# Patient Record
Sex: Female | Born: 1940 | Race: White | Hispanic: No | Marital: Married | Smoking: Never smoker
Health system: Southern US, Community
[De-identification: ages and names within clinical notes are randomized; demographics above are authoritative.]

## PROBLEM LIST (undated history)

## (undated) DIAGNOSIS — B029 Zoster without complications: Secondary | ICD-10-CM

## (undated) DIAGNOSIS — R112 Nausea with vomiting, unspecified: Secondary | ICD-10-CM

## (undated) DIAGNOSIS — R200 Anesthesia of skin: Secondary | ICD-10-CM

## (undated) DIAGNOSIS — Z96659 Presence of unspecified artificial knee joint: Secondary | ICD-10-CM

## (undated) DIAGNOSIS — R894 Abnormal immunological findings in specimens from other organs, systems and tissues: Secondary | ICD-10-CM

## (undated) DIAGNOSIS — G8929 Other chronic pain: Secondary | ICD-10-CM

## (undated) DIAGNOSIS — Z8619 Personal history of other infectious and parasitic diseases: Secondary | ICD-10-CM

## (undated) DIAGNOSIS — R32 Unspecified urinary incontinence: Secondary | ICD-10-CM

## (undated) DIAGNOSIS — M2170 Unequal limb length (acquired), unspecified site: Secondary | ICD-10-CM

## (undated) DIAGNOSIS — O021 Missed abortion: Secondary | ICD-10-CM

## (undated) DIAGNOSIS — Z5189 Encounter for other specified aftercare: Secondary | ICD-10-CM

## (undated) DIAGNOSIS — E785 Hyperlipidemia, unspecified: Secondary | ICD-10-CM

## (undated) DIAGNOSIS — R5382 Chronic fatigue, unspecified: Secondary | ICD-10-CM

## (undated) DIAGNOSIS — N8110 Cystocele, unspecified: Secondary | ICD-10-CM

## (undated) DIAGNOSIS — H9319 Tinnitus, unspecified ear: Secondary | ICD-10-CM

## (undated) DIAGNOSIS — I10 Essential (primary) hypertension: Secondary | ICD-10-CM

## (undated) DIAGNOSIS — H269 Unspecified cataract: Secondary | ICD-10-CM

## (undated) DIAGNOSIS — R2689 Other abnormalities of gait and mobility: Secondary | ICD-10-CM

## (undated) DIAGNOSIS — G709 Myoneural disorder, unspecified: Secondary | ICD-10-CM

## (undated) DIAGNOSIS — IMO0002 Reserved for concepts with insufficient information to code with codable children: Secondary | ICD-10-CM

## (undated) DIAGNOSIS — M65332 Trigger finger, left middle finger: Secondary | ICD-10-CM

## (undated) DIAGNOSIS — T7840XA Allergy, unspecified, initial encounter: Secondary | ICD-10-CM

## (undated) DIAGNOSIS — Z780 Asymptomatic menopausal state: Secondary | ICD-10-CM

## (undated) DIAGNOSIS — J40 Bronchitis, not specified as acute or chronic: Secondary | ICD-10-CM

## (undated) DIAGNOSIS — J329 Chronic sinusitis, unspecified: Secondary | ICD-10-CM

## (undated) DIAGNOSIS — M2142 Flat foot [pes planus] (acquired), left foot: Secondary | ICD-10-CM

## (undated) DIAGNOSIS — L500 Allergic urticaria: Secondary | ICD-10-CM

## (undated) DIAGNOSIS — M7742 Metatarsalgia, left foot: Secondary | ICD-10-CM

## (undated) DIAGNOSIS — M1990 Unspecified osteoarthritis, unspecified site: Secondary | ICD-10-CM

## (undated) DIAGNOSIS — M109 Gout, unspecified: Secondary | ICD-10-CM

## (undated) DIAGNOSIS — D649 Anemia, unspecified: Secondary | ICD-10-CM

## (undated) DIAGNOSIS — H3530 Unspecified macular degeneration: Secondary | ICD-10-CM

## (undated) DIAGNOSIS — G9332 Myalgic encephalomyelitis/chronic fatigue syndrome: Secondary | ICD-10-CM

## (undated) DIAGNOSIS — S60222A Contusion of left hand, initial encounter: Secondary | ICD-10-CM

## (undated) DIAGNOSIS — Z9889 Other specified postprocedural states: Secondary | ICD-10-CM

## (undated) DIAGNOSIS — T84038A Mechanical loosening of other internal prosthetic joint, initial encounter: Secondary | ICD-10-CM

## (undated) DIAGNOSIS — J189 Pneumonia, unspecified organism: Secondary | ICD-10-CM

## (undated) DIAGNOSIS — J302 Other seasonal allergic rhinitis: Secondary | ICD-10-CM

## (undated) DIAGNOSIS — M2020 Hallux rigidus, unspecified foot: Secondary | ICD-10-CM

## (undated) DIAGNOSIS — S92309A Fracture of unspecified metatarsal bone(s), unspecified foot, initial encounter for closed fracture: Secondary | ICD-10-CM

## (undated) DIAGNOSIS — M8580 Other specified disorders of bone density and structure, unspecified site: Secondary | ICD-10-CM

## (undated) DIAGNOSIS — E039 Hypothyroidism, unspecified: Secondary | ICD-10-CM

## (undated) HISTORY — PX: BACK SURGERY: SHX140

## (undated) HISTORY — PX: TUBAL LIGATION: SHX77

## (undated) HISTORY — PX: TOTAL KNEE ARTHROPLASTY: SHX125

## (undated) HISTORY — PX: BUNIONECTOMY: SHX129

## (undated) HISTORY — PX: OTHER SURGICAL HISTORY: SHX169

## (undated) HISTORY — DX: Encounter for other specified aftercare: Z51.89

## (undated) HISTORY — PX: APPENDECTOMY: SHX54

## (undated) HISTORY — PX: CARPAL TUNNEL RELEASE: SHX101

## (undated) HISTORY — DX: Gout, unspecified: M10.9

## (undated) HISTORY — PX: JOINT REPLACEMENT: SHX530

## (undated) HISTORY — PX: DILATION AND CURETTAGE OF UTERUS: SHX78

## (undated) HISTORY — PX: BREAST SURGERY: SHX581

## (undated) HISTORY — DX: Unspecified macular degeneration: H35.30

## (undated) HISTORY — DX: Hyperlipidemia, unspecified: E78.5

## (undated) HISTORY — DX: Unspecified cataract: H26.9

## (undated) HISTORY — DX: Allergy, unspecified, initial encounter: T78.40XA

## (undated) HISTORY — DX: Other specified disorders of bone density and structure, unspecified site: M85.80

---

## 1996-12-06 HISTORY — PX: BREAST EXCISIONAL BIOPSY: SUR124

## 1998-10-09 ENCOUNTER — Ambulatory Visit (HOSPITAL_COMMUNITY): Admission: RE | Admit: 1998-10-09 | Discharge: 1998-10-09 | Payer: Self-pay

## 1998-10-09 ENCOUNTER — Encounter: Payer: Self-pay | Admitting: Family Medicine

## 1999-05-07 HISTORY — PX: LUMBAR LAMINECTOMY: SHX95

## 1999-05-25 ENCOUNTER — Inpatient Hospital Stay (HOSPITAL_COMMUNITY): Admission: RE | Admit: 1999-05-25 | Discharge: 1999-05-31 | Payer: Self-pay | Admitting: Neurosurgery

## 1999-06-08 ENCOUNTER — Emergency Department (HOSPITAL_COMMUNITY): Admission: EM | Admit: 1999-06-08 | Discharge: 1999-06-08 | Payer: Self-pay | Admitting: Emergency Medicine

## 1999-06-29 ENCOUNTER — Inpatient Hospital Stay (HOSPITAL_COMMUNITY): Admission: AD | Admit: 1999-06-29 | Discharge: 1999-07-03 | Payer: Self-pay | Admitting: Neurosurgery

## 1999-10-20 ENCOUNTER — Other Ambulatory Visit: Admission: RE | Admit: 1999-10-20 | Discharge: 1999-10-20 | Payer: Self-pay | Admitting: Obstetrics and Gynecology

## 1999-11-13 ENCOUNTER — Ambulatory Visit (HOSPITAL_COMMUNITY): Admission: RE | Admit: 1999-11-13 | Discharge: 1999-11-13 | Payer: Self-pay | Admitting: Obstetrics and Gynecology

## 1999-11-13 ENCOUNTER — Encounter: Payer: Self-pay | Admitting: Obstetrics and Gynecology

## 2000-02-03 ENCOUNTER — Encounter (INDEPENDENT_AMBULATORY_CARE_PROVIDER_SITE_OTHER): Payer: Self-pay

## 2000-02-03 ENCOUNTER — Other Ambulatory Visit: Admission: RE | Admit: 2000-02-03 | Discharge: 2000-02-03 | Payer: Self-pay | Admitting: Obstetrics and Gynecology

## 2000-03-15 ENCOUNTER — Encounter: Admission: RE | Admit: 2000-03-15 | Discharge: 2000-03-15 | Payer: Self-pay | Admitting: Neurosurgery

## 2000-03-30 ENCOUNTER — Inpatient Hospital Stay (HOSPITAL_COMMUNITY): Admission: RE | Admit: 2000-03-30 | Discharge: 2000-04-05 | Payer: Self-pay | Admitting: Neurosurgery

## 2000-03-30 ENCOUNTER — Encounter (INDEPENDENT_AMBULATORY_CARE_PROVIDER_SITE_OTHER): Payer: Self-pay | Admitting: *Deleted

## 2000-04-20 ENCOUNTER — Encounter: Admission: RE | Admit: 2000-04-20 | Discharge: 2000-04-20 | Payer: Self-pay | Admitting: Neurosurgery

## 2000-06-10 ENCOUNTER — Encounter: Admission: RE | Admit: 2000-06-10 | Discharge: 2000-06-10 | Payer: Self-pay | Admitting: Neurosurgery

## 2000-08-09 ENCOUNTER — Encounter: Admission: RE | Admit: 2000-08-09 | Discharge: 2000-08-09 | Payer: Self-pay | Admitting: Neurosurgery

## 2000-09-08 ENCOUNTER — Encounter: Admission: RE | Admit: 2000-09-08 | Discharge: 2000-09-08 | Payer: Self-pay | Admitting: Obstetrics and Gynecology

## 2000-09-08 ENCOUNTER — Encounter: Payer: Self-pay | Admitting: Obstetrics and Gynecology

## 2000-09-13 ENCOUNTER — Encounter: Admission: RE | Admit: 2000-09-13 | Discharge: 2000-09-13 | Payer: Self-pay | Admitting: Neurosurgery

## 2000-10-10 ENCOUNTER — Other Ambulatory Visit: Admission: RE | Admit: 2000-10-10 | Discharge: 2000-10-10 | Payer: Self-pay | Admitting: Obstetrics and Gynecology

## 2000-11-01 ENCOUNTER — Encounter: Admission: RE | Admit: 2000-11-01 | Discharge: 2000-11-01 | Payer: Self-pay | Admitting: Neurosurgery

## 2000-11-14 ENCOUNTER — Encounter: Payer: Self-pay | Admitting: Obstetrics and Gynecology

## 2000-11-14 ENCOUNTER — Ambulatory Visit (HOSPITAL_COMMUNITY): Admission: RE | Admit: 2000-11-14 | Discharge: 2000-11-14 | Payer: Self-pay | Admitting: Obstetrics and Gynecology

## 2000-12-09 ENCOUNTER — Encounter: Payer: Self-pay | Admitting: Neurosurgery

## 2000-12-09 ENCOUNTER — Ambulatory Visit (HOSPITAL_COMMUNITY): Admission: RE | Admit: 2000-12-09 | Discharge: 2000-12-09 | Payer: Self-pay | Admitting: Neurosurgery

## 2001-04-14 ENCOUNTER — Encounter: Admission: RE | Admit: 2001-04-14 | Discharge: 2001-04-14 | Payer: Self-pay | Admitting: Neurosurgery

## 2001-06-23 ENCOUNTER — Encounter: Admission: RE | Admit: 2001-06-23 | Discharge: 2001-08-05 | Payer: Self-pay | Admitting: Anesthesiology

## 2001-07-18 ENCOUNTER — Encounter: Admission: RE | Admit: 2001-07-18 | Discharge: 2001-07-18 | Payer: Self-pay | Admitting: Neurosurgery

## 2001-08-04 ENCOUNTER — Ambulatory Visit (HOSPITAL_COMMUNITY): Admission: RE | Admit: 2001-08-04 | Discharge: 2001-08-04 | Payer: Self-pay | Admitting: Neurosurgery

## 2001-11-10 ENCOUNTER — Other Ambulatory Visit: Admission: RE | Admit: 2001-11-10 | Discharge: 2001-11-10 | Payer: Self-pay | Admitting: Obstetrics and Gynecology

## 2002-01-04 ENCOUNTER — Ambulatory Visit (HOSPITAL_COMMUNITY): Admission: RE | Admit: 2002-01-04 | Discharge: 2002-01-04 | Payer: Self-pay | Admitting: Neurosurgery

## 2002-01-16 ENCOUNTER — Encounter: Payer: Self-pay | Admitting: Obstetrics and Gynecology

## 2002-01-16 ENCOUNTER — Ambulatory Visit (HOSPITAL_COMMUNITY): Admission: RE | Admit: 2002-01-16 | Discharge: 2002-01-16 | Payer: Self-pay | Admitting: Obstetrics and Gynecology

## 2002-02-19 ENCOUNTER — Ambulatory Visit (HOSPITAL_COMMUNITY): Admission: RE | Admit: 2002-02-19 | Discharge: 2002-02-19 | Payer: Self-pay | Admitting: Neurosurgery

## 2002-11-19 ENCOUNTER — Other Ambulatory Visit: Admission: RE | Admit: 2002-11-19 | Discharge: 2002-11-19 | Payer: Self-pay | Admitting: Obstetrics and Gynecology

## 2002-12-11 ENCOUNTER — Encounter: Admission: RE | Admit: 2002-12-11 | Discharge: 2002-12-11 | Payer: Self-pay | Admitting: Obstetrics and Gynecology

## 2002-12-11 ENCOUNTER — Encounter: Payer: Self-pay | Admitting: Obstetrics and Gynecology

## 2003-01-17 ENCOUNTER — Ambulatory Visit (HOSPITAL_COMMUNITY): Admission: RE | Admit: 2003-01-17 | Discharge: 2003-01-17 | Payer: Self-pay | Admitting: Obstetrics and Gynecology

## 2003-01-17 ENCOUNTER — Encounter: Payer: Self-pay | Admitting: Obstetrics and Gynecology

## 2004-05-12 ENCOUNTER — Ambulatory Visit (HOSPITAL_COMMUNITY): Admission: RE | Admit: 2004-05-12 | Discharge: 2004-05-12 | Payer: Self-pay | Admitting: Anesthesiology

## 2004-08-03 ENCOUNTER — Other Ambulatory Visit: Admission: RE | Admit: 2004-08-03 | Discharge: 2004-08-03 | Payer: Self-pay | Admitting: Obstetrics and Gynecology

## 2004-08-20 ENCOUNTER — Ambulatory Visit (HOSPITAL_COMMUNITY): Admission: RE | Admit: 2004-08-20 | Discharge: 2004-08-20 | Payer: Self-pay | Admitting: Obstetrics and Gynecology

## 2004-11-24 ENCOUNTER — Observation Stay (HOSPITAL_COMMUNITY): Admission: EM | Admit: 2004-11-24 | Discharge: 2004-11-25 | Payer: Self-pay | Admitting: *Deleted

## 2004-11-25 ENCOUNTER — Encounter (INDEPENDENT_AMBULATORY_CARE_PROVIDER_SITE_OTHER): Payer: Self-pay | Admitting: *Deleted

## 2005-02-15 ENCOUNTER — Ambulatory Visit (HOSPITAL_COMMUNITY): Admission: RE | Admit: 2005-02-15 | Discharge: 2005-02-15 | Payer: Self-pay | Admitting: *Deleted

## 2005-02-15 ENCOUNTER — Encounter (INDEPENDENT_AMBULATORY_CARE_PROVIDER_SITE_OTHER): Payer: Self-pay | Admitting: *Deleted

## 2005-05-24 ENCOUNTER — Ambulatory Visit (HOSPITAL_COMMUNITY): Admission: RE | Admit: 2005-05-24 | Discharge: 2005-05-24 | Payer: Self-pay | Admitting: *Deleted

## 2005-11-03 ENCOUNTER — Inpatient Hospital Stay (HOSPITAL_COMMUNITY): Admission: RE | Admit: 2005-11-03 | Discharge: 2005-11-04 | Payer: Self-pay | Admitting: Orthopedic Surgery

## 2005-11-18 ENCOUNTER — Other Ambulatory Visit: Admission: RE | Admit: 2005-11-18 | Discharge: 2005-11-18 | Payer: Self-pay | Admitting: Obstetrics and Gynecology

## 2005-11-18 ENCOUNTER — Ambulatory Visit (HOSPITAL_COMMUNITY): Admission: RE | Admit: 2005-11-18 | Discharge: 2005-11-18 | Payer: Self-pay | Admitting: Obstetrics and Gynecology

## 2005-12-06 HISTORY — PX: KNEE ARTHROSCOPY: SUR90

## 2006-09-20 ENCOUNTER — Encounter: Admission: RE | Admit: 2006-09-20 | Discharge: 2006-09-20 | Payer: Self-pay | Admitting: Endocrinology

## 2006-12-01 ENCOUNTER — Ambulatory Visit (HOSPITAL_COMMUNITY): Admission: RE | Admit: 2006-12-01 | Discharge: 2006-12-01 | Payer: Self-pay | Admitting: Obstetrics & Gynecology

## 2006-12-06 HISTORY — PX: THUMB FUSION: SUR636

## 2006-12-30 ENCOUNTER — Other Ambulatory Visit: Admission: RE | Admit: 2006-12-30 | Discharge: 2006-12-30 | Payer: Self-pay | Admitting: Obstetrics & Gynecology

## 2007-02-12 ENCOUNTER — Emergency Department (HOSPITAL_COMMUNITY): Admission: EM | Admit: 2007-02-12 | Discharge: 2007-02-12 | Payer: Self-pay | Admitting: Emergency Medicine

## 2007-12-06 ENCOUNTER — Ambulatory Visit (HOSPITAL_COMMUNITY): Admission: RE | Admit: 2007-12-06 | Discharge: 2007-12-06 | Payer: Self-pay | Admitting: Obstetrics & Gynecology

## 2008-02-09 ENCOUNTER — Other Ambulatory Visit: Admission: RE | Admit: 2008-02-09 | Discharge: 2008-02-09 | Payer: Self-pay | Admitting: Obstetrics & Gynecology

## 2008-07-24 ENCOUNTER — Encounter (INDEPENDENT_AMBULATORY_CARE_PROVIDER_SITE_OTHER): Payer: Self-pay | Admitting: Neurosurgery

## 2008-07-24 ENCOUNTER — Inpatient Hospital Stay (HOSPITAL_COMMUNITY): Admission: AD | Admit: 2008-07-24 | Discharge: 2008-07-25 | Payer: Self-pay | Admitting: Neurosurgery

## 2008-10-11 ENCOUNTER — Encounter: Admission: RE | Admit: 2008-10-11 | Discharge: 2008-10-11 | Payer: Self-pay | Admitting: Endocrinology

## 2008-12-06 DIAGNOSIS — IMO0001 Reserved for inherently not codable concepts without codable children: Secondary | ICD-10-CM

## 2008-12-06 HISTORY — DX: Reserved for inherently not codable concepts without codable children: IMO0001

## 2009-02-14 ENCOUNTER — Other Ambulatory Visit: Admission: RE | Admit: 2009-02-14 | Discharge: 2009-02-14 | Payer: Self-pay | Admitting: Obstetrics & Gynecology

## 2009-07-24 ENCOUNTER — Inpatient Hospital Stay (HOSPITAL_COMMUNITY): Admission: RE | Admit: 2009-07-24 | Discharge: 2009-07-29 | Payer: Self-pay | Admitting: Neurosurgery

## 2010-03-10 ENCOUNTER — Encounter: Admission: RE | Admit: 2010-03-10 | Discharge: 2010-03-10 | Payer: Self-pay | Admitting: Obstetrics & Gynecology

## 2010-09-10 ENCOUNTER — Encounter: Admission: RE | Admit: 2010-09-10 | Discharge: 2010-09-10 | Payer: Self-pay | Admitting: Obstetrics & Gynecology

## 2010-11-17 ENCOUNTER — Ambulatory Visit (HOSPITAL_COMMUNITY)
Admission: RE | Admit: 2010-11-17 | Discharge: 2010-11-17 | Payer: Self-pay | Source: Home / Self Care | Attending: Neurosurgery | Admitting: Neurosurgery

## 2010-12-06 HISTORY — PX: OTHER SURGICAL HISTORY: SHX169

## 2010-12-27 ENCOUNTER — Encounter: Payer: Self-pay | Admitting: Endocrinology

## 2011-02-04 HISTORY — PX: METATARSAL OSTEOTOMY: SHX1641

## 2011-02-26 ENCOUNTER — Encounter (HOSPITAL_BASED_OUTPATIENT_CLINIC_OR_DEPARTMENT_OTHER)
Admission: RE | Admit: 2011-02-26 | Discharge: 2011-02-26 | Disposition: A | Discharge status: Home / Self Care | Payer: Medicare Other | Source: Ambulatory Visit | Attending: Orthopaedic Surgery | Admitting: Orthopaedic Surgery

## 2011-02-26 LAB — BASIC METABOLIC PANEL
BUN: 17 mg/dL (ref 6–23)
CO2: 31 mEq/L (ref 19–32)
Chloride: 103 mEq/L (ref 96–112)
GFR calc non Af Amer: >60 mL/min (ref >60)
Glucose, Bld: 120 mg/dL — ABNORMAL HIGH (ref 70–99)
Potassium: 3.5 mEq/L (ref 3.5–5.1)
Sodium: 140 mEq/L (ref 135–145)

## 2011-03-02 ENCOUNTER — Ambulatory Visit (HOSPITAL_BASED_OUTPATIENT_CLINIC_OR_DEPARTMENT_OTHER)
Admission: RE | Admit: 2011-03-02 | Discharge: 2011-03-02 | Disposition: A | Discharge status: Home / Self Care | Payer: Medicare Other | Source: Ambulatory Visit | Attending: Orthopaedic Surgery | Admitting: Orthopaedic Surgery

## 2011-03-02 DIAGNOSIS — Z01818 Encounter for other preprocedural examination: Secondary | ICD-10-CM | POA: Insufficient documentation

## 2011-03-02 DIAGNOSIS — M2010 Hallux valgus (acquired), unspecified foot: Secondary | ICD-10-CM | POA: Insufficient documentation

## 2011-03-07 HISTORY — PX: HAMMER TOE SURGERY: SHX385

## 2011-03-13 LAB — URINE CULTURE: Culture: NO GROWTH

## 2011-03-13 LAB — URINALYSIS, ROUTINE W REFLEX MICROSCOPIC
Bilirubin Urine: NEGATIVE
Hgb urine dipstick: NEGATIVE
Nitrite: NEGATIVE
Specific Gravity, Urine: 1.005 (ref 1.005–1.030)
Urobilinogen, UA: 0.2 mg/dL (ref 0.0–1.0)
pH: 7.5 (ref 5.0–8.0)

## 2011-03-13 LAB — CBC
HCT: 27.5 % — ABNORMAL LOW (ref 36.0–46.0)
HCT: 30.7 % — ABNORMAL LOW (ref 36.0–46.0)
HCT: 37.5 % (ref 36.0–46.0)
Hemoglobin: 12.9 g/dL (ref 12.0–15.0)
Hemoglobin: 9 g/dL — ABNORMAL LOW (ref 12.0–15.0)
Hemoglobin: 9.7 g/dL — ABNORMAL LOW (ref 12.0–15.0)
MCHC: 35.2 g/dL (ref 30.0–36.0)
MCHC: 35.2 g/dL (ref 30.0–36.0)
MCV: 95.3 fL (ref 78.0–100.0)
MCV: 95.9 fL (ref 78.0–100.0)
MCV: 96 fL (ref 78.0–100.0)
Platelets: 169 10*3/uL (ref 150–400)
RBC: 2.66 MIL/uL — ABNORMAL LOW (ref 3.87–5.11)
RDW: 12.3 % (ref 11.5–15.5)
RDW: 12.7 % (ref 11.5–15.5)
WBC: 5.8 10*3/uL (ref 4.0–10.5)

## 2011-03-13 LAB — COMPREHENSIVE METABOLIC PANEL
Alkaline Phosphatase: 64 U/L (ref 39–117)
BUN: 18 mg/dL (ref 6–23)
Chloride: 105 mEq/L (ref 96–112)
Creatinine, Ser: 0.81 mg/dL (ref 0.4–1.2)
Glucose, Bld: 93 mg/dL (ref 70–99)
Potassium: 4.2 mEq/L (ref 3.5–5.1)
Total Bilirubin: 0.7 mg/dL (ref 0.3–1.2)
Total Protein: 6.6 g/dL (ref 6.0–8.3)

## 2011-03-13 LAB — DIFFERENTIAL
Basophils Absolute: 0 10*3/uL (ref 0.0–0.1)
Eosinophils Absolute: 0.2 10*3/uL (ref 0.0–0.7)
Eosinophils Relative: 3 % (ref 0–5)
Lymphocytes Relative: 14 % (ref 12–46)
Lymphs Abs: 1.4 10*3/uL (ref 0.7–4.0)
Monocytes Absolute: 0.6 10*3/uL (ref 0.1–1.0)
Monocytes Absolute: 0.6 10*3/uL (ref 0.1–1.0)
Monocytes Relative: 11 % (ref 3–12)
Neutro Abs: 3.3 10*3/uL (ref 1.7–7.7)
Neutrophils Relative %: 57 % (ref 43–77)

## 2011-03-13 LAB — BASIC METABOLIC PANEL
CO2: 29 mEq/L (ref 19–32)
Calcium: 8.5 mg/dL (ref 8.4–10.5)
Creatinine, Ser: 0.75 mg/dL (ref 0.4–1.2)
Glucose, Bld: 145 mg/dL — ABNORMAL HIGH (ref 70–99)

## 2011-03-13 LAB — TYPE AND SCREEN
ABO/RH(D): O POS
Antibody Screen: NEGATIVE

## 2011-03-13 NOTE — Op Note (Signed)
NAME:  [name] [name], [name] [name]                    ACCOUNT NO.:  [id number]  MEDICAL RECORD NO.:  [id number]          PATIENT TYPE:  AMB  LOCATION:  SDS                          FACILITY:  [facility]  PHYSICIAN:  Lubertha Basque. Mikayla Chiusano, M.D.DATE OF BIRTH:  1941-05-29  DATE OF PROCEDURE:  03/02/2011 DATE OF DISCHARGE:  11/17/2010                              OPERATIVE REPORT   PREOPERATIVE DIAGNOSIS:  Left foot, severe hallux valgus.  POSTOPERATIVE DIAGNOSIS:  Left foot, severe hallux valgus.  PROCEDURES: 1. Left foot proximal metatarsal osteotomy. 2. Left foot hallux valgus correction.  ANESTHESIA:  General and block.  ATTENDING SURGEON:  Lubertha Basque. Jerl Santos, MD  ASSISTANT:  Lindwood Qua, PA   INDICATIONS FOR PROCEDURE:  The patient is a 70 year old woman with a long history of left foot deformity.  She has failed various shoes and pads and has some severe hallux valgus which she would like corrected. Informed operative consent was obtained for hallux valgus correction with a proximal metatarsal osteotomy.  Discussion about possible complications including reaction to anesthesia, infection, and failure of fixation was gone through with the patient.  SUMMARY, FINDINGS, AND PROCEDURE:  Under general anesthesia and a block through a dorsomedial incision, a severe hallux valgus deformity was addressed with a proximal metatarsal osteotomy stabilized with a DePuy hand innovations plate.  We then performed the distal correction with a bunionectomy and capsular repair and also release the tight adductor and capsule through a first interspace incision.  I used fluoroscopy throughout the case to make appropriate intraoperative decisions and read all these views myself.  Lindwood Qua assisted throughout and was invaluable to the completion of the case in that he helped position and retract and maintain reduction of osteotomy while I placed hardware and performed the procedure.  DESCRIPTION OF  PROCEDURE:  The patient was taken to the operating suite where general anesthetic was applied without difficulty.  She was also given a block in the preanesthesia area.  She was positioned supine and prepped and draped in normal sterile fashion.  After administration of IV Kefzol, the left leg was elevated, exsanguinated, and tourniquet inflated about the calf.  A dorsomedial incision was made along the first metatarsal.  A medial incision was made in the periosteum along most of the shaft of the metatarsal with a Y-shaped incision with a flap based distally at the MTP joint.  The metatarsal was exposed at the base.  Fluoroscopy was used to confirm the joint of the base of the first metatarsal, then I made a pie-shaped osteotomy removing a lateral wedge of bone and correcting the deformity of the first metatarsal.  We elected to stabilize this with the hand innovations, small fragment set plate.  She seemed to have excellent bone quality.  We then performed the bunionectomy and I also performed a release in the first interspace releasing the tight adductor and capsule on that aspect of the joint. We then repaired the capsule laterally correcting much deformity.  The wound was irrigated and the tourniquet was deflated.  Skin became pink and warm immediately.  We then repaired the rest of the  capsule and periosteum with Vicryl and skin with nylon in vertical mattress fashion. Adaptic was applied to the various wounds followed by dry gauze and loose Ace wrap.  Estimated blood loss and fluids obtained from anesthesia records as can accurate tourniquet time.  DISPOSITION:  The patient was extubated in the operating room and taken to recovery room in stable addition.  She is to go home same-day and follow up in the office closely.  I will contact her by phone tonight.     Lubertha Basque Jerl Santos, M.D.     PGD/MEDQ  D:  03/02/2011  T:  03/02/2011  Job:  [id number]  Electronically Signed by Marcene Corning M.D. on 03/13/2011 01:10:35 PM

## 2011-03-23 ENCOUNTER — Other Ambulatory Visit: Payer: Self-pay | Admitting: Obstetrics & Gynecology

## 2011-03-23 DIAGNOSIS — Z1231 Encounter for screening mammogram for malignant neoplasm of breast: Secondary | ICD-10-CM

## 2011-03-27 ENCOUNTER — Other Ambulatory Visit: Payer: Self-pay | Admitting: Gastroenterology

## 2011-04-01 ENCOUNTER — Ambulatory Visit (HOSPITAL_BASED_OUTPATIENT_CLINIC_OR_DEPARTMENT_OTHER)
Admission: RE | Admit: 2011-04-01 | Discharge: 2011-04-01 | Disposition: A | Discharge status: Home / Self Care | Payer: Medicare Other | Source: Ambulatory Visit | Attending: Orthopaedic Surgery | Admitting: Orthopaedic Surgery

## 2011-04-01 DIAGNOSIS — K219 Gastro-esophageal reflux disease without esophagitis: Secondary | ICD-10-CM | POA: Insufficient documentation

## 2011-04-01 DIAGNOSIS — I10 Essential (primary) hypertension: Secondary | ICD-10-CM | POA: Insufficient documentation

## 2011-04-01 DIAGNOSIS — E669 Obesity, unspecified: Secondary | ICD-10-CM | POA: Insufficient documentation

## 2011-04-01 DIAGNOSIS — Z01812 Encounter for preprocedural laboratory examination: Secondary | ICD-10-CM | POA: Insufficient documentation

## 2011-04-01 DIAGNOSIS — M2040 Other hammer toe(s) (acquired), unspecified foot: Secondary | ICD-10-CM | POA: Insufficient documentation

## 2011-04-01 DIAGNOSIS — G8929 Other chronic pain: Secondary | ICD-10-CM | POA: Insufficient documentation

## 2011-04-01 DIAGNOSIS — E039 Hypothyroidism, unspecified: Secondary | ICD-10-CM | POA: Insufficient documentation

## 2011-04-01 LAB — POCT I-STAT, CHEM 8
BUN: 17 mg/dL (ref 6–23)
Calcium, Ion: 1.03 mmol/L — ABNORMAL LOW (ref 1.12–1.32)
Chloride: 104 mEq/L (ref 96–112)
Creatinine, Ser: 0.8 mg/dL (ref 0.4–1.2)
Glucose, Bld: 105 mg/dL — ABNORMAL HIGH (ref 70–99)
Potassium: 3.8 mEq/L (ref 3.5–5.1)

## 2011-04-05 ENCOUNTER — Ambulatory Visit
Admission: RE | Admit: 2011-04-05 | Discharge: 2011-04-05 | Disposition: A | Discharge status: Home / Self Care | Payer: Medicare Other | Source: Ambulatory Visit | Attending: Obstetrics & Gynecology | Admitting: Obstetrics & Gynecology

## 2011-04-05 DIAGNOSIS — Z1231 Encounter for screening mammogram for malignant neoplasm of breast: Secondary | ICD-10-CM

## 2011-04-06 NOTE — Op Note (Signed)
NAME:  [name] [name], [name] [name]                    ACCOUNT NO.:  [id number]  MEDICAL RECORD NO.:  [id number]          PATIENT TYPE:  AMB  LOCATION:  SDS                          FACILITY:  [facility]  PHYSICIAN:  Lubertha Basque. Olean Sangster, M.D.DATE OF BIRTH:  11-02-1941  DATE OF PROCEDURE:  04/01/2011 DATE OF DISCHARGE:  11/17/2010                              OPERATIVE REPORT   PREOPERATIVE DIAGNOSIS:  Left second and third hammertoes.  POSTOPERATIVE DIAGNOSIS:  Left second and third hammertoes.  PROCEDURE:  Left second and third hammertoe corrections.  ANESTHESIA:  General and block.  ATTENDING SURGEON:  Lubertha Basque. Tracyann Duffell, MD   INDICATIONS FOR PROCEDURE:  The patient is a 70 year old woman with a long history of left foot difficulty.  She is about a month out from a severe hallux valgus correction done with a proximal osteotomy.  She persists with trouble related to a rigid second and flexible third hammertoe.  This continues to bother her and she is offered hammertoe correction.  Informed operative consent was obtained after discussion of possible complications including reaction to anesthesia and infection.  SUMMARY OF FINDINGS AND PROCEDURE:  Under general anesthesia and a popliteal block, a set of left foot procedures were performed.  We performed hammertoe corrections at II and III performing PIP resection arthroplasties.  I stabilized with longitudinal K-wires.  I used fluoroscopy throughout the case to make appropriate intraoperative visions and read all these views myself.  DESCRIPTION OF PROCEDURE:  The patient was taken to the operating suite where general anesthetic was applied without difficulty.  She was also given a block in the preanesthesia area.  She was positioned supine and prepped and draped in normal sterile fashion.  After administration of IV Kefzol, the left leg was elevated, exsanguinated, and tourniquet placed about the calf.  A longitudinal incision was made on the  dorsal aspect of the PIP joint of the second toe with dissection down to the extensor tendon.  This was split longitudinally to expose the distal portion of the proximal phalanx.  Used a saw to remove the condyles roughly flat.  I then placed a K-wire from proximal to distal of the tip of the toe and then backed retrograde through the intramedullary canal of the proximal phalanx.  I then repeated this procedure on the third toe in an identical fashion again using a 0.045 K-wire.  Fluoroscopy was used to confirm adequate placement of the hardware.  The pins were cut outside the tip of the toe.  Both toes were irrigated followed by reapproximation of the extensor mechanism side-to-side with Vicryl and skin closure with nylon.  The tourniquet was deflated and a small amount of bleeding was easily controlled with some pressure.  We attempted to put some pin caps in place and I applied a digital block with about 8 mL of Marcaine at both toes total.  We then placed a sterile dressing with a loose Ace wrap.  Estimated blood loss and intraoperative fluids can be obtained from anesthesia records as can accurate tourniquet time.  DISPOSITION:  The patient was extubated in the operating room and taken  to recovery room in stable addition.  She was to go home the same day and follow up in the office in less than a week.  I will contact her by phone tonight.     Lubertha Basque Jerl Santos, M.D.     PGD/MEDQ  D:  04/01/2011  T:  04/02/2011  Job:  [id number]  Electronically Signed by Marcene Corning M.D. on 04/06/2011 02:12:02 PM

## 2011-04-20 NOTE — Op Note (Signed)
NAME:  [name] [name], [name] [name]                    ACCOUNT NO.:  [id number]   MEDICAL RECORD NO.:  [id number]          PATIENT TYPE:  INP   LOCATION:  3022                         FACILITY:  [facility]   PHYSICIAN:  Cristi Loron, M.D.DATE OF BIRTH:  08-Mar-1941   DATE OF PROCEDURE:  07/24/2009  DATE OF DISCHARGE:                               OPERATIVE REPORT   BRIEF HISTORY:  The patient is a 70 year old white female who I  performed a L4-5 vertebral fusion many years ago.  The patient had a  chronic lumbago and has been managed medically.  More recently, the  patient has been involved in a work-related injury and suffered a lumbar  radiculopathy with stenosis and synovial cyst at L5-S1.  I performed a  left L5-S1 laminotomy and foraminotomy to remove that synovial cyst a  year ago.  The patient had some improvement in her symptoms, but had  some persistent back and leg pain.  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  I have discussed the various treatment options  with the patient including surgery.  She has weighed the risks,  benefits, and alternatives of surgery and would like to proceed with her  exploration of a lumbar fusion as well as a L5-S1 decompression,  instrumentation, fusion, and possible extension of fusion of the L4-5.   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   PROCEDURES:  Exploration of lumbar fusion; bilateral L5 laminotomies,  foraminotomies to decompress the bilateral L5-S1 nerve roots  (required  to do this was in addition to the work required to do the posterior  lumbar interbody fusion because of patient's severe facet arthropathy  and foraminal stenosis); L5-S1 posterior lumbar interbody fusion with  local morselized autograft bone and Actifuse bone graft extender;  insertion of L5-S1 interbody prosthesis ([employer] PEEK interbody  prosthesis); L4-S1 bilateral pedicle screws instrumentation; L4-5 and L5-  S1 posterolateral arthrodesis with local morselized autograft bone of  Vitoss and Actifuse bone graft extender, and bone morphogenic protein.   SURGEON:  Cristi Loron, MD   ASSISTANT:  Payton Doughty, MD   ANESTHESIA:  General endotracheal.   ESTIMATED BLOOD LOSS:  500 mL.   SPECIMENS:  None.   DRAINS.:  None.   COMPLICATIONS:  None.   DESCRIPTION OF PROCEDURE:  The patient was brought to the operating room  by the anesthesia team.  General endotracheal anesthesia was induced.  The patient was turned to the prone position on the Wilson frame and  lumbosacral region was then prepared with Betadine solution.  Sterile  drapes were applied.  I then injected the area to be incised with  Marcaine with epinephrine solution.  A scalpel to make a  linear midline  incision through the patient's previous surgical scar.  I used  electrocautery to perform a bilateral subperiosteal dissection exposing  the spinous process and lamina of L3 down the upper sacrum.  We then  obtained intraoperative radiograph and then inserted the Versa-Trac  retractor for exposure.   Because of the patient's severe lateral recess stenosis and recurrent  synovial cyst, a wide lateral decompression was required at L5-S1, which  was in addition to what required to do the interbody fusion.  We began  decompression by performing a right L5 laminotomy.  We widened the  laminotomy with Kerrison punch, removed the L5-S1 ligament flavum and  then performed wide foraminotomies about the right  L5-S1 nerve roots.  We did encounter a bit of a synovial cyst on this side as well.  We then  repeated this procedure in an analogous fashion on the left, but on that  side, she had a previous hemilaminectomy and quite a bit of epidural  scar tissue.  We carefully dissected through the epidural scar tissue,  identified the thecal sac, and the L5-S1 nerve roots on the left and  performed foraminotomies about them.  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   We now turned our attention to posterior lumbar interbody fusion.  Because of the conjoined L5-S1 nerve root on the left, we could not put  a prosthesis on the left side.  We incised the right L5-S1  intervertebral disk with #15 blade scalpel and performed a partial  intervertebral disketomy with the pituitary forceps and curettes.  We  then prepared the vertebral endplates for posterior lumbar interbody  fusion by clearing soft tissue with the curettes.  We used trial spacers  and determined to use the 12 x 26 mm PEEK interbody prosthesis.  We  prefilled this prosthesis with a combination of local morselized  autograft bone as well as Actifuse bone graft extender.  We inserted the  prosthesis into the L5-S1 interspace, of course after retracting the  neural structures out of harm's way.  We got a good snug fit of  prosthesis at interspace.  We also filled the remainder of disk spacing  with local morselized autograft bone and Actifuse bone graft extender  completing the posterior lumbar interbody fusion.   We now turned our attention to the exploration of the L4-5 fusion.  There was no obvious motion at L4-5, however, we could not be sure that  it was fused, so we decided to include it in the posterior lateral  fusion.  The remainder of the facets in L4-5 as above, I could not  determine any obvious motion, but I thought it is best to fuse it  posterolaterally as  well.   We now turned our attention to instrumentation.  Under fluoroscopic  guidance, we cannulated the bilateral L4, 5, and S1 pedicles with the  bone probe.  We tapped the pedicles with 6.5-mm tap.  We then removed  the tap and then probed inside the tapped pedicles to rule out cortical  breeches.  We then inserted a combination of 6.5 x 7.5 x 45 and 50-mm  pedicle screws bilaterally at L4, 5, and S1.  We got good bony purchase  at each level.  We then connected unilateral pedicle screws with a  lordotic rod.  We secured the rod in place with caps.  We then placed  the cross connector between the rods  and tightened it.  This completed  the instrumentation.   We now turned our attention to the posterolateral arthrodesis.  We used  high-speed drill to decorticate the remainder of the L5-S1 facets, the  L4 transverse processes, and then laid a bone morphogenic protein-soaked  collagen sponges over these decorticated posterolateral structures and  then laid local autograft bone as well as Actifuse and Vitoss over these  decorticated posterolateral structures.  This completed posterolateral  arthrodesis.   We then obtained hemostasis using bipolar cautery.  We inspected the  thecal sac and bilateral L5-S1 nerve roots that was noted to be well  decompressed.  We then removed the retractors and then reapproximated  the patient's thoracolumbar fascia with interrupted #1 Vicryl suture,  subcutaneous tissue with interrupted 2-0 Vicryl suture, and the skin  with Steri-Strips and Benzoin.  The wound was then coated with  bacitracin ointment and sterile dressings  applied.  The drapes were removed and the patient was subsequently  turned to the supine position where she was extubated by the anesthesia  team and transported to the post anesthesia care unit in stable  condition.  All sponge, instrument, and needle counts were correct at  the end of this case.      Cristi Loron, M.D.   Electronically Signed     JDJ/MEDQ  D:  07/24/2009  T:  07/25/2009  Job:  [id number]

## 2011-04-20 NOTE — Discharge Summary (Signed)
NAME:  [name] [name], [name] [name]                    ACCOUNT NO.:  [id number]   MEDICAL RECORD NO.:  [id number]          PATIENT TYPE:  INP   LOCATION:  3022                         FACILITY:  [facility]   PHYSICIAN:  Cristi Loron, M.D.DATE OF BIRTH:  16-Dec-1940   DATE OF ADMISSION:  07/24/2009  DATE OF DISCHARGE:  07/29/2009                               DISCHARGE SUMMARY   BRIEF HISTORY:  The patient is a 70 year old white female who is status  post a previous L4-5 metal cage fusion many years ago.  She has had some  persistent back pain, more recently she has increasing leg pain, was  found to have L5-S1 synovial cyst, facet arthropathy, disk herniation  etc., she decided to proceed with surgery after weighing the risks,  benefits, and alternatives of the surgery.  For further details of this  admission please refer to typed history and physical.   HOSPITAL COURSE:  Admitted the patient to [facility] on July 24, 2009.  On day of admission, I performed an exploration of L4-5  fusion with and L4-S1 instrumentation, decompression and fusion, surgery  went well (for full details of this operation, please refer to typed  operative note).   POSTOPERATIVE COURSE:  The patient's postoperative course was only  remarkable for some fevers.  We worked up with a chest x-ray,  urinalysis, etc. which all turned out okay.  We followed the patient's  white count which remained normal.  By postop day #5, i.e. July 29, 2009, the patient was afebrile for greater than 24 hours, she was  stable.  She was eating well, ambulating well and requesting discharge  to home.  Her wound was healing well.  She was therefore discharged home  on July 30, 2009.   DISCHARGE INSTRUCTIONS:  The patient was instructed to follow up with me  in 4 weeks.  She was given written discharge instructions.  She was  instructed to keep an eye on her daily medication, take her temperature  and call if she is greater than 101.   She is also noted to have a  hemoglobin down to 9.0 (acute blood loss anemia).  We instructed the  patient to take a multivitamin with iron.  All of the patient's  questions were answered.   DISCHARGE PRESCRIPTIONS:  Percocet 10/325, #100 one p.o. q.4 h. p.r.n.  pain; Valium 5 mg, #50 one p.o. q.6 h. p.r.n. muscle spasms.   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   PROCEDURE PERFORMED:  An exploration of L4-5 fusion; L5-S1 posterior  lumbar interbody fusion with local morselized autograft bone; insertion  of L5-S1 interbody prosthesis, interbody capsular and PEEK interbody  prosthesis); L4-S1 posterior segmental instrumentation with legacy  titanium pedicle screws and rods; L4-5 and L5-S1 posterolateral  arthrodesis with local morselized autograft bone, Vitoss and Actifuse  bone graft extenders as well as bone morphogenic protein.     Cristi Loron, M.D.  Electronically Signed    JDJ/MEDQ  D:  07/29/2009  T:  07/29/2009  Job:  [id number]

## 2011-04-20 NOTE — Op Note (Signed)
NAME:  [name], [name]                    ACCOUNT NO.:  [id number]   MEDICAL RECORD NO.:  [id number]          PATIENT TYPE:  INP   LOCATION:  3537                         FACILITY:  [facility]   PHYSICIAN:  Cristi Loron, M.D.DATE OF BIRTH:  01-19-1941   DATE OF PROCEDURE:  DATE OF DISCHARGE:                               OPERATIVE REPORT   BRIEF HISTORY:  The patient is a 70 year old white female who I  performed a L4-L5 fusion many years ago.  I have not seen her in quite  some time.  She had a work-related injury and suffered a back and left  leg pain.  She was worked up with lumbar MRI, which demonstrated the  patient had left L5-S1 synovial cyst with compression of left L5 and S1  nerve roots.  I discussed the various treatment option with the patient  and her husband.  She weighed the risks, benefits, and alternatives of  the surgery and decided to proceed with a left L5 hemilaminectomy to  remove the synovial cyst.   PREOPERATIVE DIAGNOSES:  Left L5-S1 facet arthropathy, slight  spondylosis; synovial cyst, spinal stenosis, lumbar  radiculopathy/myelopathy, and lumbago.   POSTOPERATIVE DIAGNOSIS:  Left L5-S1 facet arthropathy, slight  spondylosis; synovial cyst, spinal stenosis, lumbar  radiculopathy/myelopathy, and lumbago.   PROCEDURE:  Left L5 hemilaminectomy to remove synovial cyst and  decompress the left L5 and S1 nerve roots using microdissection.   SURGEON:  Cristi Loron, MD   ASSISTANT:  Coletta Memos, MD   ANESTHESIA:  General endotracheal.   ESTIMATED BLOOD LOSS:  50 mL.   SPECIMENS:  Cyst.   DRAINS:  None.   COMPLICATIONS:  None.   DESCRIPTION OF PROCEDURE:  The patient was brought to the operating room  by Anesthesia Team.  General endotracheal anesthesia was induced.  The  patient was turned to the prone position on Wilson frame.  A lumbosacral  region was then prepared with Betadine scrub and Betadine solution.  Sterile drapes were applied.  I then  injected the area to be incised  with Marcaine with epinephrine solution.  We used a scalpel to make a  linear midline incision over the L5-S1 interspace.  I used  electrocautery to perform a left-sided subperiosteal dissection exposing  left spinous process and lamina of L5, and at the sacrum.  We obtained  an intraoperative radiograph to confirm our location and then we brought  the operative microscope into the field under electromagnification and  illumination, we completed the microdissection/decompression.  I used a  high-speed drill to perform the left L5 laminotomy.  I completed the  left L5 hemilaminectomy using the Kerrison punch and removed the  ligamentum flavum in L5-S1.  We expected some epidural scar tissue just  cephalad to the L5 laminectomy site.  We removed this with Kerrison  punch carefully dissecting it from the underlying dura.  We encountered  a typical-appearing synovial cyst emanating from the left L5-S1 facet.  We removed in multiple fragments using Kerrison punch.  After using  microdissection to free it up from the underlying dura  and L5-S1 nerve  roots, the psoas cyst was compressing both the left L5 and S1 nerve  roots.  We performed foraminotomies about this both nerve roots and  removed all the synovial cyst.  We then palpated along the exit route of  the left L5 and S1 nerve roots and noted well decompressed.  We obtained  hemostasis using bipolar electrocautery, irrigated the wound with  bacitracin solution, removed the retractor, and then reapproximated the  patient's thoracolumbar fascia with interrupted #1 Vicryl suture,  subcutaneous tissue with interrupted 2-0 Vicryl suture, and the skin  with Steri-Strips and Benzoin.  The wound was then coated with  bacitracin ointment.  Sterile dressing was applied.  The drapes were  removed.  The patient was subsequently returned to supine position where  she was extubated by the Anesthesia Team and transported  to the Post  Anesthesia Care Unit in stable condition.  All sponge, instrument, and  needle counts were correct at the end of this case.      Cristi Loron, M.D.  Electronically Signed     JDJ/MEDQ  D:  07/24/2008  T:  07/25/2008  Job:  [id number]

## 2011-04-23 NOTE — Op Note (Signed)
NAME:  [name] [name], [name] [name]                    ACCOUNT NO.:  [id number]   MEDICAL RECORD NO.:  [id number]          PATIENT TYPE:  OIB   LOCATION:  1519                         FACILITY:  [facility]   PHYSICIAN:  Marlowe Kays, M.D.  DATE OF BIRTH:  November 10, 1941   DATE OF PROCEDURE:  11/02/2005  DATE OF DISCHARGE:                                 OPERATIVE REPORT   PREOPERATIVE DIAGNOSES:  1.  Torn medial and lateral menisci bilateral.  2.  osteoarthritis, knees, bilateral.   POSTOPERATIVE DIAGNOSES:  1.  Torn medial and lateral menisci bilateral.  2.  osteoarthritis, knees, bilateral.   OPERATION:  1.  Left knee arthroscopy with partial medial and lateral meniscectomies,      shaving medial femoral condyle and shaving of patella.  2.  Right knee arthroscopy with partial medial and lateral meniscectomies      shaving lateral femoral condyle and patella.   SURGEON:  Marlowe Kays, M.D.   ASSISTANT:  Nurse.   ANESTHESIA:  General.   INDICATIONS FOR PROCEDURE:  Bilateral knee pain and effusion.  X-rays  demonstrated degenerative changes.  MRIs of her niece demonstrate the above-  mentioned findings with both menisci being torn bilaterally and  osteoarthritis, tricompartmental bilaterally.   PROCEDURE:  Prophylactic antibiotics.  She has hardware in her lumbar spine.  Bilateral pneumatic tourniquets.  Both legs were Esmarch out nonsterilely.  A thigh stabilizer was applied and then both legs were prepped with DuraPrep  from stabilizer to ankle with the DuraPrep and draped in sterile fields.  Superior and medially saline bilaterally, first in the left knee.  I  visualized the medial compartment of the knee joint through an anterolateral  approach.  She had a little disruption of the anterior third pole of medial  meniscus and some grade 2/4 chondromalacia in the medial/femoral condyle,  both which I shaved down.  Posteriorly she had extensive tearing of the  medial meniscus throughout its  posterior and intercondylar extent.  I  resected this back to stable rim with baskets.  With the remaining meniscus,  I then shaved down the 3.55 shaver taking down the medial gutter and  suprapatellar area.  She had some patellar which I shaved down partially  through this portal.  I then reversed portals and looked laterally.  She had  extensive tearing throughout the internal lateral meniscus which I pictured  fragment of the lateral meniscus was in the intercondylar area and this I  resected.  The lateral meniscus was shaved down until smooth.  She did have  some wear of the lateral tibial plateau which did not require shaving.  I  shaved the remainder of the patella through this approach.  The knee joint  was then irrigated until clear and all fluid possible removed.  The two  anterior portals were closed with 4-0 nylon.  I injected the inflow  apparatus with 20 cc 0.5% Marcaine with Adrenalin and 4 mg of morphine,  removed it and then closed this portal with 4-0 nylon as well.  I then went  ahead and performed an identical  procedure in the right knee where the  difference is being that in the medial joint there is no wear of the medial  femoral condyle which did not require shaving.  The medial meniscus tear was  mainly in the posterior curve and the intercondylar area, and I resected  this back with baskets and shaved it down until smooth with a 3.5 shaver.  On reversing portals, she had more extensive tear in the lateral meniscus in  this knee and also had wear of the lateral femoral condyle, which I shaved  down until smooth. The lateral meniscus, I managed by a combination of  basket resection and shaving down till smooth with a 3.5 shaver.  The  patella wear was almost identical to the left knee.  It was managed using  both portals to shave it down.  When the arthroscopic portion completed,  postoperative treatment will be the same.  The tourniquet on the left leg I  released  after I had completed that portion of the procedure and the right  after we completed it.  Betadine, Adaptic and dry sterile dressing were  applied to both legs followed by Ace wraps.  She tolerated the procedure  well and was taken to the recovery room in satisfactory condition with no  known complications.           ______________________________  Marlowe Kays, M.D.     JA/MEDQ  D:  11/02/2005  T:  11/03/2005  Job:  [id number]

## 2011-04-23 NOTE — Op Note (Signed)
NAME:  [name] [name], [name] [name]                    ACCOUNT NO.:  [id number]   MEDICAL RECORD NO.:  [id number]          PATIENT TYPE:  AMB   LOCATION:  ENDO                         FACILITY:  [facility]   PHYSICIAN:  Georgiana Spinner, M.D.    DATE OF BIRTH:  12-09-1940   DATE OF PROCEDURE:  DATE OF DISCHARGE:                                 OPERATIVE REPORT   PROCEDURE:  Upper endoscopy.   INDICATIONS:  GI bleeding.   ANESTHESIA:  Demerol 70 mg, Versed 8 mg.   DESCRIPTION OF PROCEDURE:  With the patient mildly sedated in the left  lateral decubitus position, the Olympus videoscopic endoscope was inserted  in the mouth and passed under direct vision through the esophagus, which  appeared normal.  There was no evidence of Barrett's.  We entered  into the  stomach.  The  fundus, body, and antrum, were visualized and in the antrum  was some erosion and redness which were photographed and subsequently  biopsied.  The duodenal bulb and second portion of the duodenum were.  There  was no evidence of previously noted ulcer.  From this point, the endoscope  was slowly withdrawn taking circumferential views of the duodenal mucosa  until the endoscope had been  pulled back into the stomach, placed in  retroflexion for view of the stomach from below.  The endoscope was then  straightened, withdrawn, taking circumferential views of the remaining  gastric and esophageal mucosa.  The patient's vital signs and pulse oximetry  remained stable.  The patient tolerated the procedure well without apparent  complication.   FINDINGS:  Erythema and erosions of antrum; otherwise unremarkable  endoscopic examination.  Await biopsy report.  The patient will call me for  results and follow up with me as an outpatient, but I suspect the patient is  taking nonsteroidal anti-inflammatory drugs without concomitant use of  proton pump inhibitors and will discuss further with her as an outpatient.      GMO/MEDQ  D:  02/15/2005  T:   02/15/2005  Job:  [id number]

## 2011-04-23 NOTE — Op Note (Signed)
NAME:  [name] [name], [name] [name]                    ACCOUNT NO.:  [id number]   MEDICAL RECORD NO.:  [id number]          PATIENT TYPE:  AMB   LOCATION:  ENDO                         FACILITY:  [facility]   PHYSICIAN:  Georgiana Spinner, M.D.    DATE OF BIRTH:  02/21/1941   DATE OF PROCEDURE:  02/15/2005  DATE OF DISCHARGE:                                 OPERATIVE REPORT   PROCEDURE:  Colonoscopy.   INDICATIONS:  Hemoccult positivity.   ANESTHESIA:  Demerol 30 mg, Versed 3 mg IV.   DESCRIPTION OF PROCEDURE:  With the patient mildly sedated and in the left  lateral decubitus position, the Olympus videoscopic colonoscope was inserted  in the rectum and passed under direct vision to the cecum, identified by  ileocecal valve and appendiceal orifice, both of which were photographed.  From this point,  the colonoscope was slowly withdrawn, taking  circumferential views of the colonic mucosa, stopping only in the rectum  which appeared normal on direct and showed a small hemorrhoid on retroflexed  view.  The endoscope was straightened and withdrawn.  The patient's vital  signs and pulse oximetry remained stable.  The patient tolerated the  procedure well without apparent complications.   FINDINGS:  Unremarkable exam.   PLAN:  See endoscopy note for further details.      GMO/MEDQ  D:  02/15/2005  T:  02/15/2005  Job:  [id number]

## 2011-04-23 NOTE — Discharge Summary (Signed)
[place]. [facility]: [name] [name]  Patient:    [name] [name], [name] [name]                           MRN: [id number] Adm. Date:  [id number] Disc. 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  HISTORY OF PRESENT ILLNESS: The patient was admitted because of back and leg pain.  The patient had surgery in the past.  X-rays showed degenerative disk disease and spondylosis with recurrent disk.  Surgery was advised by Dr. Lovell Sheehan.  LABORATORY DATA: Laboratories were normal.  HOSPITAL COURSE: The patient was taken to surgery and bilateral L4-5 diskectomy followed by fusion was done.  The patient has been slow to ambulate but now is doing much better and is up walking, and so far the pain is less. She is being discharged today to be followed by Dr. Lovell Sheehan.  DISCHARGE CONDITION: Improving.  DISCHARGE MEDICATIONS:  1. Percocet.  2. Diazepam.  DISCHARGE DIET: Regular.  DISCHARGE ACTIVITY: She is not to drive, no heavy lifting.  FOLLOW-UP: She is to be seen by Dr. Lovell Sheehan in two weeks. DD:  04/05/00 TD:  04/06/00 Job: [id number]

## 2011-04-23 NOTE — Op Note (Signed)
[place]. [facility]  Patient:    [name] [name], [name] [name] Visit Number: [id number] MRN: [id number]          Service Type: DSU Location: [facility] 28[address] 11 Attending Physician:  Cristi Loron Dictated by:   Cristi Loron, M.D. Proc. 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  PROCEDURE:  Right carpal tunnel release (right median neurolysis).  SURGEON:  Cristi Loron, M.D.  ASSISTANT:  None.  ANESTHESIA:  Local.  ESTIMATED BLOOD LOSS:  Minimal.  SPECIMENS:  None.  DRAINS:  None.  COMPLICATIONS:  None.  BRIEF HISTORY:  The patient is a 70 year old white female who suffered from bilateral hand numbness consistent with a median neuropathy.  She was worked up with NCV and EMGs which were consistent with carpal tunnel syndrome at the wrists.  She had previously undergone a left carpal tunnel release and had done well and elected to proceed with a right carpal tunnel release after weighing the risks, benefits, and alternative surgery.  DESCRIPTION OF PROCEDURE:  The patient was brought to the operating room by the nursing team.  Her right hand and upper extremity were prepared and scrubbed with Betadine solution.  Sterile drapes were applied.  I then injected the area to be incised with Marcaine solution and used the 15 blade scalpel to make an incision in the patients right palmar crease.  I used electrocautery to dissect down through the adipose tissue to the superficial fascia.  I divided it with the 15 blade scalpel and then encountered the transverse carpal ligament.  I divided it carefully with the 15 blade scalpel, staying on the ulnar aspect of the median nerve.  I identified the median nerve and  then continued to ligate the transverse carpal ligament both proximally and distally, getting complete decompression of the median nerve. I then probed proximally and distally.  I then achieved strenuous hemostasis using bipolar electrocautery and reapproximated the patients skin and subcutaneous tissue with a running 2-0 nylon suture.  The wound was then cleansed with Bacitracin solution and then coated with Bacitracin ointment.  A sterile dressing was applied.  The drapes were removed and the patient was subsequently discharged.  All sponge, instrument, and needle counts were correct at the end of the case. Dictated by:   Cristi Loron, M.D. Attending Physician:  Tressie Stalker D DD:  02/19/02 TD:  02/21/02 Job: [id number]

## 2011-04-23 NOTE — Op Note (Signed)
NAME:  [name], [name] [name]                    ACCOUNT NO.:  [id number]   MEDICAL RECORD NO.:  [id number]          PATIENT TYPE:  OBV   LOCATION:  0342                         FACILITY:  [facility]   PHYSICIAN:  Georgiana Spinner, M.D.    DATE OF BIRTH:  [birth year]   DATE OF PROCEDURE:  11/25/2004  DATE OF DISCHARGE:                                 OPERATIVE REPORT   PROCEDURE:  Upper endoscopy with biopsy.   INDICATIONS FOR PROCEDURE:  GI bleed.   ANESTHESIA:  Demerol 60, Versed 6 mg.   DESCRIPTION OF PROCEDURE:  With the patient mildly sedated in the left  lateral decubitus position, the Olympus videoscopic endoscope was inserted  in the mouth and passed under direct vision through the esophagus which  appeared normal. There was no evidence of Barrett's esophagus. We entered  into the stomach.  The fundus, body, antrum, duodenal bulb and second  portion of the duodenum were visualized.  From this point, the endoscope was  slowly withdrawn taking circumferential views of the duodenal mucosa until  the endoscope had been pulled back into the stomach, placed in retroflexion  to view the stomach from below. The endoscope was then straightened and  withdrawn after biopsying the antral ulcer that was seen and photographed.  The patient's vital signs and pulse oximeter remained stable.  The patient  tolerated the procedure well without apparent complications.   FINDINGS:  Ulcer of the antrum biopsied.  Await biopsy report. The patient  will call me for results and followup with me as an outpatient. Continue PPI  therapy. Avoidance of aspirin or NSAID's and will begin iron therapy to  reveal her H&H.      GMO/MEDQ  D:  11/25/2004  T:  11/25/2004  Job:  [id number]

## 2011-04-23 NOTE — Op Note (Signed)
NAME:  [name] [name], [name] [name]                    ACCOUNT NO.:  [id number]   MEDICAL RECORD NO.:  [id number]          PATIENT TYPE:  AMB   LOCATION:  ENDO                         FACILITY:  [facility]   PHYSICIAN:  Georgiana Spinner, M.D.    DATE OF BIRTH:  1941/08/17   DATE OF PROCEDURE:  DATE OF DISCHARGE:                                 OPERATIVE REPORT   PROCEDURE:  Upper endoscopy.   INDICATION:  Ulcer with bleeding.   ANESTHESIA:  Demerol 60, Versed 10 mg.   ENDOSCOPIST:  Georgiana Spinner, M.D.   DESCRIPTION OF PROCEDURE:  With the patient mildly sedated in the left  lateral decubitus position, the Olympus videoscopic endoscope was inserted  in the mouth, passed under direct vision through the esophagus which  appeared normal, into the stomach, fundus, body, antrum appeared normal, as  did the duodenal bulb and the second portion of the duodenum.  From this  point, the endoscope was slowly withdrawn taking circumferential views of  the duodenal mucosa until the endoscope had been pulled back into the  stomach, placed in retroflexion to view the stomach from below.  The  endoscope was straightened and withdrawn taking circumferential views of the  remaining gastric and esophageal mucosa.  The patient's vital signs and  pulse oximetry remained stable.  The patient tolerated the procedure well  with no apparent complications.   FINDINGS:  Unremarkable examination.   PLAN:  Have the patient follow up with me as needed.       GMO/MEDQ  D:  05/24/2005  T:  05/24/2005  Job:  [id number]

## 2011-04-23 NOTE — Op Note (Signed)
[place]. [facility]  Patient:    [name] [name], [name] [name]                           MRN: [id number] Proc. Date: 03/30/00 Adm. Date:  [id number] Attending:  Tressie Stalker D                           Operative Report  BRIEF HISTORY:  The patient is a 70 year old white female who I performed a prior left L4 laminotomy for treatment of a stable cyst.  She initially did well and hen had recurrent pain.  I ended up re-exploring the wound to explore the options of the possibility of infection.  It turned out that she did not have an infection. This was all done in June or July, 2000.  She made a good recovery and was doing well until approximately March, 2001, when she began having recurrent pain.  I repeated her MRI scan.  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  I discussed the various treatment options with her.  She weighed the risks, benefits and alternatives to surgery and decided to proceed with a lumbar decompression, stabilization and fusion.  PREOPERATIVE DIAGNOSES: 1. 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 2. Degenerative disk disease. 3. Right synovial cyst. 4. Spondylosis and spinal stenosis.  POSTOPERATIVE DIAGNOSES: 1. 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 2. Degenerative disk disease. 3. Right synovial cyst. 4. Spondylosis and spinal stenosis.  PROCEDURE:  Bilateral L4 laminotomy and foraminotomy with removal of recurrent intraspinal synovial cyst, posterior lumbar interbody fusion with insertion of bilateral titanium interbody cages (Ray 16 x 26 mm) and fusion with morcellized  cancellous allograft bone.  SURGEON:  Cristi Loron, M.D.  ASSISTANT:  Mena Goes. Franky Macho, M.D.  ANESTHESIA:  General endotracheal.  ESTIMATED BLOOD LOSS:  400 cc.  SPECIMENS:  I took cultures and biopsies of epispinal tissue as well as of the ew synovial cyst (primary diagnosis  of the epispinal tissue was myxoid tissue).  DESCRIPTION OF PROCEDURE:  The patient was brought to the operating room by the  anesthesia team.  General endotracheal anesthesia was induced.  She was then turned to the prone position on the Haslet frame.  The lumbosacral region was then prepared with Betadine scrub and Betadine solution and sterile drapes were applied. I then injected ______ with Marcaine with epinephrine solution and used the scalpel to ellipse out the patients prior incision and extend it in both a cephalad and caudal direction.  I used electrocautery to dissect down to the thoracolumbar fascia and I divided the fascia in the midline and performed a bilateral subperiosteal dissection, stripping the paraspinous musculature from he spinous processes and lamina of L4 and L5.  I inserted the McCullough retractor for exposure.  Of note, there was some abnormal appearing, very soft, gelatinous material above the L3-4 ligamentum flavum on the right.  I obtained an intraoperative culture f this and sent it for Gram stain, anaerobic, aerobic and fungal cultures.  I also sent it for pathology.  The preliminary diagnosis of this was myxoid tissue.  I  removed all of this tumor using suction and pituitary forceps and then used the  Midas Rex high speed drill to perform a right L4 laminotomy and foraminotomy.  drilled the caudal edge of the right L4 lamina until I encountered the insertion of  the ligamentum flavum.  I then used the Kerrison punch to widen the laminotomy o remove the right L4-5 facet joint and remove the ligamentum flavum from L4-5. his decompressed the thecal sac and the right L5 and L4 nerve roots.  I then carefully freed up the nerve root and the thecal sac from the epidural tissue and then performed an aggressive diskectomy at L4-5 on the right using the #15 blade scalpel, pituitary, Epstein and Scoville curets.  I then turned my attention to the  left L4-5 interspace.  Of note, this side was  quite scarred down from prior surgery.  I drilled with the Midas Rex drill both  cephalad an caudally until I exposed the left L5 lamina as well as the remainder of the left L4 lamina.  I drilled in a cephalad direction and drilled out the caudal edge of the remaining L4 lamina until I encountered relatively non scarred down  area.  I then used the Kerrison punch to widen this laminotomy and remove the left L4-5 facet joint.  I then used a Penfield 4 and a nerve hook, etc, to dissect the scar tissue from the dura.  I identified the left L4 and L5 nerve root.  There as quite a bit of scarring I identified the prior dural sutures from the prior operation.  I then freed the thecal sac and the L5 nerve root from the underlying disk space.  I then incised into the left L4-5 intervertebral disk with the #15  blade scalpel and performed an aggressive diskectomy using the pituitary forceps, Scoville and Epstein curets.  I vigorously scraped the vertebral at L4-5.  I performed foraminotomies about the bilateral L4 and L5 nerve roots.  At this point, I had good decompression of the thecal sac and the bilateral L4 nd L5 nerve roots.  I now turned my attention to the posterior lumbar interbody fusion.  I inserted the vertebral body spreader to the right L4-5 intervertebral disk space and distracted L4 and L5.  I then carefully retracted the right L4 nerve root in a cephalad direction and the L5 nerve root medially and inserted a 16 mm tang retractor under fluoroscopic guidance.  I then drilled out the interspace,  tapped the interspace and inserted a 16 x 26 mm interbody cage on the right at L4-5.  I removed the tang retractor and inspected the right L4 and L5 nerve root for any damage, and there was none.  I then repeated this procedure on the left  side, i.e., I remove the vertebral body spreader from the left L4-5 interspace. I then  carefully retracted the nerve roots and inserted a 16 mm tang retractor.  drilled out the interspace, tapped it and inserted a 16 x 26 mm interbody cage under fluoroscopic guidance into the L4-5 intervertebral disk space.  I removed the  retractor and inspected the nerve roots for any damage and there was none.  I then copiously irrigated the wound with Bacitracin solution and removed the solution.  I inspected the thecal sac for any holes.  There were none.  I then filled the interbody cages with cancellous morcellized autograft bone and then placed caps  over both of the cages.  I did not mention that the patient had a large synovial cyst at L4-5 on the right. I removed this by freeing up from the underlying thecal sac with a Penfield #4 nd then undercutting it with the Kerrison punches.  I sent this off as a separate  specimen to the pathologist for permanent section.  I then copiously irrigated the wound with Bacitracin solution.  I removed the solution and then removed the McCullough retractor and reapproximated the patients thoracolumbar fascia with interrupted #1 Vicryl, the subcutaneous tissue with interrupted 2-0 Vicryl and the skin with Steri-Strips and benzoin.  The wound was then coated with Bacitracin ointment and a sterile dressing applied.  The drapes were removed.  The patient was returned to the supine position, where she was extubated by the anesthesia team and transported to the post anesthesia care unit in stable condition.  All sponge, needle and instrument counts were correct at he end of this case. DD:  03/30/00 TD:  03/30/00 Job: [id number]

## 2011-04-23 NOTE — H&P (Signed)
. [name] [name]  Patient:    [name], [name]                           MRN: [id number] Adm. Date:  [id number] Attending:  Tressie Stalker D Dictator:   [name] [name], M.D.                         History and Physical  CHIEF COMPLAINT:  Bilateral leg pain.  HISTORY OF PRESENT ILLNESS:  The patient is a 70 year old white female who I performed a left L4 laminotomy foraminotomy for a synovial cyst on 05/25/99. The patient initially did well, but began having some recurrent pain.  I got an MRI scan a few weeks later that demonstrated a question of an infection, so I re-explored her back and did not find any infection.  The patient did well recently until approximately March 2001, when she began having recurrent leg pain.  It was mainly on her left initially, but now it is getting more painful in the right leg.  She failed medical management.  I therefore worked her up with another lumbar MRI which demonstrated a recurrent synovial cyst, but this time it was on the right side (before it had been on the left).  I discussed the various treatment options with her, including doing nothing, a redo laminotomy foraminotomy for removal of the cyst, versus a bilateral laminotomy foraminotomy fasciectomy and a posterior lumbar interbody fusion.  The patient weighed the risks, benefits, and alternatives to surgery and decided to proceed with a decompression and fusion.  PAST MEDICAL HISTORY: 1. Hypertension. 2. Hypercholesterolemia. 3. Bilateral carpal tunnel syndrome. 4. Rosacea. 5. Remote history of appendicitis. 6. Benign breast lump.  PAST SURGICAL HISTORY: 1. Appendectomy in 1948. 2. Dilatation and curettage in 1968 and 1975. 3. Benign breast lumpectomy in September 1998.  MEDICATIONS PRIOR TO ADMISSION: 1. Baycol 0.3 mg p.o. q.d. 2. Zestoretic 10/12.5 p.o. q.d. 3. Prempro 2.5 mg p.o. q.d. 4. Claritin 10 mg p.o. q.d. p.r.n. 5. Flonase 15 mcg  p.r.n. 6. Des-Owen 0.05% lotion p.r.n.  ALLERGIES:  No known drug allergies.  SOCIAL HISTORY:  The patient is married.  She has two children.  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  She lives in [place].  She denies tobacco and drug use.  She drinks alcohol socially.  REVIEW OF SYSTEMS:  Negative except as above.  PHYSICAL EXAMINATION:  GENERAL:  A pleasant moderately obese 70 year old white female who complains of bilateral leg pain.  HEENT:  Normocephalic, atraumatic.  Pupils are equal, round and reactive to light.  Extraocular movements intact.  Sclera white.  Conjunctivae pink. Oropharynx benign.  Uvula is midline.  She is wearing corrective lenses.  NECK:  Supple, no masses, deformities, tracheal deviation, jugular venous distension, carotid bruits.  She has normal cervical range of motion. Spurlings test is negative.  Lhermittes sign was not present.  CHEST:  Thorax was symmetric.  LUNGS:  Clear to auscultation.  HEART:  Regular rate and rhythm.  ABDOMEN:  Soft, nontender, obese.  EXTREMITIES:  No obvious deformities.  BACK:  The patient has a well healed midline lumbar incision without signs of infection.  Straight leg raise testing is positive on the left, negative on the right, faberes testing is equivocal on the right and negative on the left.  NEUROLOGIC:  The patient is alert and oriented x 3.  Cranial nerves II-XII grossly intact bilaterally.  Vision and hearing are grossly normal bilaterally.  Motor strength is 5/5 in biceps, triceps, wrist extensor, and deltoid, psoas, quadriceps, gastrocnemius, extensor hallucis longus.  Deep tendon reflexes are normal and symmetric.  Cerebellar examination is intact to rapid alternating movements of the upper extremities bilaterally.  Sensory examination is grossly normal to light touch, and toes are downgoing bilaterally.  IMAGING STUDIES:  The patient had a lumbar MRI performed with and  without contrast at [name] which demonstrates a mild L4-5 spondylolisthesis with a recurrent right synovial cyst herniation.  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  I discussed the situation with the patient and her husband and reviewed the MRI scan with them and pointed out the abnormalities.  I have discussed the various treatment options, including doing nothing, continue with medical management, and surgery.  I described the different surgical options, i.e. a simple laminotomy foraminotomy for removal of this new synovial cyst versus bilateral laminotomy foraminotomy, fascitectomy, and posterior lumbar interbody fusion with insertion of titanium interbody cages and interbody bone graft.  After showing her surgical models, discussed the risks, benefits, and alternatives to both surgeries, the patient decided to proceed with a lumbar decompression stabilization and fusion on 03/30/00. DD:  03/30/00 TD:  03/30/00 Job: [id number]

## 2011-04-23 NOTE — H&P (Signed)
[facility]  Patient:    [name], [name] Visit Number: [id number] MRN: [id number]          Service Type: PMG Location: [facility] Attending Physician:  Thyra Breed Adm. Date:  06/23/2001   CC:         [name] [name], M.D.  [name] [name], M.D.   History and Physical  FOLLOW-UP EVALUATION  [name] [name] comes in for follow-up evaluation of her chronic low back pain syndrome. Since her last evaluation she has noted that her pain is under better control with three tablets a day of the OxyContin and Neurontin.  She feels very encouraged.  She does have a lot of stress at work and does note that the OxyContin does not quite hold her as well in the morning as in the evening where she takes 20 mg.  She has been continuing to do a lot of swimming, weights, and household work.  She notes that rest, ice, and meds help. Standing or sitting too long increase her discomfort.  PHYSICAL EXAMINATION:  VITAL SIGNS:  Blood pressure 143/67, heart rate is 82, respiratory rate 16, O2 saturations 99%.  Pain level is 4/10.  NEUROLOGIC:  Her neuro exam is unchanged from her last visit.  Gait is unchanged.  CURRENT MEDICATIONS: 1. Lescol 80 mg one in the evening. 2. Zestril 10 mg one p.o. q.d. 3. Hydrochlorothiazide 25 mg one q.d. 4. Ambien 10 mg at night. 5. Synthroid 0.88 mg in the morning. 6. Flonase. 7. OxyContin 10 mg one in the morning, two in the evening. 8. Neurontin 300 mg three times a day. 9. Lamisil.  IMPRESSION: 1. 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 2. Other medical problems per primary care physician, including neck pain, for    which she is getting an MRI in the near future.  DISPOSITION: 1. Increase OxyContin to 20 mg one p.o. b.i.d., #60, with no refill. 2. Continue on Neurontin. 3. Amitriptyline 10 mg one p.o. q.p.m.  She was advised of the side effects    of this.  She  was given #30 with three refills. 4. Follow up with me in four weeks. 5. Continue with increased level of activity. Attending Physician:  Thyra Breed DD:  07/26/01 TD:  07/26/01 Job: [id number]

## 2011-04-23 NOTE — Op Note (Signed)
[place]  Patient:    [name] [name], [name] [name] Visit Number: [id number] MRN: [id number]          Service Type: DSU Location: [facility] 3172 [id number] Attending Physician:  Cristi Loron Dictated by:   Cristi Loron, M.D. Proc. Date: 01/04/02 Admit Date:  01/04/2002 Discharge Date: 01/04/2002                             Operative Report  HISTORY OF PRESENT ILLNESS:  The patient is a 70 year old white female with bilateral hand numbness.  She was worked up with ______ which demonstrated a mild to severe carpal tunnel syndrome.  She therefore weighed the risks, benefits, and alternatives to surgery, and decided to proceed with a left carpal tunnel release.  PREOPERATIVE DIAGNOSIS:  Bilateral carpal tunnel syndrome.  POSTOPERATIVE DIAGNOSIS:  Bilateral carpal tunnel syndrome.  PROCEDURE:  Left carpal tunnel release (median nerve, but neurolysis after the wrist).  SURGEON:  Cristi Loron, M.D.  ASSISTANT:  None.  ANESTHESIA:  Local.  ESTIMATED BLOOD LOSS:  Minimal.  SPECIMENS:  None.  DRAINS:  None.  COMPLICATIONS:  None.  DESCRIPTION OF PROCEDURE:  The patient was brought to the operating room by the nursing team.  Her left hand and arm were prepared with Betadine, scrubbed with Betadine solution.  Sterile drapes were applied.  I then injected the area to be incised with Marcaine solution, and then used the scalpel to make an incision along the patients palmar crease.  I used the ______ retractor for exposure, and then used the 15 blade scalpel to ligate the patients superficial ______, and then ligated the transverse carpal ligament.  I identified the median nerve and completed the ligation of the transverse carpal ligament along the ulnar aspect of the median nerve.  I used the Sims retractor and Metzenbaum scissors to ligate the transverse carpal ligament completely proximally then distally, completing the decompression of the median  nerve.  I then achieved stringent hemostasis using bipolar electrocautery, and then reapproximated the patients skin using a 3-0 nylon suture.  The wound was then coated with bacitracin ointment.  A sterile dressing was applied.  The drapes were removed, and the patient was subsequently discharged in stable condition.  All sponge, needle, and instrument counts were correct at the end of this case. Dictated by:   Cristi Loron, M.D. Attending Physician:  Tressie Stalker D DD:  01/04/02 TD:  01/05/02 Job: [id number]

## 2011-06-26 ENCOUNTER — Other Ambulatory Visit: Payer: Self-pay | Admitting: Gastroenterology

## 2011-07-14 ENCOUNTER — Other Ambulatory Visit: Payer: Self-pay | Admitting: Dermatology

## 2011-10-22 ENCOUNTER — Ambulatory Visit
Admission: RE | Admit: 2011-10-22 | Discharge: 2011-10-22 | Disposition: A | Discharge status: Home / Self Care | Payer: Medicare Other | Source: Ambulatory Visit | Attending: Anesthesiology | Admitting: Anesthesiology

## 2011-10-22 ENCOUNTER — Other Ambulatory Visit: Payer: Self-pay | Admitting: Anesthesiology

## 2011-10-22 DIAGNOSIS — T1490XA Injury, unspecified, initial encounter: Secondary | ICD-10-CM

## 2011-10-26 ENCOUNTER — Encounter (HOSPITAL_BASED_OUTPATIENT_CLINIC_OR_DEPARTMENT_OTHER): Payer: Self-pay | Admitting: *Deleted

## 2011-10-27 ENCOUNTER — Encounter (HOSPITAL_BASED_OUTPATIENT_CLINIC_OR_DEPARTMENT_OTHER): Payer: Self-pay | Admitting: *Deleted

## 2011-10-27 NOTE — Progress Notes (Signed)
To bring all meds Overnight bag Ins cards To come in prior to [facility] for bmet

## 2011-11-03 ENCOUNTER — Encounter (HOSPITAL_BASED_OUTPATIENT_CLINIC_OR_DEPARTMENT_OTHER)
Admission: RE | Admit: 2011-11-03 | Discharge: 2011-11-03 | Disposition: A | Discharge status: Home / Self Care | Payer: Medicare Other | Source: Ambulatory Visit | Attending: Orthopedic Surgery | Admitting: Orthopedic Surgery

## 2011-11-03 ENCOUNTER — Encounter (HOSPITAL_BASED_OUTPATIENT_CLINIC_OR_DEPARTMENT_OTHER): Payer: Self-pay | Admitting: *Deleted

## 2011-11-03 LAB — BASIC METABOLIC PANEL
CO2: 25 mEq/L (ref 19–32)
Chloride: 101 mEq/L (ref 96–112)
Glucose, Bld: 92 mg/dL (ref 70–99)
Potassium: 4 mEq/L (ref 3.5–5.1)
Sodium: 138 mEq/L (ref 135–145)

## 2011-11-04 ENCOUNTER — Encounter (HOSPITAL_BASED_OUTPATIENT_CLINIC_OR_DEPARTMENT_OTHER): Payer: Self-pay | Admitting: Anesthesiology

## 2011-11-04 ENCOUNTER — Encounter (HOSPITAL_BASED_OUTPATIENT_CLINIC_OR_DEPARTMENT_OTHER): Payer: Self-pay

## 2011-11-04 ENCOUNTER — Ambulatory Visit (HOSPITAL_BASED_OUTPATIENT_CLINIC_OR_DEPARTMENT_OTHER): Payer: Medicare Other | Admitting: Anesthesiology

## 2011-11-04 ENCOUNTER — Encounter (HOSPITAL_BASED_OUTPATIENT_CLINIC_OR_DEPARTMENT_OTHER)
Admission: RE | Disposition: A | Discharge status: Home / Self Care | Payer: Self-pay | Source: Ambulatory Visit | Attending: Orthopedic Surgery

## 2011-11-04 ENCOUNTER — Ambulatory Visit (HOSPITAL_BASED_OUTPATIENT_CLINIC_OR_DEPARTMENT_OTHER)
Admission: RE | Admit: 2011-11-04 | Discharge: 2011-11-04 | Disposition: A | Discharge status: Home / Self Care | Payer: Medicare Other | Source: Ambulatory Visit | Attending: Orthopedic Surgery | Admitting: Orthopedic Surgery

## 2011-11-04 DIAGNOSIS — K219 Gastro-esophageal reflux disease without esophagitis: Secondary | ICD-10-CM | POA: Insufficient documentation

## 2011-11-04 DIAGNOSIS — Y834 Other reconstructive surgery as the cause of abnormal reaction of the patient, or of later complication, without mention of misadventure at the time of the procedure: Secondary | ICD-10-CM | POA: Insufficient documentation

## 2011-11-04 DIAGNOSIS — E039 Hypothyroidism, unspecified: Secondary | ICD-10-CM | POA: Insufficient documentation

## 2011-11-04 DIAGNOSIS — K449 Diaphragmatic hernia without obstruction or gangrene: Secondary | ICD-10-CM | POA: Insufficient documentation

## 2011-11-04 DIAGNOSIS — M7750 Other enthesopathy of unspecified foot: Secondary | ICD-10-CM | POA: Insufficient documentation

## 2011-11-04 DIAGNOSIS — I10 Essential (primary) hypertension: Secondary | ICD-10-CM | POA: Insufficient documentation

## 2011-11-04 DIAGNOSIS — Z01812 Encounter for preprocedural laboratory examination: Secondary | ICD-10-CM | POA: Insufficient documentation

## 2011-11-04 DIAGNOSIS — IMO0002 Reserved for concepts with insufficient information to code with codable children: Secondary | ICD-10-CM | POA: Insufficient documentation

## 2011-11-04 DIAGNOSIS — M7742 Metatarsalgia, left foot: Secondary | ICD-10-CM

## 2011-11-04 HISTORY — DX: Hypothyroidism, unspecified: E03.9

## 2011-11-04 HISTORY — DX: Other specified postprocedural states: Z98.890

## 2011-11-04 HISTORY — DX: Other chronic pain: G89.29

## 2011-11-04 HISTORY — PX: ORIF TOE FRACTURE: SHX5032

## 2011-11-04 HISTORY — DX: Essential (primary) hypertension: I10

## 2011-11-04 HISTORY — DX: Nausea with vomiting, unspecified: R11.2

## 2011-11-04 HISTORY — DX: Unspecified osteoarthritis, unspecified site: M19.90

## 2011-11-04 SURGERY — OPEN REDUCTION INTERNAL FIXATION (ORIF) METATARSAL (TOE) FRACTURE
Anesthesia: General | Site: Foot | Laterality: Left | Wound class: Clean

## 2011-11-04 MED ORDER — ACETAMINOPHEN 10 MG/ML IV SOLN
INTRAVENOUS | Status: DC | PRN
  Administered 2011-11-04: 1000 mg via INTRAVENOUS

## 2011-11-04 MED ORDER — PROPOFOL 10 MG/ML IV EMUL
INTRAVENOUS | Status: DC | PRN
  Administered 2011-11-04: 200 mg via INTRAVENOUS

## 2011-11-04 MED ORDER — FENTANYL CITRATE 0.05 MG/ML IJ SOLN
50.0000 ug | INTRAMUSCULAR | Status: DC | PRN
  Administered 2011-11-04: 100 ug via INTRAVENOUS

## 2011-11-04 MED ORDER — LACTATED RINGERS IV SOLN
INTRAVENOUS | Status: DC | PRN
  Administered 2011-11-04 (×2): via INTRAVENOUS

## 2011-11-04 MED ORDER — LABETALOL HCL 5 MG/ML IV SOLN
INTRAVENOUS | Status: DC | PRN
  Administered 2011-11-04: 5 mg via INTRAVENOUS

## 2011-11-04 MED ORDER — BUPIVACAINE-EPINEPHRINE PF 0.5-1:200000 % IJ SOLN
INTRAMUSCULAR | Status: DC | PRN
  Administered 2011-11-04: 50 mL

## 2011-11-04 MED ORDER — SCOPOLAMINE 1 MG/3DAYS TD PT72
1.0000 | MEDICATED_PATCH | Freq: Once | TRANSDERMAL | Status: AC
  Administered 2011-11-04 (×2): 1.5 mg via TRANSDERMAL

## 2011-11-04 MED ORDER — MIDAZOLAM HCL 2 MG/2ML IJ SOLN
1.0000 mg | INTRAMUSCULAR | Status: DC | PRN
  Administered 2011-11-04: 2 mg via INTRAVENOUS

## 2011-11-04 MED ORDER — OXYCODONE HCL 5 MG PO TABS: 5.0000 mg | ORAL_TABLET | ORAL | Status: AC | PRN

## 2011-11-04 MED ORDER — LIDOCAINE HCL (CARDIAC) 20 MG/ML IV SOLN
INTRAVENOUS | Status: DC | PRN
  Administered 2011-11-04: 80 mg via INTRAVENOUS

## 2011-11-04 MED ORDER — SODIUM CHLORIDE 0.9 % IV SOLN: INTRAVENOUS | Status: DC

## 2011-11-04 MED ORDER — PROMETHAZINE HCL 25 MG/ML IJ SOLN: 6.2500 mg | INTRAMUSCULAR | Status: DC | PRN

## 2011-11-04 MED ORDER — FENTANYL CITRATE 0.05 MG/ML IJ SOLN
INTRAMUSCULAR | Status: DC | PRN
  Administered 2011-11-04: 25 ug via INTRAVENOUS
  Administered 2011-11-04: 50 ug via INTRAVENOUS

## 2011-11-04 MED ORDER — EPHEDRINE SULFATE 50 MG/ML IJ SOLN
INTRAMUSCULAR | Status: DC | PRN
  Administered 2011-11-04 (×2): 10 mg via INTRAVENOUS

## 2011-11-04 MED ORDER — ONDANSETRON HCL 4 MG/2ML IJ SOLN
INTRAMUSCULAR | Status: DC | PRN
  Administered 2011-11-04: 4 mg via INTRAVENOUS

## 2011-11-04 MED ORDER — CHLORHEXIDINE GLUCONATE 4 % EX LIQD: 60.0000 mL | Freq: Once | CUTANEOUS | Status: DC

## 2011-11-04 MED ORDER — CEFAZOLIN SODIUM-DEXTROSE 2-3 GM-% IV SOLR: 2.0000 g | INTRAVENOUS | Status: DC

## 2011-11-04 MED ORDER — DEXAMETHASONE SODIUM PHOSPHATE 4 MG/ML IJ SOLN
INTRAMUSCULAR | Status: DC | PRN
  Administered 2011-11-04: 10 mg via INTRAVENOUS

## 2011-11-04 MED ORDER — CEFAZOLIN SODIUM 1-5 GM-% IV SOLN
INTRAVENOUS | Status: DC | PRN
  Administered 2011-11-04: 2 g via INTRAVENOUS

## 2011-11-04 MED ORDER — LACTATED RINGERS IV SOLN
INTRAVENOUS | Status: DC
  Administered 2011-11-04: 08:00:00 via INTRAVENOUS

## 2011-11-04 SURGICAL SUPPLY — 85 items
BAG DECANTER FOR FLEXI CONT (MISCELLANEOUS) ×1 IMPLANT
BANDAGE COBAN STERILE 4 (GAUZE/BANDAGES/DRESSINGS) ×2 IMPLANT
BANDAGE COBAN STERILE 6 (GAUZE/BANDAGES/DRESSINGS) ×1 IMPLANT
BANDAGE GAUZE 4  KLING STR (GAUZE/BANDAGES/DRESSINGS) IMPLANT
BB-TAK ×1 IMPLANT
BLADE SURG 15 STRL LF DISP TIS (BLADE) ×2 IMPLANT
BLADE SURG 15 STRL SS (BLADE) ×4
BNDG CMPR 9X4 STRL LF SNTH (GAUZE/BANDAGES/DRESSINGS) ×1
BNDG COHESIVE 4X5 TAN STRL (GAUZE/BANDAGES/DRESSINGS) ×2 IMPLANT
BNDG COHESIVE 6X5 TAN STRL LF (GAUZE/BANDAGES/DRESSINGS) IMPLANT
BNDG ESMARK 4X9 LF (GAUZE/BANDAGES/DRESSINGS) ×2 IMPLANT
CAP PIN ORTHO PINK (CAP) IMPLANT
CHLORAPREP W/TINT 26ML (MISCELLANEOUS) ×2 IMPLANT
CLOTH BEACON ORANGE TIMEOUT ST (SAFETY) ×2 IMPLANT
COVER TABLE BACK 60X90 (DRAPES) ×2 IMPLANT
CUFF TOURNIQUET SINGLE 18IN (TOURNIQUET CUFF) IMPLANT
CUFF TOURNIQUET SINGLE 34IN LL (TOURNIQUET CUFF) ×1 IMPLANT
DECANTER SPIKE VIAL GLASS SM (MISCELLANEOUS) IMPLANT
DRAPE EXTREMITY T 121X128X90 (DRAPE) ×2 IMPLANT
DRAPE OEC MINIVIEW 54X84 (DRAPES) ×2 IMPLANT
DRAPE SURG 17X23 STRL (DRAPES) IMPLANT
DRAPE U-SHAPE 47X51 STRL (DRAPES) ×2 IMPLANT
DRAPE U-SHAPE 76X120 STRL (DRAPES) IMPLANT
DRILL BIT 2.2MM ×2 IMPLANT
DRILL BIT CANNULATED  AO, 3 MM ×2 IMPLANT
DRSG EMULSION OIL 3X3 NADH (GAUZE/BANDAGES/DRESSINGS) ×2 IMPLANT
DRSG PAD ABDOMINAL 8X10 ST (GAUZE/BANDAGES/DRESSINGS) ×2 IMPLANT
ELECT REM PT RETURN 9FT ADLT (ELECTROSURGICAL) ×2
ELECTRODE REM PT RTRN 9FT ADLT (ELECTROSURGICAL) ×1 IMPLANT
GAUZE SPONGE 4X4 12PLY STRL LF (GAUZE/BANDAGES/DRESSINGS) ×1 IMPLANT
GLOVE BIO SURGEON STRL SZ8 (GLOVE) ×1 IMPLANT
GLOVE BIOGEL PI IND STRL 8 (GLOVE) ×1 IMPLANT
GLOVE BIOGEL PI INDICATOR 8 (GLOVE) ×1
GLOVE INDICATOR 6.5 STRL GRN (GLOVE) ×1 IMPLANT
GLOVE SKINSENSE NS SZ8.0 LF (GLOVE) ×1
GLOVE SKINSENSE STRL SZ6.0 (GLOVE) ×1 IMPLANT
GLOVE SKINSENSE STRL SZ8.0 LF (GLOVE) IMPLANT
GOWN PREVENTION PLUS XLARGE (GOWN DISPOSABLE) ×2 IMPLANT
GOWN PREVENTION PLUS XXLARGE (GOWN DISPOSABLE) ×2 IMPLANT
GUIDE WIRE .045 ×1 IMPLANT
GUIDE WIRE .062 ×1 IMPLANT
K-WIRE 102X1.4 (WIRE) IMPLANT
NDL SAFETY ECLIPSE 18X1.5 (NEEDLE) IMPLANT
NEEDLE HYPO 18GX1.5 SHARP (NEEDLE)
NEEDLE HYPO 22GX1.5 SAFETY (NEEDLE) IMPLANT
PACK BASIN DAY SURGERY FS (CUSTOM PROCEDURE TRAY) ×2 IMPLANT
PAD CAST 4YDX4 CTTN HI CHSV (CAST SUPPLIES) ×2 IMPLANT
PADDING CAST ABS 4INX4YD NS (CAST SUPPLIES)
PADDING CAST ABS COTTON 4X4 ST (CAST SUPPLIES) IMPLANT
PADDING CAST COTTON 4X4 STRL (CAST SUPPLIES) ×2
PADDING CAST COTTON 6X4 STRL (CAST SUPPLIES) IMPLANT
PADDING WEBRIL 4 STERILE (GAUZE/BANDAGES/DRESSINGS) ×2 IMPLANT
PADDING WEBRIL 6 STERILE (GAUZE/BANDAGES/DRESSINGS) ×1 IMPLANT
PENCIL BUTTON HOLSTER BLD 10FT (ELECTRODE) ×2 IMPLANT
PLATE LP MTP CONT SHORT (Plate) ×2 IMPLANT
SCREW CORTEX ST 2.0X12 (Screw) ×1 IMPLANT
SCREW CORTEX ST 2.0X14 (Screw) ×1 IMPLANT
SCREW CORTICAL 3MMX18MM (Screw) ×2 IMPLANT
SCREW LOCKING 3MMX16MM (Screw) ×1 IMPLANT
SCREW LP CORT 3.0X20 (Screw) ×4 IMPLANT
SCREW LP LOCK 3.2X22 (Screw) ×2 IMPLANT
SCREW LP LOCK 3.2X24 (Screw) ×1 IMPLANT
SHEET MEDIUM DRAPE 40X70 STRL (DRAPES) ×2 IMPLANT
SPLINT FAST PLASTER 5X30 (CAST SUPPLIES)
SPLINT PLASTER CAST FAST 5X30 (CAST SUPPLIES) IMPLANT
SPLINT PLASTER CAST XFAST 5X30 (CAST SUPPLIES) IMPLANT
SPLINT PLASTER XFAST SET 5X30 (CAST SUPPLIES) ×15
SPONGE GAUZE 4X4 12PLY (GAUZE/BANDAGES/DRESSINGS) ×1 IMPLANT
SPONGE LAP 18X18 X RAY DECT (DISPOSABLE) ×2 IMPLANT
SPONGE SURGIFOAM ABS GEL 12-7 (HEMOSTASIS) ×1 IMPLANT
STAPLER VISISTAT 35W (STAPLE) IMPLANT
STOCKINETTE 6  STRL (DRAPES) ×1
STOCKINETTE 6 STRL (DRAPES) ×1 IMPLANT
STRIP CLOSURE SKIN 1/2X4 (GAUZE/BANDAGES/DRESSINGS) ×1 IMPLANT
SUCTION FRAZIER TIP 10 FR DISP (SUCTIONS) ×1 IMPLANT
SUT MNCRL AB 4-0 PS2 18 (SUTURE) IMPLANT
SUT PROLENE 3 0 PS 2 (SUTURE) IMPLANT
SUT VIC AB 2-0 SH 18 (SUTURE) IMPLANT
SUT VIC AB 2-0 SH 27 (SUTURE) ×2
SUT VIC AB 2-0 SH 27XBRD (SUTURE) IMPLANT
SUT VICRYL 4-0 PS2 18IN ABS (SUTURE) IMPLANT
SYR BULB 3OZ (MISCELLANEOUS) ×2 IMPLANT
TUBE CONNECTING 20X1/4 (TUBING) ×1 IMPLANT
UNDERPAD 30X30 INCONTINENT (UNDERPADS AND DIAPERS) ×2 IMPLANT
WATER STERILE IRR 1000ML POUR (IV SOLUTION) ×1 IMPLANT

## 2011-11-04 NOTE — Transfer of Care (Signed)
Immediate Anesthesia Transfer of Care Note  Patient: [name] [name]  Procedure(s) Performed:  OPEN REDUCTION INTERNAL FIXATION (ORIF) METATARSAL (TOE) FRACTURE - left revision orif 1st metatarsal with autograft from left calcaneous and left 2nd-3rd metatarsal weil osteotomy  Patient Location: PACU  Anesthesia Type: General  Level of Consciousness: awake and oriented  Airway & Oxygen Therapy: Patient Spontanous Breathing and Patient connected to face mask oxygen  Post-op Assessment: Report given to PACU RN and Post -op Vital signs reviewed and stable  Post vital signs: Reviewed and stable  Complications: No apparent anesthesia complications

## 2011-11-04 NOTE — Anesthesia Preprocedure Evaluation (Signed)
Anesthesia Evaluation    History of Anesthesia Complications (+) PONV  Airway Mallampati: II      Dental   Pulmonary    Pulmonary exam normal       Cardiovascular hypertension,     Neuro/Psych  Neuromuscular disease    GI/Hepatic hiatal hernia, GERD-  ,  Endo/Other  Hypothyroidism   Renal/GU      Musculoskeletal   Abdominal   Peds  Hematology   Anesthesia Other Findings   Reproductive/Obstetrics                           Anesthesia Physical Anesthesia Plan  ASA: II  Anesthesia Plan:    Post-op Pain Management:    Induction: Intravenous  Airway Management Planned: LMA  Additional Equipment:   Intra-op Plan:   Post-operative Plan: Extubation in OR  Informed Consent: I have reviewed the patients History and Physical, chart, labs and discussed the procedure including the risks, benefits and alternatives for the proposed anesthesia with the patient or authorized representative who has indicated his/her understanding and acceptance.     Plan Discussed with:   Anesthesia Plan Comments:         Anesthesia Quick Evaluation

## 2011-11-04 NOTE — Anesthesia Postprocedure Evaluation (Signed)
  Anesthesia Post-op Note  Patient: [name] [name]  Procedure(s) Performed:  OPEN REDUCTION INTERNAL FIXATION (ORIF) METATARSAL (TOE) FRACTURE - left revision orif 1st metatarsal with autograft from left calcaneous and left 2nd-3rd metatarsal weil osteotomy  Patient Location: PACU  Anesthesia Type: GA combined with regional for post-op pain  Level of Consciousness: awake, alert  and oriented  Airway and Oxygen Therapy: Patient Spontanous Breathing  Post-op Pain: none  Post-op Assessment: Post-op Vital signs reviewed, Patient's Cardiovascular Status Stable, Respiratory Function Stable, Patent Airway, No signs of Nausea or vomiting and Pain level controlled  Post-op Vital Signs: stable  Complications: No apparent anesthesia complications

## 2011-11-04 NOTE — Op Note (Signed)
NAME:  [name] [name], [name] [name]                         ACCOUNT NO.:  MEDICAL RECORD NO.:  [id number]  LOCATION:                                 FACILITY:  PHYSICIAN:  Toni Arthurs, MD             DATE OF BIRTH:  DATE OF PROCEDURE:  11/04/2011 DATE OF DISCHARGE:                              OPERATIVE REPORT   PREOPERATIVE DIAGNOSES: 1. 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 2. Left second and third metatarsal head overload (metatarsalgia).  POSTOPERATIVE DIAGNOSES: 1. 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 2. Left second and third metatarsal head overload (metatarsalgia).  PROCEDURE: 1. Removal of deep implants, left foot. 2. Revision of left foot bunion correction (closing wedge osteotomy     nonunion). 3. Autograft from left calcaneus. 4. Left second metatarsal Weil osteotomy. 5. Left third metatarsal Weil osteotomy. 6. Left second MTP joint dorsal capsulotomy. 7. Left third MTP joint dorsal capsulotomy. 8. Left second EDB to EDL transfer. 9. Left third EDB to EDL transfer. 10.Intraoperative interpretation of fluoroscopic imaging.  SURGEON:  Toni Arthurs, MD  ANESTHESIA:  General, regional.  IV FLUIDS:  See Anesthesia record.  ESTIMATED BLOOD LOSS:  Minimal.  TOURNIQUET TIME:  117 minutes at 250 mmHg.  COMPLICATIONS:  None apparent.  DISPOSITION:  Extubated, awake, and stable to recovery.  INDICATIONS FOR PROCEDURE:  The patient is a 70 year old female who underwent attempted bunion correction on the left foot approximately 9 months ago.  She went on to a nonunion of the proximal metatarsal osteotomy.  She also had elevation of the first metatarsal head causing overload and metatarsalgia at the second and third metatarsal heads. She presents now for operative treatment of these conditions having failed conservative care for the nonunion.  She understands the risks and benefits of the  procedure, as well as the alternative treatment options.  She specifically understands the risks of bleeding, infection, nerve damage, blood clots, need for additional surgery, failure to heal, amputation and death.  She would like to proceed.  PROCEDURE IN DETAIL:  After preoperative consent was obtained and the correct operative site was identified, the patient was brought to the operating room and placed supine on the operating table.  General anesthesia was induced.  Preoperative antibiotics were administered. Surgical time-out was taken.  The left upper extremity was prepped and draped in standard sterile fashion with tourniquet around the thigh. The extremity was exsanguinated and tourniquet was inflated to 250 mmHg. A dorsal incision was made over the second web space.  Blunt dissection was carried down to the second MTP joint.  The extensor digitorum longus tendon was lengthened in Z fashion.  The extensor digitorum brevis tendon was released from its insertion.  The dorsal joint capsule was opened medially and laterally allowing appropriate plantar flexion at the MTP joint.  A Weil osteotomy was then performed allowing the metatarsal head to translate several millimeters proximally.  This was fixed with a 2-mm unicortical screw.  The extensor digitorum longus tendon was then repaired and the  extensor digitorum brevis tendon was transferred to the extensor digitorum longus.  This repair was made with 2-0 Vicryl.  Attention was then directed to the third metatarsal phalangeal joint where the same procedures were carried out.  The wounds were irrigated copiously.  The subcutaneous tissue was approximated with inverted simple sutures of 4-0 Monocryl.  The skin was closed with a running 3-0 Prolene.  Attention was then turned to the previous dorsal incision.  This was made and sharp dissection was carried down through the skin and subcutaneous tissue to the level of the plate.  The  plate was cleared of all soft tissue.  All of the screws were removed as well as the plate.  The nonunion site was identified.  All of the fibrous tissue was cleared out with a curette and rongeur.  The osteotomy site was mobilized.  It was irrigated copiously.  A 2.7 mm drill bit was then used to perforate both sides of the osteotomy site, leaving the autograft bone in that location.  The 2 small wedges of bone from the Weil osteotomies were then inserted into the osteotomy site.  A lateral incision was made at the lateral aspect of the heel just above the glabrous border.  Sharp dissection was carried down through the skin and subcutaneous tissue to the periosteum.  A 6-mm trephine was used to harvest 3 cores of autologous bone graft.  These were morselized and packed into the osteotomy site at the first metatarsal.  The osteotomy was reduced taking care to translate the distal fragment proximally as much as was feasible given the nonunion site.  A 0.0625 K-wire was then inserted longitudinally across the osteotomy site, provisionally holding it in place.  A 3-mm fully-threaded cortical screw was inserted perpendicular to the osteotomy site in lag fashion.  This compressed the osteotomy site appropriately and was noted to have good purchase.  The remaining bone graft was packed around the osteotomy site.  The MTP joint arthrodesis plate was then selected from the Arthrex set.  This was positioned on the dorsal medial aspect of the first metatarsal and pinned into place.  AP and lateral views under fluoroscopy showed appropriate position of the plate.  Proximally, the plate was fixed to the base of the first metatarsal with 2 bicortical locking screws. Distally, the compression hole was drilled and a compression screw was inserted in bicortical fashion.  This was noted to compress the osteotomy site slightly.  The remaining 2 locking holes were drilled and filled with bicortical locking  screws.  Final AP, lateral, and oblique views showed appropriate position and length of all hardware and appropriate position of both of the Weil osteotomies and a revision of the first metatarsal closing wedge osteotomy.  The periosteum and subcutaneous tissue were then approximated over the plate with inverted simple sutures of 2-0 Vicryl.  The skin was closed with a running 3-0 Prolene suture.  The lateral heel incision was closed with inverted simple sutures of 3-0 Monocryl and horizontal mattress 3-0 Prolene suture.  Prior to closure, a piece of Gelfoam was packed into the hole in the calcaneus.  Sterile dressings were applied followed by a well- padded short-leg splint.  The tourniquet was released at 1 hour and 57 minutes.  The patient was awakened from anesthesia and transported to the recovery room in stable condition.  FOLLOWUP PLAN:  The patient will be nonweightbearing on her left lower extremity.  She will follow up with me in 2 weeks for suture  removal and conversion to a cast.     Toni Arthurs, MD     JH/MEDQ  D:  11/04/2011  T:  11/04/2011  Job:  [id number]  cc:   Lubertha Basque. Jerl Santos, M.D.

## 2011-11-04 NOTE — Brief Op Note (Signed)
11/04/2011  10:28 AM  PATIENT:  [name] [name]  70 y.o. 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  Removal of deep implants left foot 2.  Revision of left foot bunion correction (closing wedge osteotomy nonunion) 3.  Autograft from left calcaneus 4.  Left 2nd metatarsal osteotomy (weil) 5.  Left 3rd metatarsal osteotomy (weil) 6.  Left 2nd MTPJ dorsal capsulotomy 7.  Left 3rd MTPJ dorsal capsulotomy 8.  Left 2nd EDB to EDL transfer 9.  Left 3rd EDB to EDL transfer 10.  Fluoro  SURGEON:  Toni Arthurs, MD  ASSISTANT: n/a  ANESTHESIA:   General, regional  EBL: minimal   TOURNIQUET:   Total Tourniquet Time Documented: Thigh (Left) - 117 minutes  COMPLICATIONS:  None apparent  DISPOSITION:  Extubated, awake and stable to recovery.  DICTATION ID:  [id number]

## 2011-11-04 NOTE — Anesthesia Procedure Notes (Addendum)
Anesthesia Regional Block:  Popliteal block  Pre-Anesthetic Checklist: ,, timeout performed, Correct Patient, Correct Site, Correct Laterality, Correct Procedure, Correct Position, site marked, Risks and benefits discussed,  Surgical consent,  Pre-op evaluation,  At surgeon's request and post-op pain management  Laterality: Left  Prep: chloraprep       Needles:  Injection technique: Single-shot  Needle Type: Stimulator Needle - 80     Needle Length: 8cm  Needle Gauge: 22 and 22 G    Additional Needles:  Procedures: nerve stimulator Popliteal block  Nerve Stimulator or Paresthesia:  Response: 0.5 mA,   Additional Responses:   Narrative:  Start time: 11/04/2011 7:18 AM End time: 11/04/2011 7:29 AM  Performed by: Personally   Additional Notes: Chg prep, sterile tech, 22 stim needle stim down to .5ma Neg asp No compl Dr Gypsy Balsam   Procedure Name: LMA Insertion Date/Time: 11/04/2011 7:46 AM Performed by: Zenia Resides D Pre-anesthesia Checklist: Patient identified, Emergency Drugs available, Suction available and Patient being monitored Patient Re-evaluated:Patient Re-evaluated prior to inductionOxygen Delivery Method: Circle System Utilized Preoxygenation: Pre-oxygenation with 100% oxygen Intubation Type: IV induction Ventilation: Mask ventilation without difficulty LMA: LMA with gastric port inserted LMA Size: 4.0 Number of attempts: 1 Placement Confirmation: positive ETCO2 and breath sounds checked- equal and bilateral Tube secured with: Tape Dental Injury: Teeth and Oropharynx as per pre-operative assessment     Anesthesia Regional Block:   Narrative:

## 2011-11-04 NOTE — H&P (Signed)
[name] [name] is an 70 y.o. female.   Chief Complaint: L foot pain HPI: 70 y/o female with L foot pain since 1st MT osteotomy 8 mos ago.  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  She also has pain at the 2nd and 3rd MT heads.  Past Medical History  Diagnosis Date  . Complication of anesthesia     slow to wake up-sats drop  . Hypertension   . Hypothyroidism   . Arthritis   . GERD (gastroesophageal reflux disease)   . Blood transfusion   . Hiatal hernia   . Chronic pain   . PONV (postoperative nausea and vomiting)     states she had scop patch in past    Past Surgical History  Procedure Date  . Hammer toe surgery 4/12    lt foot-2-3  . Appendectomy   . Metatarsal osteotomy 3/12    lt  . Thumb fusion 2008    rt  . Carpal tunnel release     both  . Knee arthroscopy 2007    both  . Back surgery 09,00,10,99    4 back surg    History reviewed. No pertinent family history. Social History: No tobacco.  Social etoh.  Lives with her husband.  Allergies:  Allergies  Allergen Reactions  . Ciprocin-Fluocin-Procin (Fluocinolone Acetonide)   . Eggs Or Egg-Derived Products Nausea And Vomiting  . Latex   . Lisinopril (Fd&C Blue #2 [place]-Lisinopril)   . Morphine And Related   . Sulfa Antibiotics     Medications Prior to Admission  Medication Dose Route Frequency Provider Last Rate Last Dose  . fentaNYL (SUBLIMAZE) injection 50-100 mcg  50-100 mcg Intravenous PRN Bedelia Person, MD      . lactated ringers infusion   Intravenous Continuous Constance Goltz, MD      . midazolam (VERSED) injection 1-2 mg  1-2 mg Intravenous PRN Bedelia Person, MD      . scopolamine (TRANSDERM-SCOP) 1.5 MG 1.5 mg  1 patch Transdermal Once Bedelia Person, MD       Medications Prior to Admission  Medication Sig Dispense Refill  . Ascorbic Acid (VITAMIN C WITH ROSE HIPS) 1000 MG tablet Take 1,000 mg by mouth daily.        Marland Kitchen aspirin 81 MG chewable tablet Chew 81 mg by mouth daily.        Marland Kitchen  atorvastatin (LIPITOR) 10 MG tablet Take 10 mg by mouth every evening.        . calcium carbonate (OS-CAL - DOSED IN MG OF ELEMENTAL CALCIUM) 1250 MG tablet Take 1 tablet by mouth daily.        . fexofenadine (ALLEGRA) 180 MG tablet Take 180 mg by mouth daily.        . furosemide (LASIX) 40 MG tablet Take 40 mg by mouth daily.        Marland Kitchen gabapentin (NEURONTIN) 600 MG tablet Take 600 mg by mouth 3 (three) times daily.        Marland Kitchen levothyroxine (SYNTHROID, LEVOTHROID) 75 MCG tablet Take 75 mcg by mouth daily.        . meloxicam (MOBIC) 15 MG tablet Take 15 mg by mouth daily.        . methadone (DOLOPHINE) 5 MG tablet Take 5 mg by mouth QID.        Marland Kitchen metoprolol succinate (TOPROL-XL) 25 MG 24 hr tablet Take 25 mg by mouth daily.        Marland Kitchen omeprazole (  PRILOSEC) 20 MG capsule Take 20 mg by mouth daily.        Marland Kitchen oxyCODONE (OXY IR/ROXICODONE) 5 MG immediate release tablet Take 5 mg by mouth every 4 (four) hours as needed.        . zolpidem (AMBIEN) 10 MG tablet Take 10 mg by mouth at bedtime as needed.        Marland Kitchen EPINEPHrine (EPIPEN JR) 0.15 MG/0.3ML injection Inject 0.15 mg into the muscle as needed.          Results for orders placed during the hospital encounter of 11/04/11 (from the past 48 hour(s))  BASIC METABOLIC PANEL     Status: Abnormal   Collection Time   11/03/11  1:00 PM      Component Value Range Comment   Sodium 138  135 - 145 (mEq/L)    Potassium 4.0  3.5 - 5.1 (mEq/L)    Chloride 101  96 - 112 (mEq/L)    CO2 25  19 - 32 (mEq/L)    Glucose, Bld 92  70 - 99 (mg/dL)    BUN 17  6 - 23 (mg/dL)    Creatinine, Ser 1.61  0.50 - 1.10 (mg/dL)    Calcium 9.0  8.4 - 10.5 (mg/dL)    GFR calc non Af Amer 84 (*) >90 (mL/min)    GFR calc Af Amer >90  >90 (mL/min)      ROS  No recent f/c/n/v/wt loss.  Blood pressure 141/67, pulse 69, temperature 98.9 F (37.2 C), temperature source Oral, resp. rate 20, height 5\' 5"  (1.651 m), weight 83.462 kg (184 lb), SpO2 98.00%. Physical Exam  WN WD woman in  nad.  A and O x 4 mood and affect normal.  Respirations unlabored.  L foot with well healed surgical scars.  No signs of infection.  2nd and 3rd MT heads ttp.  Increased callous.  Feels LT throughout foot. 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  To OR for revision of the nonunion with calacaneal autograft.  Weil osteotomies of the 2nd and 3rd MTs to decrease overload.  Toni Arthurs 11/04/2011, 7:21 AM

## 2011-11-11 NOTE — Addendum Note (Signed)
Addendum  created 11/11/11 0808 by Trego Brittan Butterbaugh, CRNA   Modules edited:Anesthesia Responsible Staff    

## 2011-11-11 NOTE — Addendum Note (Signed)
Addendum  created 11/11/11 1914 by Lance Coon, CRNA   Modules edited:Anesthesia Responsible Staff

## 2011-11-12 ENCOUNTER — Encounter (HOSPITAL_BASED_OUTPATIENT_CLINIC_OR_DEPARTMENT_OTHER): Payer: Self-pay | Admitting: Orthopedic Surgery

## 2011-12-08 DIAGNOSIS — M1710 Unilateral primary osteoarthritis, unspecified knee: Secondary | ICD-10-CM | POA: Diagnosis not present

## 2011-12-13 DIAGNOSIS — M1710 Unilateral primary osteoarthritis, unspecified knee: Secondary | ICD-10-CM | POA: Diagnosis not present

## 2011-12-13 DIAGNOSIS — M25539 Pain in unspecified wrist: Secondary | ICD-10-CM | POA: Diagnosis not present

## 2011-12-17 DIAGNOSIS — M25569 Pain in unspecified knee: Secondary | ICD-10-CM | POA: Diagnosis not present

## 2011-12-17 DIAGNOSIS — M5137 Other intervertebral disc degeneration, lumbosacral region: Secondary | ICD-10-CM | POA: Diagnosis not present

## 2011-12-17 DIAGNOSIS — G8918 Other acute postprocedural pain: Secondary | ICD-10-CM | POA: Diagnosis not present

## 2011-12-17 DIAGNOSIS — M25539 Pain in unspecified wrist: Secondary | ICD-10-CM | POA: Diagnosis not present

## 2011-12-29 DIAGNOSIS — M7750 Other enthesopathy of unspecified foot: Secondary | ICD-10-CM | POA: Diagnosis not present

## 2012-01-10 DIAGNOSIS — M1710 Unilateral primary osteoarthritis, unspecified knee: Secondary | ICD-10-CM | POA: Diagnosis not present

## 2012-01-10 DIAGNOSIS — M25569 Pain in unspecified knee: Secondary | ICD-10-CM | POA: Diagnosis not present

## 2012-01-27 DIAGNOSIS — M79609 Pain in unspecified limb: Secondary | ICD-10-CM | POA: Diagnosis not present

## 2012-01-27 DIAGNOSIS — M25569 Pain in unspecified knee: Secondary | ICD-10-CM | POA: Diagnosis not present

## 2012-01-27 DIAGNOSIS — M961 Postlaminectomy syndrome, not elsewhere classified: Secondary | ICD-10-CM | POA: Diagnosis not present

## 2012-01-27 DIAGNOSIS — G8928 Other chronic postprocedural pain: Secondary | ICD-10-CM | POA: Diagnosis not present

## 2012-01-28 DIAGNOSIS — H3530 Unspecified macular degeneration: Secondary | ICD-10-CM | POA: Diagnosis not present

## 2012-01-31 DIAGNOSIS — IMO0002 Reserved for concepts with insufficient information to code with codable children: Secondary | ICD-10-CM | POA: Diagnosis not present

## 2012-02-02 DIAGNOSIS — H353290 Exudative age-related macular degeneration, unspecified eye, stage unspecified: Secondary | ICD-10-CM | POA: Diagnosis not present

## 2012-02-02 DIAGNOSIS — H43819 Vitreous degeneration, unspecified eye: Secondary | ICD-10-CM | POA: Diagnosis not present

## 2012-02-02 DIAGNOSIS — H353190 Nonexudative age-related macular degeneration, unspecified eye, stage unspecified: Secondary | ICD-10-CM | POA: Diagnosis not present

## 2012-02-02 DIAGNOSIS — H35059 Retinal neovascularization, unspecified, unspecified eye: Secondary | ICD-10-CM | POA: Diagnosis not present

## 2012-02-07 DIAGNOSIS — M7750 Other enthesopathy of unspecified foot: Secondary | ICD-10-CM | POA: Diagnosis not present

## 2012-02-09 DIAGNOSIS — M7750 Other enthesopathy of unspecified foot: Secondary | ICD-10-CM | POA: Diagnosis not present

## 2012-02-11 DIAGNOSIS — M7750 Other enthesopathy of unspecified foot: Secondary | ICD-10-CM | POA: Diagnosis not present

## 2012-02-14 DIAGNOSIS — IMO0002 Reserved for concepts with insufficient information to code with codable children: Secondary | ICD-10-CM | POA: Diagnosis not present

## 2012-02-18 DIAGNOSIS — H353290 Exudative age-related macular degeneration, unspecified eye, stage unspecified: Secondary | ICD-10-CM | POA: Diagnosis not present

## 2012-02-18 DIAGNOSIS — M7750 Other enthesopathy of unspecified foot: Secondary | ICD-10-CM | POA: Diagnosis not present

## 2012-02-21 DIAGNOSIS — IMO0002 Reserved for concepts with insufficient information to code with codable children: Secondary | ICD-10-CM | POA: Diagnosis not present

## 2012-02-23 DIAGNOSIS — IMO0002 Reserved for concepts with insufficient information to code with codable children: Secondary | ICD-10-CM | POA: Diagnosis not present

## 2012-02-25 DIAGNOSIS — IMO0002 Reserved for concepts with insufficient information to code with codable children: Secondary | ICD-10-CM | POA: Diagnosis not present

## 2012-02-28 DIAGNOSIS — IMO0002 Reserved for concepts with insufficient information to code with codable children: Secondary | ICD-10-CM | POA: Diagnosis not present

## 2012-03-01 DIAGNOSIS — M7750 Other enthesopathy of unspecified foot: Secondary | ICD-10-CM | POA: Diagnosis not present

## 2012-03-07 DIAGNOSIS — M7750 Other enthesopathy of unspecified foot: Secondary | ICD-10-CM | POA: Diagnosis not present

## 2012-03-09 DIAGNOSIS — M961 Postlaminectomy syndrome, not elsewhere classified: Secondary | ICD-10-CM | POA: Diagnosis not present

## 2012-03-09 DIAGNOSIS — M25569 Pain in unspecified knee: Secondary | ICD-10-CM | POA: Diagnosis not present

## 2012-03-09 DIAGNOSIS — M79609 Pain in unspecified limb: Secondary | ICD-10-CM | POA: Diagnosis not present

## 2012-03-09 DIAGNOSIS — IMO0002 Reserved for concepts with insufficient information to code with codable children: Secondary | ICD-10-CM | POA: Diagnosis not present

## 2012-03-10 DIAGNOSIS — M7750 Other enthesopathy of unspecified foot: Secondary | ICD-10-CM | POA: Diagnosis not present

## 2012-03-16 DIAGNOSIS — M7750 Other enthesopathy of unspecified foot: Secondary | ICD-10-CM | POA: Diagnosis not present

## 2012-03-17 DIAGNOSIS — M7750 Other enthesopathy of unspecified foot: Secondary | ICD-10-CM | POA: Diagnosis not present

## 2012-03-17 DIAGNOSIS — H353190 Nonexudative age-related macular degeneration, unspecified eye, stage unspecified: Secondary | ICD-10-CM | POA: Diagnosis not present

## 2012-03-17 DIAGNOSIS — H35059 Retinal neovascularization, unspecified, unspecified eye: Secondary | ICD-10-CM | POA: Diagnosis not present

## 2012-03-17 DIAGNOSIS — H353290 Exudative age-related macular degeneration, unspecified eye, stage unspecified: Secondary | ICD-10-CM | POA: Diagnosis not present

## 2012-03-17 DIAGNOSIS — H43819 Vitreous degeneration, unspecified eye: Secondary | ICD-10-CM | POA: Diagnosis not present

## 2012-03-22 DIAGNOSIS — M7750 Other enthesopathy of unspecified foot: Secondary | ICD-10-CM | POA: Diagnosis not present

## 2012-04-24 DIAGNOSIS — M7750 Other enthesopathy of unspecified foot: Secondary | ICD-10-CM | POA: Diagnosis not present

## 2012-04-24 DIAGNOSIS — M961 Postlaminectomy syndrome, not elsewhere classified: Secondary | ICD-10-CM | POA: Diagnosis not present

## 2012-04-28 ENCOUNTER — Other Ambulatory Visit: Payer: Self-pay | Admitting: Obstetrics & Gynecology

## 2012-04-28 DIAGNOSIS — H35059 Retinal neovascularization, unspecified, unspecified eye: Secondary | ICD-10-CM | POA: Diagnosis not present

## 2012-04-28 DIAGNOSIS — Z1231 Encounter for screening mammogram for malignant neoplasm of breast: Secondary | ICD-10-CM

## 2012-04-28 DIAGNOSIS — H353290 Exudative age-related macular degeneration, unspecified eye, stage unspecified: Secondary | ICD-10-CM | POA: Diagnosis not present

## 2012-05-04 DIAGNOSIS — M961 Postlaminectomy syndrome, not elsewhere classified: Secondary | ICD-10-CM | POA: Diagnosis not present

## 2012-05-04 DIAGNOSIS — M79609 Pain in unspecified limb: Secondary | ICD-10-CM | POA: Diagnosis not present

## 2012-05-04 DIAGNOSIS — IMO0002 Reserved for concepts with insufficient information to code with codable children: Secondary | ICD-10-CM | POA: Diagnosis not present

## 2012-05-18 ENCOUNTER — Other Ambulatory Visit: Payer: Self-pay | Admitting: Obstetrics & Gynecology

## 2012-05-18 DIAGNOSIS — Z0000 Encounter for general adult medical examination without abnormal findings: Secondary | ICD-10-CM | POA: Diagnosis not present

## 2012-05-18 DIAGNOSIS — N63 Unspecified lump in unspecified breast: Secondary | ICD-10-CM

## 2012-05-18 DIAGNOSIS — Z124 Encounter for screening for malignant neoplasm of cervix: Secondary | ICD-10-CM | POA: Diagnosis not present

## 2012-05-18 DIAGNOSIS — Z01419 Encounter for gynecological examination (general) (routine) without abnormal findings: Secondary | ICD-10-CM | POA: Diagnosis not present

## 2012-05-25 ENCOUNTER — Ambulatory Visit
Admission: RE | Admit: 2012-05-25 | Discharge: 2012-05-25 | Disposition: A | Discharge status: Home / Self Care | Payer: Medicare Other | Source: Ambulatory Visit | Attending: Obstetrics & Gynecology | Admitting: Obstetrics & Gynecology

## 2012-05-25 DIAGNOSIS — N63 Unspecified lump in unspecified breast: Secondary | ICD-10-CM

## 2012-05-29 ENCOUNTER — Ambulatory Visit (HOSPITAL_COMMUNITY): Payer: Medicare Other

## 2012-06-05 DIAGNOSIS — R142 Eructation: Secondary | ICD-10-CM | POA: Diagnosis not present

## 2012-06-05 DIAGNOSIS — D259 Leiomyoma of uterus, unspecified: Secondary | ICD-10-CM | POA: Diagnosis not present

## 2012-06-05 DIAGNOSIS — N8111 Cystocele, midline: Secondary | ICD-10-CM | POA: Diagnosis not present

## 2012-06-05 DIAGNOSIS — R141 Gas pain: Secondary | ICD-10-CM | POA: Diagnosis not present

## 2012-06-05 DIAGNOSIS — N83339 Acquired atrophy of ovary and fallopian tube, unspecified side: Secondary | ICD-10-CM | POA: Diagnosis not present

## 2012-06-05 DIAGNOSIS — D252 Subserosal leiomyoma of uterus: Secondary | ICD-10-CM | POA: Diagnosis not present

## 2012-06-05 DIAGNOSIS — R143 Flatulence: Secondary | ICD-10-CM | POA: Diagnosis not present

## 2012-06-30 DIAGNOSIS — M961 Postlaminectomy syndrome, not elsewhere classified: Secondary | ICD-10-CM | POA: Diagnosis not present

## 2012-06-30 DIAGNOSIS — M25569 Pain in unspecified knee: Secondary | ICD-10-CM | POA: Diagnosis not present

## 2012-06-30 DIAGNOSIS — M19049 Primary osteoarthritis, unspecified hand: Secondary | ICD-10-CM | POA: Diagnosis not present

## 2012-07-26 DIAGNOSIS — M1710 Unilateral primary osteoarthritis, unspecified knee: Secondary | ICD-10-CM | POA: Diagnosis not present

## 2012-07-26 DIAGNOSIS — M25569 Pain in unspecified knee: Secondary | ICD-10-CM | POA: Diagnosis not present

## 2012-07-28 DIAGNOSIS — M25559 Pain in unspecified hip: Secondary | ICD-10-CM | POA: Diagnosis not present

## 2012-07-28 DIAGNOSIS — M79609 Pain in unspecified limb: Secondary | ICD-10-CM | POA: Diagnosis not present

## 2012-07-28 DIAGNOSIS — M25569 Pain in unspecified knee: Secondary | ICD-10-CM | POA: Diagnosis not present

## 2012-07-28 DIAGNOSIS — M961 Postlaminectomy syndrome, not elsewhere classified: Secondary | ICD-10-CM | POA: Diagnosis not present

## 2012-08-02 DIAGNOSIS — M1710 Unilateral primary osteoarthritis, unspecified knee: Secondary | ICD-10-CM | POA: Diagnosis not present

## 2012-08-02 DIAGNOSIS — M25579 Pain in unspecified ankle and joints of unspecified foot: Secondary | ICD-10-CM | POA: Diagnosis not present

## 2012-08-11 ENCOUNTER — Other Ambulatory Visit: Payer: Self-pay | Admitting: Orthopaedic Surgery

## 2012-08-11 DIAGNOSIS — M1710 Unilateral primary osteoarthritis, unspecified knee: Secondary | ICD-10-CM | POA: Diagnosis not present

## 2012-08-15 ENCOUNTER — Encounter (HOSPITAL_COMMUNITY): Payer: Self-pay | Admitting: Pharmacy Technician

## 2012-08-16 DIAGNOSIS — M1710 Unilateral primary osteoarthritis, unspecified knee: Secondary | ICD-10-CM | POA: Diagnosis not present

## 2012-08-18 ENCOUNTER — Encounter (HOSPITAL_COMMUNITY)
Admission: RE | Admit: 2012-08-18 | Discharge: 2012-08-18 | Disposition: A | Discharge status: Home / Self Care | Payer: Medicare Other | Source: Ambulatory Visit | Attending: Orthopaedic Surgery | Admitting: Orthopaedic Surgery

## 2012-08-18 ENCOUNTER — Ambulatory Visit (HOSPITAL_COMMUNITY)
Admission: RE | Admit: 2012-08-18 | Discharge: 2012-08-18 | Disposition: A | Discharge status: Home / Self Care | Payer: Medicare Other | Source: Ambulatory Visit | Attending: Orthopaedic Surgery | Admitting: Orthopaedic Surgery

## 2012-08-18 ENCOUNTER — Encounter (HOSPITAL_COMMUNITY): Payer: Self-pay

## 2012-08-18 DIAGNOSIS — Z01818 Encounter for other preprocedural examination: Secondary | ICD-10-CM | POA: Diagnosis not present

## 2012-08-18 DIAGNOSIS — Z01810 Encounter for preprocedural cardiovascular examination: Secondary | ICD-10-CM | POA: Insufficient documentation

## 2012-08-18 DIAGNOSIS — J40 Bronchitis, not specified as acute or chronic: Secondary | ICD-10-CM | POA: Diagnosis not present

## 2012-08-18 DIAGNOSIS — Z01812 Encounter for preprocedural laboratory examination: Secondary | ICD-10-CM | POA: Diagnosis not present

## 2012-08-18 DIAGNOSIS — J984 Other disorders of lung: Secondary | ICD-10-CM | POA: Diagnosis not present

## 2012-08-18 DIAGNOSIS — Z01811 Encounter for preprocedural respiratory examination: Secondary | ICD-10-CM | POA: Diagnosis not present

## 2012-08-18 HISTORY — DX: Bronchitis, not specified as acute or chronic: J40

## 2012-08-18 HISTORY — DX: Reserved for concepts with insufficient information to code with codable children: IMO0002

## 2012-08-18 LAB — BASIC METABOLIC PANEL
BUN: 20 mg/dL (ref 6–23)
CO2: 29 mEq/L (ref 19–32)
Calcium: 10 mg/dL (ref 8.4–10.5)
Chloride: 99 mEq/L (ref 96–112)
Creatinine, Ser: 0.77 mg/dL (ref 0.50–1.10)

## 2012-08-18 LAB — CBC WITH DIFFERENTIAL/PLATELET
Basophils Absolute: 0.1 10*3/uL (ref 0.0–0.1)
Basophils Relative: 1 % (ref 0–1)
Eosinophils Absolute: 0.2 10*3/uL (ref 0.0–0.7)
Eosinophils Relative: 3 % (ref 0–5)
HCT: 39.4 % (ref 36.0–46.0)
MCH: 31.9 pg (ref 26.0–34.0)
MCHC: 34.8 g/dL (ref 30.0–36.0)
MCV: 91.8 fL (ref 78.0–100.0)
Monocytes Absolute: 0.3 10*3/uL (ref 0.1–1.0)
RDW: 12.8 % (ref 11.5–15.5)

## 2012-08-18 LAB — URINALYSIS, ROUTINE W REFLEX MICROSCOPIC
Glucose, UA: NEGATIVE mg/dL
Hgb urine dipstick: NEGATIVE
Specific Gravity, Urine: 1.013 (ref 1.005–1.030)
Urobilinogen, UA: 0.2 mg/dL (ref 0.0–1.0)
pH: 5.5 (ref 5.0–8.0)

## 2012-08-18 LAB — URINE MICROSCOPIC-ADD ON

## 2012-08-18 LAB — TYPE AND SCREEN: Antibody Screen: NEGATIVE

## 2012-08-18 MED ORDER — CHLORHEXIDINE GLUCONATE 4 % EX LIQD: 60.0000 mL | Freq: Once | CUTANEOUS | Status: DC

## 2012-08-18 NOTE — Pre-Procedure Instructions (Addendum)
20 [name] [name]  08/18/2012   Your procedure is scheduled on:  08/29/12  Report to [id number] AM.  Call this number if you have problems the morning of surgery: [phone]   Remember:   Do not eat food or drink:After Midnight.    Take these medicines the morning of surgery with A SIP OF WATER: gabapentin,synthroid, robaxin, metoprolol,pain med,omeprazole , methadone STOP  Aspirin, celebrex, flaxseed, meloxicam, multi vit on 08/22/12   Do not wear jewelry, make-up or nail polish.  Do not wear lotions, powders, or perfumes. You may wear deodorant.  Do not shave 48 hours prior to surgery. Men may shave face and neck.  Do not bring valuables to the hospital.  Contacts, dentures or bridgework may not be worn into surgery.  Leave suitcase in the car. After surgery it may be brought to your room.  For patients admitted to the hospital, checkout time is 11:00 AM the day of discharge.   Patients discharged the day of surgery will not be allowed to drive home.  Name and phone number of your driver: jim spouse [phone]  Special Instructions: Incentive Spirometry - Practice and bring it with you on the day of surgery. and CHG Shower Use Special Wash: 1/2 bottle night before surgery and 1/2 bottle morning of surgery.   Please read over the following fact sheets that you were given: Pain Booklet, Coughing and Deep Breathing, Blood Transfusion Information, MRSA Information and Surgical Site Infection Prevention

## 2012-08-19 DIAGNOSIS — Z23 Encounter for immunization: Secondary | ICD-10-CM | POA: Diagnosis not present

## 2012-08-22 DIAGNOSIS — M25569 Pain in unspecified knee: Secondary | ICD-10-CM | POA: Diagnosis not present

## 2012-08-22 DIAGNOSIS — M961 Postlaminectomy syndrome, not elsewhere classified: Secondary | ICD-10-CM | POA: Diagnosis not present

## 2012-08-22 DIAGNOSIS — M79609 Pain in unspecified limb: Secondary | ICD-10-CM | POA: Diagnosis not present

## 2012-08-22 DIAGNOSIS — IMO0002 Reserved for concepts with insufficient information to code with codable children: Secondary | ICD-10-CM | POA: Diagnosis not present

## 2012-08-23 DIAGNOSIS — M1710 Unilateral primary osteoarthritis, unspecified knee: Secondary | ICD-10-CM | POA: Diagnosis not present

## 2012-08-25 NOTE — H&P (Signed)
[name] [name] is an 71 y.o. female.   Chief Complaint: right knee pain HPI: [name] [name] is a patient well known to our practice who has had multiple months of increasing right knee pain with activity and now with rest.  She has undergone 2 series of viscose supplementation injections which only afforded her minimal temporary relief.  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  Due to the fact she is having limited ability to rest comfortably and walk comfortably we have discussed proceeding with a right total knee replacement to help alleviate the symptoms and restore function.  Past Medical History  Diagnosis Date  . Complication of anesthesia     slow to wake up-sats drop  . Hypertension   . Hypothyroidism   . Arthritis   . GERD (gastroesophageal reflux disease)   . Blood transfusion   . Chronic pain   . PONV (postoperative nausea and vomiting)     states she had scop patch in past  . Bronchitis     finishing rx  now  . Ulcer     hx  . H/O hiatal hernia     Past Surgical History  Procedure Date  . Hammer toe surgery 4/12    lt foot-2-3  . Appendectomy   . Metatarsal osteotomy 3/12    lt  . Thumb fusion 2008    rt  . Carpal tunnel release     both  . Knee arthroscopy 2007    both  . Back surgery 09,00,10,99    4 back surg  . Orif toe fracture 11/04/2011    Procedure: OPEN REDUCTION INTERNAL FIXATION (ORIF) METATARSAL (TOE) FRACTURE;  Surgeon: Toni Arthurs, MD;  Location: [facility];  Service: Orthopedics;  Laterality: Left;  left revision orif 1st metatarsal with autograft from left calcaneous and left 2nd-3rd metatarsal weil osteotomy  . 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 Dilation and curettage of uterus   . Breast surgery     cyst    No family history on file. Social History:  reports that she has never smoked. She does not have any smokeless tobacco history on file. She reports that she does not use illicit drugs. Her alcohol history not on  file.  Allergies:  Allergies  Allergen Reactions  . Fish Allergy Swelling    [place]   (fake crab) Face swelled  . Flexeril (Cyclobenzaprine) Hives  . Ciprocin-Fluocin-Procin (Fluocinolone Acetonide) Hives  . Eggs Or Egg-Derived Products Nausea And Vomiting  . Latex Other (See Comments)    Blisters.  . Lisinopril Hives  . Morphine And Related Nausea Only    Does not work.   . Sulfa Antibiotics Hives    Medications: Celebrex, Lipitor, Toprol, Synthroid, Lasix, Neurontin, Ambien, ASA 81 mg, methadone and oxycodone  No results found for this or any previous visit (from the past 48 hour(s)). No results found.  Review of Systems  Constitutional: Negative.   HENT: Negative.   Eyes: Negative.   Respiratory: Negative.   Cardiovascular: Negative.   Gastrointestinal: Negative.   Genitourinary: Negative.   Musculoskeletal: Negative.   Skin: Negative.   Neurological: Negative.   Endo/Heme/Allergies: Negative.   Psychiatric/Behavioral: Negative.     There were no vitals taken for this visit. Physical Exam  Constitutional: She is oriented to person, place, and time. She appears well-nourished.  HENT:  Head: Atraumatic.  Eyes: EOM are normal.  Neck: Neck supple.  Cardiovascular: Regular rhythm.   Respiratory: Breath  sounds normal.  GI: Bowel sounds are normal.  Musculoskeletal:       Right knee exam: Moderate valgus deformity.  Crepitation and range of motion from 0-1 10 there is no significant effusion.  Pain along the medial joint line and patellofemoral area to palpation.  Hip motion full and pain-free.  Neurological: She is oriented to person, place, and time.  Skin: Skin is dry.  Psychiatric: She has a normal mood and affect.     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  Have  also discussed with her the risk of anesthesia, infection and DVT associated with knee replacement surgery.  We have stressed the importance of postoperative physical rehabilitation as well.  [name] [name] 08/25/2012, 11:57 AM

## 2012-08-28 DIAGNOSIS — M1710 Unilateral primary osteoarthritis, unspecified knee: Secondary | ICD-10-CM | POA: Diagnosis not present

## 2012-08-28 MED ORDER — CEFAZOLIN SODIUM-DEXTROSE 2-3 GM-% IV SOLR
2.0000 g | INTRAVENOUS | Status: AC
  Administered 2012-08-29: 2 g via INTRAVENOUS
  Filled 2012-08-28: qty 50

## 2012-08-28 MED ORDER — LACTATED RINGERS IV SOLN
INTRAVENOUS | Status: DC
  Administered 2012-08-29: 10:00:00 via INTRAVENOUS

## 2012-08-28 NOTE — Progress Notes (Signed)
Patient called to arrive at 845. Message left

## 2012-08-29 ENCOUNTER — Inpatient Hospital Stay (HOSPITAL_COMMUNITY)
Admission: RE | Admit: 2012-08-29 | Discharge: 2012-09-01 | DRG: 470 | Disposition: A | Discharge status: Home Health | Payer: Medicare Other | Source: Ambulatory Visit | Attending: Orthopaedic Surgery | Admitting: Orthopaedic Surgery

## 2012-08-29 ENCOUNTER — Encounter (HOSPITAL_COMMUNITY)
Admission: RE | Disposition: A | Discharge status: Home Health | Payer: Self-pay | Source: Ambulatory Visit | Attending: Orthopaedic Surgery

## 2012-08-29 ENCOUNTER — Encounter (HOSPITAL_COMMUNITY): Payer: Self-pay | Admitting: Anesthesiology

## 2012-08-29 ENCOUNTER — Inpatient Hospital Stay (HOSPITAL_COMMUNITY): Payer: Medicare Other | Admitting: Anesthesiology

## 2012-08-29 ENCOUNTER — Encounter (HOSPITAL_COMMUNITY): Payer: Self-pay | Admitting: *Deleted

## 2012-08-29 DIAGNOSIS — I10 Essential (primary) hypertension: Secondary | ICD-10-CM | POA: Diagnosis not present

## 2012-08-29 DIAGNOSIS — G8918 Other acute postprocedural pain: Secondary | ICD-10-CM | POA: Diagnosis not present

## 2012-08-29 DIAGNOSIS — Z7982 Long term (current) use of aspirin: Secondary | ICD-10-CM | POA: Diagnosis not present

## 2012-08-29 DIAGNOSIS — K219 Gastro-esophageal reflux disease without esophagitis: Secondary | ICD-10-CM | POA: Diagnosis present

## 2012-08-29 DIAGNOSIS — Z79899 Other long term (current) drug therapy: Secondary | ICD-10-CM

## 2012-08-29 DIAGNOSIS — M25569 Pain in unspecified knee: Secondary | ICD-10-CM | POA: Diagnosis not present

## 2012-08-29 DIAGNOSIS — M1711 Unilateral primary osteoarthritis, right knee: Secondary | ICD-10-CM

## 2012-08-29 DIAGNOSIS — M1710 Unilateral primary osteoarthritis, unspecified knee: Principal | ICD-10-CM | POA: Diagnosis present

## 2012-08-29 DIAGNOSIS — E039 Hypothyroidism, unspecified: Secondary | ICD-10-CM | POA: Diagnosis present

## 2012-08-29 HISTORY — PX: TOTAL KNEE ARTHROPLASTY: SHX125

## 2012-08-29 SURGERY — ARTHROPLASTY, KNEE, TOTAL
Anesthesia: General | Site: Knee | Laterality: Right | Wound class: Clean

## 2012-08-29 MED ORDER — METOCLOPRAMIDE HCL 10 MG PO TABS: 5.0000 mg | ORAL_TABLET | Freq: Three times a day (TID) | ORAL | Status: DC | PRN

## 2012-08-29 MED ORDER — ACETAMINOPHEN 10 MG/ML IV SOLN
INTRAVENOUS | Status: AC
  Filled 2012-08-29: qty 100

## 2012-08-29 MED ORDER — PHENOL 1.4 % MT LIQD
1.0000 | OROMUCOSAL | Status: DC | PRN
  Filled 2012-08-29: qty 177

## 2012-08-29 MED ORDER — DOCUSATE SODIUM 100 MG PO CAPS
100.0000 mg | ORAL_CAPSULE | Freq: Two times a day (BID) | ORAL | Status: DC
  Administered 2012-08-29 – 2012-09-01 (×6): 100 mg via ORAL
  Filled 2012-08-29 (×7): qty 1

## 2012-08-29 MED ORDER — METHOCARBAMOL 500 MG PO TABS
500.0000 mg | ORAL_TABLET | Freq: Four times a day (QID) | ORAL | Status: DC | PRN
  Administered 2012-08-29 – 2012-08-31 (×4): 500 mg via ORAL
  Filled 2012-08-29 (×5): qty 1

## 2012-08-29 MED ORDER — LACTATED RINGERS IV SOLN
INTRAVENOUS | Status: DC
  Administered 2012-08-29: 17:00:00 via INTRAVENOUS

## 2012-08-29 MED ORDER — DIPHENHYDRAMINE HCL 12.5 MG/5ML PO ELIX: 12.5000 mg | ORAL_SOLUTION | ORAL | Status: DC | PRN

## 2012-08-29 MED ORDER — ACETAMINOPHEN 10 MG/ML IV SOLN: 1000.0000 mg | Freq: Once | INTRAVENOUS | Status: DC | PRN

## 2012-08-29 MED ORDER — FENTANYL CITRATE 0.05 MG/ML IJ SOLN
INTRAMUSCULAR | Status: DC | PRN
  Administered 2012-08-29: 50 ug via INTRAVENOUS
  Administered 2012-08-29: 100 ug via INTRAVENOUS

## 2012-08-29 MED ORDER — ONDANSETRON HCL 4 MG/2ML IJ SOLN: 4.0000 mg | Freq: Once | INTRAMUSCULAR | Status: DC | PRN

## 2012-08-29 MED ORDER — CEFAZOLIN SODIUM-DEXTROSE 2-3 GM-% IV SOLR
2.0000 g | Freq: Four times a day (QID) | INTRAVENOUS | Status: AC
  Administered 2012-08-29 – 2012-08-30 (×2): 2 g via INTRAVENOUS
  Filled 2012-08-29 (×2): qty 50

## 2012-08-29 MED ORDER — ONDANSETRON HCL 4 MG/2ML IJ SOLN: 4.0000 mg | Freq: Four times a day (QID) | INTRAMUSCULAR | Status: DC | PRN

## 2012-08-29 MED ORDER — PANTOPRAZOLE SODIUM 40 MG PO TBEC
40.0000 mg | DELAYED_RELEASE_TABLET | Freq: Every day | ORAL | Status: DC
  Administered 2012-08-30 – 2012-09-01 (×3): 40 mg via ORAL
  Filled 2012-08-29 (×2): qty 1

## 2012-08-29 MED ORDER — LEVOTHYROXINE SODIUM 75 MCG PO TABS
75.0000 ug | ORAL_TABLET | Freq: Every day | ORAL | Status: DC
  Administered 2012-08-30 – 2012-09-01 (×3): 75 ug via ORAL
  Filled 2012-08-29 (×4): qty 1

## 2012-08-29 MED ORDER — OXYCODONE-ACETAMINOPHEN 5-325 MG PO TABS
1.0000 | ORAL_TABLET | ORAL | Status: DC | PRN
  Filled 2012-08-29: qty 2

## 2012-08-29 MED ORDER — LIDOCAINE HCL (CARDIAC) 20 MG/ML IV SOLN
INTRAVENOUS | Status: DC | PRN
  Administered 2012-08-29: 40 mg via INTRAVENOUS

## 2012-08-29 MED ORDER — GABAPENTIN 600 MG PO TABS
600.0000 mg | ORAL_TABLET | Freq: Three times a day (TID) | ORAL | Status: DC
  Administered 2012-08-29 – 2012-09-01 (×9): 600 mg via ORAL
  Filled 2012-08-29 (×11): qty 1

## 2012-08-29 MED ORDER — CEFUROXIME SODIUM 1.5 G IJ SOLR
INTRAMUSCULAR | Status: AC
  Filled 2012-08-29: qty 1.5

## 2012-08-29 MED ORDER — FUROSEMIDE 40 MG PO TABS
40.0000 mg | ORAL_TABLET | Freq: Every day | ORAL | Status: DC
  Administered 2012-08-29 – 2012-09-01 (×4): 40 mg via ORAL
  Filled 2012-08-29 (×4): qty 1

## 2012-08-29 MED ORDER — METHADONE HCL 5 MG PO TABS
5.0000 mg | ORAL_TABLET | Freq: Four times a day (QID) | ORAL | Status: DC
  Administered 2012-08-30 – 2012-09-01 (×11): 5 mg via ORAL
  Filled 2012-08-29 (×3): qty 1
  Filled 2012-08-29: qty 2
  Filled 2012-08-29 (×4): qty 1

## 2012-08-29 MED ORDER — ACETAMINOPHEN 10 MG/ML IV SOLN
INTRAVENOUS | Status: DC | PRN
  Administered 2012-08-29: 1000 mg via INTRAVENOUS

## 2012-08-29 MED ORDER — FLEET ENEMA 7-19 GM/118ML RE ENEM: 1.0000 | ENEMA | Freq: Once | RECTAL | Status: AC | PRN

## 2012-08-29 MED ORDER — HYDROMORPHONE HCL PF 1 MG/ML IJ SOLN
0.5000 mg | INTRAMUSCULAR | Status: DC | PRN
  Administered 2012-08-29 – 2012-08-30 (×4): 1 mg via INTRAVENOUS
  Filled 2012-08-29 (×4): qty 1

## 2012-08-29 MED ORDER — ONDANSETRON HCL 4 MG PO TABS: 4.0000 mg | ORAL_TABLET | Freq: Four times a day (QID) | ORAL | Status: DC | PRN

## 2012-08-29 MED ORDER — METOCLOPRAMIDE HCL 5 MG/ML IJ SOLN: 5.0000 mg | Freq: Three times a day (TID) | INTRAMUSCULAR | Status: DC | PRN

## 2012-08-29 MED ORDER — HYDROMORPHONE HCL PF 1 MG/ML IJ SOLN
INTRAMUSCULAR | Status: AC
  Administered 2012-08-29: 1 mg via INTRAVENOUS
  Filled 2012-08-29: qty 1

## 2012-08-29 MED ORDER — OXYCODONE HCL 5 MG PO TABS
5.0000 mg | ORAL_TABLET | ORAL | Status: DC | PRN
  Administered 2012-08-29 – 2012-09-01 (×5): 10 mg via ORAL
  Filled 2012-08-29 (×5): qty 2

## 2012-08-29 MED ORDER — LORATADINE 10 MG PO TABS
10.0000 mg | ORAL_TABLET | Freq: Every day | ORAL | Status: DC
  Administered 2012-08-29 – 2012-09-01 (×4): 10 mg via ORAL
  Filled 2012-08-29 (×4): qty 1

## 2012-08-29 MED ORDER — LACTATED RINGERS IV SOLN
INTRAVENOUS | Status: DC | PRN
  Administered 2012-08-29 (×2): via INTRAVENOUS

## 2012-08-29 MED ORDER — ZOLPIDEM TARTRATE 5 MG PO TABS
5.0000 mg | ORAL_TABLET | Freq: Every evening | ORAL | Status: DC | PRN
  Filled 2012-08-29 (×2): qty 1

## 2012-08-29 MED ORDER — ACETAMINOPHEN 650 MG RE SUPP: 650.0000 mg | Freq: Four times a day (QID) | RECTAL | Status: DC | PRN

## 2012-08-29 MED ORDER — MIDAZOLAM HCL 5 MG/5ML IJ SOLN
INTRAMUSCULAR | Status: DC | PRN
  Administered 2012-08-29: 1 mg via INTRAVENOUS

## 2012-08-29 MED ORDER — METOPROLOL SUCCINATE ER 25 MG PO TB24
25.0000 mg | ORAL_TABLET | Freq: Every day | ORAL | Status: DC
  Administered 2012-08-30 – 2012-09-01 (×3): 25 mg via ORAL
  Filled 2012-08-29 (×4): qty 1

## 2012-08-29 MED ORDER — CALCIUM CARBONATE 1250 (500 CA) MG PO TABS
1.0000 | ORAL_TABLET | Freq: Every day | ORAL | Status: DC
  Administered 2012-08-29 – 2012-09-01 (×4): 500 mg via ORAL
  Filled 2012-08-29 (×4): qty 1

## 2012-08-29 MED ORDER — SODIUM CHLORIDE 0.9 % IV SOLN
200.0000 ug | INTRAVENOUS | Status: DC | PRN
  Administered 2012-08-29: 8 ug via INTRAVENOUS

## 2012-08-29 MED ORDER — ASPIRIN EC 325 MG PO TBEC
325.0000 mg | DELAYED_RELEASE_TABLET | Freq: Two times a day (BID) | ORAL | Status: DC
  Administered 2012-08-30 – 2012-09-01 (×5): 325 mg via ORAL
  Filled 2012-08-29 (×7): qty 1

## 2012-08-29 MED ORDER — MENTHOL 3 MG MT LOZG: 1.0000 | LOZENGE | OROMUCOSAL | Status: DC | PRN

## 2012-08-29 MED ORDER — SODIUM CHLORIDE 0.9 % IR SOLN
Status: DC | PRN
  Administered 2012-08-29: 3000 mL

## 2012-08-29 MED ORDER — METHOCARBAMOL 100 MG/ML IJ SOLN
500.0000 mg | Freq: Four times a day (QID) | INTRAVENOUS | Status: DC | PRN
  Administered 2012-08-29: 500 mg via INTRAVENOUS
  Filled 2012-08-29: qty 5

## 2012-08-29 MED ORDER — SENNOSIDES 8.6 MG PO TABS
1.0000 | ORAL_TABLET | Freq: Two times a day (BID) | ORAL | Status: DC
  Administered 2012-08-30 – 2012-09-01 (×5): 8.6 mg via ORAL
  Filled 2012-08-29 (×6): qty 1

## 2012-08-29 MED ORDER — ONDANSETRON HCL 4 MG/2ML IJ SOLN
INTRAMUSCULAR | Status: DC | PRN
  Administered 2012-08-29 (×2): 4 mg via INTRAVENOUS

## 2012-08-29 MED ORDER — ACETAMINOPHEN 325 MG PO TABS
650.0000 mg | ORAL_TABLET | Freq: Four times a day (QID) | ORAL | Status: DC | PRN
  Administered 2012-08-29 – 2012-08-30 (×2): 650 mg via ORAL
  Filled 2012-08-29 (×3): qty 2

## 2012-08-29 MED ORDER — EPHEDRINE SULFATE 50 MG/ML IJ SOLN
INTRAMUSCULAR | Status: DC | PRN
  Administered 2012-08-29: 5 mg via INTRAVENOUS
  Administered 2012-08-29: 10 mg via INTRAVENOUS
  Administered 2012-08-29: 5 mg via INTRAVENOUS
  Administered 2012-08-29: 10 mg via INTRAVENOUS

## 2012-08-29 MED ORDER — CEFUROXIME SODIUM 1.5 G IJ SOLR
INTRAMUSCULAR | Status: DC | PRN
  Administered 2012-08-29: 1.5 g

## 2012-08-29 MED ORDER — ADULT MULTIVITAMIN W/MINERALS CH
1.0000 | ORAL_TABLET | Freq: Every day | ORAL | Status: DC
  Administered 2012-08-29 – 2012-09-01 (×4): 1 via ORAL
  Filled 2012-08-29 (×4): qty 1

## 2012-08-29 MED ORDER — HYDROMORPHONE HCL PF 1 MG/ML IJ SOLN
0.2500 mg | INTRAMUSCULAR | Status: DC | PRN
  Administered 2012-08-29 (×2): 0.5 mg via INTRAVENOUS

## 2012-08-29 MED ORDER — BISACODYL 5 MG PO TBEC: 5.0000 mg | DELAYED_RELEASE_TABLET | Freq: Every day | ORAL | Status: DC | PRN

## 2012-08-29 MED ORDER — PROPOFOL 10 MG/ML IV BOLUS
INTRAVENOUS | Status: DC | PRN
Start: 2012-08-29 — End: 2012-08-29
  Administered 2012-08-29: 160 mg via INTRAVENOUS

## 2012-08-29 SURGICAL SUPPLY — 62 items
BANDAGE ELASTIC 4 VELCRO ST LF (GAUZE/BANDAGES/DRESSINGS) ×2 IMPLANT
BANDAGE ELASTIC 6 VELCRO ST LF (GAUZE/BANDAGES/DRESSINGS) ×1 IMPLANT
BANDAGE ESMARK 6X9 LF (GAUZE/BANDAGES/DRESSINGS) ×1 IMPLANT
BANDAGE GAUZE ELAST BULKY 4 IN (GAUZE/BANDAGES/DRESSINGS) ×3 IMPLANT
BLADE SAGITTAL 25.0X1.19X90 (BLADE) ×2 IMPLANT
BLADE SURG ROTATE 9660 (MISCELLANEOUS) IMPLANT
BNDG CMPR 9X6 STRL LF SNTH (GAUZE/BANDAGES/DRESSINGS) ×1
BNDG CMPR MED 10X6 ELC LF (GAUZE/BANDAGES/DRESSINGS) ×1
BNDG ELASTIC 6X10 VLCR STRL LF (GAUZE/BANDAGES/DRESSINGS) ×2 IMPLANT
BNDG ESMARK 6X9 LF (GAUZE/BANDAGES/DRESSINGS) ×2
BOWL SMART MIX CTS (DISPOSABLE) ×2 IMPLANT
CEMENT HV SMART SET (Cement) ×4 IMPLANT
CLOTH BEACON ORANGE TIMEOUT ST (SAFETY) ×2 IMPLANT
COVER SURGICAL LIGHT HANDLE (MISCELLANEOUS) ×2 IMPLANT
CUFF TOURNIQUET SINGLE 34IN LL (TOURNIQUET CUFF) ×2 IMPLANT
CUFF TOURNIQUET SINGLE 44IN (TOURNIQUET CUFF) IMPLANT
DRAPE EXTREMITY T 121X128X90 (DRAPE) ×2 IMPLANT
DRAPE PROXIMA HALF (DRAPES) ×2 IMPLANT
DRAPE U-SHAPE 47X51 STRL (DRAPES) ×2 IMPLANT
DRSG ADAPTIC 3X8 NADH LF (GAUZE/BANDAGES/DRESSINGS) ×2 IMPLANT
DRSG PAD ABDOMINAL 8X10 ST (GAUZE/BANDAGES/DRESSINGS) ×2 IMPLANT
DURAPREP 26ML APPLICATOR (WOUND CARE) ×2 IMPLANT
ELECT REM PT RETURN 9FT ADLT (ELECTROSURGICAL) ×2
ELECTRODE REM PT RTRN 9FT ADLT (ELECTROSURGICAL) ×1 IMPLANT
EVACUATOR 1/8 PVC DRAIN (DRAIN) IMPLANT
FACESHIELD LNG OPTICON STERILE (SAFETY) ×3 IMPLANT
GLOVE BIO SURGEON STRL SZ8.5 (GLOVE) ×2 IMPLANT
GLOVE BIOGEL PI IND STRL 8 (GLOVE) ×1 IMPLANT
GLOVE BIOGEL PI IND STRL 8.5 (GLOVE) ×1 IMPLANT
GLOVE BIOGEL PI INDICATOR 8 (GLOVE) ×1
GLOVE BIOGEL PI INDICATOR 8.5 (GLOVE) ×1
GLOVE SS BIOGEL STRL SZ 8 (GLOVE) ×1 IMPLANT
GLOVE SUPERSENSE BIOGEL SZ 8 (GLOVE) ×1
GOWN PREVENTION PLUS XLARGE (GOWN DISPOSABLE) ×2 IMPLANT
GOWN PREVENTION PLUS XXLARGE (GOWN DISPOSABLE) ×2 IMPLANT
GOWN STRL NON-REIN LRG LVL3 (GOWN DISPOSABLE) ×2 IMPLANT
HANDPIECE INTERPULSE COAX TIP (DISPOSABLE) ×2
HOOD PEEL AWAY FACE SHEILD DIS (HOOD) ×2 IMPLANT
IMMOBILIZER KNEE 20 (SOFTGOODS)
IMMOBILIZER KNEE 20 THIGH 36 (SOFTGOODS) IMPLANT
IMMOBILIZER KNEE 22 UNIV (SOFTGOODS) ×2 IMPLANT
IMMOBILIZER KNEE 24 THIGH 36 (MISCELLANEOUS) IMPLANT
IMMOBILIZER KNEE 24 UNIV (MISCELLANEOUS)
KIT BASIN OR (CUSTOM PROCEDURE TRAY) ×2 IMPLANT
KIT ROOM TURNOVER OR (KITS) ×2 IMPLANT
MANIFOLD NEPTUNE II (INSTRUMENTS) ×2 IMPLANT
NS IRRIG 1000ML POUR BTL (IV SOLUTION) ×2 IMPLANT
PACK TOTAL JOINT (CUSTOM PROCEDURE TRAY) ×2 IMPLANT
PAD ARMBOARD 7.5X6 YLW CONV (MISCELLANEOUS) ×4 IMPLANT
SET HNDPC FAN SPRY TIP SCT (DISPOSABLE) ×1 IMPLANT
SPONGE GAUZE 4X4 12PLY (GAUZE/BANDAGES/DRESSINGS) ×2 IMPLANT
STAPLER VISISTAT 35W (STAPLE) ×2 IMPLANT
SUCTION FRAZIER TIP 10 FR DISP (SUCTIONS) ×1 IMPLANT
SUT VIC AB 0 CT1 27 (SUTURE) ×4
SUT VIC AB 0 CT1 27XBRD ANBCTR (SUTURE) ×2 IMPLANT
SUT VIC AB 2-0 CT1 27 (SUTURE) ×4
SUT VIC AB 2-0 CT1 TAPERPNT 27 (SUTURE) ×2 IMPLANT
SUT VLOC 180 0 24IN GS25 (SUTURE) ×2 IMPLANT
TOWEL OR 17X24 6PK STRL BLUE (TOWEL DISPOSABLE) ×2 IMPLANT
TOWEL OR 17X26 10 PK STRL BLUE (TOWEL DISPOSABLE) ×2 IMPLANT
TRAY FOLEY CATH 14FR (SET/KITS/TRAYS/PACK) ×2 IMPLANT
WATER STERILE IRR 1000ML POUR (IV SOLUTION) ×2 IMPLANT

## 2012-08-29 NOTE — Addendum Note (Signed)
Addendum  created 08/29/12 1547 by Kipp Brood, MD   Modules edited:Anesthesia Blocks and Procedures, Anesthesia Events, Inpatient Notes

## 2012-08-29 NOTE — Interval H&P Note (Signed)
History and Physical Interval Note:  08/29/2012 9:40 AM  [name] [name]  has presented today for surgery, with the diagnosis of DEGENERATIVE JOINT DISEASE RIGHT KNEE  The various methods of treatment have been discussed with the patient and family. After consideration of risks, benefits and other options for treatment, the patient has consented to  Procedure(s) (LRB) with comments: TOTAL KNEE ARTHROPLASTY (Right) as a surgical intervention .  The patient's history has been reviewed, patient examined, no change in status, stable for surgery.  I have reviewed the patient's chart and labs.  Questions were answered to the patient's satisfaction.     Puneet Masoner G

## 2012-08-29 NOTE — Anesthesia Postprocedure Evaluation (Signed)
  Anesthesia Post-op Note  Patient: [name] [name]  Procedure(s) Performed: Procedure(s) (LRB) with comments: TOTAL KNEE ARTHROPLASTY (Right)  Patient Location: PACU  Anesthesia Type: General and GA combined with regional for post-op pain  Level of Consciousness: awake, alert  and oriented  Airway and Oxygen Therapy: Patient Spontanous Breathing and Patient connected to nasal cannula oxygen  Post-op Pain: none  Post-op Assessment: Post-op Vital signs reviewed and Patient's Cardiovascular Status Stable  Post-op Vital Signs: stable  Complications: No apparent anesthesia complications

## 2012-08-29 NOTE — Addendum Note (Signed)
Addendum  created 08/29/12 1547 by Lurene Robley, MD   Modules edited:Anesthesia Blocks and Procedures, Anesthesia Events, Inpatient Notes    

## 2012-08-29 NOTE — Preoperative (Signed)
Beta Blockers   Reason not to administer Beta Blockers:Not Applicable 

## 2012-08-29 NOTE — Transfer of Care (Signed)
Immediate Anesthesia Transfer of Care Note  Patient: [name] [name]  Procedure(s) Performed: Procedure(s) (LRB) with comments: TOTAL KNEE ARTHROPLASTY (Right)  Patient Location: PACU  Anesthesia Type: General  Level of Consciousness: sedated  Airway & Oxygen Therapy: Patient Spontanous Breathing and Patient connected to face mask oxygen  Post-op Assessment: Report given to PACU RN, Post -op Vital signs reviewed and stable and Patient moving all extremities X 4  Post vital signs: Reviewed and stable  Complications: No apparent anesthesia complications

## 2012-08-29 NOTE — Progress Notes (Signed)
Report given to maria rn as caregiver 

## 2012-08-29 NOTE — Op Note (Signed)
PREOP DIAGNOSIS: DJD RIGHT KNEE POSTOP DIAGNOSIS: DJD RIGHT KNEE PROCEDURE: RIGHT TKR ANESTHESIA: General and block ATTENDING SURGEON: Carrine Kroboth G ASSISTANT: Lindwood Qua PA  INDICATIONS FOR PROCEDURE: [name] [name] is a 71 y.o. female who has struggled for a long time with pain due to degenerative arthritis of the right knee.  The patient has failed many conservative non-operative measures and at this point has pain which limits the ability to sleep and walk.  The patient is offered total knee replacement.  Informed operative consent was obtained after discussion of possible risks of anesthesia, infection, neurovascular injury, DVT, and death.  The importance of the post-operative rehabilitation protocol to optimize result was stressed extensively with the patient.  SUMMARY OF FINDINGS AND PROCEDURE:  [name] [name] was taken to the operative suite where under the above anesthesia a right knee replacement was performed.  There were advanced degenerative changes and the bone quality was good.  We used the DePuy system and placed size standard plus femur, 4 tibia, 38 mm all polyethylene patella, and a size 12.5 spacer.  I did include Zinacef antibiotic in the cement.  The patient was admitted for appropriate post-op care to include perioperative antibiotics and mechanical and pharmacologic measures for DVT prophylaxis.  DESCRIPTION OF PROCEDURE:  [name] [name] was taken to the operative suite where the above anesthesia was applied.  The patient was positioned supine and prepped and draped in normal sterile fashion.  An appropriate time out was performed.  After the administration of Kefzol pre-op antibiotic a standard longitudinal incision was made on the anterior knee.  Dissection was carried down to the extensor mechanism.  All appropriate anti-infective measures were used including the pre-operative antibiotic, betadine impregnated drape, and closed hooded exhaust systems for each member of the surgical  team.  A medial parapatellar incision was made in the extensor mechanism and the knee cap flipped and the knee flexed.  Some residual meniscal tissues were removed along with any remaining ACL/PCL tissue.  A guide was placed on the tibia and a flat cut was made on it's superior surface.  An intramedullary guide was placed in the femur and was utilized to make anterior and posterior cuts creating an appropriate flexion gap.  A second intramedullary guide was placed in the femur to make a distal cut properly balancing the knee with an extension gap equal to the flexion gap.  The three bones sized to the above mentioned sizes and the appropriate guides were placed and utilized.  A trial reduction was done and the knee easily came to full extension and the patella tracked well on flexion.  The trial components were removed and all bones were cleaned with pulsatile lavage and then dried thoroughly.  Cement was mixed including antibiotic and was pressurized onto the bones followed by placement of the aforementioned components.  Excess cement was trimmed and pressure was held on the components until the cement had hardened.  The tourniquet was deflated and moderate amount of bleeding was controlled with cautery and pressure. I did place a hemovac drain exiting superolaterally.  The knee was irrigated thoroughly.  The extensor mechanism was re-approximated with V-loc suture in running fashion.  The knee was flexed and the repair was solid.  The subcutaneous tissues were re-approximated with #0 and #2-0 vicryl and the skin closed with a running subcuticular stitch.  A sterile dressing was applied.  Intraoperative fluids, EBL, and tourniquet time can be obtained from anesthesia records.  DISPOSITION:  The patient  was taken to recovery room in stable condition and admitted for appropriate post-op care to include peri-operative antibiotic and DVT prophylaxis with mechanical and pharmacologic measures.  [name] [name]  G 08/29/2012, 12:41 PM

## 2012-08-29 NOTE — Anesthesia Procedure Notes (Addendum)
Procedure Name: LMA Insertion Date/Time: 08/29/2012 10:48 AM Performed by: Sharlene Dory E Pre-anesthesia Checklist: Patient identified, Emergency Drugs available, Suction available, Patient being monitored and Timeout performed Patient Re-evaluated:Patient Re-evaluated prior to inductionOxygen Delivery Method: Circle system utilized Preoxygenation: Pre-oxygenation with 100% oxygen Intubation Type: IV induction Ventilation: Mask ventilation without difficulty LMA: LMA inserted LMA Size: 4.0 Number of attempts: 1 Tube secured with: Tape Dental Injury: Teeth and Oropharynx as per pre-operative assessment     Anesthesia Regional Block:  Femoral nerve block  Pre-Anesthetic Checklist: ,, timeout performed, Correct Patient, Correct Site, Correct Laterality, Correct Procedure, Correct Position, site marked, Risks and benefits discussed,  Surgical consent,  Pre-op evaluation,  At surgeon's request and post-op pain management  Laterality: Left  Prep: chloraprep       Needles:  Injection technique: Single-shot  Needle Type: Echogenic Stimulator Needle     Needle Length:cm 9 cm Needle Gauge: 22 and 22 G    Additional Needles:  Procedures: Doppler guided, ultrasound guided and nerve stimulator Femoral nerve block Narrative:  Start time: 08/29/2012 10:30 AM End time: 08/29/2012 10:40 AM  Performed by: Personally   Additional Notes: 25 cc 0.5% Marcaine with 1:200 Epi.  Kipp Brood, MD  Femoral nerve block

## 2012-08-29 NOTE — Progress Notes (Signed)
Utilization review completed.  

## 2012-08-29 NOTE — Anesthesia Preprocedure Evaluation (Addendum)
Anesthesia Evaluation  Patient identified by MRN, date of birth, ID band Patient awake    Reviewed: Allergy & Precautions, H&P , NPO status , Patient's Chart, lab work & pertinent test results  Airway Mallampati: II TM Distance: >3 FB     Dental  (+) Dental Advisory Given and Teeth Intact   Pulmonary  breath sounds clear to auscultation        Cardiovascular Rhythm:Regular Rate:Normal     Neuro/Psych    GI/Hepatic   Endo/Other    Renal/GU      Musculoskeletal   Abdominal   Peds  Hematology   Anesthesia Other Findings   Reproductive/Obstetrics                          Anesthesia Physical Anesthesia Plan  ASA: III  Anesthesia Plan: General   Post-op Pain Management:    Induction: Intravenous  Airway Management Planned: LMA  Additional Equipment:   Intra-op Plan:   Post-operative Plan: Extubation in OR  Informed Consent: I have reviewed the patients History and Physical, chart, labs and discussed the procedure including the risks, benefits and alternatives for the proposed anesthesia with the patient or authorized representative who has indicated his/her understanding and acceptance.   Dental advisory given  Plan Discussed with: CRNA and Surgeon  Anesthesia Plan Comments: (Plan Ga with FNB  Kipp Brood, MD)        Anesthesia Quick Evaluation

## 2012-08-30 ENCOUNTER — Encounter (HOSPITAL_COMMUNITY): Payer: Self-pay | Admitting: *Deleted

## 2012-08-30 LAB — BASIC METABOLIC PANEL
BUN: 11 mg/dL (ref 6–23)
Creatinine, Ser: 0.73 mg/dL (ref 0.50–1.10)
GFR calc non Af Amer: 85 mL/min — ABNORMAL LOW (ref >90)
Glucose, Bld: 140 mg/dL — ABNORMAL HIGH (ref 70–99)
Potassium: 4.3 mEq/L (ref 3.5–5.1)

## 2012-08-30 LAB — CBC
HCT: 27.1 % — ABNORMAL LOW (ref 36.0–46.0)
Hemoglobin: 9.1 g/dL — ABNORMAL LOW (ref 12.0–15.0)
MCHC: 33.6 g/dL (ref 30.0–36.0)
MCV: 94.4 fL (ref 78.0–100.0)

## 2012-08-30 NOTE — Progress Notes (Signed)
Subjective: 1 Day Post-Op Procedure(s) (LRB): TOTAL KNEE ARTHROPLASTY (Right)  Activity level:  oob today wbat Diet tolerance:  Eating  Voiding:   Foley to come out today Patient reports pain as 2 on 0-10 scale.    Objective: Vital signs in last 24 hours: Temp:  [97 F (36.1 C)-98.5 F (36.9 C)] 98.5 F (36.9 C) (09/25 0612) Pulse Rate:  [66-94] 78  (09/25 0612) Resp:  [8-18] 16  (09/25 0612) BP: (92-146)/(43-78) 104/47 mmHg (09/25 0612) SpO2:  [94 %-99 %] 99 % (09/25 0612)  Labs:  Basename 08/30/12 0458  HGB 9.1*    Basename 08/30/12 0458  WBC 8.9  RBC 2.87*  HCT 27.1*  PLT 176    Basename 08/30/12 0458  NA 138  K 4.3  CL 100  CO2 29  BUN 11  CREATININE 0.73  GLUCOSE 140*  CALCIUM 8.4   No results found for this basename: LABPT:2,INR:2 in the last 72 hours  Physical Exam:  Neurologically intact ABD soft Neurovascular intact Sensation intact distally Intact pulses distally Dorsiflexion/Plantar flexion intact Incision: dressing C/D/I No cellulitis present Compartment soft  Assessment/Plan:  1 Day Post-Op Procedure(s) (LRB): TOTAL KNEE ARTHROPLASTY (Right) Advance diet Up with therapy D/C IV fluids Plan for discharge tomorrow    Lashawnda Hancox R 08/30/2012, 8:02 AM

## 2012-08-30 NOTE — Evaluation (Signed)
Physical Therapy Evaluation Patient Details Name: [name] [name] MRN: [id number] DOB: 08-27-1941 Today's Date: 08/30/2012 Time: 4098-1191 PT Time Calculation (min): 28 min  PT Assessment / Plan / Recommendation Clinical Impression  Pt is a pleasent and cooperative 71 y.o. female s/p right TKA.  Patient presents with deficits in functional mobility secondary to pain, limited ROM, weakness, and decreased activity tolerance.  Pt will benefit from continued skilled PT to address deficits and maximize independence for discharge. Rec Home PT.    PT Assessment  Patient needs continued PT services    Follow Up Recommendations  Home health PT    Barriers to Discharge        Equipment Recommendations  Rolling walker with 5" wheels;3 in 1 bedside comode    Recommendations for Other Services     Frequency 7X/week    Precautions / Restrictions Precautions Precautions: Knee Restrictions Weight Bearing Restrictions: Yes RLE Weight Bearing: Weight bearing as tolerated   Pertinent Vitals/Pain 6 to 7/10      Mobility  Bed Mobility Bed Mobility: Supine to Sit;Sitting - Scoot to Edge of Bed Supine to Sit: 5: Supervision Sitting - Scoot to [place] of Bed: 4: Min assist Details for Bed Mobility Assistance: Min assist to lower RLE but Patient able to assist herself using hands to help slide RLE to EOB Transfers Transfers: Sit to Stand;Stand to Sit Sit to Stand: 4: Min guard;From bed Stand to Sit: 4: Min guard;To chair/3-in-1;With armrests Details for Transfer Assistance: VC's for hand placement Ambulation/Gait Ambulation/Gait Assistance: 4: Min guard Ambulation Distance (Feet): 100 Feet Assistive device: Rolling walker Ambulation/Gait Assistance Details: VCs for sequencing and upright posture initially Gait Pattern: Step-to pattern;Decreased stride length;Decreased stance time - right;Antalgic;Narrow base of support Gait velocity: decreased    Exercises Total Joint Exercises Ankle  Circles/Pumps: AROM;10 reps;Supine;Both Quad Sets: Strengthening;Right;5 reps;Supine Heel Slides: AROM;Right;5 reps;Supine   PT Diagnosis: Difficulty walking;Abnormality of gait;Generalized weakness;Acute pain  PT Problem List: Decreased strength;Decreased range of motion;Decreased activity tolerance;Decreased balance;Decreased mobility;Pain PT Treatment Interventions: DME instruction;Gait training;Stair training;Functional mobility training;Therapeutic activities;Therapeutic exercise;Patient/family education   PT Goals Acute Rehab PT Goals PT Goal Formulation: With patient Time For Goal Achievement: 09/04/12 Potential to Achieve Goals: Good Pt will Stand: with modified independence PT Goal: Stand - Progress: Goal set today Pt will Ambulate: >150 feet;with modified independence;with rolling walker PT Goal: Ambulate - Progress: Goal set today Pt will Go Up / Down Stairs: 3-5 stairs;with min assist;with rolling walker PT Goal: Up/Down Stairs - Progress: Goal set today Pt will Perform Home Exercise Program: Independently PT Goal: Perform Home Exercise Program - Progress: Goal set today  Visit Information  Last PT Received On: 08/30/12 Assistance Needed: +1    Subjective Data  Subjective: I'm ready to get off my tailbone Patient Stated Goal: to go home   Prior Functioning  Home Living Lives With: Spouse Available Help at Discharge: Family Type of Home: House Home Access: Stairs to enter Secretary/administrator of Steps: 2 Entrance Stairs-Rails: Right Home Layout: One level Bathroom Shower/Tub: Forensic scientist: Standard Home Adaptive Equipment: None Prior Function Level of Independence: Independent Able to Take Stairs?: Yes Driving: Yes Vocation: On disability Communication Communication: No difficulties Dominant Hand: Right    Cognition  Overall Cognitive Status: Appears within functional limits for tasks assessed/performed Arousal/Alertness:  Awake/alert Orientation Level: Appears intact for tasks assessed;Oriented X4 / Intact Behavior During Session: [facility] for tasks performed    Extremity/Trunk Assessment Right Upper Extremity Assessment RUE ROM/Strength/Tone: [facility] for tasks  assessed Left Upper Extremity Assessment LUE ROM/Strength/Tone: [facility] for tasks assessed Right Lower Extremity Assessment RLE ROM/Strength/Tone: Deficits;Unable to fully assess;Due to precautions;Due to pain Left Lower Extremity Assessment LLE ROM/Strength/Tone: [facility] for tasks assessed LLE Sensation: Deficits;History of peripheral neuropathy LLE Sensation Deficits: previous ankle sx Trunk Assessment Trunk Assessment: Normal   Balance    End of Session PT - End of Session Equipment Utilized During Treatment: Gait belt;Right knee immobilizer Activity Tolerance: Patient tolerated treatment well;Patient limited by fatigue;Patient limited by pain Patient left: in chair;with call bell/phone within reach;with family/visitor present Nurse Communication: Mobility status  GP     Fabio Asa 08/30/2012, 11:26 AM Charlotte Crumb, PT DPT  [phone]

## 2012-08-30 NOTE — Progress Notes (Signed)
Physical Therapy Treatment Patient Details Name: [name] [name] MRN: [id number] DOB: 06/07/1941 Today's Date: 08/30/2012 Time: 7829-5621 PT Time Calculation (min): 25 min  PT Assessment / Plan / Recommendation Comments on Treatment Session  Pt was able to perform ther-ex with VC's for technique.  Patient had some difficulty with ambulation secondary to increased pain compared to prior session; but was able to tolerate consistant distance.  Will perform stair negotiation training next treatment session in preparation for discharge.    Follow Up Recommendations  Home health PT    Barriers to Discharge        Equipment Recommendations  Rolling walker with 5" wheels;3 in 1 bedside comode    Recommendations for Other Services    Frequency 7X/week   Plan Discharge plan remains appropriate    Precautions / Restrictions Precautions Precautions: Knee Required Braces or Orthoses: Knee Immobilizer - Right Restrictions Weight Bearing Restrictions: Yes RLE Weight Bearing: Weight bearing as tolerated   Pertinent Vitals/Pain 7/10    Mobility  Bed Mobility Bed Mobility: Sit to Supine;Scooting to HOB Sit to Supine: 4: Min assist Scooting to [facility]: 5: Supervision Details for Bed Mobility Assistance: Min assist for RLE Transfers Transfers: Sit to Stand;Stand to Sit Sit to Stand: 4: Min guard;With armrests;From chair/3-in-1 Stand to Sit: 4: Min guard;To bed Ambulation/Gait Ambulation/Gait Assistance: 4: Min guard Ambulation Distance (Feet): 100 Feet Assistive device: Rolling walker Ambulation/Gait Assistance Details: VC's for setting RLE Gait Pattern: Step-to pattern;Decreased stride length;Decreased stance time - right;Antalgic;Narrow base of support Gait velocity: decreased General Gait Details: Patient overall steady with occassional weakness/buckling reported, advised patient to set quad prior to loading weight. Stairs: No    Exercises Total Joint Exercises Ankle Circles/Pumps: AROM;10  reps;Both Quad Sets: Strengthening;Right;10 reps Long Arc Quad: Strengthening;10 reps;Right;Seated Knee Flexion: AAROM;10 reps;Right;Seated (5 sec holds) Goniometric ROM: 7-68     PT Goals Acute Rehab PT Goals Pt will Stand: with modified independence PT Goal: Stand - Progress: Progressing toward goal Pt will Ambulate: >150 feet;with modified independence;with rolling walker PT Goal: Ambulate - Progress: Progressing toward goal Pt will Perform Home Exercise Program: Independently PT Goal: Perform Home Exercise Program - Progress: Progressing toward goal  Visit Information  Last PT Received On: 08/30/12 Assistance Needed: +1    Subjective Data  Subjective: After we are done I would like to get in bed Patient Stated Goal: to go home   Cognition  Overall Cognitive Status: Appears within functional limits for tasks assessed/performed Arousal/Alertness: Awake/alert Orientation Level: Appears intact for tasks assessed;Oriented X4 / Intact Behavior During Session: [facility] for tasks performed    Balance     End of Session PT - End of Session Equipment Utilized During Treatment: Gait belt;Right knee immobilizer Activity Tolerance: Patient limited by fatigue;Patient limited by pain Patient left: in bed;with call bell/phone within reach Nurse Communication: Mobility status   GP     Fabio Asa 08/30/2012, 3:41 PM Charlotte Crumb, PT DPT  [phone]

## 2012-08-31 LAB — CBC
Hemoglobin: 8.9 g/dL — ABNORMAL LOW (ref 12.0–15.0)
MCH: 31.7 pg (ref 26.0–34.0)
MCV: 94.7 fL (ref 78.0–100.0)
RBC: 2.81 MIL/uL — ABNORMAL LOW (ref 3.87–5.11)

## 2012-08-31 LAB — BASIC METABOLIC PANEL
CO2: 30 mEq/L (ref 19–32)
Calcium: 8.6 mg/dL (ref 8.4–10.5)
Creatinine, Ser: 0.85 mg/dL (ref 0.50–1.10)
Glucose, Bld: 148 mg/dL — ABNORMAL HIGH (ref 70–99)

## 2012-08-31 MED ORDER — METHOCARBAMOL 500 MG PO TABS: 500.0000 mg | ORAL_TABLET | Freq: Four times a day (QID) | ORAL | Status: DC | PRN

## 2012-08-31 MED ORDER — OXYCODONE HCL 5 MG PO TABS: 5.0000 mg | ORAL_TABLET | ORAL | Status: DC | PRN

## 2012-08-31 MED ORDER — ASPIRIN 325 MG PO TBEC: 325.0000 mg | DELAYED_RELEASE_TABLET | Freq: Two times a day (BID) | ORAL | Status: DC

## 2012-08-31 NOTE — Evaluation (Signed)
Occupational Therapy Evaluation Patient Details Name: [name] [name] MRN: [id number] DOB: 05-Jun-1941 Today's Date: 08/31/2012 Time: 4098-1191 OT Time Calculation (min): 18 min  OT Assessment / Plan / Recommendation Clinical Impression  70 yo female admitted for Rt TKA that does not require skilled OT acutely. OT  to sign off    OT Assessment  Patient does not need any further OT services    Follow Up Recommendations  No OT follow up    Barriers to Discharge      Equipment Recommendations  Rolling walker with 5" wheels;3 in 1 bedside comode    Recommendations for Other Services    Frequency       Precautions / Restrictions Precautions Precautions: Knee Precaution Comments: hemovac Restrictions Weight Bearing Restrictions: Yes RLE Weight Bearing: Weight bearing as tolerated   Pertinent Vitals/Pain None reported Ice applied to knee    ADL  Eating/Feeding: Performed;Independent Where Assessed - Eating/Feeding: Chair (lunch arrived at end of session) Toilet Transfer: Simulated;Modified independent Toilet Transfer Method: Sit to Barista: Raised toilet seat with arms (or 3-in-1 over toilet) Tub/Shower Transfer: Performed;Supervision/safety Tub/Shower Transfer Method: Science writer: Other (comment) (TUB transfer with 3n1) Equipment Used: Rolling walker Transfers/Ambulation Related to ADLs: Pt ambulated Supervision level with RW ADL Comments: Pt and daughter report concerns with tub transfer. Pt and daughter practiced tub transfer and demonstrates MOD I together. Pt is adequate level for d/c home. All education completed. No further skilled OT needs. Pt able to complete LB dressing and daughter will (A) at initial d/c home.    OT Diagnosis:    OT Problem List:   OT Treatment Interventions:     OT Goals    Visit Information  Last OT Received On: 08/31/12 Assistance Needed: +1    Subjective Data  Subjective: "i am a visual  learner" pt and daughter appreciate visual and practicing all tasks to prepare for d/c Patient Stated Goal: to go home today   Prior Functioning     Home Living Lives With: Spouse Available Help at Discharge: Family Type of Home: House Home Access: Stairs to enter Secretary/administrator of Steps: 2 Entrance Stairs-Rails: Right Home Layout: One level Bathroom Shower/Tub: Forensic scientist: Standard Home Adaptive Equipment: None Prior Function Level of Independence: Independent Able to Take Stairs?: Yes Driving: Yes Vocation: On disability Communication Communication: No difficulties Dominant Hand: Right         Vision/Perception     Cognition  Overall Cognitive Status: Appears within functional limits for tasks assessed/performed Arousal/Alertness: Awake/alert Orientation Level: Appears intact for tasks assessed;Oriented X4 / Intact Behavior During Session: [facility] for tasks performed    Extremity/Trunk Assessment Right Upper Extremity Assessment RUE ROM/Strength/Tone: [facility] for tasks assessed Left Upper Extremity Assessment LUE ROM/Strength/Tone: [facility] for tasks assessed     Mobility Bed Mobility Bed Mobility: Not assessed Details for Bed Mobility Assistance: discussed with pt and daughter. pt reports not difficulty and no questions. Educated on safe transfer. Transfers Sit to Stand: 5: Supervision Stand to Sit: 5: Supervision Details for Transfer Assistance: transfer performed from multiple surfaces x 4 including toiler without difficulty     Shoulder Instructions     Exercise     Balance     End of Session OT - End of Session Activity Tolerance: Patient tolerated treatment well Patient left: in chair;with call bell/phone within reach;with family/visitor present Nurse Communication: Mobility status  GO     Lucile Shutters 08/31/2012, 1:31 PM Pager: [phone]

## 2012-08-31 NOTE — Discharge Summary (Signed)
Patient ID: [name] [name] MRN: [id number] DOB/AGE: Jul 24, 1941 71 y.o.  Admit date: 08/29/2012 Discharge date: 09/01/2012  Admission Diagnoses:  Active Problems:  Right knee DJD   Discharge Diagnoses:  Same  Past Medical History  Diagnosis Date  . Complication of anesthesia     slow to wake up-sats drop  . Hypertension   . Hypothyroidism   . Arthritis   . GERD (gastroesophageal reflux disease)   . Blood transfusion   . Chronic pain   . PONV (postoperative nausea and vomiting)     states she had scop patch in past  . Bronchitis     finishing rx  now  . Ulcer     hx  . H/O hiatal hernia     Surgeries: Procedure(s): TOTAL KNEE ARTHROPLASTY on 08/29/2012   Consultants:    Discharged Condition: Improved  Hospital Course: [name] [name] is an 71 y.o. female who was admitted 08/29/2012 for operative treatment of<principal problem not specified>. Patient has severe unremitting pain that affects sleep, daily activities, and work/hobbies. After pre-op clearance the patient was taken to the operating room on 08/29/2012 and underwent  Procedure(s): TOTAL KNEE ARTHROPLASTY.    Patient was given perioperative antibiotics: Anti-infectives     Start     Dose/Rate Route Frequency Ordered Stop   08/29/12 1915   ceFAZolin (ANCEF) IVPB 2 g/50 mL premix        2 g 100 mL/hr over 30 Minutes Intravenous Every 6 hours 08/29/12 1604 09/11/12 0146   08/29/12 1153   cefUROXime (ZINACEF) injection  Status:  Discontinued          As needed 08/29/12 1153 08/29/12 1311   08/28/12 1504   ceFAZolin (ANCEF) IVPB 2 g/50 mL premix        2 g 100 mL/hr over 30 Minutes Intravenous 60 min pre-op 08/28/12 1504 08/29/12 1055           Patient was given sequential compression devices, early ambulation, and chemoprophylaxis to prevent DVT.  Patient benefited maximally from hospital stay and there were no complications.    Recent vital signs: Patient Vitals for the past 24 hrs:  BP Temp Pulse Resp SpO2    08/31/12 0600 119/56 mmHg 99.4 F (37.4 C) 89  18  90 %  08/31/12 0400 - - - 16  95 %  08/31/12 0000 - - - 16  95 %  11-Sep-2012 2138 109/40 mmHg 100 F (37.8 C) 85  18  95 %  2012-09-11 2000 - - - 18  95 %  09/11/12 1900 - 100.2 F (37.9 C) - - -  2012/09/11 1700 - 102 F (38.9 C) - - -  Sep 11, 2012 1600 130/52 mmHg - 90  18  98 %     Recent laboratory studies:  Basename 08/31/12 0445 Sep 11, 2012 0458  WBC 8.4 8.9  HGB 8.9* 9.1*  HCT 26.6* 27.1*  PLT 172 176  NA 137 138  K 3.9 4.3  CL 96 100  CO2 30 29  BUN 11 11  CREATININE 0.85 0.73  GLUCOSE 148* 140*  INR -- --  CALCIUM 8.6 --     Discharge Medications:     Medication List     As of 08/31/2012  2:53 PM    STOP taking these medications         aspirin 81 MG chewable tablet      celecoxib 200 MG capsule   Commonly known as: CELEBREX      meloxicam 15  MG tablet   Commonly known as: MOBIC      TAKE these medications         aspirin 325 MG EC tablet   Take 1 tablet (325 mg total) by mouth 2 (two) times daily.      atorvastatin 10 MG tablet   Commonly known as: LIPITOR   Take 10 mg by mouth every evening. Patient on hold for 1 month due to muscle cramps and spasms.      calcium carbonate 1250 MG tablet   Commonly known as: OS-CAL - dosed in mg of elemental calcium   Take 1 tablet by mouth daily.      EPINEPHrine 0.3 mg/0.3 mL Devi   Commonly known as: EPI-PEN   Inject 0.3 mg into the muscle once. For various allergic reactions.      FLAX SEED OIL PO   Take 1 capsule by mouth 2 (two) times daily.      furosemide 40 MG tablet   Commonly known as: LASIX   Take 40 mg by mouth daily.      gabapentin 600 MG tablet   Commonly known as: NEURONTIN   Take 600 mg by mouth 3 (three) times daily.      levothyroxine 75 MCG tablet   Commonly known as: SYNTHROID, LEVOTHROID   Take 75 mcg by mouth daily.      loratadine 10 MG tablet   Commonly known as: CLARITIN   Take 10 mg by mouth daily.      methadone 5 MG  tablet   Commonly known as: DOLOPHINE   Take 5 mg by mouth QID.      methocarbamol 500 MG tablet   Commonly known as: ROBAXIN   Take 1 tablet (500 mg total) by mouth every 6 (six) hours as needed. Muscle spasm      metoprolol succinate 25 MG 24 hr tablet   Commonly known as: TOPROL-XL   Take 25 mg by mouth daily.      multivitamin with minerals Tabs   Take 1 tablet by mouth daily.      omeprazole 20 MG capsule   Commonly known as: PRILOSEC   Take 20 mg by mouth daily.      oxyCODONE 5 MG immediate release tablet   Commonly known as: Oxy IR/ROXICODONE   Take 1-2 tablets (5-10 mg total) by mouth every 3 (three) hours as needed.      REFRESH DRY EYE THERAPY OP   Place 1 drop into both eyes every 6 (six) hours as needed. Dry eyes .      senna 8.6 MG tablet   Commonly known as: SENOKOT   Take 1 tablet by mouth 2 (two) times daily.      vitamin C with rose hips 1000 MG tablet   Take 1,000 mg by mouth daily.      zolpidem 12.5 MG CR tablet   Commonly known as: AMBIEN CR   Take 12.5 mg by mouth at bedtime.        Diagnostic Studies: Dg Chest 2 View  08/18/2012  *RADIOLOGY REPORT*  Clinical Data: Preoperative evaluation for total right knee arthroplasty.  History of recent bronchitis  CHEST - 2 VIEW  Comparison: 07/27/2009  Findings: Low lung volumes are present and taking this into consideration heart size is within normal limits.  Mild ectasia of the thoracic aorta is seen and is stable in comparison with the prior exam.  There is linear scarring identified in the lingula which is  unchanged in comparison with prior exam.  The lung fields demonstrate some prominence of the interstitial markings which are stable.  No new focal infiltrates or signs of congestive failure seen.  Stable mild central peribronchial cuffing is identified.  Bony structures demonstrate degenerative osteophytosis of the mid and lower thoracic spine and are otherwise intact.  IMPRESSION: Low lung volumes with  stable scarring in the lingula and underlying chronic bronchitic change.  No worrisome focal or acute abnormality suggested   Original Report Authenticated By: Bertha Stakes, M.D.     Disposition: 01-Home or Self Care      Discharge Orders    Future Orders Please Complete By Expires   Diet - low sodium heart healthy      Call MD / Call 911      Comments:   If you experience chest pain or shortness of breath, CALL 911 and be transported to the hospital emergency room.  If you develope a fever above 101 F, pus (white drainage) or increased drainage or redness at the wound, or calf pain, call your surgeon's office.   Constipation Prevention      Comments:   Drink plenty of fluids.  Prune juice may be helpful.  You may use a stool softener, such as Colace (over the counter) 100 mg twice a day.  Use MiraLax (over the counter) for constipation as needed.   Increase activity slowly as tolerated         Follow-up Information    Follow up with Velna Ochs, MD. Call in 12 days.   Contact information:   [address] [place] [address] [phone]           Signed: Prince Rome 08/31/2012, 2:53 PM

## 2012-08-31 NOTE — Progress Notes (Signed)
CARE MANAGEMENT NOTE 08/31/2012  Patient:  [name] [name], [name] [name]   Account Number:  [id number]  Date Initiated:  08/31/2012  Documentation initiated by:  Vance Peper  Subjective/Objective Assessment:   71 yr old female s/p right total knee arthroplasty     Action/Plan:   Cm spoke with patient regarding home health needs and DME. Patient preoperatively setup with [facility], no changes.DME to be delivered by [employer].   Anticipated DC Date:  08/31/2012   Anticipated DC Plan:  HOME W HOME HEALTH SERVICES      DC Planning Services  CM consult      PAC Choice  DURABLE MEDICAL EQUIPMENT  HOME HEALTH   Choice offered to / List presented to:  C-1 Patient        HH arranged  HH-2 PT      HH agency  [facility]   Status of service:  Completed, signed off Medicare Important Message given?   (If response is "NO", the following Medicare IM given date fields will be blank) Date Medicare IM given:   Date Additional Medicare IM given:    Discharge Disposition:  HOME W HOME HEALTH SERVICES  Per UR Regulation:    If discussed at Long Length of Stay Meetings, dates discussed:    Comments:

## 2012-08-31 NOTE — Progress Notes (Signed)
Subjective: 2 Days Post-Op Procedure(s) (LRB): TOTAL KNEE ARTHROPLASTY (Right)  Activity level:  oob with pt Diet tolerance:  Regular but nauseated Voiding:  Well in BR Patient reports pain as moderate.    Objective: Vital signs in last 24 hours: Temp:  [99.4 F (37.4 C)-102 F (38.9 C)] 99.4 F (37.4 C) (09/26 0600) Pulse Rate:  [85-90] 89  (09/26 0600) Resp:  [16-18] 18  (09/26 0600) BP: (109-130)/(40-56) 119/56 mmHg (09/26 0600) SpO2:  [90 %-98 %] 90 % (09/26 0600)  Labs:  Basename 08/31/12 0445 08/30/12 0458  HGB 8.9* 9.1*    Basename 08/31/12 0445 08/30/12 0458  WBC 8.4 8.9  RBC 2.81* 2.87*  HCT 26.6* 27.1*  PLT 172 176    Basename 08/31/12 0445 08/30/12 0458  NA 137 138  K 3.9 4.3  CL 96 100  CO2 30 29  BUN 11 11  CREATININE 0.85 0.73  GLUCOSE 148* 140*  CALCIUM 8.6 8.4   No results found for this basename: LABPT:2,INR:2 in the last 72 hours  Physical Exam:  Neurologically intact ABD soft Sensation intact distally Intact pulses distally Dorsiflexion/Plantar flexion intact Incision: no drainage Compartment soft  Assessment/Plan:  2 Days Post-Op Procedure(s) (LRB): TOTAL KNEE ARTHROPLASTY (Right) Advance diet Up with therapy D/C IV fluids Plan for discharge tomorrow    Tajah Noguchi G 08/31/2012, 2:25 PM

## 2012-08-31 NOTE — Progress Notes (Signed)
Physical Therapy Treatment Patient Details Name: [name] [name] MRN: [id number] DOB: July 05, 1941 Today's Date: 08/31/2012 Time: 8119-1478 PT Time Calculation (min): 28 min  PT Assessment / Plan / Recommendation Comments on Treatment Session  Patient educated on HEP. with family present.  Patient able to perform all ther-ex with proper technique.  Patient continues to demonstrate steady progress towards PT goals. Feel patient will be safe for dc home with home PT and family.    Follow Up Recommendations  Home health PT    Barriers to Discharge        Equipment Recommendations  Rolling walker with 5" wheels;3 in 1 bedside comode    Recommendations for Other Services    Frequency 7X/week   Plan Discharge plan remains appropriate    Precautions / Restrictions Precautions Precautions: Knee Precaution Comments: hemovac Restrictions Weight Bearing Restrictions: Yes RLE Weight Bearing: Weight bearing as tolerated   Pertinent Vitals/Pain 5/10    Mobility  Bed Mobility Bed Mobility: Not assessed Details for Bed Mobility Assistance: discussed with pt and daughter. pt reports not difficulty and no questions. Educated on safe transfer. Transfers Transfers: Sit to Stand;Stand to Sit Sit to Stand: 5: Supervision Stand to Sit: 5: Supervision Details for Transfer Assistance: performed x2 without need for VCs Ambulation/Gait Ambulation/Gait Assistance: 5: Supervision Ambulation Distance (Feet): 200 Feet Assistive device: Rolling walker Ambulation/Gait Assistance Details: VC's to look up, emerging into strp thru gait without difficulty Gait Pattern: Decreased stride length;Decreased stance time - right;Antalgic;Narrow base of support;Step-through pattern Gait velocity: decreased General Gait Details: Steady with ambulation Stairs: Yes Stairs Assistance: 4: Min assist Stairs Assistance Details (indicate cue type and reason): Min Assist to stabilize RW Stair Management Technique:  Backwards;With walker Number of Stairs: 3  (performed x2 successfully; also performed fwd curb negotatio)    Exercises Total Joint Exercises Ankle Circles/Pumps: AROM;10 reps;Both Quad Sets: Strengthening;Right;10 reps Towel Squeeze: Strengthening;Right;10 reps Short Arc Quad: Strengthening;Right;10 reps Heel Slides: AROM;Right;5 reps;Supine Hip ABduction/ADduction: Strengthening;Right;10 reps Straight Leg Raises: Strengthening;Right;10 reps Long Arc Quad: Strengthening;Right;10 reps Knee Flexion: AAROM;10 reps;Right;Seated Marching in Standing: Strengthening;Right;10 reps General Exercises - Lower Extremity Mini-Sqauts: Strengthening;Right;10 reps     PT Goals Acute Rehab PT Goals Pt will Stand: with modified independence PT Goal: Stand - Progress: Progressing toward goal Pt will Ambulate: >150 feet;with modified independence;with rolling walker PT Goal: Ambulate - Progress: Progressing toward goal Pt will Go Up / Down Stairs: 3-5 stairs;with min assist;with rolling walker PT Goal: Up/Down Stairs - Progress: Met Pt will Perform Home Exercise Program: Independently PT Goal: Perform Home Exercise Program - Progress: Progressing toward goal  Visit Information  Last PT Received On: 08/31/12 Assistance Needed: +1    Subjective Data  Subjective: my tail bone is a little sore f Patient Stated Goal: to go home   Cognition  Overall Cognitive Status: Appears within functional limits for tasks assessed/performed Arousal/Alertness: Awake/alert Orientation Level: Appears intact for tasks assessed;Oriented X4 / Intact Behavior During Session: [facility] for tasks performed    Balance     End of Session PT - End of Session Equipment Utilized During Treatment: Gait belt Activity Tolerance: Patient tolerated treatment well Patient left: in chair;with call bell/phone within reach;with family/visitor present Nurse Communication: Mobility status   GP     Fabio Asa 08/31/2012, 3:22  PM Charlotte Crumb, PT DPT  [phone]

## 2012-08-31 NOTE — Progress Notes (Signed)
Physical Therapy Treatment Patient Details Name: [name] [name] MRN: [id number] DOB: 1941/05/03 Today's Date: 08/31/2012 Time: 4098-1191 PT Time Calculation (min): 32 min  PT Assessment / Plan / Recommendation Comments on Treatment Session  Patient tolerated treatment well and was able to successfully perform stair and curb negotiation without difficulty.  patient was also able to demonstrate safe ambulation over increased distance as well as perform transfers from chair and standard height toilet.  Patient and family were educated on proper technique for car transfer.  Feel patient will be safe for discharge home with family and home PT to follow.    Follow Up Recommendations  Home health PT    Barriers to Discharge        Equipment Recommendations  Rolling walker with 5" wheels;3 in 1 bedside comode    Recommendations for Other Services    Frequency 7X/week   Plan Discharge plan remains appropriate    Precautions / Restrictions Precautions Precautions: Knee Restrictions Weight Bearing Restrictions: Yes RLE Weight Bearing: Weight bearing as tolerated   Pertinent Vitals/Pain 6/10    Mobility  Bed Mobility Bed Mobility: Not assessed Transfers Transfers: Sit to Stand;Stand to Sit Sit to Stand: 5: Supervision Stand to Sit: 5: Supervision Details for Transfer Assistance: transfer performed from multiple surfaces x 4 including toiler without difficulty Ambulation/Gait Ambulation/Gait Assistance: 5: Supervision Ambulation Distance (Feet): 200 Feet Assistive device: Rolling walker Ambulation/Gait Assistance Details: VC's to look up, emerging into strp thru gait without difficulty Gait Pattern: Decreased stride length;Decreased stance time - right;Antalgic;Narrow base of support;Step-through pattern Gait velocity: decreased General Gait Details: Steady with ambulation Stairs: Yes Stairs Assistance: 4: Min assist Stairs Assistance Details (indicate cue type and reason): Min Assist  to stabilize RW Stair Management Technique: Backwards;With walker Number of Stairs: 3  (performed x2 successfully; also performed fwd curb negotatio)      PT Goals Acute Rehab PT Goals Pt will Stand: with modified independence PT Goal: Stand - Progress: Progressing toward goal Pt will Ambulate: >150 feet;with modified independence;with rolling walker PT Goal: Ambulate - Progress: Progressing toward goal Pt will Go Up / Down Stairs: 3-5 stairs;with min assist;with rolling walker PT Goal: Up/Down Stairs - Progress: Met Pt will Perform Home Exercise Program: Independently PT Goal: Perform Home Exercise Program - Progress: Progressing toward goal  Visit Information  Last PT Received On: 08/31/12 Assistance Needed: +1    Subjective Data  Subjective: I've been up to go to the bathroom Patient Stated Goal: to go home   Cognition  Overall Cognitive Status: Appears within functional limits for tasks assessed/performed Arousal/Alertness: Awake/alert Orientation Level: Appears intact for tasks assessed;Oriented X4 / Intact Behavior During Session: [facility] for tasks performed    Balance     End of Session PT - End of Session Equipment Utilized During Treatment: Gait belt Activity Tolerance: Patient tolerated treatment well Patient left: in chair;with call bell/phone within reach;with family/visitor present Nurse Communication: Mobility status   GP     Fabio Asa 08/31/2012, 1:21 PM Charlotte Crumb, PT DPT  [phone]

## 2012-09-01 LAB — CBC
MCH: 31.6 pg (ref 26.0–34.0)
MCV: 93.9 fL (ref 78.0–100.0)
Platelets: 158 10*3/uL (ref 150–400)
RDW: 13.1 % (ref 11.5–15.5)

## 2012-09-01 NOTE — Progress Notes (Signed)
Physical Therapy Treatment Patient Details Name: [name] [name] MRN: [id number] DOB: 09/16/1941 Today's Date: 09/01/2012 Time: 4098-1191 PT Time Calculation (min): 24 min  PT Assessment / Plan / Recommendation Comments on Treatment Session  Patient able to perform abulation outside of unit without difficulty.  Patient steady with ambulation and transfers and reports minimal pain this am.  Patient is safe for discharge.    Follow Up Recommendations  Home health PT    Barriers to Discharge        Equipment Recommendations  Rolling walker with 5" wheels;3 in 1 bedside comode    Recommendations for Other Services    Frequency 7X/week   Plan Discharge plan remains appropriate    Precautions / Restrictions Precautions Precautions: Knee Required Braces or Orthoses: Knee Immobilizer - Right Restrictions RLE Weight Bearing: Weight bearing as tolerated   Pertinent Vitals/Pain 2/10    Mobility  Bed Mobility Bed Mobility: Not assessed Transfers Transfers: Sit to Stand;Stand to Sit Sit to Stand: 6: Modified independent (Device/Increase time) Stand to Sit: 6: Modified independent (Device/Increase time) Ambulation/Gait Ambulation/Gait Assistance: 5: Supervision;6: Modified independent (Device/Increase time) Ambulation Distance (Feet): 500 Feet Assistive device: Rolling walker Ambulation/Gait Assistance Details: VC's for increased velocity and step thru gait (Patient progresson to Mod Independence upon return to unit. ) Gait Pattern: Step-through pattern Gait velocity: decreased initially but progressed to normal velocity General Gait Details: steady with ambulation Stairs: No    Exercises Total Joint Exercises Ankle Circles/Pumps: AROM;10 reps;Both Quad Sets: Strengthening;Right;10 reps General Exercises - Lower Extremity Mini-Sqauts: Strengthening;Right;10 reps     PT Goals Acute Rehab PT Goals PT Goal Formulation: With patient Time For Goal Achievement: 09/04/12 Potential to  Achieve Goals: Good Pt will Stand: with modified independence PT Goal: Stand - Progress: Met Pt will Ambulate: >150 feet;with modified independence;with rolling walker PT Goal: Ambulate - Progress: Met Pt will Perform Home Exercise Program: Independently PT Goal: Perform Home Exercise Program - Progress: Progressing toward goal  Visit Information  Last PT Received On: 09/01/12 Assistance Needed: +1    Subjective Data  Subjective: I am feeling so much better today Patient Stated Goal: to go home   Cognition  Overall Cognitive Status: Appears within functional limits for tasks assessed/performed Arousal/Alertness: Awake/alert Orientation Level: Appears intact for tasks assessed;Oriented X4 / Intact Behavior During Session: [facility] for tasks performed    Balance     End of Session PT - End of Session Equipment Utilized During Treatment: Gait belt Activity Tolerance: Patient tolerated treatment well Patient left: in chair;with call bell/phone within reach;with family/visitor present Nurse Communication: Mobility status   GP     Fabio Asa 09/01/2012, 11:28 AM Charlotte Crumb, PT DPT  [phone]

## 2012-09-01 NOTE — Progress Notes (Signed)
Subjective: 3 Days Post-Op Procedure(s) (LRB): TOTAL KNEE ARTHROPLASTY (Right)  Activity level:  oob with pt Diet tolerance:  eating Voiding:  ok Patient reports pain as 1 on 0-10 scale.    Objective: Vital signs in last 24 hours: Temp:  [97.8 F (36.6 C)-100.8 F (38.2 C)] 97.8 F (36.6 C) (09/27 0536) Pulse Rate:  [69-88] 69  (09/27 0536) Resp:  [15-18] 18  (09/27 0536) BP: (117-124)/(39-54) 117/51 mmHg (09/27 0536) SpO2:  [90 %-98 %] 90 % (09/27 0536)  Labs:  Basename 08/31/12 0445 08/30/12 0458  HGB 8.9* 9.1*    Basename 08/31/12 0445 08/30/12 0458  WBC 8.4 8.9  RBC 2.81* 2.87*  HCT 26.6* 27.1*  PLT 172 176    Basename 08/31/12 0445 08/30/12 0458  NA 137 138  K 3.9 4.3  CL 96 100  CO2 30 29  BUN 11 11  CREATININE 0.85 0.73  GLUCOSE 148* 140*  CALCIUM 8.6 8.4   No results found for this basename: LABPT:2,INR:2 in the last 72 hours  Physical Exam:  Neurologically intact ABD soft Neurovascular intact Sensation intact distally Intact pulses distally Dorsiflexion/Plantar flexion intact Incision: dressing C/D/I and no drainage No cellulitis present Compartment soft  Assessment/Plan:  3 Days Post-Op Procedure(s) (LRB): TOTAL KNEE ARTHROPLASTY (Right) Up with therapy Discharge home with home health ASA 325 BID x 14 days Home health therapy    Silverio Hagan R 09/01/2012, 8:02 AM

## 2012-09-01 NOTE — Progress Notes (Signed)
Discharge instructions given to patient and family. Discharged home with family.

## 2012-09-02 DIAGNOSIS — Z471 Aftercare following joint replacement surgery: Secondary | ICD-10-CM | POA: Diagnosis not present

## 2012-09-02 DIAGNOSIS — I10 Essential (primary) hypertension: Secondary | ICD-10-CM | POA: Diagnosis not present

## 2012-09-02 DIAGNOSIS — Z96659 Presence of unspecified artificial knee joint: Secondary | ICD-10-CM | POA: Diagnosis not present

## 2012-09-02 DIAGNOSIS — R269 Unspecified abnormalities of gait and mobility: Secondary | ICD-10-CM | POA: Diagnosis not present

## 2012-09-03 DIAGNOSIS — Z471 Aftercare following joint replacement surgery: Secondary | ICD-10-CM | POA: Diagnosis not present

## 2012-09-03 DIAGNOSIS — I10 Essential (primary) hypertension: Secondary | ICD-10-CM | POA: Diagnosis not present

## 2012-09-03 DIAGNOSIS — Z96659 Presence of unspecified artificial knee joint: Secondary | ICD-10-CM | POA: Diagnosis not present

## 2012-09-03 DIAGNOSIS — R269 Unspecified abnormalities of gait and mobility: Secondary | ICD-10-CM | POA: Diagnosis not present

## 2012-09-04 DIAGNOSIS — R269 Unspecified abnormalities of gait and mobility: Secondary | ICD-10-CM | POA: Diagnosis not present

## 2012-09-04 DIAGNOSIS — I10 Essential (primary) hypertension: Secondary | ICD-10-CM | POA: Diagnosis not present

## 2012-09-04 DIAGNOSIS — Z471 Aftercare following joint replacement surgery: Secondary | ICD-10-CM | POA: Diagnosis not present

## 2012-09-04 DIAGNOSIS — Z96659 Presence of unspecified artificial knee joint: Secondary | ICD-10-CM | POA: Diagnosis not present

## 2012-09-06 DIAGNOSIS — I10 Essential (primary) hypertension: Secondary | ICD-10-CM | POA: Diagnosis not present

## 2012-09-06 DIAGNOSIS — R269 Unspecified abnormalities of gait and mobility: Secondary | ICD-10-CM | POA: Diagnosis not present

## 2012-09-06 DIAGNOSIS — Z96659 Presence of unspecified artificial knee joint: Secondary | ICD-10-CM | POA: Diagnosis not present

## 2012-09-06 DIAGNOSIS — Z471 Aftercare following joint replacement surgery: Secondary | ICD-10-CM | POA: Diagnosis not present

## 2012-09-07 DIAGNOSIS — L821 Other seborrheic keratosis: Secondary | ICD-10-CM | POA: Diagnosis not present

## 2012-09-08 DIAGNOSIS — Z96659 Presence of unspecified artificial knee joint: Secondary | ICD-10-CM | POA: Diagnosis not present

## 2012-09-08 DIAGNOSIS — I10 Essential (primary) hypertension: Secondary | ICD-10-CM | POA: Diagnosis not present

## 2012-09-08 DIAGNOSIS — Z471 Aftercare following joint replacement surgery: Secondary | ICD-10-CM | POA: Diagnosis not present

## 2012-09-08 DIAGNOSIS — R269 Unspecified abnormalities of gait and mobility: Secondary | ICD-10-CM | POA: Diagnosis not present

## 2012-09-11 DIAGNOSIS — Z96659 Presence of unspecified artificial knee joint: Secondary | ICD-10-CM | POA: Diagnosis not present

## 2012-09-11 DIAGNOSIS — I10 Essential (primary) hypertension: Secondary | ICD-10-CM | POA: Diagnosis not present

## 2012-09-11 DIAGNOSIS — M1710 Unilateral primary osteoarthritis, unspecified knee: Secondary | ICD-10-CM | POA: Diagnosis not present

## 2012-09-11 DIAGNOSIS — R269 Unspecified abnormalities of gait and mobility: Secondary | ICD-10-CM | POA: Diagnosis not present

## 2012-09-11 DIAGNOSIS — Z471 Aftercare following joint replacement surgery: Secondary | ICD-10-CM | POA: Diagnosis not present

## 2012-09-12 DIAGNOSIS — R269 Unspecified abnormalities of gait and mobility: Secondary | ICD-10-CM | POA: Diagnosis not present

## 2012-09-12 DIAGNOSIS — Z96659 Presence of unspecified artificial knee joint: Secondary | ICD-10-CM | POA: Diagnosis not present

## 2012-09-12 DIAGNOSIS — I10 Essential (primary) hypertension: Secondary | ICD-10-CM | POA: Diagnosis not present

## 2012-09-12 DIAGNOSIS — Z471 Aftercare following joint replacement surgery: Secondary | ICD-10-CM | POA: Diagnosis not present

## 2012-09-13 DIAGNOSIS — Z96659 Presence of unspecified artificial knee joint: Secondary | ICD-10-CM | POA: Diagnosis not present

## 2012-09-13 DIAGNOSIS — I10 Essential (primary) hypertension: Secondary | ICD-10-CM | POA: Diagnosis not present

## 2012-09-13 DIAGNOSIS — R269 Unspecified abnormalities of gait and mobility: Secondary | ICD-10-CM | POA: Diagnosis not present

## 2012-09-13 DIAGNOSIS — Z471 Aftercare following joint replacement surgery: Secondary | ICD-10-CM | POA: Diagnosis not present

## 2012-09-15 DIAGNOSIS — R269 Unspecified abnormalities of gait and mobility: Secondary | ICD-10-CM | POA: Diagnosis not present

## 2012-09-15 DIAGNOSIS — Z471 Aftercare following joint replacement surgery: Secondary | ICD-10-CM | POA: Diagnosis not present

## 2012-09-15 DIAGNOSIS — Z96659 Presence of unspecified artificial knee joint: Secondary | ICD-10-CM | POA: Diagnosis not present

## 2012-09-15 DIAGNOSIS — G8918 Other acute postprocedural pain: Secondary | ICD-10-CM | POA: Diagnosis not present

## 2012-09-15 DIAGNOSIS — I10 Essential (primary) hypertension: Secondary | ICD-10-CM | POA: Diagnosis not present

## 2012-09-19 DIAGNOSIS — R269 Unspecified abnormalities of gait and mobility: Secondary | ICD-10-CM | POA: Diagnosis not present

## 2012-09-19 DIAGNOSIS — Z96659 Presence of unspecified artificial knee joint: Secondary | ICD-10-CM | POA: Diagnosis not present

## 2012-09-20 DIAGNOSIS — Z96659 Presence of unspecified artificial knee joint: Secondary | ICD-10-CM | POA: Diagnosis not present

## 2012-09-20 DIAGNOSIS — R269 Unspecified abnormalities of gait and mobility: Secondary | ICD-10-CM | POA: Diagnosis not present

## 2012-09-26 DIAGNOSIS — R269 Unspecified abnormalities of gait and mobility: Secondary | ICD-10-CM | POA: Diagnosis not present

## 2012-09-26 DIAGNOSIS — Z96659 Presence of unspecified artificial knee joint: Secondary | ICD-10-CM | POA: Diagnosis not present

## 2012-09-28 DIAGNOSIS — R269 Unspecified abnormalities of gait and mobility: Secondary | ICD-10-CM | POA: Diagnosis not present

## 2012-10-03 DIAGNOSIS — Z96659 Presence of unspecified artificial knee joint: Secondary | ICD-10-CM | POA: Diagnosis not present

## 2012-10-03 DIAGNOSIS — R269 Unspecified abnormalities of gait and mobility: Secondary | ICD-10-CM | POA: Diagnosis not present

## 2012-10-04 DIAGNOSIS — H35059 Retinal neovascularization, unspecified, unspecified eye: Secondary | ICD-10-CM | POA: Diagnosis not present

## 2012-10-04 DIAGNOSIS — H43819 Vitreous degeneration, unspecified eye: Secondary | ICD-10-CM | POA: Diagnosis not present

## 2012-10-04 DIAGNOSIS — H353290 Exudative age-related macular degeneration, unspecified eye, stage unspecified: Secondary | ICD-10-CM | POA: Diagnosis not present

## 2012-10-04 DIAGNOSIS — H353190 Nonexudative age-related macular degeneration, unspecified eye, stage unspecified: Secondary | ICD-10-CM | POA: Diagnosis not present

## 2012-10-06 DIAGNOSIS — R269 Unspecified abnormalities of gait and mobility: Secondary | ICD-10-CM | POA: Diagnosis not present

## 2012-10-06 DIAGNOSIS — Z96659 Presence of unspecified artificial knee joint: Secondary | ICD-10-CM | POA: Diagnosis not present

## 2012-10-10 DIAGNOSIS — R269 Unspecified abnormalities of gait and mobility: Secondary | ICD-10-CM | POA: Diagnosis not present

## 2012-10-10 DIAGNOSIS — Z96659 Presence of unspecified artificial knee joint: Secondary | ICD-10-CM | POA: Diagnosis not present

## 2012-10-12 DIAGNOSIS — Z96659 Presence of unspecified artificial knee joint: Secondary | ICD-10-CM | POA: Diagnosis not present

## 2012-10-12 DIAGNOSIS — R269 Unspecified abnormalities of gait and mobility: Secondary | ICD-10-CM | POA: Diagnosis not present

## 2012-10-17 DIAGNOSIS — R269 Unspecified abnormalities of gait and mobility: Secondary | ICD-10-CM | POA: Diagnosis not present

## 2012-10-17 DIAGNOSIS — Z96659 Presence of unspecified artificial knee joint: Secondary | ICD-10-CM | POA: Diagnosis not present

## 2012-10-18 DIAGNOSIS — M25569 Pain in unspecified knee: Secondary | ICD-10-CM | POA: Diagnosis not present

## 2012-10-18 DIAGNOSIS — M961 Postlaminectomy syndrome, not elsewhere classified: Secondary | ICD-10-CM | POA: Diagnosis not present

## 2012-10-18 DIAGNOSIS — M79609 Pain in unspecified limb: Secondary | ICD-10-CM | POA: Diagnosis not present

## 2012-10-18 DIAGNOSIS — IMO0002 Reserved for concepts with insufficient information to code with codable children: Secondary | ICD-10-CM | POA: Diagnosis not present

## 2012-10-19 DIAGNOSIS — Z96659 Presence of unspecified artificial knee joint: Secondary | ICD-10-CM | POA: Diagnosis not present

## 2012-10-19 DIAGNOSIS — R269 Unspecified abnormalities of gait and mobility: Secondary | ICD-10-CM | POA: Diagnosis not present

## 2012-10-30 DIAGNOSIS — M7750 Other enthesopathy of unspecified foot: Secondary | ICD-10-CM | POA: Diagnosis not present

## 2012-10-31 DIAGNOSIS — R269 Unspecified abnormalities of gait and mobility: Secondary | ICD-10-CM | POA: Diagnosis not present

## 2012-10-31 DIAGNOSIS — Z96659 Presence of unspecified artificial knee joint: Secondary | ICD-10-CM | POA: Diagnosis not present

## 2012-11-07 DIAGNOSIS — R82998 Other abnormal findings in urine: Secondary | ICD-10-CM | POA: Diagnosis not present

## 2012-11-07 DIAGNOSIS — E785 Hyperlipidemia, unspecified: Secondary | ICD-10-CM | POA: Diagnosis not present

## 2012-11-07 DIAGNOSIS — I10 Essential (primary) hypertension: Secondary | ICD-10-CM | POA: Diagnosis not present

## 2012-11-07 DIAGNOSIS — E039 Hypothyroidism, unspecified: Secondary | ICD-10-CM | POA: Diagnosis not present

## 2012-11-08 DIAGNOSIS — Z96659 Presence of unspecified artificial knee joint: Secondary | ICD-10-CM | POA: Diagnosis not present

## 2012-11-08 DIAGNOSIS — R269 Unspecified abnormalities of gait and mobility: Secondary | ICD-10-CM | POA: Diagnosis not present

## 2012-11-10 DIAGNOSIS — Z96659 Presence of unspecified artificial knee joint: Secondary | ICD-10-CM | POA: Diagnosis not present

## 2012-11-10 DIAGNOSIS — R21 Rash and other nonspecific skin eruption: Secondary | ICD-10-CM | POA: Diagnosis not present

## 2012-11-10 DIAGNOSIS — I10 Essential (primary) hypertension: Secondary | ICD-10-CM | POA: Diagnosis not present

## 2012-11-10 DIAGNOSIS — R269 Unspecified abnormalities of gait and mobility: Secondary | ICD-10-CM | POA: Diagnosis not present

## 2012-11-10 DIAGNOSIS — Z0000 Encounter for general adult medical examination without abnormal findings: Secondary | ICD-10-CM | POA: Diagnosis not present

## 2012-11-10 DIAGNOSIS — Z1331 Encounter for screening for depression: Secondary | ICD-10-CM | POA: Diagnosis not present

## 2012-11-13 DIAGNOSIS — Z1212 Encounter for screening for malignant neoplasm of rectum: Secondary | ICD-10-CM | POA: Diagnosis not present

## 2012-11-15 DIAGNOSIS — Z96659 Presence of unspecified artificial knee joint: Secondary | ICD-10-CM | POA: Diagnosis not present

## 2012-11-15 DIAGNOSIS — R269 Unspecified abnormalities of gait and mobility: Secondary | ICD-10-CM | POA: Diagnosis not present

## 2012-11-17 DIAGNOSIS — Z96659 Presence of unspecified artificial knee joint: Secondary | ICD-10-CM | POA: Diagnosis not present

## 2012-11-22 DIAGNOSIS — IMO0002 Reserved for concepts with insufficient information to code with codable children: Secondary | ICD-10-CM | POA: Diagnosis not present

## 2012-11-22 DIAGNOSIS — M961 Postlaminectomy syndrome, not elsewhere classified: Secondary | ICD-10-CM | POA: Diagnosis not present

## 2012-11-22 DIAGNOSIS — G8928 Other chronic postprocedural pain: Secondary | ICD-10-CM | POA: Diagnosis not present

## 2012-11-22 DIAGNOSIS — M25569 Pain in unspecified knee: Secondary | ICD-10-CM | POA: Diagnosis not present

## 2012-12-21 DIAGNOSIS — M79609 Pain in unspecified limb: Secondary | ICD-10-CM | POA: Diagnosis not present

## 2012-12-21 DIAGNOSIS — M961 Postlaminectomy syndrome, not elsewhere classified: Secondary | ICD-10-CM | POA: Diagnosis not present

## 2012-12-21 DIAGNOSIS — M25569 Pain in unspecified knee: Secondary | ICD-10-CM | POA: Diagnosis not present

## 2013-01-08 DIAGNOSIS — Z96659 Presence of unspecified artificial knee joint: Secondary | ICD-10-CM | POA: Diagnosis not present

## 2013-01-08 DIAGNOSIS — M25569 Pain in unspecified knee: Secondary | ICD-10-CM | POA: Diagnosis not present

## 2013-02-02 DIAGNOSIS — H353290 Exudative age-related macular degeneration, unspecified eye, stage unspecified: Secondary | ICD-10-CM | POA: Diagnosis not present

## 2013-02-02 DIAGNOSIS — H35059 Retinal neovascularization, unspecified, unspecified eye: Secondary | ICD-10-CM | POA: Diagnosis not present

## 2013-02-02 DIAGNOSIS — H43819 Vitreous degeneration, unspecified eye: Secondary | ICD-10-CM | POA: Diagnosis not present

## 2013-02-02 DIAGNOSIS — H353190 Nonexudative age-related macular degeneration, unspecified eye, stage unspecified: Secondary | ICD-10-CM | POA: Diagnosis not present

## 2013-02-15 ENCOUNTER — Ambulatory Visit
Admission: RE | Admit: 2013-02-15 | Discharge: 2013-02-15 | Disposition: A | Discharge status: Home / Self Care | Payer: Medicare Other | Source: Ambulatory Visit | Attending: Physician Assistant | Admitting: Physician Assistant

## 2013-02-15 ENCOUNTER — Other Ambulatory Visit: Payer: Self-pay | Admitting: Physician Assistant

## 2013-02-15 DIAGNOSIS — M545 Low back pain: Secondary | ICD-10-CM

## 2013-02-15 DIAGNOSIS — M47817 Spondylosis without myelopathy or radiculopathy, lumbosacral region: Secondary | ICD-10-CM | POA: Diagnosis not present

## 2013-03-06 DIAGNOSIS — M961 Postlaminectomy syndrome, not elsewhere classified: Secondary | ICD-10-CM | POA: Diagnosis not present

## 2013-03-06 DIAGNOSIS — M1710 Unilateral primary osteoarthritis, unspecified knee: Secondary | ICD-10-CM | POA: Diagnosis not present

## 2013-03-14 DIAGNOSIS — M7750 Other enthesopathy of unspecified foot: Secondary | ICD-10-CM | POA: Diagnosis not present

## 2013-03-16 DIAGNOSIS — M961 Postlaminectomy syndrome, not elsewhere classified: Secondary | ICD-10-CM | POA: Diagnosis not present

## 2013-03-16 DIAGNOSIS — IMO0002 Reserved for concepts with insufficient information to code with codable children: Secondary | ICD-10-CM | POA: Diagnosis not present

## 2013-03-24 DIAGNOSIS — Z96659 Presence of unspecified artificial knee joint: Secondary | ICD-10-CM | POA: Diagnosis not present

## 2013-03-24 DIAGNOSIS — M25569 Pain in unspecified knee: Secondary | ICD-10-CM | POA: Diagnosis not present

## 2013-04-23 DIAGNOSIS — IMO0002 Reserved for concepts with insufficient information to code with codable children: Secondary | ICD-10-CM | POA: Diagnosis not present

## 2013-04-23 DIAGNOSIS — M961 Postlaminectomy syndrome, not elsewhere classified: Secondary | ICD-10-CM | POA: Diagnosis not present

## 2013-05-02 ENCOUNTER — Other Ambulatory Visit: Payer: Self-pay | Admitting: Anesthesiology

## 2013-05-02 DIAGNOSIS — M545 Low back pain: Secondary | ICD-10-CM

## 2013-05-08 DIAGNOSIS — T8489XA Other specified complication of internal orthopedic prosthetic devices, implants and grafts, initial encounter: Secondary | ICD-10-CM | POA: Diagnosis not present

## 2013-05-08 DIAGNOSIS — T84498A Other mechanical complication of other internal orthopedic devices, implants and grafts, initial encounter: Secondary | ICD-10-CM | POA: Diagnosis not present

## 2013-05-08 DIAGNOSIS — Y831 Surgical operation with implant of artificial internal device as the cause of abnormal reaction of the patient, or of later complication, without mention of misadventure at the time of the procedure: Secondary | ICD-10-CM | POA: Diagnosis not present

## 2013-05-08 DIAGNOSIS — M2020 Hallux rigidus, unspecified foot: Secondary | ICD-10-CM | POA: Diagnosis not present

## 2013-05-08 DIAGNOSIS — G8918 Other acute postprocedural pain: Secondary | ICD-10-CM | POA: Diagnosis not present

## 2013-05-08 HISTORY — PX: OTHER SURGICAL HISTORY: SHX169

## 2013-05-09 ENCOUNTER — Ambulatory Visit
Admission: RE | Admit: 2013-05-09 | Discharge: 2013-05-09 | Disposition: A | Discharge status: Home / Self Care | Payer: Medicare Other | Source: Ambulatory Visit | Attending: Anesthesiology | Admitting: Anesthesiology

## 2013-05-09 DIAGNOSIS — M545 Low back pain: Secondary | ICD-10-CM

## 2013-05-09 DIAGNOSIS — M48061 Spinal stenosis, lumbar region without neurogenic claudication: Secondary | ICD-10-CM | POA: Diagnosis not present

## 2013-05-09 DIAGNOSIS — M4310 Spondylolisthesis, site unspecified: Secondary | ICD-10-CM | POA: Diagnosis not present

## 2013-05-09 MED ORDER — GADOBENATE DIMEGLUMINE 529 MG/ML IV SOLN
18.0000 mL | Freq: Once | INTRAVENOUS | Status: AC | PRN
  Administered 2013-05-09: 18 mL via INTRAVENOUS

## 2013-05-21 DIAGNOSIS — IMO0002 Reserved for concepts with insufficient information to code with codable children: Secondary | ICD-10-CM | POA: Diagnosis not present

## 2013-05-21 DIAGNOSIS — M961 Postlaminectomy syndrome, not elsewhere classified: Secondary | ICD-10-CM | POA: Diagnosis not present

## 2013-05-21 DIAGNOSIS — M47817 Spondylosis without myelopathy or radiculopathy, lumbosacral region: Secondary | ICD-10-CM | POA: Diagnosis not present

## 2013-05-30 DIAGNOSIS — M48061 Spinal stenosis, lumbar region without neurogenic claudication: Secondary | ICD-10-CM | POA: Diagnosis not present

## 2013-05-30 DIAGNOSIS — IMO0002 Reserved for concepts with insufficient information to code with codable children: Secondary | ICD-10-CM | POA: Diagnosis not present

## 2013-05-30 DIAGNOSIS — M549 Dorsalgia, unspecified: Secondary | ICD-10-CM | POA: Diagnosis not present

## 2013-06-01 ENCOUNTER — Encounter: Payer: Self-pay | Admitting: *Deleted

## 2013-06-01 DIAGNOSIS — H353190 Nonexudative age-related macular degeneration, unspecified eye, stage unspecified: Secondary | ICD-10-CM | POA: Diagnosis not present

## 2013-06-01 DIAGNOSIS — H43819 Vitreous degeneration, unspecified eye: Secondary | ICD-10-CM | POA: Diagnosis not present

## 2013-06-01 DIAGNOSIS — H2510 Age-related nuclear cataract, unspecified eye: Secondary | ICD-10-CM | POA: Diagnosis not present

## 2013-06-01 DIAGNOSIS — H353290 Exudative age-related macular degeneration, unspecified eye, stage unspecified: Secondary | ICD-10-CM | POA: Diagnosis not present

## 2013-06-04 ENCOUNTER — Other Ambulatory Visit: Payer: Self-pay

## 2013-06-04 DIAGNOSIS — Z1231 Encounter for screening mammogram for malignant neoplasm of breast: Secondary | ICD-10-CM

## 2013-06-07 ENCOUNTER — Encounter: Payer: Self-pay | Admitting: Obstetrics & Gynecology

## 2013-06-07 ENCOUNTER — Ambulatory Visit (INDEPENDENT_AMBULATORY_CARE_PROVIDER_SITE_OTHER): Payer: Medicare Other | Admitting: Obstetrics & Gynecology

## 2013-06-07 VITALS — BP 142/64 | HR 64 | Resp 16 | Ht 63.5 in | Wt 198.0 lb

## 2013-06-07 DIAGNOSIS — Z124 Encounter for screening for malignant neoplasm of cervix: Secondary | ICD-10-CM

## 2013-06-07 DIAGNOSIS — Z01419 Encounter for gynecological examination (general) (routine) without abnormal findings: Secondary | ICD-10-CM

## 2013-06-07 NOTE — Progress Notes (Signed)
72 y.o. G38P2 MarriedCaucasianF here for annual exam.  Doing well.  Had total knee in 9/13.  Also had hardware removed in left foot.  No vaginal bleeding.   Blood work in November with Dr. Jarold Motto.    No LMP recorded. Patient is postmenopausal.          Sexually active: yes  The current method of family planning is post menopausal status.    Exercising: yes  walking Smoker:  no  Health Maintenance: Pap:  05/05/11 WNL History of abnormal Pap:  no MMG:  05/25/12 normal, schedule 06/29/12, had area of fat necrosis Colonoscopy:  2006 repeat in 10 years BMD:   03/10/10 normal TDaP:  6-8 years ago Screening Labs: PCP, Hb today: PCP, Urine today: PCP   reports that she has never smoked. She has never used smokeless tobacco. She reports that she drinks about 0.5 ounces of alcohol per week. She reports that she does not use illicit drugs.  Past Medical History  Diagnosis Date  . Complication of anesthesia     slow to wake up-sats drop  . Hypertension   . Hypothyroidism   . Arthritis   . GERD (gastroesophageal reflux disease)   . Blood transfusion   . Chronic pain   . PONV (postoperative nausea and vomiting)     states she had scop patch in past  . Bronchitis     finishing rx  now  . Ulcer     hx  . H/O hiatal hernia     Past Surgical History  Procedure Laterality Date  . Hammer toe surgery  4/12    lt foot-2-3  . Appendectomy    . Metatarsal osteotomy  3/12    lt  . Thumb fusion  2008    rt  . Carpal tunnel release      both  . Knee arthroscopy  2007    both  . Back surgery  09,00,10,99    4 back surg  . Orif toe fracture  11/04/2011    Procedure: OPEN REDUCTION INTERNAL FIXATION (ORIF) METATARSAL (TOE) FRACTURE;  Surgeon: Toni Arthurs, MD;  Location: [facility];  Service: Orthopedics;  Laterality: Left;  left revision orif 1st metatarsal with autograft from left calcaneous and left 2nd-3rd metatarsal weil osteotomy  . 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 Dilation  and curettage of uterus      x2  . Breast surgery      cyst  . Total knee arthroplasty  08/29/2012    Procedure: TOTAL KNEE ARTHROPLASTY;  Surgeon: Velna Ochs, MD;  Location: [facility] OR;  Service: Orthopedics;  Laterality: Right;  . Other surgical history  05/08/13    hardware removed from left foot  . Bunionectomy  03/02/11 and 11/04/11    left foot x2    Current Outpatient Prescriptions  Medication Sig Dispense Refill  . Ascorbic Acid (VITAMIN C WITH ROSE HIPS) 1000 MG tablet Take 1,000 mg by mouth daily.        Marland Kitchen aspirin 81 MG tablet Take 81 mg by mouth. Two daily      . calcium carbonate (OS-CAL - DOSED IN MG OF ELEMENTAL CALCIUM) 1250 MG tablet Take 1 tablet by mouth daily.        . celecoxib (CELEBREX) 200 MG capsule Take 200 mg by mouth daily.      . diazepam (VALIUM) 2 MG tablet Take 2 mg by mouth every 6 (six) hours as needed for anxiety.      Marland Kitchen  EPINEPHrine (EPI-PEN) 0.3 mg/0.3 mL DEVI Inject 0.3 mg into the muscle once. For various allergic reactions.      . fexofenadine (ALLEGRA) 180 MG tablet Take 180 mg by mouth daily.      . Flaxseed, Linseed, (FLAX SEED OIL PO) Take 1 capsule by mouth 2 (two) times daily.      . furosemide (LASIX) 40 MG tablet Take 40 mg by mouth daily.        Marland Kitchen gabapentin (NEURONTIN) 600 MG tablet Take 600 mg by mouth 3 (three) times daily.        . Glycerin-Polysorbate 80 (REFRESH DRY EYE THERAPY OP) Place 1 drop into both eyes every 6 (six) hours as needed. Dry eyes .      . levothyroxine (SYNTHROID, LEVOTHROID) 75 MCG tablet Take 75 mcg by mouth daily.        . meloxicam (MOBIC) 15 MG tablet 15 mg as needed.      . methadone (DOLOPHINE) 5 MG tablet Take 5 mg by mouth QID.        Marland Kitchen metoprolol succinate (TOPROL-XL) 25 MG 24 hr tablet Take 25 mg by mouth daily.        . Multiple Vitamin (MULTIVITAMIN WITH MINERALS) TABS Take 1 tablet by mouth daily.      Marland Kitchen omeprazole (PRILOSEC) 20 MG capsule Take 20 mg by mouth daily.        Marland Kitchen oxyCODONE (OXY IR/ROXICODONE)  5 MG immediate release tablet Take 1-2 tablets (5-10 mg total) by mouth every 3 (three) hours as needed.  80 tablet  0  . pravastatin (PRAVACHOL) 20 MG tablet 20 mg daily.      Marland Kitchen senna (SENOKOT) 8.6 MG tablet Take 1 tablet by mouth 2 (two) times daily.      . traMADol (ULTRAM) 50 MG tablet Take 50 mg by mouth. As needed      . zolpidem (AMBIEN CR) 12.5 MG CR tablet Take 12.5 mg by mouth at bedtime.      Marland Kitchen loratadine (CLARITIN) 10 MG tablet Take 10 mg by mouth daily.       No current facility-administered medications for this visit.    Family History  Problem Relation Age of Onset  . Hyperlipidemia Father   . Hypertension Father   . Heart attack Father 27    deceased    ROS:  Pertinent items are noted in HPI.  Otherwise, a comprehensive ROS was negative.  Exam:   BP 142/64  Pulse 64  Resp 16  Ht 5' 3.5" (1.613 m)  Wt 198 lb (89.812 kg)  BMI 34.52 kg/m2  Weight change:-2lb  Height: 5' 3.5" (161.3 cm)  Ht Readings from Last 3 Encounters:  06/07/13 5' 3.5" (1.613 m)  08/30/12 5\' 4"  (1.626 m)  08/30/12 5\' 4"  (1.626 m)    General appearance: alert, cooperative and appears stated age Head: Normocephalic, without obvious abnormality, atraumatic Neck: no adenopathy, supple, symmetrical, trachea midline and thyroid normal to inspection and palpation Lungs: clear to auscultation bilaterally Breasts: normal appearance, no masses or tenderness Heart: regular rate and rhythm Abdomen: soft, non-tender; bowel sounds normal; no masses,  no organomegaly Extremities: extremities normal, atraumatic, no cyanosis or edema Skin: Skin color, texture, turgor normal. No rashes or lesions Lymph nodes: Cervical, supraclavicular, and axillary nodes normal. No abnormal inguinal nodes palpated Neurologic: Grossly normal   Pelvic: External genitalia:  no lesions              Urethra:  normal appearing urethra with  no masses, tenderness or lesions              Bartholins and Skenes: normal                  Vagina: normal appearing vagina with normal color and discharge, no lesions, atrophic tissue, large 3rd degree cystocele              Cervix: no lesions              Pap taken: yes Bimanual Exam:  Uterus:  normal size, contour, position, consistency, mobility, non-tender              Adnexa: normal adnexa and no mass, fullness, tenderness               Rectovaginal: Confirms               Anus:  normal sphincter tone, no lesions  A:  Well Woman with normal exam Hypertension Elevated lipids Chronic back pain Large cystocele  P:   Mammogram scheduled 7/14.  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 pap smear obtained. Dr. Jarold Motto follows labs. return annually or prn  An After Visit Summary was printed and given to the patient.

## 2013-06-11 LAB — IPS PAP SMEAR ONLY

## 2013-06-15 DIAGNOSIS — M47817 Spondylosis without myelopathy or radiculopathy, lumbosacral region: Secondary | ICD-10-CM | POA: Diagnosis not present

## 2013-06-15 DIAGNOSIS — IMO0002 Reserved for concepts with insufficient information to code with codable children: Secondary | ICD-10-CM | POA: Diagnosis not present

## 2013-06-15 DIAGNOSIS — M961 Postlaminectomy syndrome, not elsewhere classified: Secondary | ICD-10-CM | POA: Diagnosis not present

## 2013-06-28 ENCOUNTER — Other Ambulatory Visit: Payer: Self-pay | Admitting: Neurosurgery

## 2013-06-28 DIAGNOSIS — M48061 Spinal stenosis, lumbar region without neurogenic claudication: Secondary | ICD-10-CM

## 2013-06-29 ENCOUNTER — Ambulatory Visit
Admission: RE | Admit: 2013-06-29 | Discharge: 2013-06-29 | Disposition: A | Discharge status: Home / Self Care | Payer: Medicare Other | Source: Ambulatory Visit

## 2013-06-29 DIAGNOSIS — Z1231 Encounter for screening mammogram for malignant neoplasm of breast: Secondary | ICD-10-CM | POA: Diagnosis not present

## 2013-07-02 ENCOUNTER — Ambulatory Visit
Admission: RE | Admit: 2013-07-02 | Discharge: 2013-07-02 | Disposition: A | Discharge status: Home / Self Care | Payer: Medicare Other | Source: Ambulatory Visit | Attending: Neurosurgery | Admitting: Neurosurgery

## 2013-07-02 VITALS — BP 139/67 | HR 69

## 2013-07-02 DIAGNOSIS — M545 Low back pain: Secondary | ICD-10-CM | POA: Diagnosis not present

## 2013-07-02 DIAGNOSIS — M5126 Other intervertebral disc displacement, lumbar region: Secondary | ICD-10-CM | POA: Diagnosis not present

## 2013-07-02 DIAGNOSIS — M48061 Spinal stenosis, lumbar region without neurogenic claudication: Secondary | ICD-10-CM | POA: Diagnosis not present

## 2013-07-02 DIAGNOSIS — M4310 Spondylolisthesis, site unspecified: Secondary | ICD-10-CM | POA: Diagnosis not present

## 2013-07-02 MED ORDER — HYDROMORPHONE HCL PF 2 MG/ML IJ SOLN
2.0000 mg | Freq: Once | INTRAMUSCULAR | Status: AC
  Administered 2013-07-02: 2 mg via INTRAMUSCULAR

## 2013-07-02 MED ORDER — DIAZEPAM 5 MG PO TABS
5.0000 mg | ORAL_TABLET | Freq: Once | ORAL | Status: AC
  Administered 2013-07-02: 5 mg via ORAL

## 2013-07-02 MED ORDER — IOHEXOL 180 MG/ML  SOLN
17.0000 mL | Freq: Once | INTRAMUSCULAR | Status: AC | PRN
  Administered 2013-07-02: 17 mL via INTRATHECAL

## 2013-07-02 MED ORDER — ONDANSETRON HCL 4 MG/2ML IJ SOLN
4.0000 mg | Freq: Once | INTRAMUSCULAR | Status: AC
  Administered 2013-07-02: 4 mg via INTRAMUSCULAR

## 2013-07-02 NOTE — Progress Notes (Signed)
Patient's husband notified of 10:00am discharge time.

## 2013-07-09 DIAGNOSIS — IMO0002 Reserved for concepts with insufficient information to code with codable children: Secondary | ICD-10-CM | POA: Diagnosis not present

## 2013-07-09 DIAGNOSIS — M48061 Spinal stenosis, lumbar region without neurogenic claudication: Secondary | ICD-10-CM | POA: Diagnosis not present

## 2013-07-09 DIAGNOSIS — M549 Dorsalgia, unspecified: Secondary | ICD-10-CM | POA: Diagnosis not present

## 2013-07-09 DIAGNOSIS — Z6829 Body mass index (BMI) 29.0-29.9, adult: Secondary | ICD-10-CM | POA: Diagnosis not present

## 2013-07-11 ENCOUNTER — Other Ambulatory Visit: Payer: Self-pay

## 2013-07-13 DIAGNOSIS — M961 Postlaminectomy syndrome, not elsewhere classified: Secondary | ICD-10-CM | POA: Diagnosis not present

## 2013-07-13 DIAGNOSIS — IMO0002 Reserved for concepts with insufficient information to code with codable children: Secondary | ICD-10-CM | POA: Diagnosis not present

## 2013-07-19 ENCOUNTER — Other Ambulatory Visit: Payer: Self-pay | Admitting: Neurosurgery

## 2013-08-01 DIAGNOSIS — M1710 Unilateral primary osteoarthritis, unspecified knee: Secondary | ICD-10-CM | POA: Diagnosis not present

## 2013-08-01 DIAGNOSIS — M25519 Pain in unspecified shoulder: Secondary | ICD-10-CM | POA: Diagnosis not present

## 2013-08-01 DIAGNOSIS — M25569 Pain in unspecified knee: Secondary | ICD-10-CM | POA: Diagnosis not present

## 2013-08-13 DIAGNOSIS — IMO0002 Reserved for concepts with insufficient information to code with codable children: Secondary | ICD-10-CM | POA: Diagnosis not present

## 2013-08-13 DIAGNOSIS — M47817 Spondylosis without myelopathy or radiculopathy, lumbosacral region: Secondary | ICD-10-CM | POA: Diagnosis not present

## 2013-08-13 DIAGNOSIS — M961 Postlaminectomy syndrome, not elsewhere classified: Secondary | ICD-10-CM | POA: Diagnosis not present

## 2013-08-13 DIAGNOSIS — Z79899 Other long term (current) drug therapy: Secondary | ICD-10-CM | POA: Diagnosis not present

## 2013-08-20 ENCOUNTER — Encounter (HOSPITAL_COMMUNITY): Payer: Self-pay | Admitting: Pharmacy Technician

## 2013-08-20 DIAGNOSIS — M549 Dorsalgia, unspecified: Secondary | ICD-10-CM | POA: Diagnosis not present

## 2013-08-20 DIAGNOSIS — M48061 Spinal stenosis, lumbar region without neurogenic claudication: Secondary | ICD-10-CM | POA: Diagnosis not present

## 2013-08-20 DIAGNOSIS — IMO0002 Reserved for concepts with insufficient information to code with codable children: Secondary | ICD-10-CM | POA: Diagnosis not present

## 2013-08-22 NOTE — Pre-Procedure Instructions (Signed)
[name] [name]  08/22/2013   Your procedure is scheduled on:  Monday, September 22nd.  Report to [facility], Main Entrance/ Entrance "A" at 5:30 AM.  Call this number if you have problems the morning of surgery: [phone]   Remember:   Do not eat food or drink liquids after midnight.   Take these medicines the morning of surgery with A SIP OF WATER: fexofenadine (ALLEGRA), gabapentin (NEURONTIN),levothyroxine (SYNTHROID, LEVOTHROID), loratadine (CLARITIN), methadone (DOLOPHINE), metoprolol succinate (TOPROL-XL), omeprazole (PRILOSEC).   Use inhaler and eye drops as needed.  Take if needed: oxyCODONE (OXY IR/ROXICODONE), diazepam (VALIUM).  Stop taking Aspirin, Coumadin, Plavix, Effient and Herbal medications.  Do not take any NSAIDs ie: Ibuprofen,  Advil,Naproxen,  nabumetone (RELAFEN) or any medication containing Aspirin.   Do not wear jewelry, make-up or nail polish.  Do not wear lotions, powders, or perfumes. You may wear deodorant.  Do not shave 48 hours prior to surgery.   Do not bring valuables to the hospital.  [facility] is not responsible  for any belongings or valuables.  Contacts, dentures or bridgework may not be worn into surgery.  Leave suitcase in the car. After surgery it may be brought to your room.  For patients admitted to the hospital, checkout time is 11:00 AM the day of discharge.     Special Instructions: Shower using CHG 2 nights before surgery and the night before surgery.  If you shower the day of surgery use CHG.  Use special wash - you have one bottle of CHG for all showers.  You should use approximately 1/3 of the bottle for each shower.   Please read over the following fact sheets that you were given: Pain Booklet, Coughing and Deep Breathing, Blood Transfusion Information and Surgical Site Infection Prevention

## 2013-08-23 ENCOUNTER — Encounter (HOSPITAL_COMMUNITY)
Admission: RE | Admit: 2013-08-23 | Discharge: 2013-08-23 | Disposition: A | Discharge status: Home / Self Care | Payer: Medicare Other | Source: Ambulatory Visit | Attending: Neurosurgery | Admitting: Neurosurgery

## 2013-08-23 ENCOUNTER — Encounter (HOSPITAL_COMMUNITY): Payer: Self-pay

## 2013-08-23 ENCOUNTER — Encounter (HOSPITAL_COMMUNITY)
Admission: RE | Admit: 2013-08-23 | Discharge: 2013-08-23 | Disposition: A | Discharge status: Home / Self Care | Payer: Medicare Other | Source: Ambulatory Visit | Attending: Anesthesiology | Admitting: Anesthesiology

## 2013-08-23 DIAGNOSIS — Z01812 Encounter for preprocedural laboratory examination: Secondary | ICD-10-CM | POA: Diagnosis not present

## 2013-08-23 DIAGNOSIS — Z01818 Encounter for other preprocedural examination: Secondary | ICD-10-CM | POA: Insufficient documentation

## 2013-08-23 DIAGNOSIS — J40 Bronchitis, not specified as acute or chronic: Secondary | ICD-10-CM | POA: Diagnosis not present

## 2013-08-23 DIAGNOSIS — Z01810 Encounter for preprocedural cardiovascular examination: Secondary | ICD-10-CM | POA: Insufficient documentation

## 2013-08-23 HISTORY — DX: Abnormal immunological findings in specimens from other organs, systems and tissues: R89.4

## 2013-08-23 HISTORY — DX: Cystocele, unspecified: N81.10

## 2013-08-23 HISTORY — DX: Chronic sinusitis, unspecified: J32.9

## 2013-08-23 HISTORY — DX: Myoneural disorder, unspecified: G70.9

## 2013-08-23 LAB — BASIC METABOLIC PANEL
BUN: 17 mg/dL (ref 6–23)
CO2: 33 mEq/L — ABNORMAL HIGH (ref 19–32)
Chloride: 97 mEq/L (ref 96–112)
Creatinine, Ser: 0.79 mg/dL (ref 0.50–1.10)
Glucose, Bld: 100 mg/dL — ABNORMAL HIGH (ref 70–99)

## 2013-08-23 LAB — CBC
HCT: 39.1 % (ref 36.0–46.0)
MCV: 92 fL (ref 78.0–100.0)
RDW: 13.5 % (ref 11.5–15.5)
WBC: 5.1 10*3/uL (ref 4.0–10.5)

## 2013-08-23 LAB — TYPE AND SCREEN

## 2013-08-23 NOTE — Progress Notes (Signed)
Pt. Seen by Dr. Donavan Burnet, for PCP, no previous cardiac problems that have prompted cardiac surveillance.

## 2013-08-23 NOTE — Progress Notes (Signed)
Notified  Darl Pikes at Dr,.Jenkins office of +pcr screen, furnished phone no. For pharmacy.

## 2013-08-26 MED ORDER — CEFAZOLIN SODIUM-DEXTROSE 2-3 GM-% IV SOLR
2.0000 g | INTRAVENOUS | Status: AC
  Administered 2013-08-27 (×2): 2 g via INTRAVENOUS

## 2013-08-27 ENCOUNTER — Encounter (HOSPITAL_COMMUNITY)
Admission: RE | Disposition: A | Discharge status: Home Health | Payer: Medicare Other | Source: Ambulatory Visit | Attending: Neurosurgery

## 2013-08-27 ENCOUNTER — Encounter (HOSPITAL_COMMUNITY): Payer: Self-pay | Admitting: Anesthesiology

## 2013-08-27 ENCOUNTER — Inpatient Hospital Stay (HOSPITAL_COMMUNITY): Payer: Medicare Other

## 2013-08-27 ENCOUNTER — Inpatient Hospital Stay (HOSPITAL_COMMUNITY): Payer: Medicare Other | Admitting: Anesthesiology

## 2013-08-27 ENCOUNTER — Inpatient Hospital Stay (HOSPITAL_COMMUNITY)
Admission: RE | Admit: 2013-08-27 | Discharge: 2013-09-01 | DRG: 460 | Disposition: A | Discharge status: Home Health | Payer: Medicare Other | Source: Ambulatory Visit | Attending: Neurosurgery | Admitting: Neurosurgery

## 2013-08-27 ENCOUNTER — Encounter (HOSPITAL_COMMUNITY): Payer: Self-pay | Admitting: Surgery

## 2013-08-27 DIAGNOSIS — Z472 Encounter for removal of internal fixation device: Secondary | ICD-10-CM

## 2013-08-27 DIAGNOSIS — I10 Essential (primary) hypertension: Secondary | ICD-10-CM | POA: Diagnosis not present

## 2013-08-27 DIAGNOSIS — Z981 Arthrodesis status: Secondary | ICD-10-CM | POA: Diagnosis not present

## 2013-08-27 DIAGNOSIS — Z79899 Other long term (current) drug therapy: Secondary | ICD-10-CM | POA: Diagnosis not present

## 2013-08-27 DIAGNOSIS — Z96659 Presence of unspecified artificial knee joint: Secondary | ICD-10-CM

## 2013-08-27 DIAGNOSIS — Z7982 Long term (current) use of aspirin: Secondary | ICD-10-CM | POA: Diagnosis not present

## 2013-08-27 DIAGNOSIS — K219 Gastro-esophageal reflux disease without esophagitis: Secondary | ICD-10-CM | POA: Diagnosis present

## 2013-08-27 DIAGNOSIS — M4310 Spondylolisthesis, site unspecified: Secondary | ICD-10-CM | POA: Diagnosis not present

## 2013-08-27 DIAGNOSIS — Q762 Congenital spondylolisthesis: Secondary | ICD-10-CM

## 2013-08-27 DIAGNOSIS — M48061 Spinal stenosis, lumbar region without neurogenic claudication: Secondary | ICD-10-CM | POA: Diagnosis not present

## 2013-08-27 DIAGNOSIS — IMO0002 Reserved for concepts with insufficient information to code with codable children: Secondary | ICD-10-CM | POA: Diagnosis not present

## 2013-08-27 DIAGNOSIS — M51379 Other intervertebral disc degeneration, lumbosacral region without mention of lumbar back pain or lower extremity pain: Principal | ICD-10-CM | POA: Diagnosis present

## 2013-08-27 DIAGNOSIS — M519 Unspecified thoracic, thoracolumbar and lumbosacral intervertebral disc disorder: Secondary | ICD-10-CM | POA: Diagnosis not present

## 2013-08-27 DIAGNOSIS — M5137 Other intervertebral disc degeneration, lumbosacral region: Secondary | ICD-10-CM | POA: Diagnosis not present

## 2013-08-27 DIAGNOSIS — E039 Hypothyroidism, unspecified: Secondary | ICD-10-CM | POA: Diagnosis present

## 2013-08-27 DIAGNOSIS — M79609 Pain in unspecified limb: Secondary | ICD-10-CM | POA: Diagnosis not present

## 2013-08-27 SURGERY — POSTERIOR LUMBAR FUSION 3 WITH HARDWARE REMOVAL
Anesthesia: General | Site: Back | Wound class: Clean

## 2013-08-27 MED ORDER — FENTANYL CITRATE 0.05 MG/ML IJ SOLN
INTRAMUSCULAR | Status: DC | PRN
  Administered 2013-08-27 (×2): 50 ug via INTRAVENOUS
  Administered 2013-08-27: 100 ug via INTRAVENOUS
  Administered 2013-08-27: 50 ug via INTRAVENOUS
  Administered 2013-08-27: 100 ug via INTRAVENOUS
  Administered 2013-08-27: 50 ug via INTRAVENOUS

## 2013-08-27 MED ORDER — LACTATED RINGERS IV SOLN
INTRAVENOUS | Status: DC
  Administered 2013-08-27 – 2013-08-28 (×2): via INTRAVENOUS
  Administered 2013-08-28: 75 mL/h via INTRAVENOUS

## 2013-08-27 MED ORDER — METOPROLOL SUCCINATE ER 25 MG PO TB24
25.0000 mg | ORAL_TABLET | Freq: Every day | ORAL | Status: DC
  Administered 2013-08-28 – 2013-08-31 (×4): 25 mg via ORAL
  Filled 2013-08-27 (×6): qty 1

## 2013-08-27 MED ORDER — CEFAZOLIN SODIUM-DEXTROSE 2-3 GM-% IV SOLR
INTRAVENOUS | Status: AC
  Filled 2013-08-27: qty 50

## 2013-08-27 MED ORDER — PROPOFOL 10 MG/ML IV BOLUS
INTRAVENOUS | Status: DC | PRN
  Administered 2013-08-27: 130 mg via INTRAVENOUS

## 2013-08-27 MED ORDER — ROCURONIUM BROMIDE 100 MG/10ML IV SOLN
INTRAVENOUS | Status: DC | PRN
  Administered 2013-08-27: 50 mg via INTRAVENOUS

## 2013-08-27 MED ORDER — ACETAMINOPHEN 650 MG RE SUPP: 650.0000 mg | RECTAL | Status: DC | PRN

## 2013-08-27 MED ORDER — ONDANSETRON HCL 4 MG/2ML IJ SOLN: 4.0000 mg | Freq: Four times a day (QID) | INTRAMUSCULAR | Status: DC | PRN

## 2013-08-27 MED ORDER — NEOSTIGMINE METHYLSULFATE 1 MG/ML IJ SOLN
INTRAMUSCULAR | Status: DC | PRN
  Administered 2013-08-27: 4 mg via INTRAVENOUS

## 2013-08-27 MED ORDER — SODIUM CHLORIDE 0.9 % IJ SOLN: 9.0000 mL | INTRAMUSCULAR | Status: DC | PRN

## 2013-08-27 MED ORDER — SODIUM CHLORIDE 0.9 % IV SOLN
INTRAVENOUS | Status: DC | PRN
  Administered 2013-08-27: 14:00:00 via INTRAVENOUS

## 2013-08-27 MED ORDER — SCOPOLAMINE 1 MG/3DAYS TD PT72
MEDICATED_PATCH | TRANSDERMAL | Status: AC
  Administered 2013-08-27: 1 via TRANSDERMAL
  Filled 2013-08-27: qty 1

## 2013-08-27 MED ORDER — ALBUMIN HUMAN 5 % IV SOLN
INTRAVENOUS | Status: DC | PRN
  Administered 2013-08-27: 11:00:00 via INTRAVENOUS

## 2013-08-27 MED ORDER — DIAZEPAM 5 MG PO TABS
5.0000 mg | ORAL_TABLET | Freq: Four times a day (QID) | ORAL | Status: DC | PRN
  Administered 2013-08-27 – 2013-09-01 (×5): 5 mg via ORAL
  Filled 2013-08-27 (×5): qty 1

## 2013-08-27 MED ORDER — LIDOCAINE HCL (CARDIAC) 20 MG/ML IV SOLN
INTRAVENOUS | Status: DC | PRN
  Administered 2013-08-27: 70 mg via INTRAVENOUS

## 2013-08-27 MED ORDER — OXYCODONE-ACETAMINOPHEN 5-325 MG PO TABS
1.0000 | ORAL_TABLET | ORAL | Status: DC | PRN
  Administered 2013-08-30 – 2013-09-01 (×5): 2 via ORAL
  Filled 2013-08-27 (×5): qty 2

## 2013-08-27 MED ORDER — CEFAZOLIN SODIUM-DEXTROSE 2-3 GM-% IV SOLR
2.0000 g | Freq: Three times a day (TID) | INTRAVENOUS | Status: AC
  Administered 2013-08-27 – 2013-08-28 (×2): 2 g via INTRAVENOUS
  Filled 2013-08-27 (×2): qty 50

## 2013-08-27 MED ORDER — BUPIVACAINE-EPINEPHRINE PF 0.5-1:200000 % IJ SOLN
INTRAMUSCULAR | Status: DC | PRN
  Administered 2013-08-27: 20 mL

## 2013-08-27 MED ORDER — METHADONE HCL 5 MG PO TABS
5.0000 mg | ORAL_TABLET | Freq: Four times a day (QID) | ORAL | Status: DC
  Administered 2013-08-28 – 2013-09-01 (×15): 5 mg via ORAL
  Filled 2013-08-27 (×15): qty 1

## 2013-08-27 MED ORDER — LEVOTHYROXINE SODIUM 75 MCG PO TABS
75.0000 ug | ORAL_TABLET | Freq: Every day | ORAL | Status: DC
  Administered 2013-08-28 – 2013-09-01 (×5): 75 ug via ORAL
  Filled 2013-08-27 (×6): qty 1

## 2013-08-27 MED ORDER — VECURONIUM BROMIDE 10 MG IV SOLR
INTRAVENOUS | Status: DC | PRN
  Administered 2013-08-27: 2 mg via INTRAVENOUS
  Administered 2013-08-27 (×2): 1 mg via INTRAVENOUS
  Administered 2013-08-27: 2 mg via INTRAVENOUS

## 2013-08-27 MED ORDER — BACITRACIN ZINC 500 UNIT/GM EX OINT
TOPICAL_OINTMENT | CUTANEOUS | Status: DC | PRN
  Administered 2013-08-27: 1 via TOPICAL

## 2013-08-27 MED ORDER — NALOXONE HCL 0.4 MG/ML IJ SOLN: 0.4000 mg | INTRAMUSCULAR | Status: DC | PRN

## 2013-08-27 MED ORDER — HYDROMORPHONE 0.3 MG/ML IV SOLN
INTRAVENOUS | Status: DC
  Administered 2013-08-27: 1.2 mg via INTRAVENOUS
  Administered 2013-08-27 – 2013-08-28 (×2): 0.3 mg via INTRAVENOUS
  Administered 2013-08-28: 0.6 mg via INTRAVENOUS
  Administered 2013-08-28: 2.4 mg via INTRAVENOUS
  Administered 2013-08-28: 12:00:00 via INTRAVENOUS
  Administered 2013-08-29: 0.6 mg via INTRAVENOUS
  Filled 2013-08-27: qty 25

## 2013-08-27 MED ORDER — ACETAMINOPHEN 325 MG PO TABS: 650.0000 mg | ORAL_TABLET | ORAL | Status: DC | PRN

## 2013-08-27 MED ORDER — SODIUM CHLORIDE 0.9 % IR SOLN
Status: DC | PRN
  Administered 2013-08-27: 09:00:00

## 2013-08-27 MED ORDER — MENTHOL 3 MG MT LOZG: 1.0000 | LOZENGE | OROMUCOSAL | Status: DC | PRN

## 2013-08-27 MED ORDER — FLUTICASONE PROPIONATE 50 MCG/ACT NA SUSP: 2.0000 | Freq: Every day | NASAL | Status: DC | PRN

## 2013-08-27 MED ORDER — ARTIFICIAL TEARS OP OINT
TOPICAL_OINTMENT | OPHTHALMIC | Status: DC | PRN
  Administered 2013-08-27: 1 via OPHTHALMIC

## 2013-08-27 MED ORDER — ADULT MULTIVITAMIN LIQUID CH
5.0000 mL | Freq: Every day | ORAL | Status: DC
  Administered 2013-08-27 – 2013-08-31 (×5): 5 mL via ORAL
  Filled 2013-08-27 (×5): qty 5

## 2013-08-27 MED ORDER — 0.9 % SODIUM CHLORIDE (POUR BTL) OPTIME
TOPICAL | Status: DC | PRN
  Administered 2013-08-27: 1000 mL

## 2013-08-27 MED ORDER — DEXAMETHASONE SODIUM PHOSPHATE 10 MG/ML IJ SOLN
INTRAMUSCULAR | Status: DC | PRN
  Administered 2013-08-27: 8 mg via INTRAVENOUS

## 2013-08-27 MED ORDER — HYDROMORPHONE HCL PF 1 MG/ML IJ SOLN: 0.2500 mg | INTRAMUSCULAR | Status: DC | PRN

## 2013-08-27 MED ORDER — HYDROCODONE-ACETAMINOPHEN 5-325 MG PO TABS
1.0000 | ORAL_TABLET | ORAL | Status: DC | PRN
  Administered 2013-08-28 – 2013-09-01 (×8): 2 via ORAL
  Filled 2013-08-27 (×8): qty 2

## 2013-08-27 MED ORDER — THROMBIN 20000 UNITS EX SOLR
CUTANEOUS | Status: DC | PRN
  Administered 2013-08-27: 12:00:00 via TOPICAL

## 2013-08-27 MED ORDER — DIPHENHYDRAMINE HCL 50 MG/ML IJ SOLN: 12.5000 mg | Freq: Four times a day (QID) | INTRAMUSCULAR | Status: DC | PRN

## 2013-08-27 MED ORDER — ALUM & MAG HYDROXIDE-SIMETH 200-200-20 MG/5ML PO SUSP: 30.0000 mL | Freq: Four times a day (QID) | ORAL | Status: DC | PRN

## 2013-08-27 MED ORDER — LORATADINE 10 MG PO TABS: 10.0000 mg | ORAL_TABLET | Freq: Every day | ORAL | Status: DC

## 2013-08-27 MED ORDER — HYDROMORPHONE 0.3 MG/ML IV SOLN
INTRAVENOUS | Status: AC
  Filled 2013-08-27: qty 25

## 2013-08-27 MED ORDER — PANTOPRAZOLE SODIUM 40 MG PO TBEC
40.0000 mg | DELAYED_RELEASE_TABLET | Freq: Every day | ORAL | Status: DC
  Administered 2013-08-28 – 2013-09-01 (×4): 40 mg via ORAL
  Filled 2013-08-27 (×4): qty 1

## 2013-08-27 MED ORDER — ONDANSETRON HCL 4 MG/2ML IJ SOLN: 4.0000 mg | INTRAMUSCULAR | Status: DC | PRN

## 2013-08-27 MED ORDER — PHENOL 1.4 % MT LIQD: 1.0000 | OROMUCOSAL | Status: DC | PRN

## 2013-08-27 MED ORDER — ONDANSETRON HCL 4 MG/2ML IJ SOLN
4.0000 mg | Freq: Four times a day (QID) | INTRAMUSCULAR | Status: DC | PRN
Start: 2013-08-27 — End: 2013-08-27

## 2013-08-27 MED ORDER — SIMVASTATIN 10 MG PO TABS
10.0000 mg | ORAL_TABLET | Freq: Every day | ORAL | Status: DC
  Administered 2013-08-27 – 2013-08-31 (×5): 10 mg via ORAL
  Filled 2013-08-27 (×6): qty 1

## 2013-08-27 MED ORDER — BUPIVACAINE LIPOSOME 1.3 % IJ SUSP
20.0000 mL | INTRAMUSCULAR | Status: AC
  Administered 2013-08-27: 20 mL
  Filled 2013-08-27: qty 20

## 2013-08-27 MED ORDER — LACTATED RINGERS IV SOLN
INTRAVENOUS | Status: DC | PRN
  Administered 2013-08-27 (×3): via INTRAVENOUS

## 2013-08-27 MED ORDER — LORATADINE 10 MG PO TABS
10.0000 mg | ORAL_TABLET | Freq: Every day | ORAL | Status: DC
  Administered 2013-08-27 – 2013-09-01 (×6): 10 mg via ORAL
  Filled 2013-08-27 (×6): qty 1

## 2013-08-27 MED ORDER — DOCUSATE SODIUM 100 MG PO CAPS: 100.0000 mg | ORAL_CAPSULE | Freq: Two times a day (BID) | ORAL | Status: DC

## 2013-08-27 MED ORDER — FUROSEMIDE 40 MG PO TABS
40.0000 mg | ORAL_TABLET | Freq: Every day | ORAL | Status: DC
  Administered 2013-08-27 – 2013-09-01 (×6): 40 mg via ORAL
  Filled 2013-08-27 (×6): qty 1

## 2013-08-27 MED ORDER — ZOLPIDEM TARTRATE 5 MG PO TABS
5.0000 mg | ORAL_TABLET | Freq: Every evening | ORAL | Status: DC | PRN
Start: 2013-08-27 — End: 2013-08-27

## 2013-08-27 MED ORDER — GABAPENTIN 600 MG PO TABS
600.0000 mg | ORAL_TABLET | Freq: Three times a day (TID) | ORAL | Status: DC
  Administered 2013-08-27 – 2013-09-01 (×14): 600 mg via ORAL
  Filled 2013-08-27 (×17): qty 1

## 2013-08-27 MED ORDER — GLYCOPYRROLATE 0.2 MG/ML IJ SOLN
INTRAMUSCULAR | Status: DC | PRN
  Administered 2013-08-27: .8 mg via INTRAVENOUS

## 2013-08-27 MED ORDER — MIDAZOLAM HCL 5 MG/5ML IJ SOLN
INTRAMUSCULAR | Status: DC | PRN
  Administered 2013-08-27: 2 mg via INTRAVENOUS

## 2013-08-27 MED ORDER — THROMBIN 20000 UNITS EX SOLR
CUTANEOUS | Status: DC | PRN
  Administered 2013-08-27: 09:00:00 via TOPICAL

## 2013-08-27 MED ORDER — DIPHENHYDRAMINE HCL 12.5 MG/5ML PO ELIX: 12.5000 mg | ORAL_SOLUTION | Freq: Four times a day (QID) | ORAL | Status: DC | PRN

## 2013-08-27 MED FILL — Heparin Sodium (Porcine) Inj 1000 Unit/ML: INTRAMUSCULAR | Qty: 30 | Status: AC

## 2013-08-27 MED FILL — Sodium Chloride Irrigation Soln 0.9%: Qty: 3000 | Status: AC

## 2013-08-27 MED FILL — Sodium Chloride IV Soln 0.9%: INTRAVENOUS | Qty: 1000 | Status: AC

## 2013-08-27 SURGICAL SUPPLY — 78 items
APL SKNCLS STERI-STRIP NONHPOA (GAUZE/BANDAGES/DRESSINGS) ×1
BAG DECANTER FOR FLEXI CONT (MISCELLANEOUS) ×2 IMPLANT
BENZOIN TINCTURE PRP APPL 2/3 (GAUZE/BANDAGES/DRESSINGS) ×2 IMPLANT
BLADE SURG ROTATE 9660 (MISCELLANEOUS) IMPLANT
BRUSH SCRUB EZ PLAIN DRY (MISCELLANEOUS) ×2 IMPLANT
BUR ACORN 6.0 (BURR) ×2 IMPLANT
BUR MATCHSTICK NEURO 3.0 LAGG (BURR) ×2 IMPLANT
CANISTER SUCTION 2500CC (MISCELLANEOUS) ×2 IMPLANT
CLOTH BEACON ORANGE TIMEOUT ST (SAFETY) ×2 IMPLANT
CONT SPEC 4OZ CLIKSEAL STRL BL (MISCELLANEOUS) ×3 IMPLANT
CORDS BIPOLAR (ELECTRODE) ×1 IMPLANT
COVER BACK TABLE 24X17X13 BIG (DRAPES) IMPLANT
CROSSLINK DANEK (Cage) ×2 IMPLANT
DRAPE C-ARM 42X72 X-RAY (DRAPES) ×4 IMPLANT
DRAPE LAPAROTOMY 100X72X124 (DRAPES) ×2 IMPLANT
DRAPE POUCH INSTRU U-SHP 10X18 (DRAPES) ×2 IMPLANT
DRAPE PROXIMA HALF (DRAPES) ×2 IMPLANT
DRAPE SURG 17X23 STRL (DRAPES) ×8 IMPLANT
ELECT BLADE 4.0 EZ CLEAN MEGAD (MISCELLANEOUS) ×2
ELECT REM PT RETURN 9FT ADLT (ELECTROSURGICAL) ×2
ELECTRODE BLDE 4.0 EZ CLN MEGD (MISCELLANEOUS) ×1 IMPLANT
ELECTRODE REM PT RTRN 9FT ADLT (ELECTROSURGICAL) ×1 IMPLANT
EVACUATOR 1/8 PVC DRAIN (DRAIN) ×1 IMPLANT
GAUZE SPONGE 4X4 16PLY XRAY LF (GAUZE/BANDAGES/DRESSINGS) ×2 IMPLANT
GLOVE BIO SURGEON STRL SZ8.5 (GLOVE) ×2 IMPLANT
GLOVE BIOGEL PI IND STRL 7.0 (GLOVE) IMPLANT
GLOVE BIOGEL PI IND STRL 7.5 (GLOVE) ×4 IMPLANT
GLOVE BIOGEL PI IND STRL 8.5 (GLOVE) IMPLANT
GLOVE BIOGEL PI INDICATOR 7.0 (GLOVE) ×1
GLOVE BIOGEL PI INDICATOR 7.5 (GLOVE) ×4
GLOVE BIOGEL PI INDICATOR 8.5 (GLOVE) ×2
GLOVE EXAM NITRILE LRG STRL (GLOVE) IMPLANT
GLOVE EXAM NITRILE MD LF STRL (GLOVE) IMPLANT
GLOVE EXAM NITRILE XL STR (GLOVE) IMPLANT
GLOVE EXAM NITRILE XS STR PU (GLOVE) IMPLANT
GLOVE SS BIOGEL STRL SZ 8 (GLOVE) ×2 IMPLANT
GLOVE SUPERSENSE BIOGEL SZ 8 (GLOVE)
GLOVE SURG SS PI 6.5 STRL IVOR (GLOVE) ×2 IMPLANT
GLOVE SURG SS PI 7.0 STRL IVOR (GLOVE) ×1 IMPLANT
GLOVE SURG SS PI 8.0 STRL IVOR (GLOVE) ×4 IMPLANT
GLOVE SURG SS PI 8.5 STRL IVOR (GLOVE) ×1
GLOVE SURG SS PI 8.5 STRL STRW (GLOVE) IMPLANT
GOWN BRE IMP SLV AUR LG STRL (GOWN DISPOSABLE) ×2 IMPLANT
GOWN BRE IMP SLV AUR XL STRL (GOWN DISPOSABLE) ×7 IMPLANT
GOWN STRL REIN 2XL LVL4 (GOWN DISPOSABLE) IMPLANT
GRANULES ACTIFUSE 10ML (Putty) ×4 IMPLANT
KIT BASIN OR (CUSTOM PROCEDURE TRAY) ×2 IMPLANT
KIT ROOM TURNOVER OR (KITS) ×2 IMPLANT
MARKER SKIN DUAL TIP RULER LAB (MISCELLANEOUS) ×1 IMPLANT
NDL HYPO 21X1.5 SAFETY (NEEDLE) IMPLANT
NEEDLE HYPO 21X1.5 SAFETY (NEEDLE) ×2 IMPLANT
NEEDLE HYPO 22GX1.5 SAFETY (NEEDLE) ×2 IMPLANT
NS IRRIG 1000ML POUR BTL (IV SOLUTION) ×2 IMPLANT
PACK FOAM VITOSS 10CC (Orthopedic Implant) ×1 IMPLANT
PACK LAMINECTOMY NEURO (CUSTOM PROCEDURE TRAY) ×2 IMPLANT
PAD ARMBOARD 7.5X6 YLW CONV (MISCELLANEOUS) ×6 IMPLANT
PATTIES SURGICAL .5 X1 (DISPOSABLE) ×2 IMPLANT
PATTIES SURGICAL 1X1 (DISPOSABLE) ×4 IMPLANT
PEEK PLIF AVS 8X25X4 DEGREE (Peek) ×6 IMPLANT
PLATE BN 2.15-2.95XLO BAR (Cage) IMPLANT
ROD L635 HEX END LINED (Rod) ×1 IMPLANT
SCREW PEDICLE VA L635 6.5X50M (Screw) ×6 IMPLANT
SCREW SET BREAK OFF (Screw) ×12 IMPLANT
SPONGE GAUZE 4X4 12PLY (GAUZE/BANDAGES/DRESSINGS) ×2 IMPLANT
SPONGE LAP 4X18 X RAY DECT (DISPOSABLE) IMPLANT
SPONGE NEURO XRAY DETECT 1X3 (DISPOSABLE) IMPLANT
SPONGE SURGIFOAM ABS GEL 100 (HEMOSTASIS) ×2 IMPLANT
STRIP CLOSURE SKIN 1/2X4 (GAUZE/BANDAGES/DRESSINGS) ×2 IMPLANT
SUT BONE WAX W31G (SUTURE) ×1 IMPLANT
SUT VIC AB 1 CT1 18XBRD ANBCTR (SUTURE) ×2 IMPLANT
SUT VIC AB 1 CT1 8-18 (SUTURE) ×6
SUT VIC AB 2-0 CP2 18 (SUTURE) ×5 IMPLANT
SYR 20CC LL (SYRINGE) ×1 IMPLANT
SYR 20ML ECCENTRIC (SYRINGE) ×2 IMPLANT
TAPE CLOTH SURG 4X10 WHT LF (GAUZE/BANDAGES/DRESSINGS) ×2 IMPLANT
TOWEL OR 17X24 6PK STRL BLUE (TOWEL DISPOSABLE) ×2 IMPLANT
TOWEL OR 17X26 10 PK STRL BLUE (TOWEL DISPOSABLE) ×2 IMPLANT
WATER STERILE IRR 1000ML POUR (IV SOLUTION) ×2 IMPLANT

## 2013-08-27 NOTE — Progress Notes (Signed)
Patient ID: [name] [name], female   DOB: July 06, 1941, 72 y.o.   MRN: [id number] Subjective:  The patient is alert.  Objective: Vital signs in last 24 hours: Temp:  [98 F (36.7 C)] 98 F (36.7 C) (09/22 4401) Pulse Rate:  [73] 73 (09/22 0633) Resp:  [20] 20 (09/22 0633) BP: (164)/(67) 164/67 mmHg (09/22 0633) SpO2:  [97 %] 97 % (09/22 0633)  Intake/Output from previous day:   Intake/Output this shift: Total I/O In: 2400 [I.V.:2000; Blood:150; IV Piggyback:250] Out: 1020 [Urine:550; Blood:470]  Physical exam the patient is moving all 4 extremities well to command.  Lab Results: No results found for this basename: WBC, HGB, HCT, PLT,  in the last 72 hours BMET No results found for this basename: NA, K, CL, CO2, GLUCOSE, BUN, CREATININE, CALCIUM,  in the last 72 hours  Studies/Results: Dg Lumbar Spine 1 View  08/27/2013   CLINICAL DATA:  Instrument localization for L1-L2, L2-L3 and L3-L4 PLIF with removal of previous hardware  EXAM: LUMBAR SPINE - 1 VIEW  COMPARISON:  Portable cross-table lateral view 0853 hr compared to prior CT of 07/02/2013  FINDINGS: Five lumbar vertebrae labeled on prior CT, current exam labeled similarly.  Bilateral pedicle screws and posterior bars present at L4-S1 with Ray cages at L4-L5.  Metallic probe via dorsal approach projects dorsal to the L1-L2 disc space.  Surgical sponge projects dorsal to T12 and L1 vertebral bodies.  Tissue spreaders project dorsal to the L2-L3 disk space and mid L4 levels.  IMPRESSION: Posterior localization intraoperatively as above.   Electronically Signed   By: Ulyses Southward M.D.   On: 08/27/2013 14:15    Assessment/Plan: Patient is doing well. I spoke with her family.  LOS: 0 days     Megahn Killings D 08/27/2013, 3:25 PM

## 2013-08-27 NOTE — H&P (Signed)
Subjective: The patient is a 72 year old white female who has had prior lumbar fusions by me in another physician. She has developed recurrent back buttocks and leg pain consistent with neurogenic claudication. She has failed medical management and was worked up with a lumbar MRI. This demonstrated severe disc degeneration, stenosis, etc. at L1-2, L2-3 and L3-4. We discussed the various treatment options including surgery. The patient, and her husband, has weighed the risks, benefits, and alternatives surgery and the patient has decided to proceed with the operation.   Past Medical History  Diagnosis Date  . Hypertension   . Hypothyroidism   . Arthritis   . GERD (gastroesophageal reflux disease)   . Blood transfusion   . Chronic pain   . Bronchitis     finishing rx  now  . Ulcer     hx  . H/O hiatal hernia   . Virus not detected 2010    postop, caught virus in hosp., respiratory symptoms, upon d/c to home, had allergic reaction /w plate size hives - not sure of the cause    . PONV (postoperative nausea and vomiting)     states she had scop patch in past,states she sets off alarms when she is waking up, she "forgets to breathe"   . Sinus infection     recent sinus infection, finished Zpak- 08/16/2013  . Neuromuscular disorder     DDD, radiculopathy- lumbar  . Female bladder prolapse     Past Surgical History  Procedure Laterality Date  . Hammer toe surgery  4/12    lt foot-2-3  . Appendectomy    . Metatarsal osteotomy  3/12    lt  . Thumb fusion  2008    rt  . Carpal tunnel release      both  . Knee arthroscopy  2007    both  . Back surgery  09,00,10,99    4 back surg  . Orif toe fracture  11/04/2011    Procedure: OPEN REDUCTION INTERNAL FIXATION (ORIF) METATARSAL (TOE) FRACTURE;  Surgeon: Toni Arthurs, MD;  Location: [facility];  Service: Orthopedics;  Laterality: Left;  left revision orif 1st metatarsal with autograft from left calcaneous and left 2nd-3rd  metatarsal weil osteotomy  . 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 Dilation and curettage of uterus      x2  . Total knee arthroplasty  08/29/2012    Procedure: TOTAL KNEE ARTHROPLASTY;  Surgeon: Velna Ochs, MD;  Location: [facility] OR;  Service: Orthopedics;  Laterality: Right;  . Other surgical history  05/08/13    hardware removed from left foot  . Bunionectomy  03/02/11 and 11/04/11    left foot x2  . Breast surgery      cyst- benign  . Joint replacement      R- TKA- 2013  . Tubal ligation      Allergies  Allergen Reactions  . Fish Allergy Swelling    [place]   (fake crab) Face swelled  . Oxycontin [Oxycodone] Hives    10mg  pink pill  . Flexeril [Cyclobenzaprine] Hives  . Red Dye Swelling  . Ciprofloxacin Hives  . Eggs Or Egg-Derived Products Nausea And Vomiting    Fully cooked eggs OK.  . Latex Other (See Comments)    Blisters.non latex bandaids are OK, pt. States LATEX - bandaids are what she reacts to  . Lisinopril Hives  . Methocarbamol Hives  . Morphine And Related Nausea Only    Does not work.   Marland Kitchen  Sulfa Antibiotics Hives    History  Substance Use Topics  . Smoking status: Never Smoker   . Smokeless tobacco: Never Used     Comment: occ wine  . Alcohol Use: 0.5 oz/week    1 drink(s) per week     Comment: occas. glass of wine     Family History  Problem Relation Age of Onset  . Hyperlipidemia Father   . Hypertension Father   . Heart attack Father 4    deceased   Prior to Admission medications   Medication Sig Start Date End Date Taking? Authorizing Provider  Ascorbic Acid (VITAMIN C PO) Take by mouth.   Yes Historical Provider, MD  aspirin EC 81 MG tablet Take 81 mg by mouth daily.   Yes Historical Provider, MD  Calcium Carbonate-Vit D-Min (CALCIUM 1200 PO) Take by mouth.   Yes Historical Provider, MD  diazepam (VALIUM) 2 MG tablet Take 2 mg by mouth 2 (two) times daily as needed (for muscle spasms).    Yes Historical Provider, MD  EPINEPHrine (EPI-PEN) 0.3 mg/0.3  mL DEVI Inject 0.3 mg into the muscle once. For various allergic reactions.   Yes Historical Provider, MD  Flaxseed, Linseed, (FLAX SEED OIL PO) Take 1 capsule by mouth 2 (two) times daily.   Yes Historical Provider, MD  fluticasone (FLONASE) 50 MCG/ACT nasal spray Place 2 sprays into the nose daily as needed (for congestion).   Yes Historical Provider, MD  furosemide (LASIX) 40 MG tablet Take 40 mg by mouth daily.    Yes Historical Provider, MD  gabapentin (NEURONTIN) 600 MG tablet Take 600 mg by mouth 3 (three) times daily.    Yes Historical Provider, MD  Glycerin-Polysorbate 80 (REFRESH DRY EYE THERAPY OP) Place 1 drop into both eyes every 6 (six) hours as needed. Dry eyes .   Yes Historical Provider, MD  levothyroxine (SYNTHROID, LEVOTHROID) 75 MCG tablet Take 75 mcg by mouth daily.    Yes Historical Provider, MD  loratadine (CLARITIN) 10 MG tablet Take 10 mg by mouth daily.   Yes Historical Provider, MD  methadone (DOLOPHINE) 5 MG tablet Take 5 mg by mouth QID.    Yes Historical Provider, MD  metoprolol succinate (TOPROL-XL) 25 MG 24 hr tablet Take 25 mg by mouth daily before breakfast.    Yes Historical Provider, MD  Multiple Vitamin (MULTIVITAMIN WITH MINERALS) TABS Take 1 tablet by mouth daily.   Yes Historical Provider, MD  Multiple Vitamin (MULTIVITAMIN) LIQD Take 5 mLs by mouth daily.   Yes Historical Provider, MD  nabumetone (RELAFEN) 750 MG tablet Take 750 mg by mouth 2 (two) times daily.   Yes Historical Provider, MD  omeprazole (PRILOSEC) 20 MG capsule Take 20 mg by mouth at bedtime.    Yes Historical Provider, MD  oxyCODONE (OXY IR/ROXICODONE) 5 MG immediate release tablet Take 5 mg by mouth every 4 (four) hours as needed for pain.   Yes Historical Provider, MD  pravastatin (PRAVACHOL) 20 MG tablet Take 20 mg by mouth at bedtime.    Yes Historical Provider, MD  senna (SENOKOT) 8.6 MG tablet Take 2 tablets by mouth 2 (two) times daily.    Yes Historical Provider, MD  zolpidem (AMBIEN  CR) 12.5 MG CR tablet Take 12.5 mg by mouth at bedtime.   Yes Historical Provider, MD  fexofenadine (ALLEGRA) 180 MG tablet Take 180 mg by mouth daily.    Historical Provider, MD     Review of Systems  Positive ROS: As above  All other systems have been reviewed and were otherwise negative with the exception of those mentioned in the HPI and as above.  Objective: Vital signs in last 24 hours: Temp:  [98 F (36.7 C)] 98 F (36.7 C) (09/22 4098) Pulse Rate:  [73] 73 (09/22 0633) Resp:  [20] 20 (09/22 0633) BP: (164)/(67) 164/67 mmHg (09/22 0633) SpO2:  [97 %] 97 % (09/22 1191)  General Appearance: Alert, cooperative, no distress, appears stated age Head: Normocephalic, without obvious abnormality, atraumatic Eyes: PERRL, conjunctiva/corneas clear, EOM's intact, fundi benign, both eyes      Ears: Normal TM's and external ear canals, both ears Throat: Lips, mucosa, and tongue normal; teeth and gums normal Neck: Supple, symmetrical, trachea midline, no adenopathy; thyroid: No enlargement/tenderness/nodules; no carotid bruit or JVD Back: Symmetric, no curvature, ROM normal, no CVA tenderness. The patient's lumbar incision is well-healed. Lungs: Clear to auscultation bilaterally, respirations unlabored Heart: Regular rate and rhythm, S1 and S2 normal, no murmur, rub or gallop Abdomen: Soft, non-tender, bowel sounds active all four quadrants, no masses, no organomegaly Extremities: Extremities normal, atraumatic, no cyanosis or edema Pulses: 2+ and symmetric all extremities Skin: Skin color, texture, turgor normal, no rashes or lesions  NEUROLOGIC:   Mental status: alert and oriented, no aphasia, good attention span, Fund of knowledge/ memory ok Motor Exam - grossly normal Sensory Exam - grossly normal Reflexes:  Coordination - grossly normal Gait - grossly normal Balance - grossly normal Cranial Nerves: I: smell Not tested  II: visual acuity  OS: Normal    OD: Normal   II:  visual fields Full to confrontation  II: pupils Equal, round, reactive to light  III,VII: ptosis None  III,IV,VI: extraocular muscles  Full ROM  V: mastication Normal  V: facial light touch sensation  Normal  V,VII: corneal reflex  Present  VII: facial muscle function - upper  Normal  VII: facial muscle function - lower Normal  VIII: hearing Not tested  IX: soft palate elevation  Normal  IX,X: gag reflex Present  XI: trapezius strength  5/5  XI: sternocleidomastoid strength 5/5  XI: neck flexion strength  5/5  XII: tongue strength  Normal    Data Review Lab Results  Component Value Date   WBC 5.1 08/23/2013   HGB 13.2 08/23/2013   HCT 39.1 08/23/2013   MCV 92.0 08/23/2013   PLT 202 08/23/2013   Lab Results  Component Value Date   NA 139 08/23/2013   K 3.8 08/23/2013   CL 97 08/23/2013   CO2 33* 08/23/2013   BUN 17 08/23/2013   CREATININE 0.79 08/23/2013   GLUCOSE 100* 08/23/2013   Lab Results  Component Value Date   INR 0.99 08/18/2012    Assessment/Plan: L1-2, L2-3, and L3-4 disc degeneration, spinal stenosis, lumbago, lumbar radiculopathy, neurogenic claudication: I discussed the situation with the patient, and her husband. I reviewed her MR scan with her and pointed out the abnormalities. We have discussed the various treatment options including surgery. I have described the surgical treatment option of exploration of her lumbar fusion with an L1-2, L2-3, and L3-4 decompression, instrumentation, and fusion. I described the surgery to her. I've shown her surgical models. We have discussed the risks, benefits, alternatives, and likelihood of achieving our goals with surgery. I've answered all the patient, and her husband's questions. The patient has decided to proceed with surgery.   Karena Kinker D 08/27/2013 7:19 AM

## 2013-08-27 NOTE — Anesthesia Preprocedure Evaluation (Signed)
Anesthesia Evaluation  Patient identified by MRN, date of birth, ID band Patient awake    Reviewed: Allergy & Precautions, H&P , NPO status , Patient's Chart, lab work & pertinent test results  History of Anesthesia Complications (+) PONV  Airway Mallampati: II  Neck ROM: full    Dental   Pulmonary          Cardiovascular hypertension,     Neuro/Psych  Neuromuscular disease    GI/Hepatic hiatal hernia, GERD-  ,  Endo/Other  Hypothyroidism   Renal/GU      Musculoskeletal  (+) Arthritis -,   Abdominal   Peds  Hematology   Anesthesia Other Findings   Reproductive/Obstetrics                           Anesthesia Physical Anesthesia Plan  ASA: III  Anesthesia Plan: General   Post-op Pain Management:    Induction: Intravenous  Airway Management Planned: Oral ETT  Additional Equipment:   Intra-op Plan:   Post-operative Plan: Extubation in OR  Informed Consent: I have reviewed the patients History and Physical, chart, labs and discussed the procedure including the risks, benefits and alternatives for the proposed anesthesia with the patient or authorized representative who has indicated his/her understanding and acceptance.     Plan Discussed with: CRNA, Anesthesiologist and Surgeon  Anesthesia Plan Comments:         Anesthesia Quick Evaluation

## 2013-08-27 NOTE — Progress Notes (Signed)
Orthopedic Tech Progress Note Patient Details:  [name] [name] Jan 10, 1941 [id number]  Patient ID: [name], female   DOB: June 24, 1941, 72 y.o.   MRN: [id number] Called in bio -tech brace order  Nikki Dom 08/27/2013, 8:09 PM

## 2013-08-27 NOTE — Preoperative (Signed)
Beta Blockers   Reason not to administer Beta Blockers:Not Applicable 

## 2013-08-27 NOTE — Op Note (Signed)
Brief history: The patient is a 72 year old white female who's had several other back surgeries. She's previously undergone a lumbar instrumentation and fusion at L4-5 and L5-S1. The patient has developed back buttock and leg pain consistent with neurogenic claudication. She has failed medical management and was worked up with a lumbar MRI. This demonstrated severe disc degeneration at L1-2, L2-3 and L3-4 with spondylolisthesis and spinal stenosis. I discussed the situation with the patient and her husband. The patient has weighed the risks, benefits, and alternatives surgery and decided proceed with an exploration of her lumbar fusion with an L1-2, L2-3 and L3-4 decompression, instrumentation, and fusion.  Preoperative diagnosis: L1-2, L2-3 and L3-4 spondylolisthesis, Degenerative disc disease, spinal stenosis compressing the bilateral L2, L3 and L4 nerve roots; lumbago; lumbar radiculopathy  Postoperative diagnosis: The same  Procedure: Exploration of lumbar fusion/removal of all lumbar hardware; L2 and L3 laminectomy with bilateral L1 laminotomies to decompress the bilateral L2, L3 and L4 nerve roots(the work required to do this was in addition to the work required to do the posterior lumbar interbody fusion because of the patient's spinal stenosis, facet arthropathy. Etc. requiring a wide decompression of the nerve roots.); L1-2, L2-3 and L3-4 posterior lumbar interbody fusion with local morselized autograft bone and Actifusebone graft extender; insertion of interbody prosthesis at L1-2, L2-3 and L3-4 (globus peek interbody prosthesis); posterior mental instrumentation from L1 to S1 with globus titanium pedicle screws and rods; posterior lateral arthrodesis at L1-2, L2-3 and L3-4 with local morselized autograft bone and Vitoss bone graft extender.  Surgeon: Dr. Delma Officer  Asst.: Dr. Jillyn Hidden cram  Anesthesia: Gen. endotracheal  Estimated blood loss: 500 cc  Drains: One medium  Hemovac  Complications: None  Description of procedure: The patient was brought to the operating room by the anesthesia team. General endotracheal anesthesia was induced. The patient was turned to the prone position on the Wilson frame. The patient's lumbosacral region was then prepared with Betadine scrub and Betadine solution. Sterile drapes were applied.  I then injected the area to be incised with Marcaine with epinephrine solution. I then used the scalpel to make a linear midline incision over the L1-S1 interspace. I then used electrocautery to perform a bilateral subperiosteal dissection exposing the spinous process and lamina of L1-S1.  We then inserted the Verstrac retractor to provide exposure. We explored the prior fusion by removing the cath from the old screws, then removed the cross connector, then removed and the rods. I attempted to independently move the screws. There appeared to be a good fusion at L4-5 and L5-S1.  We began the decompression by using a scalpel to incise the L1-2 and L2-3 interspinous ligament. I then used a Leksell nodule were to remove the spinous process of L2 and L3 .  I continued the decompression by using the high speed drill to perform laminotomies at L1, L2 and L3 bilaterally. We then used the Kerrison punches to widen the laminotomy and removed the ligamentum flavum at L1-2, L2-3 and L3-4 bilaterally. We used the Kerrison punches to remove the medial facets at L1-2, L2-3 and L3-4 bilaterally. We performed wide foraminotomies about the bilateral L2, L3 and L4 nerve roots completing the decompression.  We now turned our attention to the posterior lumbar interbody fusion. I used a scalpel to incise the intervertebral disc at L1-2, L2-3 and L3-4. I then performed a partial intervertebral discectomy at L1-2, L2-3 and L3-4 using the pituitary forceps. We prepared the vertebral endplates at L1-2, L2-3 and L3-4  for the fusion by removing the soft tissues with the  curettes. We then used the trial spacers to pick the appropriate sized interbody prosthesis. We prefilled his prosthesis with a combination of local morselized autograft bone that we obtained during the decompression as well as Actifuse bone graft extender. We inserted the prefilled prosthesis into the interspace at L1-2, L2-3 and L3-4. There was a good snug fit of the prosthesis in the interspace. We then filled and the remainder of the intervertebral disc space with local morselized autograft bone and Actifuse. This completed the posterior lumbar interbody arthrodesis.  We now turned attention to the instrumentation. Under fluoroscopic guidance we cannulated the bilateral L1, L2 and L3 pedicles with the bone probe. We then removed the bone probe. We then tapped the pedicle with a 0.5 millimeter tap. We then removed the tap. We probed inside the tapped pedicle with a ball probe to rule out cortical breaches. We then inserted a 6.5 x 50 millimeter pedicle screw into the L1, L2 and L3 pedicles bilaterally under fluoroscopic guidance. We then palpated along the medial aspect of the pedicles to rule out cortical breaches. There were none. The nerve roots were not injured. We then connected the unilateral pedicle screws from L1-S1 with a lordotic rod. We compressed the construct and secured the rod in place with the caps. Placed a cross connector between the rods. We then tightened the caps appropriately. This completed the instrumentation from L1-S1.  We now turned our attention to the posterior lateral arthrodesis at L1-2, L2-3 and L3-4. We used the high-speed drill to decorticate the remainder of the facets, pars, transverse process at L1-2, L2-3 and L3-4. We then applied a combination of local morselized autograft bone and Vitoss bone graft extender over these decorticated posterior lateral structures. This completed the posterior lateral arthrodesis.  We then obtained hemostasis using bipolar electrocautery.  We irrigated the wound out with bacitracin solution. We inspected the thecal sac and nerve roots and noted they were well decompressed. We then removed the retractor. We placed a medium Hemovac drain in the epidural space and tunneled out through separate stab wound. We reapproximated patient's thoracolumbar fascia with interrupted #1 Vicryl suture. We reapproximated patient's subcutaneous tissue with interrupted 2-0 Vicryl suture. The reapproximated patient's skin with Steri-Strips and benzoin. The wound was then coated with bacitracin ointment. A sterile dressing was applied. The drapes were removed. The patient was subsequently returned to the supine position where they were extubated by the anesthesia team. He was then transported to the post anesthesia care unit in stable condition. All sponge instrument and needle counts were reportedly correct at the end of this case.

## 2013-08-27 NOTE — Transfer of Care (Signed)
Immediate Anesthesia Transfer of Care Note  Patient: [name] [name]  Procedure(s) Performed: Procedure(s): Lumbar one-two, two-three, three-four laminectomies with posterior lumbar interbody fusion with interbody prothesis posterior lateral arthrodesis and posterior segmental instrumentation and exporation of fusion and  removal of old instrumentation (N/A)  Patient Location: PACU  Anesthesia Type:General  Level of Consciousness: awake, alert  and oriented  Airway & Oxygen Therapy: Patient Spontanous Breathing and Patient connected to nasal cannula oxygen  Post-op Assessment: Report given to PACU RN and Post -op Vital signs reviewed and stable  Post vital signs: Reviewed and stable  Complications: No apparent anesthesia complications

## 2013-08-27 NOTE — Anesthesia Procedure Notes (Signed)
Procedure Name: Intubation Date/Time: 08/27/2013 7:45 AM Performed by: Gwenyth Allegra Pre-anesthesia Checklist: Emergency Drugs available, Patient identified, Timeout performed, Suction available and Patient being monitored Patient Re-evaluated:Patient Re-evaluated prior to inductionOxygen Delivery Method: Circle system utilized Preoxygenation: Pre-oxygenation with 100% oxygen Intubation Type: IV induction Ventilation: Mask ventilation without difficulty Laryngoscope Size: Mac and 3 Grade View: Grade I Tube type: Oral Tube size: 7.0 mm Number of attempts: 1 Airway Equipment and Method: Stylet Placement Confirmation: ETT inserted through vocal cords under direct vision,  breath sounds checked- equal and bilateral and positive ETCO2 Secured at: 21 cm Tube secured with: Tape Dental Injury: Teeth and Oropharynx as per pre-operative assessment

## 2013-08-28 LAB — CBC
HCT: 29.8 % — ABNORMAL LOW (ref 36.0–46.0)
MCV: 92 fL (ref 78.0–100.0)
Platelets: 237 10*3/uL (ref 150–400)
RBC: 3.24 MIL/uL — ABNORMAL LOW (ref 3.87–5.11)
RDW: 13.8 % (ref 11.5–15.5)
WBC: 8.2 10*3/uL (ref 4.0–10.5)

## 2013-08-28 LAB — BASIC METABOLIC PANEL
BUN: 12 mg/dL (ref 6–23)
CO2: 28 mEq/L (ref 19–32)
Chloride: 100 mEq/L (ref 96–112)
Creatinine, Ser: 0.8 mg/dL (ref 0.50–1.10)
GFR calc Af Amer: 84 mL/min — ABNORMAL LOW (ref >90)
GFR calc non Af Amer: 72 mL/min — ABNORMAL LOW (ref >90)
Potassium: 4.1 mEq/L (ref 3.5–5.1)
Sodium: 139 mEq/L (ref 135–145)

## 2013-08-28 NOTE — Progress Notes (Signed)
UR COMPLETED  

## 2013-08-28 NOTE — Evaluation (Addendum)
Occupational Therapy Evaluation Patient Details Name: [name] [name] MRN: [id number] DOB: 1941-09-08 Today's Date: 08/28/2013 Time: 4132-4401 OT Time Calculation (min): 31 min  OT Assessment / Plan / Recommendation History of present illness 72 y.o. s/p Exploration of lumbar fusion/removal of all lumbar hardware; L2 and L3 laminectomy with bilateral L1 laminotomies to decompress the bilateral L2, L3 and L4 nerve roots(the work required to do this was in addition to the work required to do the posterior lumbar interbody fusion because of the patient's spinal stenosis, facet arthropathy. Etc. requiring a wide decompression of the nerve roots.); L1-2, L2-3 and L3-4 posterior lumbar interbody fusion with local morselized autograft bone and Actifusebone graft extender; insertion of interbody prosthesis at L1-2, L2-3 and L3-4 (globus peek interbody prosthesis); posterior mental instrumentation from L1 to S1 with globus titanium pedicle screws and rods; posterior lateral arthrodesis at L1-2, L2-3 and L3-4 with local morselized autograft bone and Vitoss bone graft extender.   Clinical Impression   Pt presents with below problem list. Pt independent with ADLs, PTA. Pt will benefit from acute OT to increase independence prior to d/c.     OT Assessment  Patient needs continued OT Services    Follow Up Recommendations  No OT follow up;Supervision/Assistance - 24 hour    Barriers to Discharge      Equipment Recommendations  Other (comment) (AE kit)    Recommendations for Other Services    Frequency  Min 2X/week    Precautions / Restrictions Precautions Precautions: Back;Fall Precaution Booklet Issued: No Precaution Comments: Reviewed precautions with pt Required Braces or Orthoses: Spinal Brace Spinal Brace: Other (comment) (applied in supine today, because orders do not clarify) Restrictions Weight Bearing Restrictions: No   Pertinent Vitals/Pain Pain 8/10. Pt has PCA to use as needed.  Repositioned. Pt O2 after returning from bathroom on RA 86% but went up quickly-placed back on O2.     ADL  Grooming: Set up;Supervision/safety Where Assessed - Grooming: Supported sitting Upper Body Bathing: Set up;Supervision/safety Where Assessed - Upper Body Bathing: Supported sitting Lower Body Bathing: Maximal assistance Where Assessed - Lower Body Bathing: Supported sit to stand Upper Body Dressing: Set up;Supervision/safety Where Assessed - Upper Body Dressing: Supported sitting Lower Body Dressing: Maximal assistance Where Assessed - Lower Body Dressing: Supported sit to Pharmacist, hospital: Hydrographic surveyor Method: Sit to Barista: Raised toilet seat with arms (or 3-in-1 over toilet) Toileting - Clothing Manipulation and Hygiene: Moderate assistance Where Assessed - Toileting Clothing Manipulation and Hygiene: Sit to stand from 3-in-1 or toilet Tub/Shower Transfer Method: Not assessed Equipment Used: Back brace;Gait belt;Rolling walker Transfers/Ambulation Related to ADLs: Min guard ADL Comments: Pt ambulated to bathroom and practiced toilet transfer. Pt unable to fully reach while maintaining precautions for hygiene-Educated on toilet aid to assist with this. Educated that AE is available for LB ADLs. Educated on setting items up on dominant hand's side of sink to avoid breaking precautions and to use two cups for teeth care.     OT Diagnosis: Acute pain  OT Problem List: Decreased range of motion;Decreased strength;Decreased activity tolerance;Decreased knowledge of use of DME or AE;Decreased knowledge of precautions;Pain OT Treatment Interventions: Self-care/ADL training;DME and/or AE instruction;Therapeutic activities;Patient/family education;Balance training   OT Goals(Current goals can be found in the care plan section) Acute Rehab OT Goals Patient Stated Goal: to be independent OT Goal Formulation: With patient Time For Goal  Achievement: 09/04/13 Potential to Achieve Goals: Good ADL Goals Pt Will Perform Grooming: standing;with  set-up;with supervision Pt Will Perform Lower Body Bathing: with set-up;with supervision;with adaptive equipment;sit to/from stand Pt Will Perform Lower Body Dressing: with set-up;with supervision;with adaptive equipment;sit to/from stand Pt Will Transfer to Toilet: with modified independence;ambulating (3 in 1 over commode) Pt Will Perform Toileting - Clothing Manipulation and hygiene: with modified independence;with adaptive equipment;sit to/from stand Pt Will Perform Tub/Shower Transfer: Tub transfer;with supervision;ambulating;rolling walker;shower seat Additional ADL Goal #1: Pt/caregiver will be independent in donning/doffing back brace while maintaining precautions. Additional ADL Goal #2: Pt will independently verbalize and demonstrate 3/3 back precautions.   Visit Information  Last OT Received On: 08/28/13 Assistance Needed: +1 History of Present Illness: 72 y.o. s/p Exploration of lumbar fusion/removal of all lumbar hardware; L2 and L3 laminectomy with bilateral L1 laminotomies to decompress the bilateral L2, L3 and L4 nerve roots(the work required to do this was in addition to the work required to do the posterior lumbar interbody fusion because of the patient's spinal stenosis, facet arthropathy. Etc. requiring a wide decompression of the nerve roots.); L1-2, L2-3 and L3-4 posterior lumbar interbody fusion with local morselized autograft bone and Actifusebone graft extender; insertion of interbody prosthesis at L1-2, L2-3 and L3-4 (globus peek interbody prosthesis); posterior mental instrumentation from L1 to S1 with globus titanium pedicle screws and rods; posterior lateral arthrodesis at L1-2, L2-3 and L3-4 with local morselized autograft bone and Vitoss bone graft extender.       Prior Functioning     Home Living Family/patient expects to be discharged to:: Private  residence Living Arrangements: Spouse/significant other Available Help at Discharge: Family;Available 24 hours/day Type of Home: House Home Access: Stairs to enter [employer] of Steps: 4 Entrance Stairs-Rails: Right Home Equipment: Bedside commode;Shower seat Prior Function Level of Independence: Needs assistance ADL's / Homemaking Assistance Needed: family would vacuum Comments: used cane sometimes Communication Communication: No difficulties         Vision/Perception     Cognition  Cognition Arousal/Alertness: Awake/alert Behavior During Therapy: WFL for tasks assessed/performed Overall Cognitive Status: Within Functional Limits for tasks assessed    Extremity/Trunk Assessment Upper Extremity Assessment Upper Extremity Assessment: RUE deficits/detail RUE Deficits / Details: limited AROM with shoulder flexion Lower Extremity Assessment Lower Extremity Assessment: Defer to PT evaluation     Mobility Bed Mobility Bed Mobility: Rolling Right;Rolling Left;Left Sidelying to Sit;Sitting - Scoot to [place] of Bed Rolling Right: 4: Min assist Rolling Left: 4: Min guard Left Sidelying to Sit: 4: Min assist;HOB flat Sitting - Scoot to [place] of Bed: 4: Min guard Details for Bed Mobility Assistance: A to lift trunk from sidelying position and to keep trunk/legs in alignment with rolling. Transfers Transfers: Sit to Stand;Stand to Sit Sit to Stand: 4: Min guard;With upper extremity assist;From bed;From chair/3-in-1 Stand to Sit: 4: Min guard;With upper extremity assist;To chair/3-in-1 Details for Transfer Assistance: Min guard for safety. Cues for technique.     Exercise     Balance     End of Session OT - End of Session Equipment Utilized During Treatment: Gait belt;Rolling walker;Oxygen;Back brace (placed back on O2 after returning to chair) Activity Tolerance: Patient tolerated treatment well Patient left: in chair;with call bell/phone within reach;with  family/visitor present Nurse Communication: Mobility status  GO     Earlie Raveling OTR/L [phone] 08/28/2013, 1:49 PM

## 2013-08-28 NOTE — Progress Notes (Signed)
Pt is awaiting back brace foley is not discontinued at this time.[name] LPN

## 2013-08-28 NOTE — Evaluation (Signed)
Physical Therapy Evaluation Patient Details Name: [name] [name] MRN: [id number] DOB: 13-Feb-1941 Today's Date: 08/28/2013 Time: 4098-1191 PT Time Calculation (min): 28 min  PT Assessment / Plan / Recommendation History of Present Illness  72 y.o. s/p Exploration of lumbar fusion/removal of all lumbar hardware; L2 and L3 laminectomy with bilateral L1 laminotomies to decompress the bilateral L2, L3 and L4 nerve roots(the work required to do this was in addition to the work required to do the posterior lumbar interbody fusion because of the patient's spinal stenosis, facet arthropathy. Etc. requiring a wide decompression of the nerve roots.); L1-2, L2-3 and L3-4 posterior lumbar interbody fusion with local morselized autograft bone and Actifusebone graft extender; insertion of interbody prosthesis at L1-2, L2-3 and L3-4 (globus peek interbody prosthesis); posterior mental instrumentation from L1 to S1 with globus titanium pedicle screws and rods; posterior lateral arthrodesis at L1-2, L2-3 and L3-4 with local morselized autograft bone and Vitoss bone graft extender.  Clinical Impression  Pt motivated and tolerating OOB mobility well. Anticipate pt to be safe for d/c home with 24/7 assist from family, use of RW, and HHPT.    PT Assessment  Patient needs continued PT services    Follow Up Recommendations  Home health PT    Does the patient have the potential to tolerate intense rehabilitation      Barriers to Discharge        Equipment Recommendations  Rolling walker with 5" wheels    Recommendations for Other Services     Frequency Min 5X/week    Precautions / Restrictions Precautions Precautions: Back;Fall Precaution Booklet Issued: Yes (comment) Precaution Comments: Reviewed precautions with pt- pt with verbal understanding Required Braces or Orthoses: Spinal Brace Spinal Brace: Applied in sitting position Restrictions Weight Bearing Restrictions: No   Pertinent Vitals/Pain Pt using  PCA pump, reports back soreness      Mobility  Bed Mobility Bed Mobility: Rolling Left;Left Sidelying to Sit;Sitting - Scoot to Edge of Bed Rolling Right: 4: Min assist Rolling Left: 4: Min guard Left Sidelying to Sit: 4: Min assist;HOB flat Sitting - Scoot to [place] of Bed: 4: Min guard Details for Bed Mobility Assistance: v/c's for technique Transfers Transfers: Sit to Stand;Stand to Sit Sit to Stand: 4: Min guard;With upper extremity assist;From bed;From chair/3-in-1 Stand to Sit: 4: Min guard;With upper extremity assist;To chair/3-in-1 Details for Transfer Assistance: Min guard for safety. Cues for technique. Ambulation/Gait Ambulation/Gait Assistance: 4: Min guard Ambulation Distance (Feet): 100 Feet Assistive device: Rolling walker Ambulation/Gait Assistance Details: mildly unsteady, increased bilat UE WBing, 2 standing rest breaks Gait Pattern: Step-to pattern;Decreased stride length Gait velocity: slow Stairs: No    Exercises     PT Diagnosis: Difficulty walking  PT Problem List: Decreased strength;Decreased activity tolerance;Decreased balance;Decreased mobility PT Treatment Interventions: DME instruction;Gait training;Stair training;Functional mobility training;Therapeutic activities;Therapeutic exercise     PT Goals(Current goals can be found in the care plan section) Acute Rehab PT Goals Patient Stated Goal: to go home PT Goal Formulation: With patient Time For Goal Achievement: 09/04/13 Potential to Achieve Goals: Good  Visit Information  Last PT Received On: 08/28/13 Assistance Needed: +1 History of Present Illness: 72 y.o. s/p Exploration of lumbar fusion/removal of all lumbar hardware; L2 and L3 laminectomy with bilateral L1 laminotomies to decompress the bilateral L2, L3 and L4 nerve roots(the work required to do this was in addition to the work required to do the posterior lumbar interbody fusion because of the patient's spinal stenosis, facet arthropathy.  Etc. requiring  a wide decompression of the nerve roots.); L1-2, L2-3 and L3-4 posterior lumbar interbody fusion with local morselized autograft bone and Actifusebone graft extender; insertion of interbody prosthesis at L1-2, L2-3 and L3-4 (globus peek interbody prosthesis); posterior mental instrumentation from L1 to S1 with globus titanium pedicle screws and rods; posterior lateral arthrodesis at L1-2, L2-3 and L3-4 with local morselized autograft bone and Vitoss bone graft extender.       Prior Functioning  Home Living Family/patient expects to be discharged to:: Private residence Living Arrangements: Spouse/significant other Available Help at Discharge: Family;Available 24 hours/day Type of Home: House Home Access: Stairs to enter [employer] of Steps: 4 Entrance Stairs-Rails: Right Home Equipment: Bedside commode;Shower seat Prior Function Level of Independence: Needs assistance Gait / Transfers Assistance Needed: indep ADL's / Homemaking Assistance Needed: family would vacuum Comments: used cane sometimes Communication Communication: No difficulties    Cognition  Cognition Arousal/Alertness: Awake/alert Behavior During Therapy: WFL for tasks assessed/performed Overall Cognitive Status: Within Functional Limits for tasks assessed    Extremity/Trunk Assessment Upper Extremity Assessment Upper Extremity Assessment: Defer to OT evaluation RUE Deficits / Details: limited AROM with shoulder flexion Lower Extremity Assessment Lower Extremity Assessment: Generalized weakness;RLE deficits/detail RLE Deficits / Details: numbness from previous injury Cervical / Trunk Assessment Cervical / Trunk Assessment: Normal   Balance    End of Session PT - End of Session Equipment Utilized During Treatment: Gait belt;Back brace Activity Tolerance: Patient tolerated treatment well Patient left: in bed;with call bell/phone within reach Nurse Communication: Mobility status  GP      Marcene Brawn 08/28/2013, 3:53 PM  Lewis Shock, PT, DPT Pager #: [phone] Office #: [phone]

## 2013-08-28 NOTE — Progress Notes (Signed)
Patient ID: [name] [name], female   DOB: 1941/09/12, 72 y.o.   MRN: [id number] Subjective:  The patient is alert and pleasant. She looks well. She has no complaints except  her back is sore.  Objective: Vital signs in last 24 hours: Temp:  [97.7 F (36.5 C)-99.2 F (37.3 C)] 98.2 F (36.8 C) (09/23 0422) Pulse Rate:  [70-107] 95 (09/23 0422) Resp:  [9-18] 10 (09/23 0425) BP: (106-159)/(56-70) 110/65 mmHg (09/23 0422) SpO2:  [92 %-100 %] 95 % (09/23 0425) FiO2 (%):  [95 %-97 %] 95 % (09/23 0425)  Intake/Output from previous day: 09/22 0701 - 09/23 0700 In: 4120 [I.V.:3720; Blood:150; IV Piggyback:250] Out: 2000 [Urine:1050; Drains:240; Blood:470] Intake/Output this shift:    Physical exam the patient is alert and oriented. She is moving her lower extremities well.  Lab Results:  Recent Labs  08/28/13 0420  WBC 8.2  HGB 10.3*  HCT 29.8*  PLT 237   BMET  Recent Labs  08/28/13 0420  NA 139  K 4.1  CL 100  CO2 28  GLUCOSE 152*  BUN 12  CREATININE 0.80  CALCIUM 8.4    Studies/Results: Dg Lumbar Spine 2-3 Views  08/27/2013   CLINICAL DATA:  L1-2 through L3-4 posterior fixation.  EXAM: DG C-ARM 61-120 MIN; LUMBAR SPINE - 2-3 VIEW  TECHNIQUE: Two intraoperative images submitted.  COMPARISON:  07/02/2013 CT myelogram.  FLUOROSCOPY TIME:  0 min and 21 seconds  FINDINGS: AP and lateral views. Remote history of L4-S1 fixation. Trans pedicle screw fixation more superiorly within the lumbar spine, likely at L1-L3. No acute hardware complication.  IMPRESSION: Extension of lower lumbar spine fixation superiorly.   Electronically Signed   By: Jeronimo Greaves   On: 08/27/2013 15:32   Dg Lumbar Spine 1 View  08/27/2013   CLINICAL DATA:  Instrument localization for L1-L2, L2-L3 and L3-L4 PLIF with removal of previous hardware  EXAM: LUMBAR SPINE - 1 VIEW  COMPARISON:  Portable cross-table lateral view 0853 hr compared to prior CT of 07/02/2013  FINDINGS: Five lumbar vertebrae labeled on  prior CT, current exam labeled similarly.  Bilateral pedicle screws and posterior bars present at L4-S1 with Ray cages at L4-L5.  Metallic probe via dorsal approach projects dorsal to the L1-L2 disc space.  Surgical sponge projects dorsal to T12 and L1 vertebral bodies.  Tissue spreaders project dorsal to the L2-L3 disk space and mid L4 levels.  IMPRESSION: Posterior localization intraoperatively as above.   Electronically Signed   By: Ulyses Southward M.D.   On: 08/27/2013 14:15   Dg C-arm 61-120 Min  08/27/2013   CLINICAL DATA:  L1-2 through L3-4 posterior fixation.  EXAM: DG C-ARM 61-120 MIN; LUMBAR SPINE - 2-3 VIEW  TECHNIQUE: Two intraoperative images submitted.  COMPARISON:  07/02/2013 CT myelogram.  FLUOROSCOPY TIME:  0 min and 21 seconds  FINDINGS: AP and lateral views. Remote history of L4-S1 fixation. Trans pedicle screw fixation more superiorly within the lumbar spine, likely at L1-L3. No acute hardware complication.  IMPRESSION: Extension of lower lumbar spine fixation superiorly.   Electronically Signed   By: Jeronimo Greaves   On: 08/27/2013 15:32    Assessment/Plan: Postop day 1: The patient is doing well. We will discontinue her Foley catheter and mobilize her with PT and OT. I will plan to DC her PCA and Hemovac drain tomorrow.  LOS: 1 day     Kerriann Kamphuis D 08/28/2013, 7:37 AM

## 2013-08-29 MED ORDER — HYDROMORPHONE HCL PF 1 MG/ML IJ SOLN: 1.0000 mg | INTRAMUSCULAR | Status: DC | PRN

## 2013-08-29 NOTE — Progress Notes (Signed)
Occupational Therapy Treatment Patient Details Name: [name] [name] MRN: [id number] DOB: Jul 13, 1941 Today's Date: 08/29/2013 Time: 4098-1191 OT Time Calculation (min): 18 min  OT Assessment / Plan / Recommendation  History of present illness 72 y.o. s/p Exploration of lumbar fusion/removal of all lumbar hardware; L2 and L3 laminectomy with bilateral L1 laminotomies to decompress the bilateral L2, L3 and L4 nerve roots(the work required to do this was in addition to the work required to do the posterior lumbar interbody fusion because of the patient's spinal stenosis, facet arthropathy. Etc. requiring a wide decompression of the nerve roots.); L1-2, L2-3 and L3-4 posterior lumbar interbody fusion with local morselized autograft bone and Actifusebone graft extender; insertion of interbody prosthesis at L1-2, L2-3 and L3-4 (globus peek interbody prosthesis); posterior mental instrumentation from L1 to S1 with globus titanium pedicle screws and rods; posterior lateral arthrodesis at L1-2, L2-3 and L3-4 with local morselized autograft bone and Vitoss bone graft extender.   OT comments  Practiced with AE for LB dressing. Pt also donned back brace. Pt doing much better this afternoon. Plan to practice tub transfer tomorrow and family mentioned going over bed mobility as her bed is high at home.   Follow Up Recommendations  No OT follow up;Supervision/Assistance - 24 hour    Barriers to Discharge       Equipment Recommendations  Other (comment) (AE kit)    Recommendations for Other Services    Frequency Min 2X/week   Progress towards OT Goals Progress towards OT goals: Progressing toward goals  Plan Discharge plan remains appropriate    Precautions / Restrictions Precautions Precautions: Back;Fall Precaution Booklet Issued: No Precaution Comments: Reviewed precautions with pt Required Braces or Orthoses: Spinal Brace Spinal Brace: Applied in sitting position Restrictions Weight Bearing  Restrictions: No   Pertinent Vitals/Pain Pain 5-6/10. Repositioned.     ADL  Upper Body Dressing: Supervision/safety;Set up Where Assessed - Upper Body Dressing: Unsupported sitting Lower Body Dressing: Min guard Where Assessed - Lower Body Dressing: Supported sit to stand Toilet Transfer: Hydrographic surveyor Method: Sit to Barista: Other (comment) (from bed and recliner chair) Equipment Used: Back brace;Gait belt;Rolling walker;Sock aid;Reacher;Long-handled sponge Transfers/Ambulation Related to ADLs: Min guard ADL Comments: Pt practiced with AE for LB dressing. Pt donned underwear and also donned/doffed socks. Cues for pt to maintain precautions. Educated on safe shoes. OT showed pt long handled sponge. Pt also donned back brace at setup/supervision level. Family present for session.  Educated on dressing technique and safety.    OT Diagnosis:    OT Problem List:   OT Treatment Interventions:     OT Goals(current goals can now be found in the care plan section) Acute Rehab OT Goals Patient Stated Goal: not stated OT Goal Formulation: With patient Time For Goal Achievement: 09/04/13 Potential to Achieve Goals: Good ADL Goals Pt Will Perform Grooming: standing;with set-up;with supervision Pt Will Perform Lower Body Bathing: with set-up;with supervision;with adaptive equipment;sit to/from stand Pt Will Perform Lower Body Dressing: with set-up;with supervision;with adaptive equipment;sit to/from stand Pt Will Transfer to Toilet: with modified independence;ambulating (3 in 1 over commode) Pt Will Perform Toileting - Clothing Manipulation and hygiene: with modified independence;with adaptive equipment;sit to/from stand Pt Will Perform Tub/Shower Transfer: Tub transfer;with supervision;ambulating;rolling walker;shower seat Additional ADL Goal #1: Pt/caregiver will be independent in donning/doffing back brace while maintaining precautions. Additional ADL Goal  #2: Pt will independently verbalize and demonstrate 3/3 back precautions.   Visit Information  Last OT Received On:  08/29/13 Assistance Needed: +1 History of Present Illness: 72 y.o. s/p Exploration of lumbar fusion/removal of all lumbar hardware; L2 and L3 laminectomy with bilateral L1 laminotomies to decompress the bilateral L2, L3 and L4 nerve roots(the work required to do this was in addition to the work required to do the posterior lumbar interbody fusion because of the patient's spinal stenosis, facet arthropathy. Etc. requiring a wide decompression of the nerve roots.); L1-2, L2-3 and L3-4 posterior lumbar interbody fusion with local morselized autograft bone and Actifusebone graft extender; insertion of interbody prosthesis at L1-2, L2-3 and L3-4 (globus peek interbody prosthesis); posterior mental instrumentation from L1 to S1 with globus titanium pedicle screws and rods; posterior lateral arthrodesis at L1-2, L2-3 and L3-4 with local morselized autograft bone and Vitoss bone graft extender.    Subjective Data      Prior Functioning       Cognition  Cognition Arousal/Alertness: Awake/alert Behavior During Therapy: WFL for tasks assessed/performed Overall Cognitive Status: Within Functional Limits for tasks assessed Memory: Decreased recall of precautions (Able to recall 2/3 back precautions: VC's for no arching)    Mobility  Bed Mobility Bed Mobility: Rolling Left;Left Sidelying to Sit;Sitting - Scoot to Edge of Bed Rolling Left: 4: Min guard Left Sidelying to Sit: 4: Min assist;HOB flat Sitting - Scoot to [place] of Bed: 4: Min guard Details for Bed Mobility Assistance: Min A to help lift trunk from sidelying position.  Cues for technique. Transfers Transfers: Sit to Stand;Stand to Sit Sit to Stand: 4: Min guard;With upper extremity assist;From bed;From chair/3-in-1 Stand to Sit: 4: Min guard;With upper extremity assist;To chair/3-in-1 Details for Transfer Assistance: Cues for  hand placement.    Exercises      Balance     End of Session OT - End of Session Equipment Utilized During Treatment: Gait belt;Rolling walker;Back brace;Oxygen Activity Tolerance: Patient tolerated treatment well Patient left: in chair;with call bell/phone within reach  GO     Earlie Raveling OTR/L [phone] 08/29/2013, 5:46 PM

## 2013-08-29 NOTE — Progress Notes (Signed)
PT Cancellation Note  Patient Details Name: [name] MRN: [id number] DOB: 1941-03-22   Cancelled Treatment:    Reason Eval/Treat Not Completed: Medical issues which prohibited therapy Pt reports "almost passing out" using bathroom this morning.  RN reports low BP this morning.  Supine BP 103/38 mmHg.  Will hold therapy this morning and check back as schedule permits.   Lynnann Knudsen,KATHrine E 08/29/2013, 10:25 AM Zenovia Jarred, PT, DPT 08/29/2013 Pager: [phone]

## 2013-08-29 NOTE — Progress Notes (Signed)
Occupational Therapy Treatment Patient Details Name: [name] [name] MRN: [id number] DOB: 1941/09/24 Today's Date: 08/29/2013 Time: 0827-0902 OT Time Calculation (min): 35 min  OT Assessment / Plan / Recommendation  History of present illness 72 y.o. s/p Exploration of lumbar fusion/removal of all lumbar hardware; L2 and L3 laminectomy with bilateral L1 laminotomies to decompress the bilateral L2, L3 and L4 nerve roots(the work required to do this was in addition to the work required to do the posterior lumbar interbody fusion because of the patient's spinal stenosis, facet arthropathy. Etc. requiring a wide decompression of the nerve roots.); L1-2, L2-3 and L3-4 posterior lumbar interbody fusion with local morselized autograft bone and Actifusebone graft extender; insertion of interbody prosthesis at L1-2, L2-3 and L3-4 (globus peek interbody prosthesis); posterior mental instrumentation from L1 to S1 with globus titanium pedicle screws and rods; posterior lateral arthrodesis at L1-2, L2-3 and L3-4 with local morselized autograft bone and Vitoss bone graft extender.   OT comments  Pt limited today due to pain and dizziness. OT educated on AE for ADLs and pt practiced with sockaid. OT educated on donning back brace.   Follow Up Recommendations  No OT follow up;Supervision/Assistance - 24 hour    Barriers to Discharge       Equipment Recommendations  Other (comment) (AE kit)    Recommendations for Other Services    Frequency Min 2X/week   Progress towards OT Goals Progress towards OT goals: Progressing toward goals  Plan Discharge plan remains appropriate    Precautions / Restrictions Precautions Precautions: Back;Fall Precaution Booklet Issued: No Precaution Comments: Reviewed precautions with pt Required Braces or Orthoses: Spinal Brace Spinal Brace: Applied in sitting position Restrictions Weight Bearing Restrictions: No   Pertinent Vitals/Pain Pain 9/10. Repositioned-pt has PCA to use  as needed. O2 in 80's on RA after returning to recliner chair. Placed back on O2. BP 100/35 when returning to recliner chair. 101/37 after sitting a little while. Nurse aware.     ADL  Grooming: Min guard (rinsed out mouth) Where Assessed - Grooming: Supported standing Upper Body Dressing: Maximal assistance (back brace) Where Assessed - Upper Body Dressing: Unsupported sitting;Supported sitting Lower Body Dressing: Moderate assistance (donned one sock) Where Assessed - Lower Body Dressing: Supported sitting Toilet Transfer: Min Pension scheme manager Method: Sit to Barista: Raised toilet seat with arms (or 3-in-1 over toilet) Equipment Used: Reacher;Long-handled sponge;Long-handled shoe horn;Rolling walker;Sock aid;Back brace;Gait belt Transfers/Ambulation Related to ADLs: Min guard for ambulation. Min A/Min guard for transfers. ADL Comments: Pt limited today due to pain and dizziness. Encouraged pt to try to donn brace. Pt with decreased balance sitting on EOB and using UE's for support-helped minimal with brace. Pt sat on 3 in 1 in bathroom. She went to sink to brush teeth, but reported she would vomit if she brushed them. Pt rinsed out mouth at sink-cues for precautions. Pt returned to chair (O2 and BP low-see vitals). OT educated and demonstrated use of AE for LB ADLs to pt and daughter. Pt practiced donning one sock with sockaid. Educated on dressing technique.    OT Diagnosis:    OT Problem List:   OT Treatment Interventions:     OT Goals(current goals can now be found in the care plan section) Acute Rehab OT Goals Patient Stated Goal: not stated OT Goal Formulation: With patient Time For Goal Achievement: 09/04/13 Potential to Achieve Goals: Good ADL Goals Pt Will Perform Grooming: standing;with set-up;with supervision Pt Will Perform Lower Body Bathing:  with set-up;with supervision;with adaptive equipment;sit to/from stand Pt Will Perform Lower Body  Dressing: with set-up;with supervision;with adaptive equipment;sit to/from stand Pt Will Transfer to Toilet: with modified independence;ambulating (3 in 1 over commode) Pt Will Perform Toileting - Clothing Manipulation and hygiene: with modified independence;with adaptive equipment;sit to/from stand Pt Will Perform Tub/Shower Transfer: Tub transfer;with supervision;ambulating;rolling walker;shower seat Additional ADL Goal #1: Pt/caregiver will be independent in donning/doffing back brace while maintaining precautions. Additional ADL Goal #2: Pt will independently verbalize and demonstrate 3/3 back precautions.   Visit Information  Last OT Received On: 08/29/13 Assistance Needed: +1 History of Present Illness: 72 y.o. s/p Exploration of lumbar fusion/removal of all lumbar hardware; L2 and L3 laminectomy with bilateral L1 laminotomies to decompress the bilateral L2, L3 and L4 nerve roots(the work required to do this was in addition to the work required to do the posterior lumbar interbody fusion because of the patient's spinal stenosis, facet arthropathy. Etc. requiring a wide decompression of the nerve roots.); L1-2, L2-3 and L3-4 posterior lumbar interbody fusion with local morselized autograft bone and Actifusebone graft extender; insertion of interbody prosthesis at L1-2, L2-3 and L3-4 (globus peek interbody prosthesis); posterior mental instrumentation from L1 to S1 with globus titanium pedicle screws and rods; posterior lateral arthrodesis at L1-2, L2-3 and L3-4 with local morselized autograft bone and Vitoss bone graft extender.    Subjective Data      Prior Functioning       Cognition  Cognition Arousal/Alertness: Awake/alert Behavior During Therapy: WFL for tasks assessed/performed Overall Cognitive Status: Within Functional Limits for tasks assessed    Mobility  Bed Mobility Bed Mobility: Rolling Right;Rolling Left;Left Sidelying to Sit;Sitting - Scoot to [place] of Bed Rolling Right:  4: Min assist Rolling Left: 3: Mod assist Left Sidelying to Sit: 3: Mod assist;HOB flat Sitting - Scoot to Edge of Bed: 4: Min guard Details for Bed Mobility Assistance: pt with limited ROM in right arm so more assist to roll to left side. Assist to elevate trunk. Pt with increased pain during session. Transfers Transfers: Sit to Stand;Stand to Sit Sit to Stand: 4: Min assist;With upper extremity assist;From bed;From chair/3-in-1;4: Min guard Stand to Sit: 4: Min guard;With upper extremity assist;To chair/3-in-1 Details for Transfer Assistance: Min A to stand from bed and Min guard for sit <> stand transfers to chair and 3 in 1    Exercises      Balance     End of Session OT - End of Session Equipment Utilized During Treatment: Gait belt;Rolling walker;Back brace;Oxygen Activity Tolerance: Patient limited by pain;Other (comment) (dizzy) Patient left: in chair;with call bell/phone within reach;with family/visitor present Nurse Communication: Other (comment) (BP and O2 sats)  GO     Earlie Raveling OTR/L [phone] 08/29/2013, 10:05 AM

## 2013-08-29 NOTE — Progress Notes (Signed)
I have reviewed this note and agree with all findings. Kati Larone Kliethermes, PT, DPT Pager: [phone]   

## 2013-08-29 NOTE — Progress Notes (Signed)
Physical Therapy Treatment Patient Details Name: [name] [name] MRN: [id number] DOB: [birth year] Today's Date: 08/29/2013 Time: 5621-3086 PT Time Calculation (min): 32 min  PT Assessment / Plan / Recommendation  History of Present Illness 72 y.o. s/p Exploration of lumbar fusion/removal of all lumbar hardware; L2 and L3 laminectomy with bilateral L1 laminotomies to decompress the bilateral L2, L3 and L4 nerve roots(the work required to do this was in addition to the work required to do the posterior lumbar interbody fusion because of the patient's spinal stenosis, facet arthropathy. Etc. requiring a wide decompression of the nerve roots.); L1-2, L2-3 and L3-4 posterior lumbar interbody fusion with local morselized autograft bone and Actifusebone graft extender; insertion of interbody prosthesis at L1-2, L2-3 and L3-4 (globus peek interbody prosthesis); posterior mental instrumentation from L1 to S1 with globus titanium pedicle screws and rods; posterior lateral arthrodesis at L1-2, L2-3 and L3-4 with local morselized autograft bone and Vitoss bone graft extender.   PT Comments   Pt state she is feeling better this afternoon and she was able to ambulate with RW and min guard to supervision and ascend/descend stairs with min guard.  Pt would continue to benefit from PT to improve functional mobility.  Follow Up Recommendations  Home health PT     Does the patient have the potential to tolerate intense rehabilitation     Barriers to Discharge        Equipment Recommendations  Rolling walker with 5" wheels    Recommendations for Other Services    Frequency Min 5X/week   Progress towards PT Goals Progress towards PT goals: Progressing toward goals  Plan Current plan remains appropriate    Precautions / Restrictions Precautions Precautions: Back;Fall Required Braces or Orthoses: Spinal Brace Spinal Brace: Applied in sitting position Restrictions Weight Bearing Restrictions: No   Pertinent  Vitals/Pain Pt reports pain at rest 6/10 and 8/10 with any movement, but pain did not increase during amb. Pt positioned to comfort at end of session. BP at rest: 118/80, O2 sat at rest and after amb. on 3L O2 [place]: 100%. No c/o of SOB or dizziness during session.    Mobility  Bed Mobility Bed Mobility: Not assessed (Pt sitting EOB upon arrival.) Transfers Transfers: Sit to Stand;Stand to Sit Sit to Stand: 4: Min guard;With upper extremity assist;From bed Stand to Sit: 4: Min guard;With upper extremity assist;To bed Details for Transfer Assistance: Min guard for safey and VC's for hand placement. BP and O2 sat assessed in sitting prior to tranfers and amb due to pt not feeling well in AM. BP at rest: 118/80 and O2 on 3L O2 [place] at rest: 100%.  No c/o SOB at rest or during mobilty. Ambulation/Gait Ambulation/Gait Assistance: 4: Min guard Ambulation Distance (Feet): 250 Feet Assistive device: Rolling walker Ambulation/Gait Assistance Details: Min guard for safety during amb. and pt progessed to supervision. One seated rest break halfway through amb., prior to acending/descending stairs.  No increase in pain during amb. and no c/o of SOB. Gait Pattern: Step-through pattern;Decreased stride length;Decreased dorsiflexion - right;Decreased dorsiflexion - left;Wide base of support Gait velocity: Decreased with increased pace during last 100' of amb. Stairs: Yes Stairs Assistance: 4: Min IT consultant Details (indicate cue type and reason): PT demonstrated how to ascend/descend stairs fowards in order to get into home and backwards to use step stool  into bed. Pt then performed steps forwards with 1 rail  and then backwards with RW and 1 HHA to mimic stepping stool all  with min guard. Stair Management Technique: One rail Right;Step to pattern;Forwards;Backwards (Instructed pt to go up with "good/RLE" and down with bad.) Number of Stairs: 4 (Ascend/decscend 2 steps going forward and backward.)     Exercises     PT Diagnosis:    PT Problem List:   PT Treatment Interventions:     PT Goals (current goals can now be found in the care plan section) Acute Rehab PT Goals Patient Stated Goal: to go home PT Goal Formulation: With patient Time For Goal Achievement: 09/04/13 Potential to Achieve Goals: Good  Visit Information  Last PT Received On: 08/29/13 Assistance Needed: +1 History of Present Illness: 72 y.o. s/p Exploration of lumbar fusion/removal of all lumbar hardware; L2 and L3 laminectomy with bilateral L1 laminotomies to decompress the bilateral L2, L3 and L4 nerve roots(the work required to do this was in addition to the work required to do the posterior lumbar interbody fusion because of the patient's spinal stenosis, facet arthropathy. Etc. requiring a wide decompression of the nerve roots.); L1-2, L2-3 and L3-4 posterior lumbar interbody fusion with local morselized autograft bone and Actifusebone graft extender; insertion of interbody prosthesis at L1-2, L2-3 and L3-4 (globus peek interbody prosthesis); posterior mental instrumentation from L1 to S1 with globus titanium pedicle screws and rods; posterior lateral arthrodesis at L1-2, L2-3 and L3-4 with local morselized autograft bone and Vitoss bone graft extender.    Subjective Data  Patient Stated Goal: to go home   Cognition  Cognition Arousal/Alertness: Awake/alert Behavior During Therapy: WFL for tasks assessed/performed Overall Cognitive Status: Within Functional Limits for tasks assessed Memory: Decreased recall of precautions (Able to recall 2/3 back precautions: VC's for no arching)    Balance     End of Session PT - End of Session Equipment Utilized During Treatment: Gait belt;Back brace Activity Tolerance: Patient tolerated treatment well Patient left: in bed;with call bell/phone within reach (Sitting up EOB) Nurse Communication: Other (comment) (nsg tech notified pt finishing sponge bath.)   GP      Sol Blazing 08/29/2013, 4:31 PM

## 2013-08-29 NOTE — Progress Notes (Signed)
Patient ID: [name] [name], female   DOB: 04/04/1941, 72 y.o.   MRN: [id number] Subjective:  Patient is alert and pleasant. She looks well. She has no complaints.  Objective: Vital signs in last 24 hours: Temp:  [98 F (36.7 C)-99.4 F (37.4 C)] 99.4 F (37.4 C) (09/24 0550) Pulse Rate:  [72-84] 83 (09/24 0550) Resp:  [10-18] 16 (09/24 0550) BP: (95-136)/(41-73) 133/46 mmHg (09/24 0550) SpO2:  [93 %-99 %] 97 % (09/24 0550) Weight:  [100.2 kg (220 lb 14.4 oz)] 100.2 kg (220 lb 14.4 oz) (09/23 2053)  Intake/Output from previous day: 09/23 0701 - 09/24 0700 In: 360 [P.O.:360] Out: 2410 [Urine:2200; Drains:210] Intake/Output this shift:    Physical exam the patient is alert and oriented x3. Her lower extremity strength is grossly normal.  Lab Results:  Recent Labs  08/28/13 0420  WBC 8.2  HGB 10.3*  HCT 29.8*  PLT 237   BMET  Recent Labs  08/28/13 0420  NA 139  K 4.1  CL 100  CO2 28  GLUCOSE 152*  BUN 12  CREATININE 0.80  CALCIUM 8.4    Studies/Results: Dg Lumbar Spine 2-3 Views  08/27/2013   CLINICAL DATA:  L1-2 through L3-4 posterior fixation.  EXAM: DG C-ARM 61-120 MIN; LUMBAR SPINE - 2-3 VIEW  TECHNIQUE: Two intraoperative images submitted.  COMPARISON:  07/02/2013 CT myelogram.  FLUOROSCOPY TIME:  0 min and 21 seconds  FINDINGS: AP and lateral views. Remote history of L4-S1 fixation. Trans pedicle screw fixation more superiorly within the lumbar spine, likely at L1-L3. No acute hardware complication.  IMPRESSION: Extension of lower lumbar spine fixation superiorly.   Electronically Signed   By: Jeronimo Greaves   On: 08/27/2013 15:32   Dg Lumbar Spine 1 View  08/27/2013   CLINICAL DATA:  Instrument localization for L1-L2, L2-L3 and L3-L4 PLIF with removal of previous hardware  EXAM: LUMBAR SPINE - 1 VIEW  COMPARISON:  Portable cross-table lateral view 0853 hr compared to prior CT of 07/02/2013  FINDINGS: Five lumbar vertebrae labeled on prior CT, current exam labeled  similarly.  Bilateral pedicle screws and posterior bars present at L4-S1 with Ray cages at L4-L5.  Metallic probe via dorsal approach projects dorsal to the L1-L2 disc space.  Surgical sponge projects dorsal to T12 and L1 vertebral bodies.  Tissue spreaders project dorsal to the L2-L3 disk space and mid L4 levels.  IMPRESSION: Posterior localization intraoperatively as above.   Electronically Signed   By: Ulyses Southward M.D.   On: 08/27/2013 14:15   Dg C-arm 61-120 Min  08/27/2013   CLINICAL DATA:  L1-2 through L3-4 posterior fixation.  EXAM: DG C-ARM 61-120 MIN; LUMBAR SPINE - 2-3 VIEW  TECHNIQUE: Two intraoperative images submitted.  COMPARISON:  07/02/2013 CT myelogram.  FLUOROSCOPY TIME:  0 min and 21 seconds  FINDINGS: AP and lateral views. Remote history of L4-S1 fixation. Trans pedicle screw fixation more superiorly within the lumbar spine, likely at L1-L3. No acute hardware complication.  IMPRESSION: Extension of lower lumbar spine fixation superiorly.   Electronically Signed   By: Jeronimo Greaves   On: 08/27/2013 15:32    Assessment/Plan: Postop day #2: The patient is doing well. We will continue to mobilize her with PT and OT. We will discontinue her Foley, PCA, and Hep-Lock her IV. She will likely go home and next day or 2. I told her I was going to be out of town and have given her her discharge instructions. I have answered all  her questions.  LOS: 2 days     Shylie Polo D 08/29/2013, 8:45 AM

## 2013-08-30 NOTE — Progress Notes (Signed)
Physical Therapy Treatment Patient Details Name: [name] [name] MRN: [id number] DOB: 02-Oct-1941 Today's Date: 08/30/2013 Time: 4098-1191 PT Time Calculation (min): 24 min  PT Assessment / Plan / Recommendation  History of Present Illness 72 y.o. s/p Exploration of lumbar fusion/removal of all lumbar hardware; L2 and L3 laminectomy with bilateral L1 laminotomies to decompress the bilateral L2, L3 and L4 nerve roots(the work required to do this was in addition to the work required to do the posterior lumbar interbody fusion because of the patient's spinal stenosis, facet arthropathy. Etc. requiring a wide decompression of the nerve roots.); L1-2, L2-3 and L3-4 posterior lumbar interbody fusion with local morselized autograft bone and Actifusebone graft extender; insertion of interbody prosthesis at L1-2, L2-3 and L3-4 (globus peek interbody prosthesis); posterior mental instrumentation from L1 to S1 with globus titanium pedicle screws and rods; posterior lateral arthrodesis at L1-2, L2-3 and L3-4 with local morselized autograft bone and Vitoss bone graft extender.   PT Comments   Pt limited today by incr pain. Reviewed limiting her time sitting and how to maintain back precautions while mobilizing (pt requiring min cues).   Follow Up Recommendations  Home health PT;Supervision/Assistance - 24 hour     Does the patient have the potential to tolerate intense rehabilitation     Barriers to Discharge        Equipment Recommendations  Rolling walker with 5" wheels    Recommendations for Other Services    Frequency Min 5X/week   Progress towards PT Goals Progress towards PT goals: Progressing toward goals  Plan Current plan remains appropriate    Precautions / Restrictions Precautions Precautions: Back;Fall Precaution Comments: pt able to verbalize 3/3 back precautions; required cues x3 to avoid twisting during session Required Braces or Orthoses: Spinal Brace Spinal Brace: Lumbar corset;Applied  in sitting position Restrictions Weight Bearing Restrictions: No   Pertinent Vitals/Pain 9/10 back pain; patient repositioned for comfort; RN notified of need for pain meds/muscle relaxers     Mobility  Bed Mobility Bed Mobility: Rolling Left;Left Sidelying to Sit;Sitting - Scoot to [place] of Bed;Sit to Sidelying Left Rolling Right: 5: Supervision Rolling Left: 4: Min assist Left Sidelying to Sit: 4: Min guard;HOB flat Sitting - Scoot to Edge of Bed: 4: Min guard Sit to Sidelying Left: 4: Min assist;HOB flat Details for Bed Mobility Assistance: Educated pt on Lt side to sit--making a fist with Lt hand with Lt elbow pushing into mattress, placing Rt hand over Lt fist and pushing down to initiate lifting torso off bed; also educated she could place a tall back chair (with heavy object in seat to prevent tipping) next to her bed to simulate a bed rail and a place to hold onto as she steps up on her step stool; assist to raise legs onto bed; Transfers Transfers: Sit to Stand;Stand to Sit Sit to Stand: 4: Min guard;With upper extremity assist;From bed;From chair/3-in-1 Stand to Sit: 4: Min guard;With upper extremity assist;To chair/3-in-1 Details for Transfer Assistance: Cues for hand placement. Ambulation/Gait Ambulation/Gait Assistance: 4: Min guard Ambulation Distance (Feet): 125 Feet Assistive device: Rolling walker Ambulation/Gait Assistance Details: vc for upright posture and keeping RW closer to her to maintain back precautions Gait Pattern: Step-through pattern;Decreased stride length;Trunk flexed;Wide base of support Stairs: No (pt deferred due to incr pain; able to verbalize technique)    Exercises     PT Diagnosis:    PT Problem List:   PT Treatment Interventions:     PT Goals (current goals can now  be found in the care plan section)    Visit Information  Last PT Received On: 08/30/13 Assistance Needed: +1 History of Present Illness: 72 y.o. s/p Exploration of lumbar  fusion/removal of all lumbar hardware; L2 and L3 laminectomy with bilateral L1 laminotomies to decompress the bilateral L2, L3 and L4 nerve roots(the work required to do this was in addition to the work required to do the posterior lumbar interbody fusion because of the patient's spinal stenosis, facet arthropathy. Etc. requiring a wide decompression of the nerve roots.); L1-2, L2-3 and L3-4 posterior lumbar interbody fusion with local morselized autograft bone and Actifusebone graft extender; insertion of interbody prosthesis at L1-2, L2-3 and L3-4 (globus peek interbody prosthesis); posterior mental instrumentation from L1 to S1 with globus titanium pedicle screws and rods; posterior lateral arthrodesis at L1-2, L2-3 and L3-4 with local morselized autograft bone and Vitoss bone graft extender.    Subjective Data  Subjective: Reports she is hurting more today. Thinks she may have overdone it yesterday   Cognition  Cognition Arousal/Alertness: Lethargic Behavior During Therapy: WFL for tasks assessed/performed Overall Cognitive Status: Within Functional Limits for tasks assessed    Balance     End of Session PT - End of Session Equipment Utilized During Treatment: Gait belt;Back brace Activity Tolerance: Patient limited by pain Patient left: in bed;with call bell/phone within reach;with family/visitor present Nurse Communication: Mobility status;Patient requests pain meds   GP     [name] [name] 08/30/2013, 12:13 PM Pager [phone]

## 2013-08-30 NOTE — Progress Notes (Signed)
Occupational Therapy Treatment Patient Details Name: [name] [name] MRN: [id number] DOB: 20-Sep-1941 Today's Date: 08/30/2013 Time: 9147-8295 OT Time Calculation (min): 35 min  OT Assessment / Plan / Recommendation  History of present illness 72 yo female s/p PLIF L1-4    OT comments  Pt with no further acute Ot questions and all education complete. Pt is at adequate level for d/c home. Pt plans to use AE kit for LB dressing/ bathing.    Follow Up Recommendations  No OT follow up;Supervision/Assistance - 24 hour    Barriers to Discharge       Equipment Recommendations   (Ae kit)    Recommendations for Other Services    Frequency Min 2X/week   Progress towards OT Goals Progress towards OT goals: Progressing toward goals  Plan Discharge plan remains appropriate    Precautions / Restrictions Precautions Precautions: Back;Fall Precaution Comments: pt able to verbalize 3/3 back precautions; required cues x3 to avoid twisting during session Required Braces or Orthoses: Spinal Brace Spinal Brace: Lumbar corset;Applied in sitting position   Pertinent Vitals/Pain 3 out 10 minimal pain Reports medication change is much improved    ADL  Grooming: Wash/dry hands;Supervision/safety Where Assessed - Grooming: Unsupported standing Upper Body Dressing: Modified independent Where Assessed - Upper Body Dressing: Unsupported sitting (don brace- min cue for checking brace position) Lower Body Dressing:  (plans to use AE) Toilet Transfer: Radiographer, therapeutic Method: Sit to Barista: Raised toilet seat with arms (or 3-in-1 over toilet) Toileting - Clothing Manipulation and Hygiene: Supervision/safety Where Assessed - Engineer, mining and Hygiene: Sit to stand from 3-in-1 or toilet Tub/Shower Transfer: Min guard Tub/Shower Transfer Method: Science writer: Shower seat without back Equipment Used: Gait belt;Back  brace;Rolling walker Transfers/Ambulation Related to ADLs: pt ambulating with RW Supervision level ADL Comments: pt completed bed mobility with min v/c for sequence. Pt with good return demo. Pt done brace and verbalized back precautions. Pt completed tub and toilet transfers . Pt with all acute education complete    OT Diagnosis:    OT Problem List:   OT Treatment Interventions:     OT Goals(current goals can now be found in the care plan section) Acute Rehab OT Goals Patient Stated Goal: not stated OT Goal Formulation: With patient Time For Goal Achievement: 09/04/13 Potential to Achieve Goals: Good ADL Goals Pt Will Perform Grooming: standing;with set-up;with supervision Pt Will Perform Lower Body Bathing: with set-up;with supervision;with adaptive equipment;sit to/from stand Pt Will Perform Lower Body Dressing: with set-up;with supervision;with adaptive equipment;sit to/from stand Pt Will Transfer to Toilet: with modified independence;ambulating Pt Will Perform Toileting - Clothing Manipulation and hygiene: with modified independence;with adaptive equipment;sit to/from stand Pt Will Perform Tub/Shower Transfer: Tub transfer;with supervision;ambulating;rolling walker;shower seat Additional ADL Goal #1: Pt/caregiver will be independent in donning/doffing back brace while maintaining precautions. Additional ADL Goal #2: Pt will independently verbalize and demonstrate 3/3 back precautions.   Visit Information  Last OT Received On: 08/30/13 Assistance Needed: +1 History of Present Illness: 72 yo female s/p PLIF L1-4     Subjective Data      Prior Functioning       Cognition  Cognition Arousal/Alertness: Awake/alert Behavior During Therapy: WFL for tasks assessed/performed Overall Cognitive Status: Within Functional Limits for tasks assessed    Mobility  Bed Mobility Bed Mobility: Rolling Left;Left Sidelying to Sit;Sitting - Scoot to Edge of Bed;Sit to Sidelying Left Rolling  Right: 5: Supervision Rolling Left: 6: Modified independent (Device/Increase time)  Left Sidelying to Sit: HOB flat;5: Supervision Sitting - Scoot to Edge of Bed: 5: Supervision Sit to Sidelying Left: 5: Supervision;HOB flat Details for Bed Mobility Assistance: educated on sequence and with good return demo Transfers Transfers: Sit to Stand;Stand to Sit Sit to Stand: 5: Supervision;With upper extremity assist;Other (comment) (cues not to pull on RW) Stand to Sit: 5: Supervision;With upper extremity assist;To chair/3-in-1 Details for Transfer Assistance: cues for hand placement and safety with RW    Exercises      Balance     End of Session OT - End of Session Activity Tolerance: Patient tolerated treatment well Patient left: in bed;with call bell/phone within reach Nurse Communication: Mobility status;Precautions  GO     Harolyn Rutherford 08/30/2013, 2:45 PM Pager: [phone]

## 2013-08-30 NOTE — Progress Notes (Signed)
Patient ID: [name] [name], female   DOB: Jul 18, 1941, 72 y.o.   MRN: [id number] C/o incisional pain, no weakness. Pt helping

## 2013-08-31 MED ORDER — FLEET ENEMA 7-19 GM/118ML RE ENEM: 1.0000 | ENEMA | Freq: Every day | RECTAL | Status: DC | PRN

## 2013-08-31 MED ORDER — SENNA 8.6 MG PO TABS
1.0000 | ORAL_TABLET | Freq: Every evening | ORAL | Status: DC | PRN
  Administered 2013-08-31: 8.6 mg via ORAL
  Filled 2013-08-31: qty 1

## 2013-08-31 MED ORDER — BISACODYL 10 MG RE SUPP: 10.0000 mg | Freq: Every day | RECTAL | Status: DC | PRN

## 2013-08-31 MED ORDER — HYDROCORTISONE 1 % EX CREA
TOPICAL_CREAM | Freq: Three times a day (TID) | CUTANEOUS | Status: DC
  Administered 2013-08-31 – 2013-09-01 (×2): via TOPICAL
  Filled 2013-08-31: qty 28

## 2013-08-31 MED ORDER — PROSIGHT PO TABS
1.0000 | ORAL_TABLET | Freq: Every day | ORAL | Status: DC
  Filled 2013-08-31: qty 1

## 2013-08-31 NOTE — Progress Notes (Signed)
Patient ID: [name] [name], female   DOB: 04-13-1941, 72 y.o.   MRN: [id number] Stable, decrease of pain. No weakness

## 2013-08-31 NOTE — Progress Notes (Signed)
Physical Therapy Treatment Patient Details Name: [name] [name] MRN: [id number] DOB: 1941/06/01 Today's Date: 08/31/2013 Time: 4098-1191 PT Time Calculation (min): 27 min  PT Assessment / Plan / Recommendation  History of Present Illness 72 yo female s/p PLIF L1-4    PT Comments   Pt mobilizing well and ready for d/c home with family's assist. She is concerned re: pain control issues and instructed to discuss with her MD.  Follow Up Recommendations  Home health PT;Supervision/Assistance - 24 hour     Does the patient have the potential to tolerate intense rehabilitation     Barriers to Discharge        Equipment Recommendations  Rolling walker with 5" wheels    Recommendations for Other Services    Frequency Min 5X/week   Progress towards PT Goals Progress towards PT goals: Progressing toward goals  Plan Current plan remains appropriate    Precautions / Restrictions Precautions Precautions: Back;Fall Required Braces or Orthoses: Spinal Brace Spinal Brace: Lumbar corset;Applied in sitting position   Pertinent Vitals/Pain 8/10 low back; patient repositioned for comfort; The patient is advised to apply ice or cold packs intermittently as needed to relieve pain    Mobility  Bed Mobility Bed Mobility: Rolling Left;Sitting - Scoot to Edge of Bed;Sit to Sidelying Left;Rolling Right Rolling Right: 5: Supervision Rolling Left: 5: Supervision Sit to Sidelying Left: 4: Min assist;HOB flat Details for Bed Mobility Assistance: Pt required cues for keeping knees bent while rolling and to avoid twisting; sit to side, she continues to prematurely rotate/twist to lie on her back and then try to pull legs up; assist with correct technique to go fully down on her side as she brings her legs up and required min assist, but admitted this was better on her back Transfers Transfers: Sit to Stand;Stand to Sit Sit to Stand: 5: Supervision;With upper extremity assist;Other (comment) Stand to Sit: 5:  Supervision;With upper extremity assist;To chair/3-in-1 Ambulation/Gait Ambulation/Gait Assistance: 5: Supervision Ambulation Distance (Feet): 350 Feet Assistive device: Rolling walker Ambulation/Gait Assistance Details: vc for upright posture and keeping RW closer to her to maintain back precautions Gait Pattern: Step-through pattern;Decreased stride length;Trunk flexed;Wide base of support;Decreased dorsiflexion - left Stairs: Yes Stairs Assistance: 4: Min guard Stairs Assistance Details (indicate cue type and reason): Pt practiced forwards and sideways and preferred sideways. Required cues to step further out to the side with lead leg to allow room for trailing foot on the same step Stair Management Technique: One rail Right;Step to pattern;Sideways;Forwards Number of Stairs: 4    Exercises     PT Diagnosis:    PT Problem List:   PT Treatment Interventions:     PT Goals (current goals can now be found in the care plan section)    Visit Information  Last PT Received On: 08/31/13 Assistance Needed: +1 History of Present Illness: 72 yo female s/p PLIF L1-4     Subjective Data  Subjective: Feeling slightly better. Not sure if she feels ready to go home, mostly due to pain control issues   Cognition  Cognition Arousal/Alertness: Awake/alert Behavior During Therapy: WFL for tasks assessed/performed Overall Cognitive Status: Within Functional Limits for tasks assessed    Balance     End of Session PT - End of Session Equipment Utilized During Treatment: Back brace Activity Tolerance: Patient limited by pain;Patient tolerated treatment well Patient left: in bed;with call bell/phone within reach;Other (comment) (ice pack to back)   GP     Geniva Lohnes 08/31/2013, 9:32 AM Pager  [phone]   

## 2013-08-31 NOTE — Progress Notes (Signed)
CM CONSULT Talked to patient with spouse present about discharge planning, patient is agreeable to home health physical therapy at discharge. Patient requested [facility]; Mary with [facility] called and made aware of service; Attending MD, if in agreement with home health PT, please order HHPT/ rolling walker with 5" wheels at discharge; Alexis Goodell [phone]

## 2013-08-31 NOTE — Anesthesia Postprocedure Evaluation (Signed)
  Anesthesia Post-op Note  Patient: [name] [name]  Procedure(s) Performed: Procedure(s): Lumbar one-two, two-three, three-four laminectomies with posterior lumbar interbody fusion with interbody prothesis posterior lateral arthrodesis and posterior segmental instrumentation and exporation of fusion and  removal of old instrumentation (N/A)  Patient Location: PACU and Nursing Unit  Anesthesia Type:General  Level of Consciousness: awake, alert , oriented and patient cooperative  Airway and Oxygen Therapy: Patient Spontanous Breathing  Post-op Pain: mild  Post-op Assessment: Post-op Vital signs reviewed, Patient's Cardiovascular Status Stable, Respiratory Function Stable, Patent Airway, No signs of Nausea or vomiting, Adequate PO intake and Pain level controlled  Post-op Vital Signs: Reviewed and stable  Complications: No apparent anesthesia complications

## 2013-08-31 NOTE — Progress Notes (Signed)
Pt took a shower and we removed the dressing, found out she has some blisters and redness around the incision which looks like allergic reaction. Dr.Botero came and have a look at the site, said she is allergic to stri-strips removed the stripes and applied cortisone cream around the site. Ordered Cortisone cream tid.  [name]

## 2013-08-31 NOTE — Progress Notes (Signed)
Pt ambulated in the hallway- one full round, with RN. Tolerated very well with brace and walker

## 2013-09-01 NOTE — Discharge Summary (Signed)
Physician Discharge Summary  Patient ID: [name] [name] MRN: [id number] DOB/AGE: Oct 17, 1941 72 y.o.  Admit date: 08/27/2013 Discharge date: 09/01/2013  Admission Diagnoses:lumbar degenerative disc disease  Discharge Diagnoses: same Active Problems:   * No active hospital problems. *   Discharged Condition: stable. ambulating  Hospital Course: surgery  Consults: none  Significant Diagnostic Studies: mri  Treatments: lumbar fusion  Discharge Exam: Blood pressure 118/52, pulse 75, temperature 98.8 F (37.1 C), temperature source Oral, resp. rate 18, height 5\' 5"  (1.651 m), weight 100.2 kg (220 lb 14.4 oz), SpO2 93.00%. Skin reaction to tape in lumbar area. No weakness  Disposition: home. sha has pain medications at home   Future Appointments Provider Department Dept Phone   08/16/2014 1:00 PM Annamaria Boots, MD [facility] [phone]       Medication List    ASK your doctor about these medications       aspirin EC 81 MG tablet  Take 81 mg by mouth daily.     CALCIUM 1200 PO  Take by mouth.     diazepam 2 MG tablet  Commonly known as:  VALIUM  Take 2 mg by mouth 2 (two) times daily as needed (for muscle spasms).     EPINEPHrine 0.3 mg/0.3 mL Devi  Commonly known as:  EPI-PEN  Inject 0.3 mg into the muscle once. For various allergic reactions.     fexofenadine 180 MG tablet  Commonly known as:  ALLEGRA  Take 180 mg by mouth daily.     FLAX SEED OIL PO  Take 1 capsule by mouth 2 (two) times daily.     fluticasone 50 MCG/ACT nasal spray  Commonly known as:  FLONASE  Place 2 sprays into the nose daily as needed (for congestion).     furosemide 40 MG tablet  Commonly known as:  LASIX  Take 40 mg by mouth daily.     gabapentin 600 MG tablet  Commonly known as:  NEURONTIN  Take 600 mg by mouth 3 (three) times daily.     levothyroxine 75 MCG tablet  Commonly known as:  SYNTHROID, LEVOTHROID  Take 75 mcg by mouth daily.     loratadine  10 MG tablet  Commonly known as:  CLARITIN  Take 10 mg by mouth daily.     methadone 5 MG tablet  Commonly known as:  DOLOPHINE  Take 5 mg by mouth QID.     metoprolol succinate 25 MG 24 hr tablet  Commonly known as:  TOPROL-XL  Take 25 mg by mouth daily before breakfast.     multivitamin Liqd  Take 5 mLs by mouth daily.     multivitamin with minerals Tabs tablet  Take 1 tablet by mouth daily.     nabumetone 750 MG tablet  Commonly known as:  RELAFEN  Take 750 mg by mouth 2 (two) times daily.     omeprazole 20 MG capsule  Commonly known as:  PRILOSEC  Take 20 mg by mouth at bedtime.     oxyCODONE 5 MG immediate release tablet  Commonly known as:  Oxy IR/ROXICODONE  Take 5 mg by mouth every 4 (four) hours as needed for pain.     pravastatin 20 MG tablet  Commonly known as:  PRAVACHOL  Take 20 mg by mouth at bedtime.     REFRESH DRY EYE THERAPY OP  Place 1 drop into both eyes every 6 (six) hours as needed. Dry eyes .     senna 8.6  MG tablet  Commonly known as:  SENOKOT  Take 2 tablets by mouth 2 (two) times daily.     VITAMIN C PO  Take by mouth.     zolpidem 12.5 MG CR tablet  Commonly known as:  AMBIEN CR  Take 12.5 mg by mouth at bedtime.         Signed: Karn Cassis 09/01/2013, 9:24 AM

## 2013-09-01 NOTE — Progress Notes (Signed)
   CARE MANAGEMENT NOTE 09/01/2013  Patient:  [name] [name], [name] [name]   Account Number:  [id number]  Date Initiated:  08/31/2013  Documentation initiated by:  Jiles Crocker  Subjective/Objective Assessment:   ADMITTED FOR SURGERY -Exploration of lumbar fusion/removal of all lumbar hardware     Action/Plan:   CM FOLLOWING FOR DCP   Anticipated DC Date:  09/01/2013   Anticipated DC Plan:  HOME W HOME HEALTH SERVICES      DC Planning Services  CM consult      Choice offered to / List presented to:  C-1 Patient        HH arranged  HH-2 PT      [facility] agency  [facility]   Status of service:  Completed, signed off Medicare Important Message given?   (If response is "NO", the following Medicare IM given date fields will be blank) Date Medicare IM given:   Date Additional Medicare IM given:    Discharge Disposition:  HOME W HOME HEALTH SERVICES  Per UR Regulation:  Reviewed for med. necessity/level of care/duration of stay  If discussed at Long Length of Stay Meetings, dates discussed:    Comments:  927/14 11:30 CM spoke with pt who stated she has a rolling walker at home from a previous knee surgery.  [name] [name] was called to inform of pt discharge.  No other CM needs were communicated.  [name] [name], BSN, [place] [phone].  08/31/2013- [name] Derrick RN,BSN,MHA Talked to patient with spouse present about discharge planning, patient is agreeable to home health physical therapy at discharge. Patient requested [facility]; Mary with [name] [name] called and made aware of service; Attending MD, if in agreement with home health PT, please order HHPT/ rolling walker with 5" wheels at discharge; [name] [name] [phone]

## 2013-09-01 NOTE — Progress Notes (Signed)
Physical Therapy Treatment Patient Details Name: [name] [name] MRN: [id number] DOB: Apr 14, 1941 Today's Date: 09/01/2013 Time: 4098-1191 PT Time Calculation (min): 19 min  PT Assessment / Plan / Recommendation  History of Present Illness 72 yo female s/p PLIF L1-4    PT Comments   Pt progressing well towards physical therapy goals. Pt continues to require cueing to maintain back precautions, and seemed off balance at times during gait training. 24 hour supervision would be best upon d/c home, with HHPT to follow.  Follow Up Recommendations  Home health PT;Supervision/Assistance - 24 hour     Does the patient have the potential to tolerate intense rehabilitation     Barriers to Discharge        Equipment Recommendations  Rolling walker with 5" wheels    Recommendations for Other Services    Frequency Min 5X/week   Progress towards PT Goals Progress towards PT goals: Progressing toward goals  Plan Current plan remains appropriate    Precautions / Restrictions Precautions Precautions: Back;Fall Required Braces or Orthoses: Spinal Brace Spinal Brace: Lumbar corset;Applied in sitting position Restrictions Weight Bearing Restrictions: No   Pertinent Vitals/Pain Pt reports 8.5/10 pain at beginning of session, pt states she received pain medication prior to session beginning.    Mobility  Bed Mobility Bed Mobility: Rolling Left;Left Sidelying to Sit;Sitting - Scoot to Edge of Bed Rolling Left: 5: Supervision Left Sidelying to Sit: 5: Supervision;HOB flat Sitting - Scoot to Edge of Bed: 5: Supervision Details for Bed Mobility Assistance: Frequent cues for sequencing and technique to maintain back precautions while transfering to EOB. Transfers Transfers: Sit to Stand;Stand to Sit Sit to Stand: 5: Supervision;With upper extremity assist;From bed Stand to Sit: 5: Supervision;With upper extremity assist;To chair/3-in-1 Details for Transfer Assistance: Frequent VC's to maintain back  precautions, as pt bends back almost immediately when initiating transfers Ambulation/Gait Ambulation/Gait Assistance: 5: Supervision;4: Min guard (occasionally) Ambulation Distance (Feet): 100 Feet Assistive device: Rolling walker Ambulation/Gait Assistance Details: VC's to maintain back precautions. RW height increased to promote improved posture. 4 instances where pt almost lost balance because walker got away from her during turns/direction changes. Min guard for those times, however supervision for the rest of gait training was appropriate. Gait Pattern: Step-through pattern;Decreased stride length Gait velocity: Decreased Stairs: No    Exercises     PT Diagnosis:    PT Problem List:   PT Treatment Interventions:     PT Goals (current goals can now be found in the care plan section) Acute Rehab PT Goals Patient Stated Goal: To return home PT Goal Formulation: With patient Time For Goal Achievement: 09/04/13 Potential to Achieve Goals: Good  Visit Information  Last PT Received On: 09/01/13 Assistance Needed: +1 History of Present Illness: 72 yo female s/p PLIF L1-4     Subjective Data  Subjective: Pt expressed increased pain in LB, however states that she feels fine otherwise. Patient Stated Goal: To return home   Cognition  Cognition Arousal/Alertness: Awake/alert Behavior During Therapy: WFL for tasks assessed/performed Overall Cognitive Status: Within Functional Limits for tasks assessed    Balance  Balance Balance Assessed: Yes Dynamic Standing Balance Dynamic Standing - Balance Support: Bilateral upper extremity supported;During functional activity (ambulation) Dynamic Standing - Level of Assistance: 5: Stand by assistance (pt off balance during turns/direction changes - Min G needed)  End of Session PT - End of Session Equipment Utilized During Treatment: Back brace Activity Tolerance: Patient tolerated treatment well;Patient limited by pain Patient left: in  bed;with call bell/phone within reach Nurse Communication: Mobility status;Precautions   GP     Ruthann Cancer 09/01/2013, 11:03 AM  Ruthann Cancer, PT, DPT Acute Rehabilitation Services

## 2013-09-01 NOTE — Progress Notes (Signed)
Pt is ready to go home today, d/c educations given, pt took a shower and will role her down in a wheel chair.

## 2013-09-02 DIAGNOSIS — J441 Chronic obstructive pulmonary disease with (acute) exacerbation: Secondary | ICD-10-CM | POA: Diagnosis not present

## 2013-09-02 DIAGNOSIS — M4800 Spinal stenosis, site unspecified: Secondary | ICD-10-CM | POA: Diagnosis not present

## 2013-09-02 DIAGNOSIS — Z4789 Encounter for other orthopedic aftercare: Secondary | ICD-10-CM | POA: Diagnosis not present

## 2013-09-02 DIAGNOSIS — M545 Low back pain: Secondary | ICD-10-CM | POA: Diagnosis not present

## 2013-09-02 DIAGNOSIS — I10 Essential (primary) hypertension: Secondary | ICD-10-CM | POA: Diagnosis not present

## 2013-09-06 DIAGNOSIS — M4800 Spinal stenosis, site unspecified: Secondary | ICD-10-CM | POA: Diagnosis not present

## 2013-09-06 DIAGNOSIS — M545 Low back pain: Secondary | ICD-10-CM | POA: Diagnosis not present

## 2013-09-06 DIAGNOSIS — Z4789 Encounter for other orthopedic aftercare: Secondary | ICD-10-CM | POA: Diagnosis not present

## 2013-09-06 DIAGNOSIS — I10 Essential (primary) hypertension: Secondary | ICD-10-CM | POA: Diagnosis not present

## 2013-09-06 DIAGNOSIS — J441 Chronic obstructive pulmonary disease with (acute) exacerbation: Secondary | ICD-10-CM | POA: Diagnosis not present

## 2013-09-10 DIAGNOSIS — Z79899 Other long term (current) drug therapy: Secondary | ICD-10-CM | POA: Diagnosis not present

## 2013-09-10 DIAGNOSIS — M47817 Spondylosis without myelopathy or radiculopathy, lumbosacral region: Secondary | ICD-10-CM | POA: Diagnosis not present

## 2013-09-10 DIAGNOSIS — IMO0002 Reserved for concepts with insufficient information to code with codable children: Secondary | ICD-10-CM | POA: Diagnosis not present

## 2013-09-10 DIAGNOSIS — M19019 Primary osteoarthritis, unspecified shoulder: Secondary | ICD-10-CM | POA: Diagnosis not present

## 2013-09-11 DIAGNOSIS — M545 Low back pain: Secondary | ICD-10-CM | POA: Diagnosis not present

## 2013-09-13 DIAGNOSIS — J441 Chronic obstructive pulmonary disease with (acute) exacerbation: Secondary | ICD-10-CM | POA: Diagnosis not present

## 2013-09-13 DIAGNOSIS — M4800 Spinal stenosis, site unspecified: Secondary | ICD-10-CM | POA: Diagnosis not present

## 2013-09-13 DIAGNOSIS — I10 Essential (primary) hypertension: Secondary | ICD-10-CM | POA: Diagnosis not present

## 2013-09-13 DIAGNOSIS — M545 Low back pain: Secondary | ICD-10-CM | POA: Diagnosis not present

## 2013-09-13 DIAGNOSIS — Z4789 Encounter for other orthopedic aftercare: Secondary | ICD-10-CM | POA: Diagnosis not present

## 2013-09-18 DIAGNOSIS — J441 Chronic obstructive pulmonary disease with (acute) exacerbation: Secondary | ICD-10-CM | POA: Diagnosis not present

## 2013-09-18 DIAGNOSIS — M4800 Spinal stenosis, site unspecified: Secondary | ICD-10-CM | POA: Diagnosis not present

## 2013-09-18 DIAGNOSIS — M545 Low back pain: Secondary | ICD-10-CM | POA: Diagnosis not present

## 2013-09-18 DIAGNOSIS — I10 Essential (primary) hypertension: Secondary | ICD-10-CM | POA: Diagnosis not present

## 2013-09-18 DIAGNOSIS — Z4789 Encounter for other orthopedic aftercare: Secondary | ICD-10-CM | POA: Diagnosis not present

## 2013-09-20 DIAGNOSIS — I10 Essential (primary) hypertension: Secondary | ICD-10-CM | POA: Diagnosis not present

## 2013-09-20 DIAGNOSIS — M545 Low back pain: Secondary | ICD-10-CM | POA: Diagnosis not present

## 2013-09-20 DIAGNOSIS — J441 Chronic obstructive pulmonary disease with (acute) exacerbation: Secondary | ICD-10-CM | POA: Diagnosis not present

## 2013-09-20 DIAGNOSIS — M4800 Spinal stenosis, site unspecified: Secondary | ICD-10-CM | POA: Diagnosis not present

## 2013-09-20 DIAGNOSIS — M47817 Spondylosis without myelopathy or radiculopathy, lumbosacral region: Secondary | ICD-10-CM | POA: Diagnosis not present

## 2013-09-20 DIAGNOSIS — Z4789 Encounter for other orthopedic aftercare: Secondary | ICD-10-CM | POA: Diagnosis not present

## 2013-09-20 DIAGNOSIS — G8928 Other chronic postprocedural pain: Secondary | ICD-10-CM | POA: Diagnosis not present

## 2013-09-21 IMAGING — CT CT L SPINE W/ CM
3 of 9 series · 8 of 20 positions shown, 9 images · IV contrast (omnipaque)
Comparison: MR 05/09/2013 and earlier studies

Clincal data: Previous lumbar fusion.  Worsening low back and right
lower extremity pain.

LUMBAR MYELOGRAM
CT LUMBAR SPINE WITH INTRATHECAL CONTRAST
TECHNIQUE: An appropriate entry site was determined under
fluoroscopy. Operator donned sterile gloves and mask. Skin site was
marked, prepped with Betadine, and draped in usual sterile fashion,
and infiltrated locally with 1% lidocaine. A 22 gauge spinal needle
was  advanced into the thecal sac at L4-5 from a left parasagittal
approach. Clear colorless CSF returned. 17 ml Omnipaque 180 were
administered intrathecally for lumbar myelography, followed by
axial CT scanning of the lumbar spine. I personally performed the
lumbar puncture and administered the intrathecal contrast. I also
personally supervised acquisition of the myelogram images. Coronal
and sagittal reconstructions were generated from the axial scan.
Fluoroscopy time: 17seconds

[Series 2: l spine bone · axial · 0.27mm/px · z∈[-185,-85]mm · 3 of 81 slices shown, 4 images]
[im 21/81  soft-tissue]
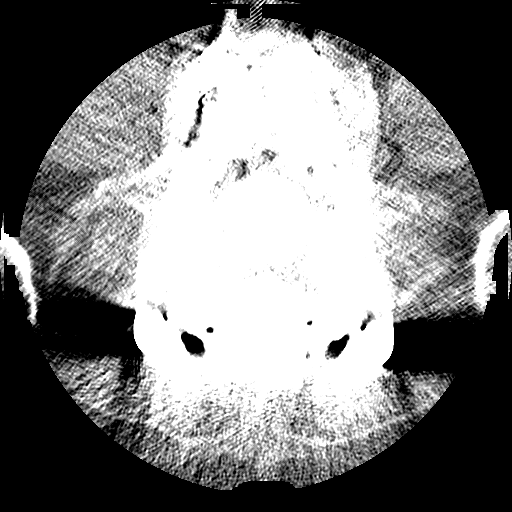
[im 21/81  bone]
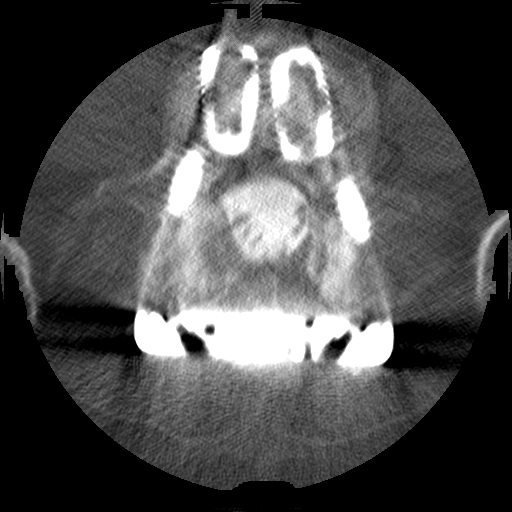
[im 41/81  bone]
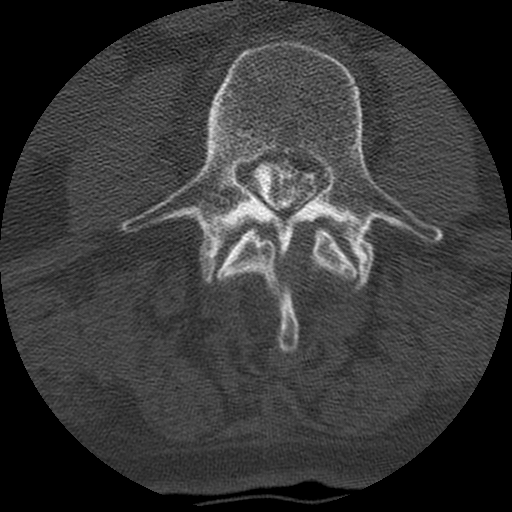
[im 61/81  bone]
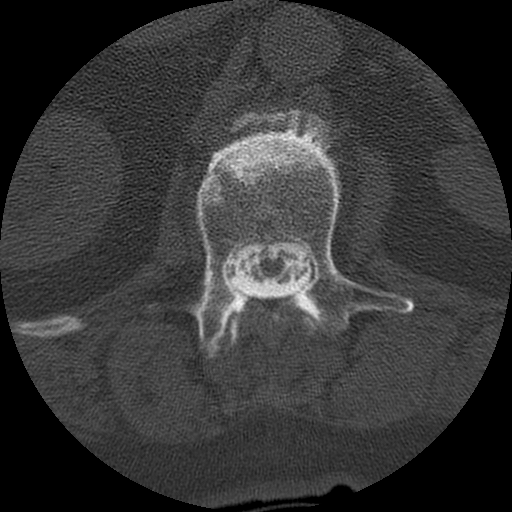

[Series 3: l spine soft · axial · 0.27mm/px · z∈[-185,-85]mm · 3 of 81 slices shown]
[im 21/81  soft-tissue]
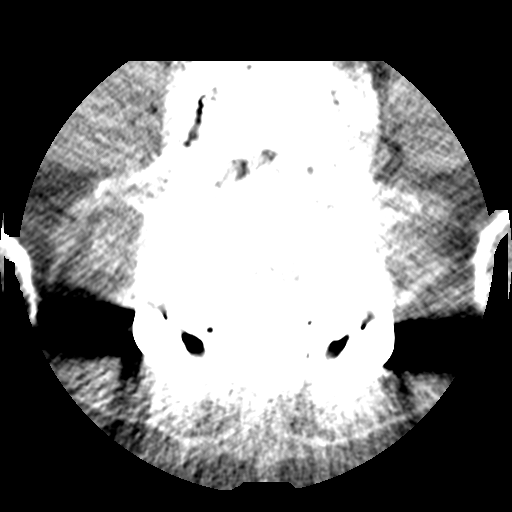
[im 41/81  soft-tissue]
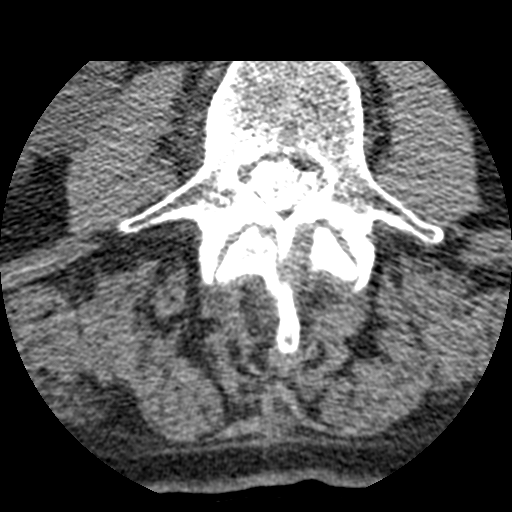
[im 61/81  soft-tissue]
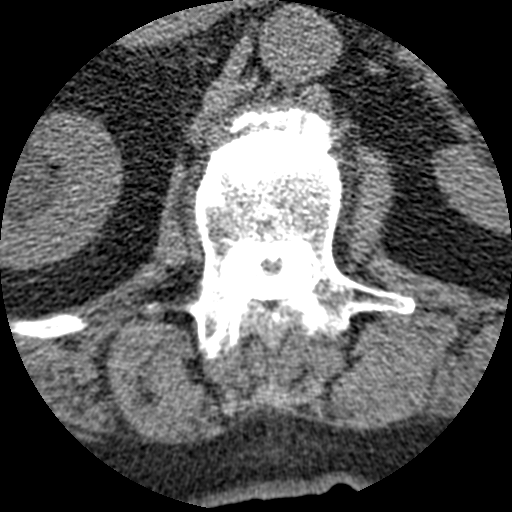

[Series 105: sag · sagittal · 0.40mm/px · 2 of 53 slices shown]
[im 18/53  bone]
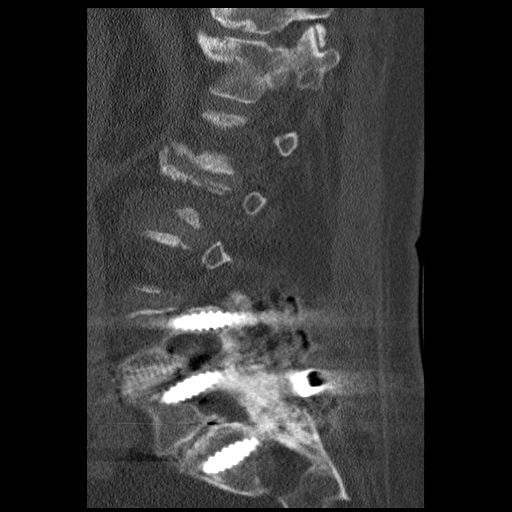
[im 35/53  bone]
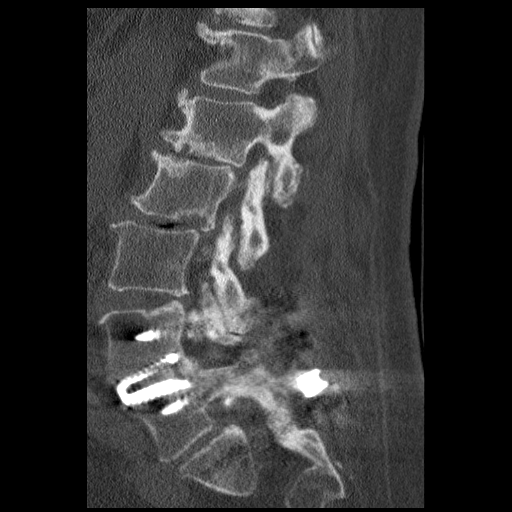

[8 of 20 positions shown; findings below may reference images not displayed]

FINDINGS: Five non-rib bearing lumbar segments labeled L1-L5 as
before.  Mild lumbar levoscoliosis apex L3.  Negative for fracture.

T11-12:  Early vacuum phenomenon in the interspace anteriorly.
Mild disc bulge.  Anterior endplate spurring.  Central canal and
foramina patent.

T12-L1:  Mild disc bulge and bilateral facet degenerative
hypertrophy.  Central canal and foramina remain widely patent.

L1-2: Moderate narrowing of the interspace with vacuum phenomenon,
small Schmorl's nodes in the adjacent endplates, and adjacent
discogenic sclerosis in the vertebral bodies.  4.5 mm
retrolisthesis without significant dynamic instability on standing
lateral flexion and extension radiographs.  Conus terminates at the
level of the interspace. Mild circumferential disc bulge with a
small left lateral protrusion extending down behind L2.  There is
moderate bilateral facet degenerative hypertrophy and some
thickening of ligamentum flavum contributing to mild central canal
stenosis and asymmetric foraminal encroachment, right worse than
left.

L2-3: Moderate narrowing of the interspace with vacuum phenomenon,
small Schmorl's nodes in the endplates, and mild adjacent
discogenic sclerosis in the vertebral bodies.  Mild circumferential
disc bulge.  Moderate bilateral facet degenerative hypertrophy and
some thickening of ligamentum flavum contribute to moderate spinal
stenosis.  There is approximately 6 mm retrolisthesis without
dynamic instability.  Bilateral foraminal stenosis, left greater
than right.

L3-4: Mild narrowing of the interspace.  Moderate circumferential
disc bulge.  Moderate bilateral facet degenerative hypertrophy and
some thickening of ligamentum flavum contribute to only mild
central canal stenosis and mild bilateral foraminal encroachment.

L4-5:  Bilateral pedicle screws at each level with vertical
interconnecting hardware, intact without surrounding lucency.
Cages project in the interspace with some subsidence with solid
appearing interbody fusion.  There has been posterior
decompression.  Thecal sac is capacious.

L5-S1: Continuation of posterior spinal fusion, bilateral S1
pedicle screws intact without surrounding lucency.  Right-sided
cage in the interspace with mild subsidence, solid appearing fusion
across the interspace at the level of the cage.  There is also
solid appearing fusion across posterior elements.  Central canal
and foramina patent.

Mild calcified plaque near the aortic bifurcation.  Mild right
hydronephrosis or parapelvic cysts, incompletely visualized.
Remainder paraspinal soft tissues unremarkable.
IMPRESSION: 1.  Mild multifactorial spinal stenosis L1-2 with  4.5 mm
retrolisthesis and a small superimposed left posterior protrusion.
No dynamic instability.
2.  Moderately severe multifactorial spinal stenosis L2-3 with 6 mm
retrolisthesis and bilateral foraminal stenosis. No dynamic
instability.  3.  Mild multifactorial spinal stenosis L3-4 with
early foraminal encroachment.
4.  Solid appearing instrumented fusion L4-5 and L5-S1 without
apparent complication.

## 2013-10-02 DIAGNOSIS — L905 Scar conditions and fibrosis of skin: Secondary | ICD-10-CM | POA: Diagnosis not present

## 2013-10-02 DIAGNOSIS — L259 Unspecified contact dermatitis, unspecified cause: Secondary | ICD-10-CM | POA: Diagnosis not present

## 2013-10-02 DIAGNOSIS — L821 Other seborrheic keratosis: Secondary | ICD-10-CM | POA: Diagnosis not present

## 2013-10-02 DIAGNOSIS — L509 Urticaria, unspecified: Secondary | ICD-10-CM | POA: Diagnosis not present

## 2013-10-03 DIAGNOSIS — Z23 Encounter for immunization: Secondary | ICD-10-CM | POA: Diagnosis not present

## 2013-10-05 DIAGNOSIS — H35059 Retinal neovascularization, unspecified, unspecified eye: Secondary | ICD-10-CM | POA: Diagnosis not present

## 2013-10-05 DIAGNOSIS — H353190 Nonexudative age-related macular degeneration, unspecified eye, stage unspecified: Secondary | ICD-10-CM | POA: Diagnosis not present

## 2013-10-05 DIAGNOSIS — H353290 Exudative age-related macular degeneration, unspecified eye, stage unspecified: Secondary | ICD-10-CM | POA: Diagnosis not present

## 2013-10-05 DIAGNOSIS — H43819 Vitreous degeneration, unspecified eye: Secondary | ICD-10-CM | POA: Diagnosis not present

## 2013-10-10 DIAGNOSIS — R279 Unspecified lack of coordination: Secondary | ICD-10-CM | POA: Diagnosis not present

## 2013-10-10 DIAGNOSIS — M545 Low back pain: Secondary | ICD-10-CM | POA: Diagnosis not present

## 2013-10-10 DIAGNOSIS — R262 Difficulty in walking, not elsewhere classified: Secondary | ICD-10-CM | POA: Diagnosis not present

## 2013-10-11 ENCOUNTER — Other Ambulatory Visit: Payer: Self-pay

## 2013-10-12 DIAGNOSIS — M545 Low back pain: Secondary | ICD-10-CM | POA: Diagnosis not present

## 2013-10-12 DIAGNOSIS — R262 Difficulty in walking, not elsewhere classified: Secondary | ICD-10-CM | POA: Diagnosis not present

## 2013-10-12 DIAGNOSIS — R279 Unspecified lack of coordination: Secondary | ICD-10-CM | POA: Diagnosis not present

## 2013-10-15 DIAGNOSIS — R262 Difficulty in walking, not elsewhere classified: Secondary | ICD-10-CM | POA: Diagnosis not present

## 2013-10-15 DIAGNOSIS — R279 Unspecified lack of coordination: Secondary | ICD-10-CM | POA: Diagnosis not present

## 2013-10-15 DIAGNOSIS — M545 Low back pain: Secondary | ICD-10-CM | POA: Diagnosis not present

## 2013-10-17 DIAGNOSIS — M545 Low back pain: Secondary | ICD-10-CM | POA: Diagnosis not present

## 2013-10-17 DIAGNOSIS — R279 Unspecified lack of coordination: Secondary | ICD-10-CM | POA: Diagnosis not present

## 2013-10-17 DIAGNOSIS — R262 Difficulty in walking, not elsewhere classified: Secondary | ICD-10-CM | POA: Diagnosis not present

## 2013-10-18 DIAGNOSIS — G894 Chronic pain syndrome: Secondary | ICD-10-CM | POA: Diagnosis not present

## 2013-10-18 DIAGNOSIS — M47817 Spondylosis without myelopathy or radiculopathy, lumbosacral region: Secondary | ICD-10-CM | POA: Diagnosis not present

## 2013-10-18 DIAGNOSIS — Z79899 Other long term (current) drug therapy: Secondary | ICD-10-CM | POA: Diagnosis not present

## 2013-10-19 DIAGNOSIS — M545 Low back pain: Secondary | ICD-10-CM | POA: Diagnosis not present

## 2013-10-19 DIAGNOSIS — R279 Unspecified lack of coordination: Secondary | ICD-10-CM | POA: Diagnosis not present

## 2013-10-19 DIAGNOSIS — R262 Difficulty in walking, not elsewhere classified: Secondary | ICD-10-CM | POA: Diagnosis not present

## 2013-10-23 DIAGNOSIS — R279 Unspecified lack of coordination: Secondary | ICD-10-CM | POA: Diagnosis not present

## 2013-10-23 DIAGNOSIS — M545 Low back pain: Secondary | ICD-10-CM | POA: Diagnosis not present

## 2013-10-23 DIAGNOSIS — R262 Difficulty in walking, not elsewhere classified: Secondary | ICD-10-CM | POA: Diagnosis not present

## 2013-10-24 DIAGNOSIS — M545 Low back pain: Secondary | ICD-10-CM | POA: Diagnosis not present

## 2013-10-24 DIAGNOSIS — R262 Difficulty in walking, not elsewhere classified: Secondary | ICD-10-CM | POA: Diagnosis not present

## 2013-10-24 DIAGNOSIS — R279 Unspecified lack of coordination: Secondary | ICD-10-CM | POA: Diagnosis not present

## 2013-10-26 DIAGNOSIS — R262 Difficulty in walking, not elsewhere classified: Secondary | ICD-10-CM | POA: Diagnosis not present

## 2013-10-26 DIAGNOSIS — M545 Low back pain: Secondary | ICD-10-CM | POA: Diagnosis not present

## 2013-10-26 DIAGNOSIS — R279 Unspecified lack of coordination: Secondary | ICD-10-CM | POA: Diagnosis not present

## 2013-10-28 DIAGNOSIS — R279 Unspecified lack of coordination: Secondary | ICD-10-CM | POA: Diagnosis not present

## 2013-10-28 DIAGNOSIS — R262 Difficulty in walking, not elsewhere classified: Secondary | ICD-10-CM | POA: Diagnosis not present

## 2013-10-28 DIAGNOSIS — M545 Low back pain: Secondary | ICD-10-CM | POA: Diagnosis not present

## 2013-10-29 DIAGNOSIS — R262 Difficulty in walking, not elsewhere classified: Secondary | ICD-10-CM | POA: Diagnosis not present

## 2013-10-29 DIAGNOSIS — M545 Low back pain: Secondary | ICD-10-CM | POA: Diagnosis not present

## 2013-10-29 DIAGNOSIS — R279 Unspecified lack of coordination: Secondary | ICD-10-CM | POA: Diagnosis not present

## 2013-10-31 DIAGNOSIS — M545 Low back pain: Secondary | ICD-10-CM | POA: Diagnosis not present

## 2013-10-31 DIAGNOSIS — R262 Difficulty in walking, not elsewhere classified: Secondary | ICD-10-CM | POA: Diagnosis not present

## 2013-10-31 DIAGNOSIS — R279 Unspecified lack of coordination: Secondary | ICD-10-CM | POA: Diagnosis not present

## 2013-11-05 DIAGNOSIS — R279 Unspecified lack of coordination: Secondary | ICD-10-CM | POA: Diagnosis not present

## 2013-11-05 DIAGNOSIS — M545 Low back pain: Secondary | ICD-10-CM | POA: Diagnosis not present

## 2013-11-05 DIAGNOSIS — R262 Difficulty in walking, not elsewhere classified: Secondary | ICD-10-CM | POA: Diagnosis not present

## 2013-11-07 DIAGNOSIS — R279 Unspecified lack of coordination: Secondary | ICD-10-CM | POA: Diagnosis not present

## 2013-11-07 DIAGNOSIS — M545 Low back pain: Secondary | ICD-10-CM | POA: Diagnosis not present

## 2013-11-07 DIAGNOSIS — R262 Difficulty in walking, not elsewhere classified: Secondary | ICD-10-CM | POA: Diagnosis not present

## 2013-11-09 DIAGNOSIS — I10 Essential (primary) hypertension: Secondary | ICD-10-CM | POA: Diagnosis not present

## 2013-11-09 DIAGNOSIS — R279 Unspecified lack of coordination: Secondary | ICD-10-CM | POA: Diagnosis not present

## 2013-11-09 DIAGNOSIS — E039 Hypothyroidism, unspecified: Secondary | ICD-10-CM | POA: Diagnosis not present

## 2013-11-09 DIAGNOSIS — E785 Hyperlipidemia, unspecified: Secondary | ICD-10-CM | POA: Diagnosis not present

## 2013-11-09 DIAGNOSIS — R262 Difficulty in walking, not elsewhere classified: Secondary | ICD-10-CM | POA: Diagnosis not present

## 2013-11-09 DIAGNOSIS — M545 Low back pain: Secondary | ICD-10-CM | POA: Diagnosis not present

## 2013-11-12 DIAGNOSIS — R279 Unspecified lack of coordination: Secondary | ICD-10-CM | POA: Diagnosis not present

## 2013-11-12 DIAGNOSIS — M545 Low back pain: Secondary | ICD-10-CM | POA: Diagnosis not present

## 2013-11-12 DIAGNOSIS — R262 Difficulty in walking, not elsewhere classified: Secondary | ICD-10-CM | POA: Diagnosis not present

## 2013-11-14 DIAGNOSIS — R279 Unspecified lack of coordination: Secondary | ICD-10-CM | POA: Diagnosis not present

## 2013-11-14 DIAGNOSIS — M545 Low back pain: Secondary | ICD-10-CM | POA: Diagnosis not present

## 2013-11-14 DIAGNOSIS — R262 Difficulty in walking, not elsewhere classified: Secondary | ICD-10-CM | POA: Diagnosis not present

## 2013-11-15 DIAGNOSIS — M47817 Spondylosis without myelopathy or radiculopathy, lumbosacral region: Secondary | ICD-10-CM | POA: Diagnosis not present

## 2013-11-15 DIAGNOSIS — Z79899 Other long term (current) drug therapy: Secondary | ICD-10-CM | POA: Diagnosis not present

## 2013-11-15 DIAGNOSIS — G894 Chronic pain syndrome: Secondary | ICD-10-CM | POA: Diagnosis not present

## 2013-11-15 DIAGNOSIS — M19019 Primary osteoarthritis, unspecified shoulder: Secondary | ICD-10-CM | POA: Diagnosis not present

## 2013-11-16 DIAGNOSIS — Z6833 Body mass index (BMI) 33.0-33.9, adult: Secondary | ICD-10-CM | POA: Diagnosis not present

## 2013-11-16 DIAGNOSIS — I10 Essential (primary) hypertension: Secondary | ICD-10-CM | POA: Diagnosis not present

## 2013-11-16 DIAGNOSIS — M25519 Pain in unspecified shoulder: Secondary | ICD-10-CM | POA: Diagnosis not present

## 2013-11-16 DIAGNOSIS — Z1331 Encounter for screening for depression: Secondary | ICD-10-CM | POA: Diagnosis not present

## 2013-11-16 DIAGNOSIS — Z79899 Other long term (current) drug therapy: Secondary | ICD-10-CM | POA: Diagnosis not present

## 2013-11-16 DIAGNOSIS — M545 Low back pain: Secondary | ICD-10-CM | POA: Diagnosis not present

## 2013-11-16 DIAGNOSIS — R279 Unspecified lack of coordination: Secondary | ICD-10-CM | POA: Diagnosis not present

## 2013-11-16 DIAGNOSIS — D649 Anemia, unspecified: Secondary | ICD-10-CM | POA: Diagnosis not present

## 2013-11-16 DIAGNOSIS — E785 Hyperlipidemia, unspecified: Secondary | ICD-10-CM | POA: Diagnosis not present

## 2013-11-16 DIAGNOSIS — R262 Difficulty in walking, not elsewhere classified: Secondary | ICD-10-CM | POA: Diagnosis not present

## 2013-11-16 DIAGNOSIS — E039 Hypothyroidism, unspecified: Secondary | ICD-10-CM | POA: Diagnosis not present

## 2013-11-16 DIAGNOSIS — Z23 Encounter for immunization: Secondary | ICD-10-CM | POA: Diagnosis not present

## 2013-11-16 DIAGNOSIS — Z0000 Encounter for general adult medical examination without abnormal findings: Secondary | ICD-10-CM | POA: Diagnosis not present

## 2013-11-19 DIAGNOSIS — M545 Low back pain: Secondary | ICD-10-CM | POA: Diagnosis not present

## 2013-11-19 DIAGNOSIS — R279 Unspecified lack of coordination: Secondary | ICD-10-CM | POA: Diagnosis not present

## 2013-11-19 DIAGNOSIS — R262 Difficulty in walking, not elsewhere classified: Secondary | ICD-10-CM | POA: Diagnosis not present

## 2013-11-19 DIAGNOSIS — Z1212 Encounter for screening for malignant neoplasm of rectum: Secondary | ICD-10-CM | POA: Diagnosis not present

## 2013-11-21 DIAGNOSIS — R262 Difficulty in walking, not elsewhere classified: Secondary | ICD-10-CM | POA: Diagnosis not present

## 2013-11-21 DIAGNOSIS — R279 Unspecified lack of coordination: Secondary | ICD-10-CM | POA: Diagnosis not present

## 2013-11-21 DIAGNOSIS — M545 Low back pain: Secondary | ICD-10-CM | POA: Diagnosis not present

## 2013-11-23 DIAGNOSIS — R262 Difficulty in walking, not elsewhere classified: Secondary | ICD-10-CM | POA: Diagnosis not present

## 2013-11-23 DIAGNOSIS — M545 Low back pain: Secondary | ICD-10-CM | POA: Diagnosis not present

## 2013-11-23 DIAGNOSIS — R279 Unspecified lack of coordination: Secondary | ICD-10-CM | POA: Diagnosis not present

## 2013-11-25 DIAGNOSIS — R279 Unspecified lack of coordination: Secondary | ICD-10-CM | POA: Diagnosis not present

## 2013-11-25 DIAGNOSIS — R262 Difficulty in walking, not elsewhere classified: Secondary | ICD-10-CM | POA: Diagnosis not present

## 2013-11-25 DIAGNOSIS — M545 Low back pain: Secondary | ICD-10-CM | POA: Diagnosis not present

## 2013-11-26 DIAGNOSIS — R279 Unspecified lack of coordination: Secondary | ICD-10-CM | POA: Diagnosis not present

## 2013-11-26 DIAGNOSIS — M545 Low back pain: Secondary | ICD-10-CM | POA: Diagnosis not present

## 2013-11-26 DIAGNOSIS — R262 Difficulty in walking, not elsewhere classified: Secondary | ICD-10-CM | POA: Diagnosis not present

## 2013-11-27 DIAGNOSIS — M25559 Pain in unspecified hip: Secondary | ICD-10-CM | POA: Diagnosis not present

## 2013-11-30 DIAGNOSIS — R262 Difficulty in walking, not elsewhere classified: Secondary | ICD-10-CM | POA: Diagnosis not present

## 2013-11-30 DIAGNOSIS — R279 Unspecified lack of coordination: Secondary | ICD-10-CM | POA: Diagnosis not present

## 2013-11-30 DIAGNOSIS — M545 Low back pain: Secondary | ICD-10-CM | POA: Diagnosis not present

## 2013-12-03 DIAGNOSIS — R279 Unspecified lack of coordination: Secondary | ICD-10-CM | POA: Diagnosis not present

## 2013-12-03 DIAGNOSIS — R262 Difficulty in walking, not elsewhere classified: Secondary | ICD-10-CM | POA: Diagnosis not present

## 2013-12-03 DIAGNOSIS — M545 Low back pain: Secondary | ICD-10-CM | POA: Diagnosis not present

## 2013-12-05 DIAGNOSIS — R279 Unspecified lack of coordination: Secondary | ICD-10-CM | POA: Diagnosis not present

## 2013-12-05 DIAGNOSIS — R262 Difficulty in walking, not elsewhere classified: Secondary | ICD-10-CM | POA: Diagnosis not present

## 2013-12-05 DIAGNOSIS — M545 Low back pain: Secondary | ICD-10-CM | POA: Diagnosis not present

## 2013-12-07 DIAGNOSIS — R262 Difficulty in walking, not elsewhere classified: Secondary | ICD-10-CM | POA: Diagnosis not present

## 2013-12-07 DIAGNOSIS — M545 Low back pain, unspecified: Secondary | ICD-10-CM | POA: Diagnosis not present

## 2013-12-07 DIAGNOSIS — R279 Unspecified lack of coordination: Secondary | ICD-10-CM | POA: Diagnosis not present

## 2013-12-10 DIAGNOSIS — R279 Unspecified lack of coordination: Secondary | ICD-10-CM | POA: Diagnosis not present

## 2013-12-10 DIAGNOSIS — R262 Difficulty in walking, not elsewhere classified: Secondary | ICD-10-CM | POA: Diagnosis not present

## 2013-12-10 DIAGNOSIS — M545 Low back pain, unspecified: Secondary | ICD-10-CM | POA: Diagnosis not present

## 2013-12-31 DIAGNOSIS — M19019 Primary osteoarthritis, unspecified shoulder: Secondary | ICD-10-CM | POA: Diagnosis not present

## 2014-01-01 DIAGNOSIS — M5137 Other intervertebral disc degeneration, lumbosacral region: Secondary | ICD-10-CM | POA: Diagnosis not present

## 2014-01-01 DIAGNOSIS — M47812 Spondylosis without myelopathy or radiculopathy, cervical region: Secondary | ICD-10-CM | POA: Diagnosis not present

## 2014-01-01 DIAGNOSIS — Z79899 Other long term (current) drug therapy: Secondary | ICD-10-CM | POA: Diagnosis not present

## 2014-01-01 DIAGNOSIS — M961 Postlaminectomy syndrome, not elsewhere classified: Secondary | ICD-10-CM | POA: Diagnosis not present

## 2014-01-11 DIAGNOSIS — M25569 Pain in unspecified knee: Secondary | ICD-10-CM | POA: Diagnosis not present

## 2014-01-11 DIAGNOSIS — M1710 Unilateral primary osteoarthritis, unspecified knee: Secondary | ICD-10-CM | POA: Diagnosis not present

## 2014-01-11 DIAGNOSIS — M25519 Pain in unspecified shoulder: Secondary | ICD-10-CM | POA: Diagnosis not present

## 2014-01-25 DIAGNOSIS — E663 Overweight: Secondary | ICD-10-CM | POA: Diagnosis not present

## 2014-01-25 DIAGNOSIS — M549 Dorsalgia, unspecified: Secondary | ICD-10-CM | POA: Diagnosis not present

## 2014-02-01 DIAGNOSIS — H2510 Age-related nuclear cataract, unspecified eye: Secondary | ICD-10-CM | POA: Diagnosis not present

## 2014-02-01 DIAGNOSIS — M47812 Spondylosis without myelopathy or radiculopathy, cervical region: Secondary | ICD-10-CM | POA: Diagnosis not present

## 2014-02-01 DIAGNOSIS — H35059 Retinal neovascularization, unspecified, unspecified eye: Secondary | ICD-10-CM | POA: Diagnosis not present

## 2014-02-01 DIAGNOSIS — M961 Postlaminectomy syndrome, not elsewhere classified: Secondary | ICD-10-CM | POA: Diagnosis not present

## 2014-02-01 DIAGNOSIS — M25569 Pain in unspecified knee: Secondary | ICD-10-CM | POA: Diagnosis not present

## 2014-02-01 DIAGNOSIS — M19019 Primary osteoarthritis, unspecified shoulder: Secondary | ICD-10-CM | POA: Diagnosis not present

## 2014-02-01 DIAGNOSIS — Z79899 Other long term (current) drug therapy: Secondary | ICD-10-CM | POA: Diagnosis not present

## 2014-02-01 DIAGNOSIS — H353190 Nonexudative age-related macular degeneration, unspecified eye, stage unspecified: Secondary | ICD-10-CM | POA: Diagnosis not present

## 2014-02-01 DIAGNOSIS — H353290 Exudative age-related macular degeneration, unspecified eye, stage unspecified: Secondary | ICD-10-CM | POA: Diagnosis not present

## 2014-02-04 DIAGNOSIS — M25569 Pain in unspecified knee: Secondary | ICD-10-CM | POA: Diagnosis not present

## 2014-02-04 DIAGNOSIS — M6281 Muscle weakness (generalized): Secondary | ICD-10-CM | POA: Diagnosis not present

## 2014-02-04 DIAGNOSIS — R293 Abnormal posture: Secondary | ICD-10-CM | POA: Diagnosis not present

## 2014-02-04 DIAGNOSIS — M25519 Pain in unspecified shoulder: Secondary | ICD-10-CM | POA: Diagnosis not present

## 2014-02-08 DIAGNOSIS — M25569 Pain in unspecified knee: Secondary | ICD-10-CM | POA: Diagnosis not present

## 2014-02-08 DIAGNOSIS — M6281 Muscle weakness (generalized): Secondary | ICD-10-CM | POA: Diagnosis not present

## 2014-02-08 DIAGNOSIS — M25519 Pain in unspecified shoulder: Secondary | ICD-10-CM | POA: Diagnosis not present

## 2014-02-08 DIAGNOSIS — R293 Abnormal posture: Secondary | ICD-10-CM | POA: Diagnosis not present

## 2014-02-11 DIAGNOSIS — M25519 Pain in unspecified shoulder: Secondary | ICD-10-CM | POA: Diagnosis not present

## 2014-02-11 DIAGNOSIS — M25569 Pain in unspecified knee: Secondary | ICD-10-CM | POA: Diagnosis not present

## 2014-02-11 DIAGNOSIS — R293 Abnormal posture: Secondary | ICD-10-CM | POA: Diagnosis not present

## 2014-02-11 DIAGNOSIS — M6281 Muscle weakness (generalized): Secondary | ICD-10-CM | POA: Diagnosis not present

## 2014-02-15 DIAGNOSIS — M25569 Pain in unspecified knee: Secondary | ICD-10-CM | POA: Diagnosis not present

## 2014-02-15 DIAGNOSIS — M25519 Pain in unspecified shoulder: Secondary | ICD-10-CM | POA: Diagnosis not present

## 2014-02-15 DIAGNOSIS — R293 Abnormal posture: Secondary | ICD-10-CM | POA: Diagnosis not present

## 2014-02-15 DIAGNOSIS — M6281 Muscle weakness (generalized): Secondary | ICD-10-CM | POA: Diagnosis not present

## 2014-02-18 DIAGNOSIS — M6281 Muscle weakness (generalized): Secondary | ICD-10-CM | POA: Diagnosis not present

## 2014-02-18 DIAGNOSIS — M25519 Pain in unspecified shoulder: Secondary | ICD-10-CM | POA: Diagnosis not present

## 2014-02-18 DIAGNOSIS — R293 Abnormal posture: Secondary | ICD-10-CM | POA: Diagnosis not present

## 2014-02-18 DIAGNOSIS — M25569 Pain in unspecified knee: Secondary | ICD-10-CM | POA: Diagnosis not present

## 2014-02-20 DIAGNOSIS — M6281 Muscle weakness (generalized): Secondary | ICD-10-CM | POA: Diagnosis not present

## 2014-02-20 DIAGNOSIS — M25569 Pain in unspecified knee: Secondary | ICD-10-CM | POA: Diagnosis not present

## 2014-02-20 DIAGNOSIS — R293 Abnormal posture: Secondary | ICD-10-CM | POA: Diagnosis not present

## 2014-02-20 DIAGNOSIS — M25519 Pain in unspecified shoulder: Secondary | ICD-10-CM | POA: Diagnosis not present

## 2014-02-22 DIAGNOSIS — M6281 Muscle weakness (generalized): Secondary | ICD-10-CM | POA: Diagnosis not present

## 2014-02-22 DIAGNOSIS — M25519 Pain in unspecified shoulder: Secondary | ICD-10-CM | POA: Diagnosis not present

## 2014-02-22 DIAGNOSIS — R293 Abnormal posture: Secondary | ICD-10-CM | POA: Diagnosis not present

## 2014-02-22 DIAGNOSIS — M25569 Pain in unspecified knee: Secondary | ICD-10-CM | POA: Diagnosis not present

## 2014-02-25 DIAGNOSIS — M25569 Pain in unspecified knee: Secondary | ICD-10-CM | POA: Diagnosis not present

## 2014-02-25 DIAGNOSIS — M6281 Muscle weakness (generalized): Secondary | ICD-10-CM | POA: Diagnosis not present

## 2014-02-25 DIAGNOSIS — R293 Abnormal posture: Secondary | ICD-10-CM | POA: Diagnosis not present

## 2014-02-25 DIAGNOSIS — M25519 Pain in unspecified shoulder: Secondary | ICD-10-CM | POA: Diagnosis not present

## 2014-03-01 DIAGNOSIS — M25519 Pain in unspecified shoulder: Secondary | ICD-10-CM | POA: Diagnosis not present

## 2014-03-01 DIAGNOSIS — M19019 Primary osteoarthritis, unspecified shoulder: Secondary | ICD-10-CM | POA: Diagnosis not present

## 2014-03-05 DIAGNOSIS — M6281 Muscle weakness (generalized): Secondary | ICD-10-CM | POA: Diagnosis not present

## 2014-03-05 DIAGNOSIS — R293 Abnormal posture: Secondary | ICD-10-CM | POA: Diagnosis not present

## 2014-03-05 DIAGNOSIS — M25569 Pain in unspecified knee: Secondary | ICD-10-CM | POA: Diagnosis not present

## 2014-03-05 DIAGNOSIS — M25519 Pain in unspecified shoulder: Secondary | ICD-10-CM | POA: Diagnosis not present

## 2014-03-06 DIAGNOSIS — R293 Abnormal posture: Secondary | ICD-10-CM | POA: Diagnosis not present

## 2014-03-06 DIAGNOSIS — M25519 Pain in unspecified shoulder: Secondary | ICD-10-CM | POA: Diagnosis not present

## 2014-03-06 DIAGNOSIS — M25569 Pain in unspecified knee: Secondary | ICD-10-CM | POA: Diagnosis not present

## 2014-03-06 DIAGNOSIS — M6281 Muscle weakness (generalized): Secondary | ICD-10-CM | POA: Diagnosis not present

## 2014-03-08 DIAGNOSIS — M25569 Pain in unspecified knee: Secondary | ICD-10-CM | POA: Diagnosis not present

## 2014-03-08 DIAGNOSIS — M6281 Muscle weakness (generalized): Secondary | ICD-10-CM | POA: Diagnosis not present

## 2014-03-08 DIAGNOSIS — M25519 Pain in unspecified shoulder: Secondary | ICD-10-CM | POA: Diagnosis not present

## 2014-03-08 DIAGNOSIS — R293 Abnormal posture: Secondary | ICD-10-CM | POA: Diagnosis not present

## 2014-03-11 DIAGNOSIS — M6281 Muscle weakness (generalized): Secondary | ICD-10-CM | POA: Diagnosis not present

## 2014-03-11 DIAGNOSIS — R293 Abnormal posture: Secondary | ICD-10-CM | POA: Diagnosis not present

## 2014-03-11 DIAGNOSIS — M25569 Pain in unspecified knee: Secondary | ICD-10-CM | POA: Diagnosis not present

## 2014-03-11 DIAGNOSIS — M25519 Pain in unspecified shoulder: Secondary | ICD-10-CM | POA: Diagnosis not present

## 2014-03-12 DIAGNOSIS — M25519 Pain in unspecified shoulder: Secondary | ICD-10-CM | POA: Diagnosis not present

## 2014-03-12 DIAGNOSIS — M6281 Muscle weakness (generalized): Secondary | ICD-10-CM | POA: Diagnosis not present

## 2014-03-12 DIAGNOSIS — M25569 Pain in unspecified knee: Secondary | ICD-10-CM | POA: Diagnosis not present

## 2014-03-12 DIAGNOSIS — R293 Abnormal posture: Secondary | ICD-10-CM | POA: Diagnosis not present

## 2014-03-18 DIAGNOSIS — M25569 Pain in unspecified knee: Secondary | ICD-10-CM | POA: Diagnosis not present

## 2014-03-18 DIAGNOSIS — M6281 Muscle weakness (generalized): Secondary | ICD-10-CM | POA: Diagnosis not present

## 2014-03-18 DIAGNOSIS — R293 Abnormal posture: Secondary | ICD-10-CM | POA: Diagnosis not present

## 2014-03-18 DIAGNOSIS — M25519 Pain in unspecified shoulder: Secondary | ICD-10-CM | POA: Diagnosis not present

## 2014-03-19 DIAGNOSIS — M25519 Pain in unspecified shoulder: Secondary | ICD-10-CM | POA: Diagnosis not present

## 2014-03-19 DIAGNOSIS — M25569 Pain in unspecified knee: Secondary | ICD-10-CM | POA: Diagnosis not present

## 2014-03-19 DIAGNOSIS — M6281 Muscle weakness (generalized): Secondary | ICD-10-CM | POA: Diagnosis not present

## 2014-03-19 DIAGNOSIS — R293 Abnormal posture: Secondary | ICD-10-CM | POA: Diagnosis not present

## 2014-03-21 DIAGNOSIS — R293 Abnormal posture: Secondary | ICD-10-CM | POA: Diagnosis not present

## 2014-03-21 DIAGNOSIS — M6281 Muscle weakness (generalized): Secondary | ICD-10-CM | POA: Diagnosis not present

## 2014-03-21 DIAGNOSIS — M25519 Pain in unspecified shoulder: Secondary | ICD-10-CM | POA: Diagnosis not present

## 2014-03-21 DIAGNOSIS — M25569 Pain in unspecified knee: Secondary | ICD-10-CM | POA: Diagnosis not present

## 2014-03-25 DIAGNOSIS — R293 Abnormal posture: Secondary | ICD-10-CM | POA: Diagnosis not present

## 2014-03-25 DIAGNOSIS — M25569 Pain in unspecified knee: Secondary | ICD-10-CM | POA: Diagnosis not present

## 2014-03-25 DIAGNOSIS — M25519 Pain in unspecified shoulder: Secondary | ICD-10-CM | POA: Diagnosis not present

## 2014-03-25 DIAGNOSIS — M6281 Muscle weakness (generalized): Secondary | ICD-10-CM | POA: Diagnosis not present

## 2014-03-26 ENCOUNTER — Other Ambulatory Visit: Payer: Self-pay | Admitting: Obstetrics & Gynecology

## 2014-03-26 NOTE — Telephone Encounter (Signed)
Nystatin/Triamcinolone cream refill needed per patient.   [facility] [employer]

## 2014-03-26 NOTE — Telephone Encounter (Signed)
Last AEX 06/07/2013 Next appt 08/16/2014  Paper chart in your door.  Please approve or deny Rx.

## 2014-03-27 MED ORDER — NYSTATIN-TRIAMCINOLONE 100000-0.1 UNIT/GM-% EX OINT: 1.0000 "application " | TOPICAL_OINTMENT | Freq: Two times a day (BID) | CUTANEOUS | Status: DC

## 2014-03-29 DIAGNOSIS — M25519 Pain in unspecified shoulder: Secondary | ICD-10-CM | POA: Diagnosis not present

## 2014-03-29 DIAGNOSIS — M25569 Pain in unspecified knee: Secondary | ICD-10-CM | POA: Diagnosis not present

## 2014-03-29 DIAGNOSIS — R293 Abnormal posture: Secondary | ICD-10-CM | POA: Diagnosis not present

## 2014-03-29 DIAGNOSIS — M6281 Muscle weakness (generalized): Secondary | ICD-10-CM | POA: Diagnosis not present

## 2014-04-08 DIAGNOSIS — M25569 Pain in unspecified knee: Secondary | ICD-10-CM | POA: Diagnosis not present

## 2014-04-08 DIAGNOSIS — R293 Abnormal posture: Secondary | ICD-10-CM | POA: Diagnosis not present

## 2014-04-08 DIAGNOSIS — M6281 Muscle weakness (generalized): Secondary | ICD-10-CM | POA: Diagnosis not present

## 2014-04-08 DIAGNOSIS — M25519 Pain in unspecified shoulder: Secondary | ICD-10-CM | POA: Diagnosis not present

## 2014-04-10 DIAGNOSIS — G894 Chronic pain syndrome: Secondary | ICD-10-CM | POA: Diagnosis not present

## 2014-04-10 DIAGNOSIS — M961 Postlaminectomy syndrome, not elsewhere classified: Secondary | ICD-10-CM | POA: Diagnosis not present

## 2014-04-10 DIAGNOSIS — M19019 Primary osteoarthritis, unspecified shoulder: Secondary | ICD-10-CM | POA: Diagnosis not present

## 2014-04-10 DIAGNOSIS — M5137 Other intervertebral disc degeneration, lumbosacral region: Secondary | ICD-10-CM | POA: Diagnosis not present

## 2014-04-17 DIAGNOSIS — M19019 Primary osteoarthritis, unspecified shoulder: Secondary | ICD-10-CM | POA: Diagnosis not present

## 2014-04-24 ENCOUNTER — Other Ambulatory Visit: Payer: Self-pay | Admitting: Internal Medicine

## 2014-04-24 ENCOUNTER — Other Ambulatory Visit: Payer: Self-pay | Admitting: Orthopedic Surgery

## 2014-04-26 DIAGNOSIS — M19019 Primary osteoarthritis, unspecified shoulder: Secondary | ICD-10-CM | POA: Diagnosis not present

## 2014-05-02 NOTE — Pre-Procedure Instructions (Signed)
[name] [name]  05/02/2014   Your procedure is scheduled on:  Thursday, June 4th  Report to [facility] Admitting at 0530 AM.  Call this number if you have problems the morning of surgery: [phone]   Remember:   Do not eat food or drink liquids after midnight.   Take these medicines the morning of surgery with A SIP OF WATER: toprol, valium if needed, flonase, neurontin, synthroid, methadone, oxycodone if needed  Stop taking aspirin, over the counter vitamins/herbal medications, NSAIDS (ibuprofen, advil) as of today.   Do not wear jewelry, make-up or nail polish.  Do not wear lotions, powders, or perfume,deodorant.  Do not shave 48 hours prior to surgery. Men may shave face and neck.  Do not bring valuables to the hospital.  [facility] is not responsible  for any belongings or valuables.               Contacts, dentures or bridgework may not be worn into surgery.  Leave suitcase in the car. After surgery it may be brought to your room.  For patients admitted to the hospital, discharge time is determined by your  treatment team.               Patients discharged the day of surgery will not be allowed to drive home.  Please read over the following fact sheets that you were given: Pain Booklet, Coughing and Deep Breathing, Blood Transfusion Information and Surgical Site Infection Prevention [place] - Preparing for Surgery  Before surgery, you can play an important role.  Because skin is not sterile, your skin needs to be as free of germs as possible.  You can reduce the number of germs on you skin by washing with CHG (chlorahexidine gluconate) soap before surgery.  CHG is an antiseptic cleaner which kills germs and bonds with the skin to continue killing germs even after washing.  Please DO NOT use if you have an allergy to CHG or antibacterial soaps.  If your skin becomes reddened/irritated stop using the CHG and inform your nurse when you arrive at Short Stay.  Do not  shave (including legs and underarms) for at least 48 hours prior to the first CHG shower.  You may shave your face.  Please follow these instructions carefully:   1.  Shower with CHG Soap the night before surgery and the morning of Surgery.  2.  If you choose to wash your hair, wash your hair first as usual with your normal shampoo.  3.  After you shampoo, rinse your hair and body thoroughly to remove the shampoo.  4.  Use CHG as you would any other liquid soap.  You can apply CHG directly to the skin and wash gently with scrungie or a clean washcloth.  5.  Apply the CHG Soap to your body ONLY FROM THE NECK DOWN.  Do not use on open wounds or open sores.  Avoid contact with your eyes, ears, mouth and genitals (private parts).  Wash genitals (private parts) with your normal soap.  6.  Wash thoroughly, paying special attention to the area where your surgery will be performed.  7.  Thoroughly rinse your body with warm water from the neck down.  8.  DO NOT shower/wash with your normal soap after using and rinsing off the CHG Soap.  9.  Pat yourself dry with a clean towel.            10.  Wear clean pajamas.  11.  Place clean sheets on your bed the night of your first shower and do not sleep with pets.  Day of Surgery  Do not apply any lotions/deoderants the morning of surgery.  Please wear clean clothes to the hospital/surgery center.

## 2014-05-03 ENCOUNTER — Encounter (HOSPITAL_COMMUNITY)
Admission: RE | Admit: 2014-05-03 | Discharge: 2014-05-03 | Disposition: A | Discharge status: Home / Self Care | Payer: Medicare Other | Source: Ambulatory Visit | Attending: Orthopedic Surgery | Admitting: Orthopedic Surgery

## 2014-05-03 ENCOUNTER — Encounter (HOSPITAL_COMMUNITY): Payer: Self-pay

## 2014-05-03 DIAGNOSIS — Z01812 Encounter for preprocedural laboratory examination: Secondary | ICD-10-CM | POA: Insufficient documentation

## 2014-05-03 HISTORY — DX: Anemia, unspecified: D64.9

## 2014-05-03 LAB — COMPREHENSIVE METABOLIC PANEL WITH GFR
ALT: 20 U/L (ref 0–35)
AST: 27 U/L (ref 0–37)
Albumin: 4 g/dL (ref 3.5–5.2)
Alkaline Phosphatase: 99 U/L (ref 39–117)
BUN: 18 mg/dL (ref 6–23)
CO2: 31 meq/L (ref 19–32)
Calcium: 9.5 mg/dL (ref 8.4–10.5)
Chloride: 97 meq/L (ref 96–112)
Creatinine, Ser: 0.81 mg/dL (ref 0.50–1.10)
GFR calc Af Amer: 82 mL/min — ABNORMAL LOW
GFR calc non Af Amer: 71 mL/min — ABNORMAL LOW
Glucose, Bld: 93 mg/dL (ref 70–99)
Potassium: 3.6 meq/L — ABNORMAL LOW (ref 3.7–5.3)
Sodium: 140 meq/L (ref 137–147)
Total Bilirubin: 0.3 mg/dL (ref 0.3–1.2)
Total Protein: 7.3 g/dL (ref 6.0–8.3)

## 2014-05-03 LAB — URINALYSIS, ROUTINE W REFLEX MICROSCOPIC
Bilirubin Urine: NEGATIVE
Glucose, UA: NEGATIVE mg/dL
HGB URINE DIPSTICK: NEGATIVE
Ketones, ur: NEGATIVE mg/dL
Leukocytes, UA: NEGATIVE
Nitrite: NEGATIVE
PROTEIN: NEGATIVE mg/dL
Specific Gravity, Urine: 1.012 (ref 1.005–1.030)
UROBILINOGEN UA: 0.2 mg/dL (ref 0.0–1.0)
pH: 5.5 (ref 5.0–8.0)

## 2014-05-03 LAB — CBC WITH DIFFERENTIAL/PLATELET
BASOS ABS: 0 10*3/uL (ref 0.0–0.1)
BASOS PCT: 1 % (ref 0–1)
EOS ABS: 0.2 10*3/uL (ref 0.0–0.7)
EOS PCT: 3 % (ref 0–5)
HEMATOCRIT: 36.5 % (ref 36.0–46.0)
Hemoglobin: 12.5 g/dL (ref 12.0–15.0)
Lymphocytes Relative: 28 % (ref 12–46)
Lymphs Abs: 1.9 10*3/uL (ref 0.7–4.0)
MCH: 31.2 pg (ref 26.0–34.0)
MCHC: 34.2 g/dL (ref 30.0–36.0)
MCV: 91 fL (ref 78.0–100.0)
MONO ABS: 0.5 10*3/uL (ref 0.1–1.0)
Monocytes Relative: 7 % (ref 3–12)
Neutro Abs: 4 10*3/uL (ref 1.7–7.7)
Neutrophils Relative %: 61 % (ref 43–77)
PLATELETS: 192 10*3/uL (ref 150–400)
RBC: 4.01 MIL/uL (ref 3.87–5.11)
RDW: 13.6 % (ref 11.5–15.5)
WBC: 6.6 10*3/uL (ref 4.0–10.5)

## 2014-05-03 LAB — TYPE AND SCREEN
ABO/RH(D): O POS
Antibody Screen: NEGATIVE

## 2014-05-03 LAB — PROTIME-INR
INR: 1.01 (ref 0.00–1.49)
Prothrombin Time: 13.1 seconds (ref 11.6–15.2)

## 2014-05-03 LAB — APTT: aPTT: 34 seconds (ref 24–37)

## 2014-05-03 NOTE — Progress Notes (Signed)
Primary physician -- dr. Bevelyn Buckles No cardiologist  ek[facility] in [place] 2014 in epic no other cardiac testing

## 2014-05-06 DIAGNOSIS — M25519 Pain in unspecified shoulder: Secondary | ICD-10-CM | POA: Diagnosis not present

## 2014-05-06 DIAGNOSIS — G4733 Obstructive sleep apnea (adult) (pediatric): Secondary | ICD-10-CM | POA: Diagnosis not present

## 2014-05-06 DIAGNOSIS — M545 Low back pain, unspecified: Secondary | ICD-10-CM | POA: Diagnosis not present

## 2014-05-06 DIAGNOSIS — Z6833 Body mass index (BMI) 33.0-33.9, adult: Secondary | ICD-10-CM | POA: Diagnosis not present

## 2014-05-06 DIAGNOSIS — E039 Hypothyroidism, unspecified: Secondary | ICD-10-CM | POA: Diagnosis not present

## 2014-05-06 DIAGNOSIS — N183 Chronic kidney disease, stage 3 unspecified: Secondary | ICD-10-CM | POA: Diagnosis not present

## 2014-05-06 DIAGNOSIS — Z01818 Encounter for other preprocedural examination: Secondary | ICD-10-CM | POA: Diagnosis not present

## 2014-05-06 DIAGNOSIS — I10 Essential (primary) hypertension: Secondary | ICD-10-CM | POA: Diagnosis not present

## 2014-05-08 MED ORDER — POVIDONE-IODINE 7.5 % EX SOLN
Freq: Once | CUTANEOUS | Status: DC
  Filled 2014-05-08: qty 118

## 2014-05-08 MED ORDER — CEFAZOLIN SODIUM-DEXTROSE 2-3 GM-% IV SOLR
2.0000 g | INTRAVENOUS | Status: AC
  Administered 2014-05-09: 2 g via INTRAVENOUS
  Filled 2014-05-08: qty 50

## 2014-05-09 ENCOUNTER — Encounter (HOSPITAL_COMMUNITY): Payer: Medicare Other | Admitting: Anesthesiology

## 2014-05-09 ENCOUNTER — Inpatient Hospital Stay (HOSPITAL_COMMUNITY)
Admission: RE | Admit: 2014-05-09 | Discharge: 2014-05-11 | DRG: 483 | Disposition: A | Discharge status: Home Health | Payer: Medicare Other | Source: Ambulatory Visit | Attending: Orthopedic Surgery | Admitting: Orthopedic Surgery

## 2014-05-09 ENCOUNTER — Encounter (HOSPITAL_COMMUNITY): Payer: Self-pay | Admitting: *Deleted

## 2014-05-09 ENCOUNTER — Inpatient Hospital Stay (HOSPITAL_COMMUNITY): Payer: Medicare Other

## 2014-05-09 ENCOUNTER — Encounter (HOSPITAL_COMMUNITY)
Admission: RE | Disposition: A | Discharge status: Home Health | Payer: Self-pay | Source: Ambulatory Visit | Attending: Orthopedic Surgery

## 2014-05-09 ENCOUNTER — Inpatient Hospital Stay (HOSPITAL_COMMUNITY): Payer: Medicare Other | Admitting: Anesthesiology

## 2014-05-09 DIAGNOSIS — M259 Joint disorder, unspecified: Secondary | ICD-10-CM | POA: Diagnosis not present

## 2014-05-09 DIAGNOSIS — Z96659 Presence of unspecified artificial knee joint: Secondary | ICD-10-CM

## 2014-05-09 DIAGNOSIS — K219 Gastro-esophageal reflux disease without esophagitis: Secondary | ICD-10-CM | POA: Diagnosis present

## 2014-05-09 DIAGNOSIS — E039 Hypothyroidism, unspecified: Secondary | ICD-10-CM | POA: Diagnosis present

## 2014-05-09 DIAGNOSIS — Z7982 Long term (current) use of aspirin: Secondary | ICD-10-CM

## 2014-05-09 DIAGNOSIS — M19019 Primary osteoarthritis, unspecified shoulder: Secondary | ICD-10-CM | POA: Diagnosis not present

## 2014-05-09 DIAGNOSIS — G8929 Other chronic pain: Secondary | ICD-10-CM | POA: Diagnosis present

## 2014-05-09 DIAGNOSIS — Z96619 Presence of unspecified artificial shoulder joint: Secondary | ICD-10-CM | POA: Diagnosis not present

## 2014-05-09 DIAGNOSIS — M25519 Pain in unspecified shoulder: Secondary | ICD-10-CM | POA: Diagnosis not present

## 2014-05-09 DIAGNOSIS — Z79899 Other long term (current) drug therapy: Secondary | ICD-10-CM

## 2014-05-09 DIAGNOSIS — I10 Essential (primary) hypertension: Secondary | ICD-10-CM | POA: Diagnosis not present

## 2014-05-09 DIAGNOSIS — G8918 Other acute postprocedural pain: Secondary | ICD-10-CM | POA: Diagnosis not present

## 2014-05-09 HISTORY — PX: TOTAL SHOULDER ARTHROPLASTY: SHX126

## 2014-05-09 SURGERY — ARTHROPLASTY, SHOULDER, TOTAL
Anesthesia: General | Site: Shoulder | Laterality: Right

## 2014-05-09 MED ORDER — METOCLOPRAMIDE HCL 10 MG PO TABS
5.0000 mg | ORAL_TABLET | Freq: Three times a day (TID) | ORAL | Status: DC | PRN
Start: 2014-05-09 — End: 2014-05-11

## 2014-05-09 MED ORDER — ONDANSETRON HCL 4 MG/2ML IJ SOLN: 4.0000 mg | Freq: Four times a day (QID) | INTRAMUSCULAR | Status: DC | PRN

## 2014-05-09 MED ORDER — FLEET ENEMA 7-19 GM/118ML RE ENEM: 1.0000 | ENEMA | Freq: Once | RECTAL | Status: AC | PRN

## 2014-05-09 MED ORDER — SENNOSIDES 8.6 MG PO TABS
2.0000 | ORAL_TABLET | Freq: Two times a day (BID) | ORAL | Status: DC
Start: 2014-05-09 — End: 2014-05-09

## 2014-05-09 MED ORDER — SODIUM CHLORIDE 0.9 % IJ SOLN
INTRAMUSCULAR | Status: AC
  Filled 2014-05-09: qty 10

## 2014-05-09 MED ORDER — FENTANYL CITRATE 0.05 MG/ML IJ SOLN
INTRAMUSCULAR | Status: AC
  Filled 2014-05-09: qty 5

## 2014-05-09 MED ORDER — BUPIVACAINE-EPINEPHRINE (PF) 0.5% -1:200000 IJ SOLN
INTRAMUSCULAR | Status: DC | PRN
  Administered 2014-05-09: 30 mL

## 2014-05-09 MED ORDER — SENNA 8.6 MG PO TABS
2.0000 | ORAL_TABLET | Freq: Every day | ORAL | Status: DC
  Administered 2014-05-09 – 2014-05-11 (×3): 17.2 mg via ORAL
  Filled 2014-05-09 (×3): qty 2

## 2014-05-09 MED ORDER — LORATADINE 10 MG PO TABS
10.0000 mg | ORAL_TABLET | Freq: Every day | ORAL | Status: DC | PRN
  Filled 2014-05-09: qty 1

## 2014-05-09 MED ORDER — ALUMINUM HYDROXIDE GEL 320 MG/5ML PO SUSP: 15.0000 mL | ORAL | Status: DC | PRN

## 2014-05-09 MED ORDER — LORATADINE 10 MG PO TABS: 10.0000 mg | ORAL_TABLET | Freq: Every day | ORAL | Status: DC

## 2014-05-09 MED ORDER — ACETAMINOPHEN 325 MG PO TABS: 650.0000 mg | ORAL_TABLET | Freq: Four times a day (QID) | ORAL | Status: DC | PRN

## 2014-05-09 MED ORDER — SODIUM CHLORIDE 0.9 % IV SOLN
INTRAVENOUS | Status: DC
  Administered 2014-05-09 – 2014-05-10 (×2): via INTRAVENOUS

## 2014-05-09 MED ORDER — ROCURONIUM BROMIDE 100 MG/10ML IV SOLN
INTRAVENOUS | Status: DC | PRN
  Administered 2014-05-09: 50 mg via INTRAVENOUS

## 2014-05-09 MED ORDER — LEVOTHYROXINE SODIUM 75 MCG PO TABS
75.0000 ug | ORAL_TABLET | Freq: Every day | ORAL | Status: DC
  Administered 2014-05-10 – 2014-05-11 (×2): 75 ug via ORAL
  Filled 2014-05-09 (×3): qty 1

## 2014-05-09 MED ORDER — PROMETHAZINE HCL 25 MG/ML IJ SOLN
6.2500 mg | INTRAMUSCULAR | Status: DC | PRN
Start: 2014-05-09 — End: 2014-05-09

## 2014-05-09 MED ORDER — PROPOFOL 10 MG/ML IV BOLUS
INTRAVENOUS | Status: AC
  Filled 2014-05-09: qty 20

## 2014-05-09 MED ORDER — POLYVINYL ALCOHOL 1.4 % OP SOLN
1.0000 [drp] | Freq: Four times a day (QID) | OPHTHALMIC | Status: DC | PRN
  Filled 2014-05-09: qty 15

## 2014-05-09 MED ORDER — OXYCODONE HCL 5 MG PO TABS
5.0000 mg | ORAL_TABLET | ORAL | Status: DC | PRN
  Administered 2014-05-09 – 2014-05-11 (×8): 5 mg via ORAL
  Filled 2014-05-09 (×8): qty 1

## 2014-05-09 MED ORDER — PHENYLEPHRINE 40 MCG/ML (10ML) SYRINGE FOR IV PUSH (FOR BLOOD PRESSURE SUPPORT)
PREFILLED_SYRINGE | INTRAVENOUS | Status: AC
  Filled 2014-05-09: qty 10

## 2014-05-09 MED ORDER — CARVEDILOL 6.25 MG PO TABS
6.2500 mg | ORAL_TABLET | Freq: Two times a day (BID) | ORAL | Status: DC
  Administered 2014-05-09 – 2014-05-11 (×4): 6.25 mg via ORAL
  Filled 2014-05-09 (×6): qty 1

## 2014-05-09 MED ORDER — HYDROXYZINE HCL 10 MG PO TABS
5.0000 mg | ORAL_TABLET | Freq: Every evening | ORAL | Status: DC
  Administered 2014-05-09 – 2014-05-10 (×2): 5 mg via ORAL
  Filled 2014-05-09 (×3): qty 1

## 2014-05-09 MED ORDER — GLYCOPYRROLATE 0.2 MG/ML IJ SOLN
INTRAMUSCULAR | Status: AC
  Filled 2014-05-09: qty 1

## 2014-05-09 MED ORDER — FENTANYL CITRATE 0.05 MG/ML IJ SOLN
INTRAMUSCULAR | Status: DC | PRN
  Administered 2014-05-09: 100 ug via INTRAVENOUS

## 2014-05-09 MED ORDER — MIDAZOLAM HCL 5 MG/5ML IJ SOLN
INTRAMUSCULAR | Status: DC | PRN
  Administered 2014-05-09: 2 mg via INTRAVENOUS

## 2014-05-09 MED ORDER — PROPOFOL 10 MG/ML IV BOLUS
INTRAVENOUS | Status: DC | PRN
  Administered 2014-05-09: 170 mg via INTRAVENOUS

## 2014-05-09 MED ORDER — EPHEDRINE SULFATE 50 MG/ML IJ SOLN
INTRAMUSCULAR | Status: AC
  Filled 2014-05-09: qty 1

## 2014-05-09 MED ORDER — BISACODYL 5 MG PO TBEC: 5.0000 mg | DELAYED_RELEASE_TABLET | Freq: Every day | ORAL | Status: DC | PRN

## 2014-05-09 MED ORDER — NEOSTIGMINE METHYLSULFATE 10 MG/10ML IV SOLN
INTRAVENOUS | Status: DC | PRN
  Administered 2014-05-09: 3 mg via INTRAVENOUS
  Administered 2014-05-09: 1 mg via INTRAVENOUS

## 2014-05-09 MED ORDER — FUROSEMIDE 40 MG PO TABS
40.0000 mg | ORAL_TABLET | Freq: Every day | ORAL | Status: DC
  Administered 2014-05-10 – 2014-05-11 (×2): 40 mg via ORAL
  Filled 2014-05-09 (×3): qty 1

## 2014-05-09 MED ORDER — METHADONE HCL 5 MG PO TABS
5.0000 mg | ORAL_TABLET | Freq: Four times a day (QID) | ORAL | Status: DC
  Administered 2014-05-09 – 2014-05-11 (×8): 5 mg via ORAL
  Filled 2014-05-09 (×8): qty 1

## 2014-05-09 MED ORDER — ROCURONIUM BROMIDE 50 MG/5ML IV SOLN
INTRAVENOUS | Status: AC
  Filled 2014-05-09: qty 1

## 2014-05-09 MED ORDER — GLYCOPYRROLATE 0.2 MG/ML IJ SOLN
INTRAMUSCULAR | Status: DC | PRN
  Administered 2014-05-09: 0.2 mg via INTRAVENOUS
  Administered 2014-05-09: 0.4 mg via INTRAVENOUS

## 2014-05-09 MED ORDER — GLYCOPYRROLATE 0.2 MG/ML IJ SOLN
INTRAMUSCULAR | Status: AC
  Filled 2014-05-09: qty 2

## 2014-05-09 MED ORDER — DEXAMETHASONE SODIUM PHOSPHATE 4 MG/ML IJ SOLN
INTRAMUSCULAR | Status: DC | PRN
  Administered 2014-05-09: 4 mg via INTRAVENOUS

## 2014-05-09 MED ORDER — GABAPENTIN 600 MG PO TABS
600.0000 mg | ORAL_TABLET | Freq: Three times a day (TID) | ORAL | Status: DC
  Administered 2014-05-09 – 2014-05-11 (×6): 600 mg via ORAL
  Filled 2014-05-09 (×8): qty 1

## 2014-05-09 MED ORDER — PHENOL 1.4 % MT LIQD: 1.0000 | OROMUCOSAL | Status: DC | PRN

## 2014-05-09 MED ORDER — ACETAMINOPHEN 650 MG RE SUPP: 650.0000 mg | Freq: Four times a day (QID) | RECTAL | Status: DC | PRN

## 2014-05-09 MED ORDER — CEFAZOLIN SODIUM-DEXTROSE 2-3 GM-% IV SOLR
2.0000 g | Freq: Four times a day (QID) | INTRAVENOUS | Status: AC
  Administered 2014-05-09 – 2014-05-10 (×3): 2 g via INTRAVENOUS
  Filled 2014-05-09 (×3): qty 50

## 2014-05-09 MED ORDER — ONDANSETRON HCL 4 MG/2ML IJ SOLN
INTRAMUSCULAR | Status: DC | PRN
  Administered 2014-05-09: 4 mg via INTRAVENOUS

## 2014-05-09 MED ORDER — SENNA 8.6 MG PO TABS
3.0000 | ORAL_TABLET | Freq: Every day | ORAL | Status: DC
  Administered 2014-05-09 – 2014-05-10 (×2): 25.8 mg via ORAL
  Filled 2014-05-09 (×3): qty 3

## 2014-05-09 MED ORDER — DIPHENHYDRAMINE HCL 12.5 MG/5ML PO ELIX: 12.5000 mg | ORAL_SOLUTION | ORAL | Status: DC | PRN

## 2014-05-09 MED ORDER — MIDAZOLAM HCL 2 MG/2ML IJ SOLN
INTRAMUSCULAR | Status: AC
  Filled 2014-05-09: qty 2

## 2014-05-09 MED ORDER — PANTOPRAZOLE SODIUM 40 MG PO TBEC
40.0000 mg | DELAYED_RELEASE_TABLET | Freq: Every day | ORAL | Status: DC
  Administered 2014-05-10 – 2014-05-11 (×2): 40 mg via ORAL
  Filled 2014-05-09 (×2): qty 1

## 2014-05-09 MED ORDER — GLYCERIN-POLYSORBATE 80 1-1 % OP SOLN: Freq: Four times a day (QID) | OPHTHALMIC | Status: DC | PRN

## 2014-05-09 MED ORDER — LACTATED RINGERS IV SOLN
INTRAVENOUS | Status: DC | PRN
  Administered 2014-05-09 (×2): via INTRAVENOUS

## 2014-05-09 MED ORDER — ONDANSETRON HCL 4 MG PO TABS: 4.0000 mg | ORAL_TABLET | Freq: Four times a day (QID) | ORAL | Status: DC | PRN

## 2014-05-09 MED ORDER — NEOSTIGMINE METHYLSULFATE 10 MG/10ML IV SOLN
INTRAVENOUS | Status: AC
  Filled 2014-05-09: qty 1

## 2014-05-09 MED ORDER — SCOPOLAMINE 1 MG/3DAYS TD PT72
MEDICATED_PATCH | TRANSDERMAL | Status: AC
  Administered 2014-05-09: 1 via TRANSDERMAL
  Filled 2014-05-09: qty 1

## 2014-05-09 MED ORDER — MENTHOL 3 MG MT LOZG: 1.0000 | LOZENGE | OROMUCOSAL | Status: DC | PRN

## 2014-05-09 MED ORDER — SODIUM CHLORIDE 0.9 % IR SOLN
Status: DC | PRN
  Administered 2014-05-09: 3000 mL

## 2014-05-09 MED ORDER — DIAZEPAM 2 MG PO TABS
4.0000 mg | ORAL_TABLET | Freq: Four times a day (QID) | ORAL | Status: DC | PRN
  Administered 2014-05-10: 4 mg via ORAL
  Filled 2014-05-09 (×2): qty 2

## 2014-05-09 MED ORDER — ASPIRIN EC 325 MG PO TBEC
325.0000 mg | DELAYED_RELEASE_TABLET | Freq: Two times a day (BID) | ORAL | Status: DC
  Administered 2014-05-09 – 2014-05-11 (×4): 325 mg via ORAL
  Filled 2014-05-09 (×6): qty 1

## 2014-05-09 MED ORDER — METOCLOPRAMIDE HCL 5 MG/ML IJ SOLN: 5.0000 mg | Freq: Three times a day (TID) | INTRAMUSCULAR | Status: DC | PRN

## 2014-05-09 MED ORDER — FLUTICASONE PROPIONATE 50 MCG/ACT NA SUSP: 2.0000 | Freq: Every day | NASAL | Status: DC | PRN

## 2014-05-09 MED ORDER — PHENYLEPHRINE HCL 10 MG/ML IJ SOLN
10.0000 mg | INTRAVENOUS | Status: DC | PRN
  Administered 2014-05-09: 30 ug/min via INTRAVENOUS

## 2014-05-09 MED ORDER — SIMVASTATIN 10 MG PO TABS
10.0000 mg | ORAL_TABLET | Freq: Every day | ORAL | Status: DC
  Administered 2014-05-09 – 2014-05-10 (×2): 10 mg via ORAL
  Filled 2014-05-09 (×3): qty 1

## 2014-05-09 MED ORDER — ONDANSETRON HCL 4 MG/2ML IJ SOLN
INTRAMUSCULAR | Status: AC
  Filled 2014-05-09: qty 2

## 2014-05-09 MED ORDER — LIDOCAINE HCL (CARDIAC) 20 MG/ML IV SOLN
INTRAVENOUS | Status: AC
  Filled 2014-05-09: qty 5

## 2014-05-09 MED ORDER — HYDROMORPHONE HCL PF 1 MG/ML IJ SOLN: 0.2500 mg | INTRAMUSCULAR | Status: DC | PRN

## 2014-05-09 MED ORDER — DOCUSATE SODIUM 100 MG PO CAPS
100.0000 mg | ORAL_CAPSULE | Freq: Two times a day (BID) | ORAL | Status: DC
  Administered 2014-05-09 – 2014-05-11 (×5): 100 mg via ORAL
  Filled 2014-05-09 (×6): qty 1

## 2014-05-09 MED ORDER — HYDROMORPHONE HCL PF 1 MG/ML IJ SOLN
0.5000 mg | INTRAMUSCULAR | Status: DC | PRN
  Administered 2014-05-09: 0.5 mg via INTRAVENOUS
  Filled 2014-05-09: qty 1

## 2014-05-09 MED ORDER — SUCCINYLCHOLINE CHLORIDE 20 MG/ML IJ SOLN
INTRAMUSCULAR | Status: AC
  Filled 2014-05-09: qty 1

## 2014-05-09 SURGICAL SUPPLY — 74 items
ASSEMBLY NECK TAPER FIXED 135 (Orthopedic Implant) ×2 IMPLANT
BIT DRILL 5/64X5 DISP (BIT) IMPLANT
BLADE SAW SAG 73X25 THK (BLADE) ×2
BLADE SAW SGTL 73X25 THK (BLADE) ×1 IMPLANT
BLADE SURG 15 STRL LF DISP TIS (BLADE) ×1 IMPLANT
BLADE SURG 15 STRL SS (BLADE) ×3
CEMENT BONE DEPUY (Cement) ×3 IMPLANT
CHLORAPREP W/TINT 26ML (MISCELLANEOUS) ×6 IMPLANT
CLOSURE STERI-STRIP 1/2X4 (GAUZE/BANDAGES/DRESSINGS) ×1
CLOSURE WOUND 1/2 X4 (GAUZE/BANDAGES/DRESSINGS) ×1
CLSR STERI-STRIP ANTIMIC 1/2X4 (GAUZE/BANDAGES/DRESSINGS) ×2 IMPLANT
COVER SURGICAL LIGHT HANDLE (MISCELLANEOUS) ×3 IMPLANT
DRAPE INCISE IOBAN 66X45 STRL (DRAPES) ×6 IMPLANT
DRAPE SURG 17X23 STRL (DRAPES) ×3 IMPLANT
DRAPE U-SHAPE 47X51 STRL (DRAPES) ×3 IMPLANT
DRILL BIT 5/64 (BIT) IMPLANT
DRSG MEPILEX BORDER 4X8 (GAUZE/BANDAGES/DRESSINGS) ×3 IMPLANT
ELECT BLADE 4.0 EZ CLEAN MEGAD (MISCELLANEOUS)
ELECT REM PT RETURN 9FT ADLT (ELECTROSURGICAL) ×3
ELECTRODE BLDE 4.0 EZ CLN MEGD (MISCELLANEOUS) IMPLANT
ELECTRODE REM PT RTRN 9FT ADLT (ELECTROSURGICAL) ×1 IMPLANT
EVACUATOR 1/8 PVC DRAIN (DRAIN) IMPLANT
GLENOID ANCHOR PEG CROSSLK 48 (Orthopedic Implant) ×2 IMPLANT
GLOVE BIO SURGEON STRL SZ7 (GLOVE) ×3 IMPLANT
GLOVE BIO SURGEON STRL SZ7.5 (GLOVE) ×3 IMPLANT
GLOVE BIOGEL PI IND STRL 7.0 (GLOVE) ×1 IMPLANT
GLOVE BIOGEL PI IND STRL 8 (GLOVE) ×1 IMPLANT
GLOVE BIOGEL PI INDICATOR 7.0 (GLOVE) ×2
GLOVE BIOGEL PI INDICATOR 8 (GLOVE) ×2
GOWN STRL REUS W/ TWL LRG LVL3 (GOWN DISPOSABLE) ×1 IMPLANT
GOWN STRL REUS W/ TWL XL LVL3 (GOWN DISPOSABLE) ×1 IMPLANT
GOWN STRL REUS W/TWL LRG LVL3 (GOWN DISPOSABLE) ×3
GOWN STRL REUS W/TWL XL LVL3 (GOWN DISPOSABLE) ×3
HANDPIECE INTERPULSE COAX TIP (DISPOSABLE) ×3
HEAD AP ECC 48X15 (Orthopedic Implant) ×2 IMPLANT
HEMOSTAT SURGICEL 2X14 (HEMOSTASIS) IMPLANT
HOOD PEEL AWAY FACE SHEILD DIS (HOOD) ×6 IMPLANT
KIT BASIN OR (CUSTOM PROCEDURE TRAY) ×3 IMPLANT
KIT ROOM TURNOVER OR (KITS) ×3 IMPLANT
MANIFOLD NEPTUNE II (INSTRUMENTS) ×3 IMPLANT
NDL HYPO 25GX1X1/2 BEV (NEEDLE) IMPLANT
NDL MAYO TROCAR (NEEDLE) ×1 IMPLANT
NEEDLE HYPO 25GX1X1/2 BEV (NEEDLE) IMPLANT
NEEDLE MAYO TROCAR (NEEDLE) ×3 IMPLANT
NS IRRIG 1000ML POUR BTL (IV SOLUTION) ×3 IMPLANT
PACK SHOULDER (CUSTOM PROCEDURE TRAY) ×3 IMPLANT
PAD ARMBOARD 7.5X6 YLW CONV (MISCELLANEOUS) ×6 IMPLANT
PIN METAGLENE 2.5 (PIN) ×2 IMPLANT
RETRIEVER SUT HEWSON (MISCELLANEOUS) ×2 IMPLANT
SET HNDPC FAN SPRY TIP SCT (DISPOSABLE) ×1 IMPLANT
SLING ARM IMMOBILIZER LRG (SOFTGOODS) ×3 IMPLANT
SLING ARM IMMOBILIZER MED (SOFTGOODS) IMPLANT
SMARTMIX MINI TOWER (MISCELLANEOUS) ×3
SPONGE LAP 18X18 X RAY DECT (DISPOSABLE) ×3 IMPLANT
SPONGE LAP 4X18 X RAY DECT (DISPOSABLE) IMPLANT
STEM GLOBAL AP 12MM (Stem) ×2 IMPLANT
STRIP CLOSURE SKIN 1/2X4 (GAUZE/BANDAGES/DRESSINGS) ×2 IMPLANT
SUCTION FRAZIER TIP 10 FR DISP (SUCTIONS) ×3 IMPLANT
SUPPORT WRAP ARM LG (MISCELLANEOUS) ×3 IMPLANT
SUT ETHIBOND NAB CT1 #1 30IN (SUTURE) ×5 IMPLANT
SUT FIBERWIRE #2 38 T-5 BLUE (SUTURE) ×3
SUT MNCRL AB 4-0 PS2 18 (SUTURE) ×3 IMPLANT
SUT SILK 2 0 TIES 17X18 (SUTURE)
SUT SILK 2-0 18XBRD TIE BLK (SUTURE) IMPLANT
SUT VIC AB 0 CTB1 27 (SUTURE) IMPLANT
SUT VIC AB 2-0 CT1 27 (SUTURE) ×3
SUT VIC AB 2-0 CT1 TAPERPNT 27 (SUTURE) ×1 IMPLANT
SUTURE FIBERWR #2 38 T-5 BLUE (SUTURE) ×1 IMPLANT
SYR CONTROL 10ML LL (SYRINGE) IMPLANT
TAPE FIBER 2MM 7IN #2 BLUE (SUTURE) ×6 IMPLANT
TOWEL OR 17X24 6PK STRL BLUE (TOWEL DISPOSABLE) ×3 IMPLANT
TOWEL OR 17X26 10 PK STRL BLUE (TOWEL DISPOSABLE) ×3 IMPLANT
TOWER SMARTMIX MINI (MISCELLANEOUS) ×1 IMPLANT
WATER STERILE IRR 1000ML POUR (IV SOLUTION) ×3 IMPLANT

## 2014-05-09 NOTE — H&P (Signed)
[name] [name] is an 73 y.o. female.   Chief Complaint: R shoulder pain and dysfunction HPI: Endstage R shoulder osteoarthritis.  Failed conservative management.  Past Medical History  Diagnosis Date  . Hypertension   . Hypothyroidism   . Arthritis   . GERD (gastroesophageal reflux disease)   . Blood transfusion   . Chronic pain   . Bronchitis     finishing rx  now  . Ulcer     hx  . H/O hiatal hernia   . Virus not detected 2010    postop, caught virus in hosp., respiratory symptoms, upon d/c to home, had allergic reaction /w plate size hives - not sure of the cause    . PONV (postoperative nausea and vomiting)     states she had scop patch in past,states she sets off alarms when she is waking up, she "forgets to breathe"   . Sinus infection     recent sinus infection, finished Zpak- 08/16/2013  . Neuromuscular disorder     DDD, radiculopathy- lumbar  . Female bladder prolapse   . Anemia     took iron for a while    Past Surgical History  Procedure Laterality Date  . Hammer toe surgery  4/12    lt foot-2-3  . Appendectomy    . Metatarsal osteotomy  3/12    lt  . Thumb fusion  2008    rt  . Carpal tunnel release      both  . Knee arthroscopy  2007    both  . Back surgery  09,00,10,99    4 back surg  . Orif toe fracture  11/04/2011    Procedure: OPEN REDUCTION INTERNAL FIXATION (ORIF) METATARSAL (TOE) FRACTURE;  Surgeon: Wylene Simmer, MD;  Location: [place];  Service: Orthopedics;  Laterality: Left;  left revision orif 1st metatarsal with autograft from left calcaneous and left 2nd-3rd metatarsal weil osteotomy  . 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 Dilation and curettage of uterus      x2  . Total knee arthroplasty  08/29/2012    Procedure: TOTAL KNEE ARTHROPLASTY;  Surgeon: Hessie Dibble, MD;  Location: [facility];  Service: Orthopedics;  Laterality: Right;  . Other surgical history  05/08/13    hardware removed from left foot  . Bunionectomy  03/02/11 and  11/04/11    left foot x2  . Breast surgery      cyst- benign  . Joint replacement      R- TKA- 2013  . Tubal ligation      Family History  Problem Relation Age of Onset  . Hyperlipidemia Father   . Hypertension Father   . Heart attack Father 68    deceased   Social History:  reports that she has never smoked. She has never used smokeless tobacco. She reports that she drinks about .5 ounces of alcohol per week. She reports that she does not use illicit drugs.  Allergies:  Allergies  Allergen Reactions  . Fish Allergy Swelling    [place]   (fake crab) Face swelled  . Oxycontin [Oxycodone] Hives    10mg  pink pill  . Flexeril [Cyclobenzaprine] Hives  . Red Dye Swelling  . Ciprofloxacin Hives  . Percocet [Oxycodone-Acetaminophen] Nausea Only    Can take oxycodone by itself  . Eggs Or Egg-Derived Products Nausea And Vomiting    Fully cooked eggs OK.  . Latex Other (See Comments)    Blisters.non latex bandaids are  OK, pt. States LATEX - bandaids are what she reacts to  . Lisinopril Hives  . Methocarbamol Hives  . Morphine And Related Nausea Only    Does not work.   . Sulfa Antibiotics Hives    Medications Prior to Admission  Medication Sig Dispense Refill  . Ascorbic Acid (VITAMIN C PO) Take 1,000 mg by mouth daily.       Marland Kitchen aspirin EC 81 MG tablet Take 81 mg by mouth daily.      . Biotin 1000 MCG tablet Take 1,000 mcg by mouth 2 (two) times daily.      . Calcium Carbonate-Vit D-Min (CALCIUM 1200 PO) Take 1,200 mg by mouth daily.       . carvedilol (COREG) 6.25 MG tablet Take 6.25 mg by mouth 2 (two) times daily with a meal.      . diazepam (VALIUM) 2 MG tablet Take 4 mg by mouth every 6 (six) hours as needed (for muscle spasms).       . Flaxseed, Linseed, (FLAX SEED OIL PO) Take 2,000 mg by mouth daily.       . fluticasone (FLONASE) 50 MCG/ACT nasal spray Place 2 sprays into the nose daily as needed (for congestion).      . furosemide (LASIX) 40 MG tablet Take 40 mg by  mouth daily.       Marland Kitchen gabapentin (NEURONTIN) 600 MG tablet Take 600 mg by mouth 3 (three) times daily.       . Glycerin-Polysorbate 80 (REFRESH DRY EYE THERAPY OP) Place 1 drop into both eyes every 6 (six) hours as needed. Dry eyes .      . hydrOXYzine (ATARAX/VISTARIL) 10 MG tablet Take 5 mg by mouth every evening.      Marland Kitchen levothyroxine (SYNTHROID, LEVOTHROID) 75 MCG tablet Take 75 mcg by mouth daily.       Marland Kitchen loratadine (CLARITIN) 10 MG tablet Take 10 mg by mouth daily as needed for allergies.       . methadone (DOLOPHINE) 5 MG tablet Take 5 mg by mouth 4 (four) times daily.      . Multiple Vitamin (MULTIVITAMIN WITH MINERALS) TABS Take 1 tablet by mouth daily.      . Multiple Vitamins-Minerals (PRESERVISION/LUTEIN PO) Take 1 tablet by mouth 2 (two) times daily.      Marland Kitchen omeprazole (PRILOSEC) 20 MG capsule Take 20 mg by mouth at bedtime.       Marland Kitchen oxyCODONE (OXY IR/ROXICODONE) 5 MG immediate release tablet Take 5 mg by mouth every 4 (four) hours as needed for pain.      . pravastatin (PRAVACHOL) 20 MG tablet Take 20 mg by mouth at bedtime.       . senna (SENOKOT) 8.6 MG tablet Take 2-3 tablets by mouth 2 (two) times daily. 2 tablets in the morning, 3 tablets in the evening      . EPINEPHrine (EPI-PEN) 0.3 mg/0.3 mL DEVI Inject 0.3 mg into the muscle once. For various allergic reactions.      . fexofenadine (ALLEGRA) 180 MG tablet Take 180 mg by mouth daily as needed for allergies.         No results found for this or any previous visit (from the past 48 hour(s)). No results found.  Review of Systems  All other systems reviewed and are negative.   Blood pressure 106/50, pulse 79, temperature 99 F (37.2 C), temperature source Oral, resp. rate 20, height 5\' 4"  (1.626 m), weight 87.998 kg (194 lb), SpO2 92.00%.  Physical Exam  Constitutional: She is oriented to person, place, and time. She appears well-developed and well-nourished.  HENT:  Head: Atraumatic.  Eyes: EOM are normal.   Cardiovascular: Intact distal pulses.   Respiratory: Effort normal.  Musculoskeletal:  R shoulder pain with limited ROM. NVID.  Neurological: She is alert and oriented to person, place, and time.  Skin: Skin is warm and dry.  Psychiatric: She has a normal mood and affect.     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  Failed conservative management. Plan R total shoulder replacement Risks / benefits of surgery discussed Consent on chart  NPO for OR Preop antibiotics   Nita Sells 05/09/2014, 7:22 AM

## 2014-05-09 NOTE — Progress Notes (Signed)
Utilization review completed.  

## 2014-05-09 NOTE — Anesthesia Postprocedure Evaluation (Signed)
Anesthesia Post Note  Patient: [name] [name]  Procedure(s) Performed: Procedure(s) (LRB): TOTAL SHOULDER ARTHROPLASTY (Right)  Anesthesia type: general  Patient location: PACU  Post pain: Pain level controlled  Post assessment: Patient's Cardiovascular Status Stable  Last Vitals:  Filed Vitals:   05/09/14 1107  BP: 112/54  Pulse: 78  Temp: 36.4 C  Resp: 16    Post vital signs: Reviewed and stable  Level of consciousness: sedated  Complications: No apparent anesthesia complications

## 2014-05-09 NOTE — Op Note (Signed)
Procedure(s): TOTAL SHOULDER ARTHROPLASTY Procedure Note  [name] [name] female 73 y.o. 05/09/2014  Procedure(s) and Anesthesia Type:    * RIGHT TOTAL SHOULDER ARTHROPLASTY - Choice  Surgeon(s) and Role:    * Nita Sells, MD - Primary   Indications:  73 y.o. female  With endstage right shoulder arthritis. Pain and dysfunction interfered with quality of life and nonoperative treatment with activity modification, NSAIDS and injections failed.     Surgeon: Nita Sells   Assistants: Jeanmarie Hubert PA-C [facility] was present and scrubbed throughout the procedure and was essential in positioning, retraction, exposure, and closure)  Anesthesia: General endotracheal anesthesia with preoperative interscalene block given by the attending anesthesiologist    Procedure Detail  TOTAL SHOULDER ARTHROPLASTY  Findings: DePuy press-fit size 12 proximally porous coated stem with a 48 x 15 eccentric head, 48 cemented anchor peg glenoid   Estimated Blood Loss:  200 mL         Drains: 1 medium hemovac  Blood Given: none          Specimens: none        Complications:  * No complications entered in OR log *         Disposition: PACU - hemodynamically stable.         Condition: stable    Procedure:   The patient was identified in the preoperative holding area where I personally marked the operative extremity after verifying with the patient and consent. She  was taken to the operating room where She was transferred to the   operative table.  The patient received an interscalene block in   the holding area by the attending anesthesiologist.  General anesthesia was induced   in the operating room without complication.  The patient did receive IV  Ancef prior to the commencement of the procedure.  The patient was   placed in the beach-chair position with the back raised about 30   degrees.  The nonoperative extremity and head and neck were carefully   positioned  and padded protecting against neurovascular compromise.  The   left upper extremity was then prepped and draped in the standard sterile   fashion.    The appropriate operative time-out was performed with   Anesthesia, the perioperative staff, as well as myself and we all agreed   that the right side was the correct operative site.  An approximately   10 cm incision was made from the tip of the coracoid to the center point of the   humerus at the level of the axilla.  Dissection was carried down sharply   through subcutaneous tissues and cephalic vein was identified and taken   laterally with the deltoid.  The pectoralis major was taken medially.  The   upper 1 cm of the pectoralis major was released from its attachment on   the humerus.  The clavipectoral fascia was incised just lateral to the   conjoined tendon.  This incision was carried up to but not into the   coracoacromial ligament.  Digital palpation was used to prove   integrity of the axillary nerve which was protected throughout the   procedure.  Musculocutaneous nerve was not palpated in the operative   field.  Conjoined tendon was then retracted gently medially and the   deltoid laterally.  Anterior circumflex humeral vessels were clamped and   coagulated.  The soft tissues overlying the biceps was incised and this   incision was carried across the  transverse humeral ligament to the base   of the coracoid.  The biceps was tenodesed to the soft tissue just above   pectoralis major and the remaining portion of the biceps superiorly was   excised.  An osteotomy was performed at the lesser tuberosity.  Capsule was then   released all the way down to the 6 o'clock position of the humeral head.   The humeral head was then delivered with simultaneous adduction,   extension and external rotation.  All humeral osteophytes were removed   and the anatomic neck of the humerus was marked and cut free hand at   approximately 25 degrees  retroversion within about 3 mm of the cuff   reflection posteriorly.  The head size was estimated to be a 48 medium   offset.  At that point, the humeral head was retracted posteriorly with   a Fukuda retractor.  Remaining portion of the capsule was released at the base of the   coracoid.  The remaining biceps anchor and the entire anterior-inferior   labrum was excised.  The posterior labrum was also excised but the   posterior capsule was not released.  The guidepin was placed bicortically with +0 elevated guide.  The reamer was used to ream to concentric bone with punctate bleeding.  This gave an excellent concentric surface.  The center hole was then drilled for an anchor peg glenoid followed by the three peripheral holes and none of the holes   exited the glenoid wall.  I then pulse irrigated these holes and dried   them with Surgicel.  The three peripheral holes were then   pressurized cemented and the anchor peg glenoid was placed and impacted   with an excellent fit.  The glenoid was a 48 component.  The proximal humerus was then again exposed taking care not to displace the glenoid.    The humerus was then sequentially reamed going from 6 to 12 by 2 mm incriments. The 12 mm reamer was found to have appropriate cortical contact.  A   box osteotome was then used and a 12-mm broach.  The broach handle was   removed and the trial head was placed.   Calcar reamer was used.The eccentric 48 x 15 head fit best.  With the trial implantation of the component, there was   approximately 50% posterior translation with immediate snap back to the   anatomic position.  With forward elevation, there was no tendency   towards posterior subluxation.   The trial was removed and the final implant was prepared on a back table.  The trial was removed and the final implant was prepared on a back table.   Small holes were drilled on both sides of the lesser tuberosity osteotomy, through which 3 Fibertapes were  passed. The implant was then placed through the loop of all 3 Fibertapes and impacted with an excellent press-fit. This achieved excellent anatomic reconstruction of the proximal humerus.  The joint was then copiously irrigated with pulse lavage.  The subscapularis and   lesser tuberosity osteotomy were then repaired using the 3 Fibertapes previously passed.   One #1 Ethibond was placed at the rotator interval just above   the lesser tuberosity. After repair of the lesser tuberosity, a medium   Hemovac was placed out anterolaterally and again copious irrigation was   used.   Skin was closed with 2-0 Vicryl sutures in the deep dermal layer and 4-0 Monocryl in a subcuticular  running fashion.  Sterile dressings were then applied including Steri- Strips, 4x4s, ABDs and tape.  The patient was placed in a sling and allowed to awaken from general anesthesia and taken to the recovery room in stable  condition.      POSTOPERATIVE PLAN:  Early passive range of motion will be allowed with the goal of 30 degrees external rotation and 130 degrees forward elevation.  No internal rotation at this time.  No active motion of the arm until the lesser tuberosity heals.  The patient will likely be kept in the hospital for 1-2 days and then discharged home.

## 2014-05-09 NOTE — Anesthesia Procedure Notes (Addendum)
Anesthesia Regional Block:  Interscalene brachial plexus block  Pre-Anesthetic Checklist: ,, timeout performed, Correct Patient, Correct Site, Correct Laterality, Correct Procedure, Correct Position, site marked, Risks and benefits discussed,  Surgical consent,  Pre-op evaluation,  At surgeon's request and post-op pain management  Laterality: Right  Prep: chloraprep       Needles:  Injection technique: Single-shot  Needle Type: Echogenic Stimulator Needle     Needle Length: 5cm 5 cm Needle Gauge: 22 and 22 G    Additional Needles:  Procedures: ultrasound guided (picture in chart) and nerve stimulator Interscalene brachial plexus block  Nerve Stimulator or Paresthesia:  Response: bicep contraction, 0.45 mA,   Additional Responses:   Narrative:  Start time: 05/09/2014 7:02 AM End time: 05/09/2014 7:12 AM Injection made incrementally with aspirations every 5 mL.  Performed by: Personally  Anesthesiologist: J. Tamela Gammon, MD  Additional Notes: Functioning IV was confirmed and monitors applied.  A 6mm 22ga echogenic arrow stimulator was used. Sterile prep and drape,hand hygiene and sterile gloves were used.Ultrasound guidance: relevant anatomy identified, needle position confirmed, local anesthetic spread visualized around nerve(s)., vascular puncture avoided.  Image printed for medical record.  Negative aspiration and negative test dose prior to incremental administration of local anesthetic. The patient tolerated the procedure well.   Procedure Name: Intubation Date/Time: 05/09/2014 7:34 AM Performed by: Jenne Campus Pre-anesthesia Checklist: Patient identified, Emergency Drugs available, Suction available, Patient being monitored and Timeout performed Patient Re-evaluated:Patient Re-evaluated prior to inductionOxygen Delivery Method: Circle system utilized Preoxygenation: Pre-oxygenation with 100% oxygen Intubation Type: IV induction Ventilation: Mask ventilation without  difficulty Laryngoscope Size: Miller and 2 Grade View: Grade I Tube type: Oral Tube size: 7.0 mm Number of attempts: 1 Airway Equipment and Method: Stylet Placement Confirmation: ETT inserted through vocal cords under direct vision,  positive ETCO2,  CO2 detector and breath sounds checked- equal and bilateral Secured at: 22 cm Tube secured with: Tape Dental Injury: Teeth and Oropharynx as per pre-operative assessment

## 2014-05-09 NOTE — Transfer of Care (Signed)
Immediate Anesthesia Transfer of Care Note  Patient: [name] [name]  Procedure(s) Performed: Procedure(s) with comments: TOTAL SHOULDER ARTHROPLASTY (Right) - Right shoulder arthroplasty  Patient Location: PACU  Anesthesia Type:General  Level of Consciousness: awake, oriented and patient cooperative  Airway & Oxygen Therapy: Patient Spontanous Breathing and Patient connected to nasal cannula oxygen  Post-op Assessment: Report given to PACU RN and Post -op Vital signs reviewed and stable  Post vital signs: Reviewed  Complications: No apparent anesthesia complications

## 2014-05-09 NOTE — Anesthesia Preprocedure Evaluation (Addendum)
Anesthesia Evaluation  Patient identified by MRN, date of birth, ID band Patient awake    Reviewed: Allergy & Precautions, H&P , NPO status , Patient's Chart, lab work & pertinent test results, reviewed documented beta blocker date and time   History of Anesthesia Complications (+) PONV and history of anesthetic complications  Airway Mallampati: II TM Distance: >3 FB Neck ROM: Full    Dental  (+) Teeth Intact, Dental Advisory Given   Pulmonary neg pulmonary ROS,  breath sounds clear to auscultation  Pulmonary exam normal       Cardiovascular hypertension, Pt. on medications and Pt. on home beta blockers Rhythm:Regular Rate:Normal     Neuro/Psych negative neurological ROS  negative psych ROS   GI/Hepatic Neg liver ROS, hiatal hernia, GERD-  Medicated and Controlled,  Endo/Other  Hypothyroidism   Renal/GU negative Renal ROS     Musculoskeletal   Abdominal   Peds  Hematology   Anesthesia Other Findings   Reproductive/Obstetrics negative OB ROS                          Anesthesia Physical Anesthesia Plan  ASA: III  Anesthesia Plan: General   Post-op Pain Management:    Induction: Intravenous  Airway Management Planned: Oral ETT  Additional Equipment:   Intra-op Plan:   Post-operative Plan: Extubation in OR  Informed Consent: I have reviewed the patients History and Physical, chart, labs and discussed the procedure including the risks, benefits and alternatives for the proposed anesthesia with the patient or authorized representative who has indicated his/her understanding and acceptance.   Dental advisory given  Plan Discussed with: CRNA, Anesthesiologist and Surgeon  Anesthesia Plan Comments:         Anesthesia Quick Evaluation

## 2014-05-10 ENCOUNTER — Encounter (HOSPITAL_COMMUNITY): Payer: Self-pay | Admitting: Orthopedic Surgery

## 2014-05-10 LAB — CBC
HEMATOCRIT: 30.4 % — AB (ref 36.0–46.0)
HEMOGLOBIN: 10.2 g/dL — AB (ref 12.0–15.0)
MCH: 31.4 pg (ref 26.0–34.0)
MCHC: 33.6 g/dL (ref 30.0–36.0)
MCV: 93.5 fL (ref 78.0–100.0)
Platelets: 160 10*3/uL (ref 150–400)
RBC: 3.25 MIL/uL — ABNORMAL LOW (ref 3.87–5.11)
RDW: 13.6 % (ref 11.5–15.5)
WBC: 8.3 10*3/uL (ref 4.0–10.5)

## 2014-05-10 LAB — BASIC METABOLIC PANEL
BUN: 12 mg/dL (ref 6–23)
CHLORIDE: 99 meq/L (ref 96–112)
CO2: 27 mEq/L (ref 19–32)
Calcium: 8.8 mg/dL (ref 8.4–10.5)
Creatinine, Ser: 0.71 mg/dL (ref 0.50–1.10)
GFR calc Af Amer: >90 mL/min (ref >90)
GFR calc non Af Amer: 84 mL/min — ABNORMAL LOW (ref >90)
GLUCOSE: 106 mg/dL — AB (ref 70–99)
Potassium: 4 mEq/L (ref 3.7–5.3)
Sodium: 139 mEq/L (ref 137–147)

## 2014-05-10 MED ORDER — OXYCODONE HCL 5 MG PO TABS: ORAL_TABLET | ORAL | Status: DC

## 2014-05-10 NOTE — Discharge Instructions (Signed)
Discharge Instructions after Total Shoulder Arthroplasty ° ° °A sling has been provided for you. Remove the sling 5 times each day to perform motion exercises. After the first 48 to 72 hours, discontinue using the sling. You should use the sling as a protective device, if you are in a crowd.  °Use ice on the shoulder intermittently over the first 48 hours after surgery.  °Pain medication has been prescribed for you.  °Use your medication liberally over the first 48 hours, and then begin to taper your use. You may take Extra Strength Tylenol or Tylenol only in place of the pain pills. DO NOT take ANY nonsteroidal anti-inflammatory pain medications: Advil, Motrin, Ibuprofen, Aleve, Naproxen, or Naprosyn. °Take one aspirin a day for 2 weeks after surgery, unless you have an aspirin sensitivity/allergy or asthma. °You may remove your dressing after two days.  °You may shower 5 days after surgery. The incision CANNOT get wet prior to 5 days. Simply allow the water to wash over the site and then pat dry. Do not rub the incision. Make sure your axilla (armpit) is completely dry after showering.  °Active reaching and lifting are not permitted. You may use the operative arm for activities of daily living that do not require the operative arm to leave the side of the body, such as eating, drinking, bathing, etc.  °Three to 5 times each day you should perform assisted overhead reaching and external rotation (outward turning) exercises with the operative arm. You were taught these exercises prior to discharge. Both exercises should be done with the non-operative arm used as the "therapist arm" while the operative arm remains relaxed. Ten of each exercise should be done three to five times each day. ° ° °Overhead reach is helping to lift your stiff arm up as high as it will go. To stretch your overhead reach, lie flat on your back, relax, and grasp the wrist of the tight shoulder with your opposite hand. Using the power in your  opposite arm, bring the stiff arm up as far as it is comfortable. Start holding it for ten seconds and then work up to where you can hold it for a count of 30. Breathe slowly and deeply while the arm is moved. Repeat this stretch ten times, trying to help the ar up a little higher each time.  ° ° ° °External rotation is turning the arm out to the side while your elbow stays close to your body. External rotation is best stretched while you are lying on your back. Hold a cane, yardstick, broom handle, or dowel in both hands. Bend both elbows to a right angle. Use steady, gentle force from your normal arm to rotate the hand of the stiff shoulder out away from your body. Continue the rotation as far as it will go comfortably, holding it there for a count of 10. Repeat this exercise ten times.  ° ° ° ° °Please call [phone] during normal business hours or [phone] after hours for any problems. Including the following: ° °- excessive redness of the incisions °- drainage for more than 4 days °- fever of more than 101.5 F ° °*Please note that pain medications will not be refilled after hours or on weekends. ° ° ° °

## 2014-05-10 NOTE — Progress Notes (Signed)
   PATIENT ID: Mikiya H Ellegood   1 Day Post-Op Procedure(s) (LRB): TOTAL SHOULDER ARTHROPLASTY (Right)  Subjective: Doing well this am. Sitting up and eating food. Pain 7/10 since block wore off. Trying to get up with pain rx.   Objective:  Filed Vitals:   05/10/14 0408  BP: 121/52  Pulse: 77  Temp: 98.6 F (37 C)  Resp: 16     R UE dressing with spotted dried blood Mild/mod swelling RUE distally Wiggles fingers, distally NVI Activates deltoid  Labs:  No results found for this basename: HGB,  in the last 72 hoursNo results found for this basename: WBC, RBC, HCT, PLT,  in the last 72 hoursNo results found for this basename: NA, K, CL, CO2, BUN, CREATININE, GLUCOSE, CALCIUM,  in the last 72 hours  Assessment and Plan: 1 day s/p R TSA OT today, 130 ff, 30 er passive exercises Continue pain mgmt D/c home if cleared by OT, pain controlled, likely today, okay if we need to keep another night D/c orders in chart, oxycodone 5 mg for home pain control  VTE proph: ASA325mg  BID, SCDs

## 2014-05-10 NOTE — Evaluation (Signed)
Occupational Therapy Evaluation and Discharge Patient Details Name: [name] [name] MRN: [id number] DOB: Sep 22, 1941 Today's Date: 05/10/2014    History of Present Illness s/p R total shoulder arthroplasty   Clinical Impression   Pt presents with decreased strength and limited ROM from above interfering with her ability to perform her ADLs independently. Pt was independent prior to admission and is currently at a min assist level for UB bathing/dressing. Pt was educated on and demonstrated understanding of UB bathing and dressing techniques, shoulder protocol exercises and how to don/doff her sling. Pt will have assistance from family at home for ADLs and therefore no further OT is needed, we will sign off.    Follow Up Recommendations  No OT follow up          Precautions / Restrictions Precautions Precautions: Shoulder Shoulder Interventions: Shoulder sling/immobilizer;Off for dressing/bathing/exercises Precaution Booklet Issued: Yes (comment) Restrictions Weight Bearing Restrictions: Yes RUE Weight Bearing: Non weight bearing      Mobility Bed Mobility Overal bed mobility: Needs Assistance Bed Mobility: Supine to Sit;Sit to Supine     Supine to sit: Modified independent (Device/Increase time) Sit to supine: Modified independent (Device/Increase time)   General bed mobility comments: VC for log rolling technique to assist with bed mobility.  Transfers Overall transfer level: Needs assistance   Transfers: Sit to/from Stand Sit to Stand: Supervision         General transfer comment: VC for NWB status    Balance Overall balance assessment: Needs assistance Sitting-balance support: Feet supported Sitting balance-Leahy Scale: Fair     Standing balance support: No upper extremity supported Standing balance-Leahy Scale: Fair Standing balance comment: Pt able to ambulate without physical assistance but balance unsteady at times, which could have been from her medication that  she had received prior to treatment. (multiple back sx and needs knee sx)                            ADL Overall ADL's : Needs assistance/impaired Eating/Feeding: Set up;Sitting   Grooming: Standing;Modified independent   Upper Body Bathing: Minimal assistance;Cueing for compensatory techniques;Standing   Lower Body Bathing: Min guard;Sit to/from stand   Upper Body Dressing : Minimal assistance;Cueing for sequencing;Cueing for compensatory techniques;Standing   Lower Body Dressing: Sit to/from stand;Cueing for safety;Min guard   Toilet Transfer: BSC;Ambulation;Min guard Toilet Transfer Details (indicate cue type and reason): over toilet if needed for power up to standing Toileting- Clothing Manipulation and Hygiene: Modified independent;Sit to/from stand       Functional mobility during ADLs: Min guard General ADL Comments: Pt required VC for safety and reminder of NWB status. Educated on compensatory strategies for UB bathing and dressing. Pt will have help from family at home for ADLs and has a BSC and shower chair.                Pertinent Vitals/Pain Pt c/o at beginning of treatment of 6/10 with activity. Pt was repositioned in bed with pillow under RUE.                    Exercises   Other Exercises Other Exercises: Educated on shoulder flexion(up to 130 deg.), ER (up to 30 deg.), elbow, wrist and hand AROM exercises. Pt demonstrated shoulder flexion (40 deg.), ER (10 deg.) and elbow, wrist and hand AROM exercises.  Pt will do 10 reps, 5x day of each and was given handout with protocol and pictures/description of  exercises.                         OT Goals(Current goals can be found in the care plan section) Acute Rehab OT Goals Patient Stated Goal: to go home OT Goal Formulation: With patient Time For Goal Achievement: 05/17/14 Potential to Achieve Goals: Good                End of Session    Activity Tolerance: Patient tolerated  treatment well Patient left: in bed;with call bell/phone within reach;with family/visitor present   Time:  -    Charges:    G-Codes:    [name] [name] May 15, 2014, 4:33 PM

## 2014-05-10 NOTE — Evaluation (Signed)
I have read and agree with this note.   Time in/out:10:08-11:05 Total time: 57 minutes (EV, [place], 1TE)  [place], OTR/L [phone]

## 2014-05-11 NOTE — Discharge Summary (Signed)
Physician Discharge Summary  Patient ID: GENEVIEVE ARBAUGH MRN: [id number] DOB/AGE: Sep 21, 1941 73 y.o.  Admit date: 05/09/2014 Discharge date: 05/11/2014  Admission Diagnoses:  Left shoulder arthritis  Discharge Diagnoses:  Active Problems:   Glenohumeral arthritis   Past Medical History  Diagnosis Date  . Hypertension   . Hypothyroidism   . Arthritis   . GERD (gastroesophageal reflux disease)   . Blood transfusion   . Chronic pain   . Bronchitis     finishing rx  now  . Ulcer     hx  . H/O hiatal hernia   . Virus not detected 2010    postop, caught virus in hosp., respiratory symptoms, upon d/c to home, had allergic reaction /w plate size hives - not sure of the cause    . PONV (postoperative nausea and vomiting)     states she had scop patch in past,states she sets off alarms when she is waking up, she "forgets to breathe"   . Sinus infection     recent sinus infection, finished Zpak- 08/16/2013  . Neuromuscular disorder     DDD, radiculopathy- lumbar  . Female bladder prolapse   . Anemia     took iron for a while    Surgeries: Procedure(s): TOTAL SHOULDER ARTHROPLASTY on 05/09/2014   Consultants (if any):    Discharged Condition: Improved  Hospital Course: TERRICA DUECKER is an 73 y.o. female who was admitted 05/09/2014 with a diagnosis of left shoulder glenohumeral arthritis and went to the operating room on 05/09/2014 and underwent the above named procedures.  She was kept overnight 05-10-14 for additional pain control, and was doing well am 05-11-14, tol PO diet and with reasonable pain control.  She was given perioperative antibiotics:  Anti-infectives   Start     Dose/Rate Route Frequency Ordered Stop   05/09/14 1330  ceFAZolin (ANCEF) IVPB 2 g/50 mL premix     2 g 100 mL/hr over 30 Minutes Intravenous Every 6 hours 05/09/14 1103 05/10/14 0152   05/09/14 0600  ceFAZolin (ANCEF) IVPB 2 g/50 mL premix     2 g 100 mL/hr over 30 Minutes Intravenous On call to O.R. 05/08/14 1357  05/09/14 0735    .  She was given sequential compression devices, early ambulation for DVT prophylaxis.  She benefited maximally from the hospital stay and there were no complications.    Recent vital signs:  Filed Vitals:   05/11/14 0556  BP: 127/67  Pulse: 108  Temp: 98.9 F (37.2 C)  Resp: 18    Recent laboratory studies:  Lab Results  Component Value Date   HGB 10.2* 05/10/2014   HGB 12.5 05/03/2014   HGB 10.3* 08/28/2013   Lab Results  Component Value Date   WBC 8.3 05/10/2014   PLT 160 05/10/2014   Lab Results  Component Value Date   INR 1.01 05/03/2014   Lab Results  Component Value Date   NA 139 05/10/2014   K 4.0 05/10/2014   CL 99 05/10/2014   CO2 27 05/10/2014   BUN 12 05/10/2014   CREATININE 0.71 05/10/2014   GLUCOSE 106* 05/10/2014    Discharge Medications:     Medication List         aspirin EC 81 MG tablet  Take 81 mg by mouth daily.     Biotin 1000 MCG tablet  Take 1,000 mcg by mouth 2 (two) times daily.     CALCIUM 1200 PO  Take 1,200 mg by mouth daily.  carvedilol 6.25 MG tablet  Commonly known as:  COREG  Take 6.25 mg by mouth 2 (two) times daily with a meal.     diazepam 2 MG tablet  Commonly known as:  VALIUM  Take 4 mg by mouth every 6 (six) hours as needed (for muscle spasms).     EPINEPHrine 0.3 mg/0.3 mL Devi  Commonly known as:  EPI-PEN  Inject 0.3 mg into the muscle once. For various allergic reactions.     fexofenadine 180 MG tablet  Commonly known as:  ALLEGRA  Take 180 mg by mouth daily as needed for allergies.     FLAX SEED OIL PO  Take 2,000 mg by mouth daily.     fluticasone 50 MCG/ACT nasal spray  Commonly known as:  FLONASE  Place 2 sprays into the nose daily as needed (for congestion).     furosemide 40 MG tablet  Commonly known as:  LASIX  Take 40 mg by mouth daily.     gabapentin 600 MG tablet  Commonly known as:  NEURONTIN  Take 600 mg by mouth 3 (three) times daily.     hydrOXYzine 10 MG tablet  Commonly  known as:  ATARAX/VISTARIL  Take 5 mg by mouth every evening.     levothyroxine 75 MCG tablet  Commonly known as:  SYNTHROID, LEVOTHROID  Take 75 mcg by mouth daily.     loratadine 10 MG tablet  Commonly known as:  CLARITIN  Take 10 mg by mouth daily as needed for allergies.     methadone 5 MG tablet  Commonly known as:  DOLOPHINE  Take 5 mg by mouth 4 (four) times daily.     multivitamin with minerals Tabs tablet  Take 1 tablet by mouth daily.     omeprazole 20 MG capsule  Commonly known as:  PRILOSEC  Take 20 mg by mouth at bedtime.     oxyCODONE 5 MG immediate release tablet  Commonly known as:  ROXICODONE  1-2 po q 4-6 hrs prn pain     pravastatin 20 MG tablet  Commonly known as:  PRAVACHOL  Take 20 mg by mouth at bedtime.     PRESERVISION/LUTEIN PO  Take 1 tablet by mouth 2 (two) times daily.     REFRESH DRY EYE THERAPY OP  Place 1 drop into both eyes every 6 (six) hours as needed. Dry eyes .     senna 8.6 MG tablet  Commonly known as:  SENOKOT  Take 2-3 tablets by mouth 2 (two) times daily. 2 tablets in the morning, 3 tablets in the evening     VITAMIN C PO  Take 1,000 mg by mouth daily.        Diagnostic Studies: Dg Shoulder Right Port  05/16/14   CLINICAL DATA:  Shoulder arthroplasty  EXAM: PORTABLE RIGHT SHOULDER -1 VIEW  COMPARISON:  None.  FINDINGS: Frontal view was obtained. There is a total shoulder arthroplasty with the prosthetic component appearing well-seated. No acute fracture or dislocation.  IMPRESSION: Prosthetic component well-seated.   Electronically Signed   By: Lowella Grip M.D.   On: 05/16/14 10:29    Disposition: 06-Home-Health Care Svc      Discharge Instructions   Call MD / Call 911    Complete by:  As directed   If you experience chest pain or shortness of breath, CALL 911 and be transported to the hospital emergency room.  If you develope a fever above 101 F, pus (white drainage) or increased drainage  or redness at the  wound, or calf pain, call your surgeon's office.     Constipation Prevention    Complete by:  As directed   Drink plenty of fluids.  Prune juice may be helpful.  You may use a stool softener, such as Colace (over the counter) 100 mg twice a day.  Use MiraLax (over the counter) for constipation as needed.     Diet - low sodium heart healthy    Complete by:  As directed      Increase activity slowly as tolerated    Complete by:  As directed            Follow-up Information   Follow up with Nita Sells, MD. Schedule an appointment as soon as possible for a visit on 05/28/2014.   Specialty:  Orthopedic Surgery   Contact information:   [address] [place] [id number] [phone]        Signed: Jolyn Nap 05/11/2014, 9:08 AM

## 2014-05-27 DIAGNOSIS — M19019 Primary osteoarthritis, unspecified shoulder: Secondary | ICD-10-CM | POA: Diagnosis not present

## 2014-05-31 DIAGNOSIS — H353190 Nonexudative age-related macular degeneration, unspecified eye, stage unspecified: Secondary | ICD-10-CM | POA: Diagnosis not present

## 2014-05-31 DIAGNOSIS — H353290 Exudative age-related macular degeneration, unspecified eye, stage unspecified: Secondary | ICD-10-CM | POA: Diagnosis not present

## 2014-06-03 DIAGNOSIS — M47812 Spondylosis without myelopathy or radiculopathy, cervical region: Secondary | ICD-10-CM | POA: Diagnosis not present

## 2014-06-03 DIAGNOSIS — M25519 Pain in unspecified shoulder: Secondary | ICD-10-CM | POA: Diagnosis not present

## 2014-06-03 DIAGNOSIS — M5137 Other intervertebral disc degeneration, lumbosacral region: Secondary | ICD-10-CM | POA: Diagnosis not present

## 2014-06-24 DIAGNOSIS — M19019 Primary osteoarthritis, unspecified shoulder: Secondary | ICD-10-CM | POA: Diagnosis not present

## 2014-07-09 DIAGNOSIS — M25619 Stiffness of unspecified shoulder, not elsewhere classified: Secondary | ICD-10-CM | POA: Diagnosis not present

## 2014-07-09 DIAGNOSIS — R293 Abnormal posture: Secondary | ICD-10-CM | POA: Diagnosis not present

## 2014-07-11 DIAGNOSIS — M25519 Pain in unspecified shoulder: Secondary | ICD-10-CM | POA: Diagnosis not present

## 2014-07-11 DIAGNOSIS — R293 Abnormal posture: Secondary | ICD-10-CM | POA: Diagnosis not present

## 2014-07-11 DIAGNOSIS — M25619 Stiffness of unspecified shoulder, not elsewhere classified: Secondary | ICD-10-CM | POA: Diagnosis not present

## 2014-07-16 DIAGNOSIS — R293 Abnormal posture: Secondary | ICD-10-CM | POA: Diagnosis not present

## 2014-07-16 DIAGNOSIS — M25619 Stiffness of unspecified shoulder, not elsewhere classified: Secondary | ICD-10-CM | POA: Diagnosis not present

## 2014-07-16 DIAGNOSIS — M25519 Pain in unspecified shoulder: Secondary | ICD-10-CM | POA: Diagnosis not present

## 2014-07-17 DIAGNOSIS — M25519 Pain in unspecified shoulder: Secondary | ICD-10-CM | POA: Diagnosis not present

## 2014-07-17 DIAGNOSIS — R293 Abnormal posture: Secondary | ICD-10-CM | POA: Diagnosis not present

## 2014-07-17 DIAGNOSIS — M25619 Stiffness of unspecified shoulder, not elsewhere classified: Secondary | ICD-10-CM | POA: Diagnosis not present

## 2014-07-22 DIAGNOSIS — M25519 Pain in unspecified shoulder: Secondary | ICD-10-CM | POA: Diagnosis not present

## 2014-07-22 DIAGNOSIS — R293 Abnormal posture: Secondary | ICD-10-CM | POA: Diagnosis not present

## 2014-07-22 DIAGNOSIS — M25619 Stiffness of unspecified shoulder, not elsewhere classified: Secondary | ICD-10-CM | POA: Diagnosis not present

## 2014-07-24 DIAGNOSIS — M25619 Stiffness of unspecified shoulder, not elsewhere classified: Secondary | ICD-10-CM | POA: Diagnosis not present

## 2014-07-24 DIAGNOSIS — R293 Abnormal posture: Secondary | ICD-10-CM | POA: Diagnosis not present

## 2014-07-24 DIAGNOSIS — M25519 Pain in unspecified shoulder: Secondary | ICD-10-CM | POA: Diagnosis not present

## 2014-07-26 DIAGNOSIS — M25519 Pain in unspecified shoulder: Secondary | ICD-10-CM | POA: Diagnosis not present

## 2014-07-26 DIAGNOSIS — M25619 Stiffness of unspecified shoulder, not elsewhere classified: Secondary | ICD-10-CM | POA: Diagnosis not present

## 2014-07-26 DIAGNOSIS — M549 Dorsalgia, unspecified: Secondary | ICD-10-CM | POA: Diagnosis not present

## 2014-07-26 DIAGNOSIS — R293 Abnormal posture: Secondary | ICD-10-CM | POA: Diagnosis not present

## 2014-07-26 DIAGNOSIS — Z6833 Body mass index (BMI) 33.0-33.9, adult: Secondary | ICD-10-CM | POA: Diagnosis not present

## 2014-07-29 DIAGNOSIS — M25569 Pain in unspecified knee: Secondary | ICD-10-CM | POA: Diagnosis not present

## 2014-07-29 DIAGNOSIS — M25519 Pain in unspecified shoulder: Secondary | ICD-10-CM | POA: Diagnosis not present

## 2014-07-29 DIAGNOSIS — R293 Abnormal posture: Secondary | ICD-10-CM | POA: Diagnosis not present

## 2014-07-29 DIAGNOSIS — M25619 Stiffness of unspecified shoulder, not elsewhere classified: Secondary | ICD-10-CM | POA: Diagnosis not present

## 2014-07-29 DIAGNOSIS — M961 Postlaminectomy syndrome, not elsewhere classified: Secondary | ICD-10-CM | POA: Diagnosis not present

## 2014-07-31 DIAGNOSIS — M25519 Pain in unspecified shoulder: Secondary | ICD-10-CM | POA: Diagnosis not present

## 2014-07-31 DIAGNOSIS — M25619 Stiffness of unspecified shoulder, not elsewhere classified: Secondary | ICD-10-CM | POA: Diagnosis not present

## 2014-07-31 DIAGNOSIS — R293 Abnormal posture: Secondary | ICD-10-CM | POA: Diagnosis not present

## 2014-08-02 DIAGNOSIS — R293 Abnormal posture: Secondary | ICD-10-CM | POA: Diagnosis not present

## 2014-08-02 DIAGNOSIS — M1710 Unilateral primary osteoarthritis, unspecified knee: Secondary | ICD-10-CM | POA: Diagnosis not present

## 2014-08-02 DIAGNOSIS — M25619 Stiffness of unspecified shoulder, not elsewhere classified: Secondary | ICD-10-CM | POA: Diagnosis not present

## 2014-08-02 DIAGNOSIS — M25519 Pain in unspecified shoulder: Secondary | ICD-10-CM | POA: Diagnosis not present

## 2014-08-02 DIAGNOSIS — M25569 Pain in unspecified knee: Secondary | ICD-10-CM | POA: Diagnosis not present

## 2014-08-05 DIAGNOSIS — M6530 Trigger finger, unspecified finger: Secondary | ICD-10-CM | POA: Diagnosis not present

## 2014-08-05 DIAGNOSIS — R293 Abnormal posture: Secondary | ICD-10-CM | POA: Diagnosis not present

## 2014-08-05 DIAGNOSIS — M25619 Stiffness of unspecified shoulder, not elsewhere classified: Secondary | ICD-10-CM | POA: Diagnosis not present

## 2014-08-05 DIAGNOSIS — M25519 Pain in unspecified shoulder: Secondary | ICD-10-CM | POA: Diagnosis not present

## 2014-08-08 DIAGNOSIS — M25619 Stiffness of unspecified shoulder, not elsewhere classified: Secondary | ICD-10-CM | POA: Diagnosis not present

## 2014-08-08 DIAGNOSIS — M25519 Pain in unspecified shoulder: Secondary | ICD-10-CM | POA: Diagnosis not present

## 2014-08-08 DIAGNOSIS — R293 Abnormal posture: Secondary | ICD-10-CM | POA: Diagnosis not present

## 2014-08-09 DIAGNOSIS — R293 Abnormal posture: Secondary | ICD-10-CM | POA: Diagnosis not present

## 2014-08-09 DIAGNOSIS — M25619 Stiffness of unspecified shoulder, not elsewhere classified: Secondary | ICD-10-CM | POA: Diagnosis not present

## 2014-08-09 DIAGNOSIS — M25519 Pain in unspecified shoulder: Secondary | ICD-10-CM | POA: Diagnosis not present

## 2014-08-14 DIAGNOSIS — M25619 Stiffness of unspecified shoulder, not elsewhere classified: Secondary | ICD-10-CM | POA: Diagnosis not present

## 2014-08-14 DIAGNOSIS — M25519 Pain in unspecified shoulder: Secondary | ICD-10-CM | POA: Diagnosis not present

## 2014-08-14 DIAGNOSIS — R293 Abnormal posture: Secondary | ICD-10-CM | POA: Diagnosis not present

## 2014-08-15 DIAGNOSIS — M25519 Pain in unspecified shoulder: Secondary | ICD-10-CM | POA: Diagnosis not present

## 2014-08-15 DIAGNOSIS — R293 Abnormal posture: Secondary | ICD-10-CM | POA: Diagnosis not present

## 2014-08-15 DIAGNOSIS — M25619 Stiffness of unspecified shoulder, not elsewhere classified: Secondary | ICD-10-CM | POA: Diagnosis not present

## 2014-08-16 ENCOUNTER — Encounter: Payer: Self-pay | Admitting: Obstetrics & Gynecology

## 2014-08-16 ENCOUNTER — Ambulatory Visit (INDEPENDENT_AMBULATORY_CARE_PROVIDER_SITE_OTHER): Payer: Medicare Other | Admitting: Obstetrics & Gynecology

## 2014-08-16 ENCOUNTER — Other Ambulatory Visit: Payer: Self-pay

## 2014-08-16 VITALS — BP 132/62 | HR 68 | Resp 18 | Ht 63.25 in | Wt 192.0 lb

## 2014-08-16 DIAGNOSIS — N8111 Cystocele, midline: Secondary | ICD-10-CM | POA: Diagnosis not present

## 2014-08-16 DIAGNOSIS — M25519 Pain in unspecified shoulder: Secondary | ICD-10-CM | POA: Diagnosis not present

## 2014-08-16 DIAGNOSIS — R293 Abnormal posture: Secondary | ICD-10-CM | POA: Diagnosis not present

## 2014-08-16 DIAGNOSIS — Z01419 Encounter for gynecological examination (general) (routine) without abnormal findings: Secondary | ICD-10-CM | POA: Diagnosis not present

## 2014-08-16 DIAGNOSIS — IMO0002 Reserved for concepts with insufficient information to code with codable children: Secondary | ICD-10-CM | POA: Insufficient documentation

## 2014-08-16 DIAGNOSIS — N8110 Cystocele, unspecified: Secondary | ICD-10-CM | POA: Insufficient documentation

## 2014-08-16 DIAGNOSIS — Z124 Encounter for screening for malignant neoplasm of cervix: Secondary | ICD-10-CM | POA: Diagnosis not present

## 2014-08-16 DIAGNOSIS — Z1231 Encounter for screening mammogram for malignant neoplasm of breast: Secondary | ICD-10-CM

## 2014-08-16 DIAGNOSIS — M25619 Stiffness of unspecified shoulder, not elsewhere classified: Secondary | ICD-10-CM | POA: Diagnosis not present

## 2014-08-16 NOTE — Progress Notes (Addendum)
73 y.o. G7P2 MarriedCaucasianF here for annual exam.  Had left should replacement.  Doing well.  Has been in TP now four weeks.  Has several more weeks of PT left.  Mobility is improving.    Feeling much more pressure from cystocele.  No infection-like symptoms.  Just pressure due to bulge.  No LMP recorded. Patient is postmenopausal.          Sexually active: Yes.    The current method of family planning is post menopausal status.    Exercising: Yes.    Walking - Not very far currently  Smoker:  no  Health Maintenance: Pap: 7/14 Normal History of abnormal Pap:  no MMG:  06/2013 BIRADS1: neg. Has scheduled 9/21 Colonoscopy:  2006 - repeat 10 years, Dr. Lajoyce Corners BMD:   03/2010 -0.7/0-0.9 forearm TDaP:  8 years ago?  Pt feels pretty sure it is up to date Screening Labs: 05/10/14, Hb: 05/10/14 = 10.2, Urin : 05/03/14=Normal   reports that she has never smoked. She has never used smokeless tobacco. She reports that she drinks about .5 ounces of alcohol per week. She reports that she does not use illicit drugs.  Past Medical History  Diagnosis Date  . Hypertension   . Hypothyroidism   . Arthritis   . GERD (gastroesophageal reflux disease)   . Blood transfusion   . Chronic pain   . Bronchitis     finishing rx  now  . Ulcer     hx  . H/O hiatal hernia   . Virus not detected 2010    postop, caught virus in hosp., respiratory symptoms, upon d/c to home, had allergic reaction /w plate size hives - not sure of the cause    . PONV (postoperative nausea and vomiting)     states she had scop patch in past,states she sets off alarms when she is waking up, she "forgets to breathe"   . Sinus infection     recent sinus infection, finished Zpak- 08/16/2013  . Neuromuscular disorder     DDD, radiculopathy- lumbar  . Female bladder prolapse   . Anemia     took iron for a while    Past Surgical History  Procedure Laterality Date  . Hammer toe surgery  4/12    lt foot-2-3  . Appendectomy    .  Metatarsal osteotomy  3/12    lt  . Thumb fusion  2008    rt  . Carpal tunnel release      both  . Knee arthroscopy  2007    both  . Back surgery  09,00,10,99, 6/14    Spinal Fusin L4-L5, Lumbar Fusion L1-S1  . Orif toe fracture  11/04/2011    Procedure: OPEN REDUCTION INTERNAL FIXATION (ORIF) METATARSAL (TOE) FRACTURE;  Surgeon: Wylene Simmer, MD;  Location: [facility];  Service: Orthopedics;  Laterality: Left;  left revision orif 1st metatarsal with autograft from left calcaneous and left 2nd-3rd metatarsal weil osteotomy  . 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 Dilation and curettage of uterus      x2  . Total knee arthroplasty  08/29/2012    Procedure: TOTAL KNEE ARTHROPLASTY;  Surgeon: Hessie Dibble, MD;  Location: [facility];  Service: Orthopedics;  Laterality: Right;  . Other surgical history  05/08/13    hardware removed from left foot  . Bunionectomy  03/02/11 and 11/04/11    left foot x2  . Breast surgery      cyst-  benign  . Joint replacement      R- TKA- 2013  . Tubal ligation    . Total shoulder arthroplasty Right 05/09/2014    dr Tamera Punt  . Total shoulder arthroplasty Right 05/09/2014    Procedure: TOTAL SHOULDER ARTHROPLASTY;  Surgeon: Nita Sells, MD;  Location: [facility];  Service: Orthopedics;  Laterality: Right;  Right shoulder arthroplasty  . Lumbar laminectomy  6/200    L4-L5    Current Outpatient Prescriptions  Medication Sig Dispense Refill  . Ascorbic Acid (VITAMIN C PO) Take 1,000 mg by mouth daily.       Marland Kitchen aspirin EC 81 MG tablet Take 81 mg by mouth daily.      . Biotin 1000 MCG tablet Take 1,000 mcg by mouth 2 (two) times daily.      . Calcium Carbonate-Vit D-Min (CALCIUM 1200 PO) Take 1,200 mg by mouth daily.       . carvedilol (COREG) 6.25 MG tablet Take 6.25 mg by mouth 2 (two) times daily with a meal.      . Flaxseed, Linseed, (FLAX SEED OIL PO) Take 2,000 mg by mouth daily.       . furosemide (LASIX) 40 MG tablet Take 40 mg by mouth  daily.       Marland Kitchen gabapentin (NEURONTIN) 600 MG tablet Take 600 mg by mouth 3 (three) times daily.       . Glycerin-Polysorbate 80 (REFRESH DRY EYE THERAPY OP) Place 1 drop into both eyes every 6 (six) hours as needed. Dry eyes .      . levothyroxine (SYNTHROID, LEVOTHROID) 75 MCG tablet Take 75 mcg by mouth daily.       Marland Kitchen loratadine (CLARITIN) 10 MG tablet Take 10 mg by mouth daily as needed for allergies.       . methadone (DOLOPHINE) 5 MG tablet Take 5 mg by mouth 4 (four) times daily.      . Multiple Vitamin (MULTIVITAMIN WITH MINERALS) TABS Take 1 tablet by mouth daily.      . Multiple Vitamins-Minerals (PRESERVISION/LUTEIN PO) Take 1 tablet by mouth 2 (two) times daily.      Marland Kitchen omeprazole (PRILOSEC) 20 MG capsule Take 20 mg by mouth at bedtime.       . pravastatin (PRAVACHOL) 20 MG tablet Take 20 mg by mouth at bedtime.       . senna (SENOKOT) 8.6 MG tablet Take 2-3 tablets by mouth 2 (two) times daily. 2 tablets in the morning, 3 tablets in the evening      . amoxicillin (AMOXIL) 500 MG capsule       . diazepam (VALIUM) 2 MG tablet Take 4 mg by mouth every 6 (six) hours as needed (for muscle spasms).       . EPINEPHrine (EPI-PEN) 0.3 mg/0.3 mL DEVI Inject 0.3 mg into the muscle once. For various allergic reactions.      . fluticasone (FLONASE) 50 MCG/ACT nasal spray Place 2 sprays into the nose daily as needed (for congestion).       No current facility-administered medications for this visit.    Family History  Problem Relation Age of Onset  . Hyperlipidemia Father   . Hypertension Father   . Heart attack Father 63    deceased    ROS:  Pertinent items are noted in HPI.  Otherwise, a comprehensive ROS was negative.  Exam:   BP 132/62  Pulse 68  Resp 18  Ht 5' 3.25" (1.607 m)  Wt 192 lb (87.091  kg)  BMI 33.72 kg/m2  Height: 5' 3.25" (160.7 cm)  Ht Readings from Last 3 Encounters:  08/16/14 5' 3.25" (1.607 m)  05/09/14 5\' 4"  (1.626 m)  05/09/14 5\' 4"  (1.626 m)    General  appearance: alert, cooperative and appears stated age Head: Normocephalic, without obvious abnormality, atraumatic Neck: no adenopathy, supple, symmetrical, trachea midline and thyroid normal to inspection and palpation Lungs: clear to auscultation bilaterally Breasts: normal appearance, no masses or tenderness Heart: regular rate and rhythm Abdomen: soft, non-tender; bowel sounds normal; no masses,  no organomegaly Extremities: extremities normal, atraumatic, no cyanosis or edema Skin: Skin color, texture, turgor normal. No rashes or lesions Lymph nodes: Cervical, supraclavicular, and axillary nodes normal. No abnormal inguinal nodes palpated Neurologic: Grossly normal   Pelvic: External genitalia:  no lesions              Urethra:  normal appearing urethra with no masses, tenderness or lesions              Bartholins and Skenes: normal                 Vagina: normal appearing vagina with normal color and discharge, no lesions, 4th degree cystocele              Cervix: no lesions              Pap taken: no Bimanual Exam:  Uterus:  normal size, contour, position, consistency, mobility, non-tender              Adnexa: normal adnexa and no mass, fullness, tenderness               Rectovaginal: Confirms               Anus:  normal sphincter tone, no lesions  A:  Well Woman with normal exam  Hypertension  Elevated lipids  Chronic back pain with history of multiple surgeries. Large cystocele, worsened  P: Mammogram scheduled 9/23.  H/O breast necrosis noted Normal pap 7/14.  No pap smear obtained.  Dr. Sharlett Iles follows labs.  Consult with Dr. Quincy Simmonds.  Pt aware will need hysterectomy at same time.  Probably will need urodynamics as well.   return annually or prn An After Visit Summary was printed and given to the patient.

## 2014-08-19 DIAGNOSIS — M25619 Stiffness of unspecified shoulder, not elsewhere classified: Secondary | ICD-10-CM | POA: Diagnosis not present

## 2014-08-19 DIAGNOSIS — R293 Abnormal posture: Secondary | ICD-10-CM | POA: Diagnosis not present

## 2014-08-19 DIAGNOSIS — M25519 Pain in unspecified shoulder: Secondary | ICD-10-CM | POA: Diagnosis not present

## 2014-08-21 DIAGNOSIS — M25619 Stiffness of unspecified shoulder, not elsewhere classified: Secondary | ICD-10-CM | POA: Diagnosis not present

## 2014-08-21 DIAGNOSIS — M25519 Pain in unspecified shoulder: Secondary | ICD-10-CM | POA: Diagnosis not present

## 2014-08-21 DIAGNOSIS — R293 Abnormal posture: Secondary | ICD-10-CM | POA: Diagnosis not present

## 2014-08-23 ENCOUNTER — Encounter: Payer: Self-pay | Admitting: Obstetrics and Gynecology

## 2014-08-23 ENCOUNTER — Ambulatory Visit (INDEPENDENT_AMBULATORY_CARE_PROVIDER_SITE_OTHER): Payer: Medicare Other | Admitting: Obstetrics and Gynecology

## 2014-08-23 VITALS — BP 122/60 | HR 72 | Ht 63.25 in | Wt 194.0 lb

## 2014-08-23 DIAGNOSIS — M25619 Stiffness of unspecified shoulder, not elsewhere classified: Secondary | ICD-10-CM | POA: Diagnosis not present

## 2014-08-23 DIAGNOSIS — M25419 Effusion, unspecified shoulder: Secondary | ICD-10-CM | POA: Diagnosis not present

## 2014-08-23 DIAGNOSIS — M25519 Pain in unspecified shoulder: Secondary | ICD-10-CM | POA: Diagnosis not present

## 2014-08-23 DIAGNOSIS — N813 Complete uterovaginal prolapse: Secondary | ICD-10-CM

## 2014-08-23 DIAGNOSIS — R293 Abnormal posture: Secondary | ICD-10-CM | POA: Diagnosis not present

## 2014-08-23 NOTE — Progress Notes (Signed)
Spoke with patient and urodynamics instructions given. Scheduled urodynamics procedure for 09/04/14 at  Advised to stop all bladder medications one week prior to procedure, patient states she is not on any medications. Arrive with comfortably full bladder.  Advised will need pre procedure UA to check for any infection prior. Scheduled for  08/30/14 at 10:30 Scheduled follow up with Dr. Quincy Simmonds  for 09/09/14 at 0800.  Brief description of procedure given.  Patient verbalizes understanding of instructions and agreeable to appointments as scheduled.

## 2014-08-23 NOTE — Progress Notes (Signed)
GYNECOLOGY VISIT  PCP:  Leanna Battles  Referring provider:  Dr. Sabra Heck  HPI: 73 y.o.   Married  Caucasian  female   G42P2 with No LMP recorded. Patient is postmenopausal.   here for   Consult on Cystocele Progressive prolapse and leakage of urine for a long time.  Cannot get to the bathroom on time.  No leakage with laugh, cough, or sneeze.  Did not have this in the past. DF - every 2 hours unless has taken diuretic, then every 10 minutes.  NF - never.  No enuresis.   No dysuria or hematuria.  One UTI following back surgery, otherwise none.  No history of pyelonephritis or stones.  History of renal cyst discovered during a lumbar MRI.   Not voiding well or completely. Double voiding.  Does suprapubic pressure to void.   Wore three pessaries in the past. Worked for a while and then developed vaginal discharge so stopped.   No difficulty passing stool. Occasionally does digital manipulation to have bowel movements.  No fecal incontinence.  Takes methadone 5 mg po qid.  Has had 6 spinal surgery and has a lot os scar tissue and neuropathic pain.  Forgets to take medication at times now.   Status post BTL. Status post appy.  Had spinal fusion one year ago.  2 vaginal deliveries, largest 8 pounds, 4 ounces.   Planning a trip to [place] in the third or fourth week in October.   Hgb:  NA Urine:    GYNECOLOGIC HISTORY: No LMP recorded. Patient is postmenopausal. Sexually active:  yes Partner preference: female Contraception:   none Menopausal hormone therapy: none DES exposure:   no Blood transfusions:no    Sexually transmitted diseases: no   GYN procedures and prior surgeries:  D&C, Tubal ligation,  Breast surgery Last mammogram:  06/2013 Bi-Rads neg, next schedule 9/21               Last pap and high risk HPV testing:   7/14 Wnl, neg Hr HPv History of abnormal pap smear:  no   OB History   Grav Para Term Preterm Abortions TAB SAB Ect Mult Living   9 2        2         LIFESTYLE: Exercise:  Walking about a mile a day             Tobacco: no Alcohol: 1 per week Drug use:  no  Patient Active Problem List   Diagnosis Date Noted  . Cystocele 08/16/2014  . Glenohumeral arthritis 05/09/2014  . 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 Hypertension   . Hypothyroidism   . Arthritis   . GERD (gastroesophageal reflux disease)   . Blood transfusion   . Chronic pain   . Bronchitis     finishing rx  now  . Ulcer     hx  . H/O hiatal hernia   . Virus not detected 2010    postop, caught virus in hosp., respiratory symptoms, upon d/c to home, had allergic reaction /w plate size hives - not sure of the cause    . PONV (postoperative nausea and vomiting)     states she had scop patch in past,states she sets off alarms when she is waking up, she "forgets to breathe"   . Sinus infection     recent sinus infection, finished Zpak- 08/16/2013  . Neuromuscular disorder  DDD, radiculopathy- lumbar  . Female bladder prolapse   . Anemia     took iron for a while    Past Surgical History  Procedure Laterality Date  . Hammer toe surgery  4/12    lt foot-2-3  . Appendectomy    . Metatarsal osteotomy  3/12    lt  . Thumb fusion  2008    rt  . Carpal tunnel release      both  . Knee arthroscopy  2007    both  . Back surgery  09,00,10,99, 6/14    Spinal Fusin L4-L5, Lumbar Fusion L1-S1  . Orif toe fracture  11/04/2011    Procedure: OPEN REDUCTION INTERNAL FIXATION (ORIF) METATARSAL (TOE) FRACTURE;  Surgeon: Wylene Simmer, MD;  Location: [facility];  Service: Orthopedics;  Laterality: Left;  left revision orif 1st metatarsal with autograft from left calcaneous and left 2nd-3rd metatarsal weil osteotomy  . 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 Dilation and curettage of uterus      x2  . Total knee arthroplasty  08/29/2012    Procedure: TOTAL KNEE ARTHROPLASTY;  Surgeon: Hessie Dibble, MD;   Location: [facility];  Service: Orthopedics;  Laterality: Right;  . Other surgical history  05/08/13    hardware removed from left foot  . Bunionectomy  03/02/11 and 11/04/11    left foot x2  . Breast surgery      cyst- benign  . Joint replacement      R- TKA- 2013  . Tubal ligation    . Total shoulder arthroplasty Right 05/09/2014    Procedure: TOTAL SHOULDER ARTHROPLASTY;  Surgeon: Nita Sells, MD;  Location: [facility];  Service: Orthopedics;  Laterality: Right;  Right shoulder arthroplasty  . Lumbar laminectomy  05/1999    L4-L5    Current Outpatient Prescriptions  Medication Sig Dispense Refill  . amoxicillin (AMOXIL) 500 MG capsule Before dental work      . Ascorbic Acid (VITAMIN C PO) Take 1,000 mg by mouth daily.       Marland Kitchen aspirin EC 81 MG tablet Take 81 mg by mouth daily.      . Biotin 1000 MCG tablet Take 1,000 mcg by mouth 2 (two) times daily.      . Calcium Carbonate-Vit D-Min (CALCIUM 1200 PO) Take 1,200 mg by mouth daily.       . carvedilol (COREG) 6.25 MG tablet Take 6.25 mg by mouth 2 (two) times daily with a meal.      . diazepam (VALIUM) 2 MG tablet Take 4 mg by mouth every 6 (six) hours as needed (for muscle spasms).       . EPINEPHrine (EPI-PEN) 0.3 mg/0.3 mL DEVI Inject 0.3 mg into the muscle once. For various allergic reactions.      . Flaxseed, Linseed, (FLAX SEED OIL PO) Take 2,000 mg by mouth daily.       . fluticasone (FLONASE) 50 MCG/ACT nasal spray Place 2 sprays into the nose daily as needed (for congestion).      . furosemide (LASIX) 40 MG tablet Take 40 mg by mouth daily.       Marland Kitchen gabapentin (NEURONTIN) 600 MG tablet Take 600 mg by mouth 3 (three) times daily.       . Glycerin-Polysorbate 80 (REFRESH DRY EYE THERAPY OP) Place 1 drop into both eyes every 6 (six) hours as needed. Dry eyes .      . levothyroxine (SYNTHROID, LEVOTHROID) 75 MCG tablet  Take 75 mcg by mouth daily.       Marland Kitchen loratadine (CLARITIN) 10 MG tablet Take 10 mg by mouth daily as needed for  allergies.       . methadone (DOLOPHINE) 5 MG tablet Take 5 mg by mouth 4 (four) times daily.      . Multiple Vitamin (MULTIVITAMIN WITH MINERALS) TABS Take 1 tablet by mouth daily.      . Multiple Vitamins-Minerals (PRESERVISION/LUTEIN PO) Take 1 tablet by mouth 2 (two) times daily.      Marland Kitchen omeprazole (PRILOSEC) 20 MG capsule Take 20 mg by mouth at bedtime.       . pravastatin (PRAVACHOL) 20 MG tablet Take 20 mg by mouth at bedtime.       . senna (SENOKOT) 8.6 MG tablet Take 2-3 tablets by mouth 2 (two) times daily. 2 tablets in the morning, 3 tablets in the evening       No current facility-administered medications for this visit.     ALLERGIES: Fish allergy; Oxycontin; Flexeril; Red dye; Ciprofloxacin; Tylenol; Eggs or egg-derived products; Latex; Lisinopril; Methocarbamol; Morphine and related; and Sulfa antibiotics  Family History  Problem Relation Age of Onset  . Hyperlipidemia Father   . Hypertension Father   . Heart attack Father 38    deceased    History   Social History  . Marital Status: Married    Spouse Name: N/A    Number of Children: N/A  . Years of Education: N/A   Occupational History  . Not on file.   Social History Main Topics  . Smoking status: Never Smoker   . Smokeless tobacco: Never Used     Comment: occ wine  . Alcohol Use: 0.5 oz/week    1 drink(s) per week     Comment: occas. glass of wine   . Drug Use: No  . Sexual Activity: Yes    Partners: Male    Birth Control/ Protection: Post-menopausal   Other Topics Concern  . Not on file   Social History Narrative  . No narrative on file    ROS:  Pertinent items are noted in HPI.  PHYSICAL EXAMINATION:    BP 122/60  Pulse 72  Ht 5' 3.25" (1.607 m)  Wt 194 lb (87.998 kg)  BMI 34.08 kg/m2   Wt Readings from Last 3 Encounters:  08/23/14 194 lb (87.998 kg)  08/16/14 192 lb (87.091 kg)  05/09/14 194 lb (87.998 kg)     Ht Readings from Last 3 Encounters:  08/23/14 5' 3.25" (1.607 m)   08/16/14 5' 3.25" (1.607 m)  05/09/14 5\' 4"  (1.626 m)    General appearance: alert, cooperative and appears stated age Head: Normocephalic, without obvious abnormality, atraumatic Neck: no adenopathy, supple, symmetrical, trachea midline and thyroid not enlarged, symmetric, no tenderness/mass/nodules Lungs: clear to auscultation bilaterally Heart: regular rate and rhythm Abdomen: right lower quadrant incision, soft, non-tender; no masses,  no organomegaly Extremities: extremities normal, atraumatic, no cyanosis or edema Skin: Skin color, texture, turgor normal. No rashes or lesions Lymph nodes: Cervical, supraclavicular, and axillary nodes normal. No abnormal inguinal nodes palpated Neurologic: Grossly normal  Pelvic: External genitalia:  no lesions              Urethra:  normal appearing urethra with no masses, tenderness or lesions              Bartholins and Skenes: normal  Vagina: normal appearing vagina with normal color and discharge, no lesions, third degree cystocele, almost second degree uterine prolapse, first degree rectocele.              Cervix: normal appearance                 Bimanual Exam:  Uterus:  uterus is normal size, shape, consistency and nontender                                      Adnexa: normal adnexa in size, nontender and no masses                                      Rectovaginal: Confirms                                      Anus:  normal sphincter tone, no lesions  ASSESSMENT  Complete uterovaginal prolapse.  Failed pessary use.  Chronic back pain.  On Methodone.  Status post multiple back surgeries. Multiple allergies.   PLAN    I have had a comprehensive discussion with the patient and her husband regarding prolapse and urinary incontinence.  I have provided reading materials from ACOG regarding prolapse  in general as well as medical and surgical treatment for these conditions. Medical treatments may include physical therapy and  pessary use.   We discussed multiple routes of approach to surgery including: - abdominal hysterectomy with bilateral salpingo-oophorectomy, sacrocolpopexy with permanent graft, anterior and posterior colporrhaphy, and TVT Exact midurethral sling with cystoscopy.  - laparoscopically assisted vaginal approach with laparoscopically assisted vaginal hysterectomy, bilateral salpingo-oophorectomy, anterior and posterior colporrhaphy with possible sacrospinous fixation using native tissue repair or augmentation with bovine or porcine graft, and TVT midurethral sling and cystoscopy.   We discussed benefits and risks of surgery which include but are not limited to bleeding, infection, damage to surrounding organs, ureteral damage, vaginal pain with intercourse, mesh erosion and exposure, dyspareunia, urinary retention and need for prolonged catheterization and/or self catheterization, reoperation, recurrence of prolapse and incontinence,  DVT, PE, death, and reaction to anesthesia.    I have discussed surgical expectations regarding the procedures and success rates, outcomes, and recovery.     Patient wishes to proceed forward with urodynamics and an abdominal approach to surgery.  She declines referral to another provider to have the sacrocolpopexy procedure performed robotically or laparoscopically.   An After Visit Summary was printed and given to the patient.  45 minutes face to face time of which over 50% was spent in counseling.

## 2014-08-23 NOTE — Patient Instructions (Signed)
I will see you for a consultation regarding the urodynamic testing and the details of your surgical care.  This may not be the same day as the testing however.   Thank you!

## 2014-08-26 ENCOUNTER — Ambulatory Visit
Admission: RE | Admit: 2014-08-26 | Discharge: 2014-08-26 | Disposition: A | Discharge status: Home / Self Care | Payer: Medicare Other | Source: Ambulatory Visit

## 2014-08-26 ENCOUNTER — Telehealth: Payer: Self-pay | Admitting: *Deleted

## 2014-08-26 DIAGNOSIS — R293 Abnormal posture: Secondary | ICD-10-CM | POA: Diagnosis not present

## 2014-08-26 DIAGNOSIS — M25519 Pain in unspecified shoulder: Secondary | ICD-10-CM | POA: Diagnosis not present

## 2014-08-26 DIAGNOSIS — Z1231 Encounter for screening mammogram for malignant neoplasm of breast: Secondary | ICD-10-CM

## 2014-08-26 DIAGNOSIS — M25619 Stiffness of unspecified shoulder, not elsewhere classified: Secondary | ICD-10-CM | POA: Diagnosis not present

## 2014-08-26 DIAGNOSIS — M25419 Effusion, unspecified shoulder: Secondary | ICD-10-CM | POA: Diagnosis not present

## 2014-08-26 NOTE — Telephone Encounter (Signed)
Call to patient to check on date preferences. LMTCB.

## 2014-08-27 NOTE — Telephone Encounter (Signed)
Pt would like as soon as possible doesn't not have any preferences. but would like to have a consult with dr Sabra Heck about it first.

## 2014-08-28 DIAGNOSIS — R293 Abnormal posture: Secondary | ICD-10-CM | POA: Diagnosis not present

## 2014-08-28 DIAGNOSIS — M25519 Pain in unspecified shoulder: Secondary | ICD-10-CM | POA: Diagnosis not present

## 2014-08-28 DIAGNOSIS — M25619 Stiffness of unspecified shoulder, not elsewhere classified: Secondary | ICD-10-CM | POA: Diagnosis not present

## 2014-08-28 DIAGNOSIS — M25419 Effusion, unspecified shoulder: Secondary | ICD-10-CM | POA: Diagnosis not present

## 2014-08-29 NOTE — Telephone Encounter (Signed)
Patient called back yesterday 08-28-14 and was ready to schedule procedure. Agreeable to date of 09-17-14. Advised she will have pre surgery consult with both physicians prior to surgery.

## 2014-08-30 ENCOUNTER — Ambulatory Visit (INDEPENDENT_AMBULATORY_CARE_PROVIDER_SITE_OTHER): Payer: Medicare Other | Admitting: *Deleted

## 2014-08-30 VITALS — BP 120/60 | HR 72 | Resp 16 | Ht 63.25 in | Wt 197.0 lb

## 2014-08-30 DIAGNOSIS — M25419 Effusion, unspecified shoulder: Secondary | ICD-10-CM | POA: Diagnosis not present

## 2014-08-30 DIAGNOSIS — N813 Complete uterovaginal prolapse: Secondary | ICD-10-CM

## 2014-08-30 DIAGNOSIS — M25619 Stiffness of unspecified shoulder, not elsewhere classified: Secondary | ICD-10-CM | POA: Diagnosis not present

## 2014-08-30 DIAGNOSIS — M25519 Pain in unspecified shoulder: Secondary | ICD-10-CM | POA: Diagnosis not present

## 2014-08-30 DIAGNOSIS — R293 Abnormal posture: Secondary | ICD-10-CM | POA: Diagnosis not present

## 2014-08-30 LAB — POCT URINALYSIS DIPSTICK
BILIRUBIN UA: NEGATIVE
GLUCOSE UA: NEGATIVE
Ketones, UA: NEGATIVE
Leukocytes, UA: NEGATIVE
Nitrite, UA: NEGATIVE
Protein, UA: NEGATIVE
RBC UA: NEGATIVE
Urobilinogen, UA: NEGATIVE
pH, UA: 7

## 2014-08-30 NOTE — Progress Notes (Signed)
Patient presents today for UA prior urodynamics. Patient denies any problems or Sx. UA = Clear

## 2014-08-30 NOTE — Telephone Encounter (Signed)
Surgery is scheduled for 09-17-14 at 0730 at [facility]. Patient in office for urodynamic screening. Notified of surgery date and time. Surgical instruction sheet reviewed and printed copy given to patient. Surgery consult appointments scheduled. Patient with multiple questions about percentage of complications after reading ACOG booklets. Advised these questions can be discussed in detail with MD at surgery consult. Offered to postpone surgery until she has answers to the questions, patient declines.  States she has no bladder control and is most anxious to proceed.    Routing to provider for final review. Patient agreeable to disposition. Will close encounter  CC: Dr Sabra Heck, mutual surgery case, surgery consult also scheduled with Dr Sabra Heck.

## 2014-09-02 ENCOUNTER — Telehealth: Payer: Self-pay

## 2014-09-02 DIAGNOSIS — M25619 Stiffness of unspecified shoulder, not elsewhere classified: Secondary | ICD-10-CM | POA: Diagnosis not present

## 2014-09-02 DIAGNOSIS — M25419 Effusion, unspecified shoulder: Secondary | ICD-10-CM | POA: Diagnosis not present

## 2014-09-02 DIAGNOSIS — M25519 Pain in unspecified shoulder: Secondary | ICD-10-CM | POA: Diagnosis not present

## 2014-09-02 DIAGNOSIS — R293 Abnormal posture: Secondary | ICD-10-CM | POA: Diagnosis not present

## 2014-09-02 NOTE — Telephone Encounter (Signed)
Message copied by Robley Fries on Mon Sep 02, 2014 11:01 AM ------      Message from: Whigham, BROOK E      Created: Fri Aug 30, 2014 12:56 PM       OK to proceed with urodynamics. ------

## 2014-09-02 NOTE — Telephone Encounter (Signed)
Patient notified-has appointment 09/04/14.//kn

## 2014-09-02 NOTE — Telephone Encounter (Signed)
Lmtcb//kn 

## 2014-09-04 ENCOUNTER — Ambulatory Visit (INDEPENDENT_AMBULATORY_CARE_PROVIDER_SITE_OTHER): Payer: Medicare Other | Admitting: Obstetrics and Gynecology

## 2014-09-04 VITALS — BP 130/60 | HR 76 | Temp 98.0°F | Ht 63.25 in | Wt 192.0 lb

## 2014-09-04 DIAGNOSIS — M25519 Pain in unspecified shoulder: Secondary | ICD-10-CM | POA: Diagnosis not present

## 2014-09-04 DIAGNOSIS — N813 Complete uterovaginal prolapse: Secondary | ICD-10-CM | POA: Diagnosis not present

## 2014-09-04 DIAGNOSIS — M25419 Effusion, unspecified shoulder: Secondary | ICD-10-CM | POA: Diagnosis not present

## 2014-09-04 DIAGNOSIS — M25619 Stiffness of unspecified shoulder, not elsewhere classified: Secondary | ICD-10-CM | POA: Diagnosis not present

## 2014-09-04 DIAGNOSIS — R293 Abnormal posture: Secondary | ICD-10-CM | POA: Diagnosis not present

## 2014-09-04 NOTE — Progress Notes (Signed)
Multichannel urodynamic testing performed with reduction of prolapse with pessary.   Uroflow -  Voided 126 cc. PVR - not recorded.  Interrupted voiding pattern.   CMG -  Stable CMG.  S1 - 257 cc S2 - 415 cc S3 - 686 cc.  LPP - 64 cm H20.  UPP -  25 cm H20  Pressure flow -  Pdet mas not recorded.   Evidence of genuine stress incontinence at maximum capacity.

## 2014-09-04 NOTE — Patient Instructions (Signed)
You may have a mild bladder and rectal discomfort for a few hours after the test.   You may experience some frequent urination and slight burning the first few times you urinate after the test. Rarely, the urine may be blood tinged. These are both due to catheter placements and resolve quickly.    You should call our office immediately if you have signs of infection, which may include bladder pain, urinary urgency, fever, or burning during urination.    We do encourage you to drink plenty of water after completion of the test today.     [facility] [address] [place], [place] [address] [facility]:  [phone]; Fax:  [phone]

## 2014-09-04 NOTE — Progress Notes (Signed)
[name] [name] is a 73 y.o. female Who presents today for urodynamics testing, ordered by Dr. Quincy Simmonds.   Allergies and medications reviewed.  Denies complaints today. No urinary complaints.   Urine Micro exam: negative for WBC's or RBC's, okay to proceed per Dr. Quincy Simmonds.  Patient reports urinary leakage with full bladder, difficulty making it to the restroom to void without accidents. Cystocele reduced with patient's own pessary that she has brought from home. Placed prior to initiating procedures and removed at time of procedure completed.   Urodynamics testing initiated. Lumax Bladder Catheter #10 [place] and lumax Abdominal Catheter #10 [place].   Post void residual 130 ml.   Urethral catheter placed without issue. Rectal catheter placed without issue.   Urodynamics testing completed.  Please see scanned Patient summary report in Epic. Procedure completed and patient tolerated well without complaints. Patient scheduled for follow up office visit with Dr. Quincy Simmonds to discuss results. Patient agreeable.   Patient given post procedure instructions:  You may have a mild bladder and rectal discomfort for a few hours after the test. You may experience some frequent urination and slight burning the first few times you urinate after the test. Rarely, the urine may be blood tinged. These are both due to catheter placements and resolve quickly. You should call our office immediately if you have signs of infection, which may include bladder pain, urinary urgency, fever, or burning during urination. We do encourage you to drink plenty of water after the test.

## 2014-09-06 DIAGNOSIS — M25411 Effusion, right shoulder: Secondary | ICD-10-CM | POA: Diagnosis not present

## 2014-09-06 DIAGNOSIS — M25611 Stiffness of right shoulder, not elsewhere classified: Secondary | ICD-10-CM | POA: Diagnosis not present

## 2014-09-06 DIAGNOSIS — M25511 Pain in right shoulder: Secondary | ICD-10-CM | POA: Diagnosis not present

## 2014-09-06 DIAGNOSIS — R293 Abnormal posture: Secondary | ICD-10-CM | POA: Diagnosis not present

## 2014-09-09 ENCOUNTER — Encounter: Payer: Self-pay | Admitting: Certified Nurse Midwife

## 2014-09-09 ENCOUNTER — Ambulatory Visit (INDEPENDENT_AMBULATORY_CARE_PROVIDER_SITE_OTHER): Payer: Medicare Other | Admitting: Certified Nurse Midwife

## 2014-09-09 VITALS — BP 112/60 | HR 80 | Temp 99.2°F | Resp 16 | Ht 63.25 in | Wt 190.0 lb

## 2014-09-09 DIAGNOSIS — G894 Chronic pain syndrome: Secondary | ICD-10-CM | POA: Diagnosis not present

## 2014-09-09 DIAGNOSIS — N390 Urinary tract infection, site not specified: Secondary | ICD-10-CM | POA: Diagnosis not present

## 2014-09-09 DIAGNOSIS — R319 Hematuria, unspecified: Secondary | ICD-10-CM | POA: Diagnosis not present

## 2014-09-09 DIAGNOSIS — M961 Postlaminectomy syndrome, not elsewhere classified: Secondary | ICD-10-CM | POA: Diagnosis not present

## 2014-09-09 DIAGNOSIS — Z79891 Long term (current) use of opiate analgesic: Secondary | ICD-10-CM | POA: Diagnosis not present

## 2014-09-09 LAB — POCT URINALYSIS DIPSTICK
Bilirubin, UA: NEGATIVE
Glucose, UA: NEGATIVE
KETONES UA: NEGATIVE
Nitrite, UA: NEGATIVE
Protein, UA: 100
UROBILINOGEN UA: NEGATIVE
pH, UA: 7

## 2014-09-09 MED ORDER — AMPICILLIN 500 MG PO CAPS: 500.0000 mg | ORAL_CAPSULE | Freq: Three times a day (TID) | ORAL | Status: DC

## 2014-09-09 NOTE — Progress Notes (Signed)
73 y.o. married white female [id number] here with complaint of UTI, with onset  on 09/06/14. Patient complaining of urinary frequency/urgency/ and pain with urination.Patient has had UTI previous and aware of the symptoms. Patient denies fever, chills, nausea or back pain.Patient feels like she is emptying bladder without problems. No new personal products. Patient feels maybe related to sexual activity. Last sexual activity 09/05/14. Denies any vaginal symptoms. Patient had urodynamic study here 09/04/14 and hysterectomy/with cystocele repair scheduled per patient 09/17/14. Previous use of pessary and used pessary for testing recently. No other health issues today.  O: Healthy female WDWN Affect: Normal, orientation x 3 Skin : warm and dry CVAT: negative bilateral Abdomen: positive for suprapubic tenderness, abdomen soft  Pelvic exam: External genital area: normal, no lesions Bladder,Urethra tender Urethral meatus: tender, red Vagina: normal vaginal discharge, normal appearance grade 3 cystocele noted Cervix: normal, non tender Uterus:normal,non tender Adnexa: normal non tender, no fullness or masses   A: UTI Grade 3 cystocele with repair scheduled. ? Related to sexual activity/ recent testing? Numerous medication allergies and red dye, patient has taken ampicillin and amoxicillin before without problems  P: Reviewed findings of UTI and need for treatment. Encouraged increase in water with lemon intake and unsweetened cranberry juice.  BX:UXYBFXOVAN 500 mg see order VBT:YOMAY micro, culture Reviewed warning signs and symptoms of UTI and need to advise if occuring. Patient has appointment for pre op in 3 days and can be reevaluated then. Encouraged to limit soda, tea, and coffee RV 2 week if TOC needed for positive culture  RV prn

## 2014-09-09 NOTE — Progress Notes (Signed)
Reviewed personally.  M. Suzanne Latoyna Hird, MD.  

## 2014-09-09 NOTE — Patient Instructions (Signed)

## 2014-09-10 DIAGNOSIS — Z23 Encounter for immunization: Secondary | ICD-10-CM | POA: Diagnosis not present

## 2014-09-10 LAB — URINALYSIS, MICROSCOPIC ONLY
CRYSTALS: NONE SEEN
Casts: NONE SEEN
SQUAMOUS EPITHELIAL / LPF: NONE SEEN

## 2014-09-11 ENCOUNTER — Encounter (HOSPITAL_COMMUNITY): Payer: Self-pay | Admitting: Pharmacist

## 2014-09-11 ENCOUNTER — Other Ambulatory Visit: Payer: Self-pay | Admitting: Obstetrics and Gynecology

## 2014-09-11 NOTE — Patient Instructions (Addendum)
   Your procedure is scheduled on:  Tuesday, Oct 13  Enter through the [employer] [facility] at: 6 AM Pick up the phone at the desk and dial [phone] and inform us of your arrival.  Please call this number if you have any problems the morning of surgery: [phone]  Remember: Do not eat or drink after midnight: Monday Take these medicines the morning of surgery with a SIP OF WATER: carvediol, lasix, gabapentin, levothyroxine, methadone, claritin, valium if needed  Do not wear jewelry, make-up, or FINGER nail polish No metal in your hair or on your body. Do not wear lotions, powders, perfumes.  You may wear deodorant.  Do not bring valuables to the hospital. Contacts, dentures or bridgework may not be worn into surgery.  Leave suitcase in the car. After Surgery it may be brought to your room. For patients being admitted to the hospital, checkout time is 11:00am the day of discharge.  Home with husband Clair Gulling cell [phone]

## 2014-09-12 ENCOUNTER — Encounter (HOSPITAL_COMMUNITY)
Admission: RE | Admit: 2014-09-12 | Discharge: 2014-09-12 | Disposition: A | Discharge status: Home / Self Care | Payer: Medicare Other | Source: Ambulatory Visit | Attending: Obstetrics and Gynecology | Admitting: Obstetrics and Gynecology

## 2014-09-12 ENCOUNTER — Encounter (HOSPITAL_COMMUNITY): Payer: Self-pay

## 2014-09-12 ENCOUNTER — Telehealth: Payer: Self-pay

## 2014-09-12 ENCOUNTER — Ambulatory Visit (INDEPENDENT_AMBULATORY_CARE_PROVIDER_SITE_OTHER): Payer: Medicare Other | Admitting: Obstetrics and Gynecology

## 2014-09-12 ENCOUNTER — Encounter: Payer: Self-pay | Admitting: Obstetrics and Gynecology

## 2014-09-12 VITALS — BP 128/60 | HR 70 | Ht 63.25 in | Wt 194.0 lb

## 2014-09-12 DIAGNOSIS — N8110 Cystocele, unspecified: Secondary | ICD-10-CM | POA: Insufficient documentation

## 2014-09-12 DIAGNOSIS — M1711 Unilateral primary osteoarthritis, right knee: Secondary | ICD-10-CM | POA: Insufficient documentation

## 2014-09-12 DIAGNOSIS — M13819 Other specified arthritis, unspecified shoulder: Secondary | ICD-10-CM | POA: Insufficient documentation

## 2014-09-12 DIAGNOSIS — Z0000 Encounter for general adult medical examination without abnormal findings: Secondary | ICD-10-CM | POA: Diagnosis not present

## 2014-09-12 DIAGNOSIS — G8929 Other chronic pain: Secondary | ICD-10-CM | POA: Insufficient documentation

## 2014-09-12 DIAGNOSIS — I10 Essential (primary) hypertension: Secondary | ICD-10-CM | POA: Diagnosis not present

## 2014-09-12 DIAGNOSIS — E039 Hypothyroidism, unspecified: Secondary | ICD-10-CM | POA: Diagnosis not present

## 2014-09-12 DIAGNOSIS — N390 Urinary tract infection, site not specified: Secondary | ICD-10-CM

## 2014-09-12 DIAGNOSIS — K219 Gastro-esophageal reflux disease without esophagitis: Secondary | ICD-10-CM | POA: Diagnosis not present

## 2014-09-12 DIAGNOSIS — M545 Low back pain: Secondary | ICD-10-CM | POA: Insufficient documentation

## 2014-09-12 DIAGNOSIS — B964 Proteus (mirabilis) (morganii) as the cause of diseases classified elsewhere: Secondary | ICD-10-CM | POA: Diagnosis not present

## 2014-09-12 DIAGNOSIS — N813 Complete uterovaginal prolapse: Secondary | ICD-10-CM | POA: Diagnosis not present

## 2014-09-12 HISTORY — DX: Other seasonal allergic rhinitis: J30.2

## 2014-09-12 HISTORY — DX: Missed abortion: O02.1

## 2014-09-12 LAB — URINE CULTURE: Colony Count: >=100000

## 2014-09-12 LAB — CBC
HCT: 35.3 % — ABNORMAL LOW (ref 36.0–46.0)
Hemoglobin: 12 g/dL (ref 12.0–15.0)
MCH: 30.4 pg (ref 26.0–34.0)
MCHC: 34 g/dL (ref 30.0–36.0)
MCV: 89.4 fL (ref 78.0–100.0)
PLATELETS: 201 10*3/uL (ref 150–400)
RBC: 3.95 MIL/uL (ref 3.87–5.11)
RDW: 14.7 % (ref 11.5–15.5)
WBC: 5.2 10*3/uL (ref 4.0–10.5)

## 2014-09-12 LAB — COMPREHENSIVE METABOLIC PANEL
ALK PHOS: 136 U/L — AB (ref 39–117)
ALT: 39 U/L — ABNORMAL HIGH (ref 0–35)
AST: 39 U/L — AB (ref 0–37)
Albumin: 3.6 g/dL (ref 3.5–5.2)
Anion gap: 11 (ref 5–15)
BUN: 10 mg/dL (ref 6–23)
CALCIUM: 8.9 mg/dL (ref 8.4–10.5)
CO2: 27 meq/L (ref 19–32)
Chloride: 99 mEq/L (ref 96–112)
Creatinine, Ser: 0.77 mg/dL (ref 0.50–1.10)
GFR calc Af Amer: >90 mL/min (ref >90)
GFR calc non Af Amer: 82 mL/min — ABNORMAL LOW (ref >90)
Glucose, Bld: 97 mg/dL (ref 70–99)
Potassium: 4 mEq/L (ref 3.7–5.3)
Sodium: 137 mEq/L (ref 137–147)
TOTAL PROTEIN: 7.6 g/dL (ref 6.0–8.3)
Total Bilirubin: 0.3 mg/dL (ref 0.3–1.2)

## 2014-09-12 NOTE — Patient Instructions (Signed)
Please return on Monday for a urinalysis.

## 2014-09-12 NOTE — Telephone Encounter (Signed)
Patient notified of results. See lab 

## 2014-09-12 NOTE — Telephone Encounter (Signed)
[email or web address]

## 2014-09-12 NOTE — Telephone Encounter (Signed)
Message copied by [name] [name] on Thu Sep 12, 2014  2:11 PM ------      Message from: Regina Eck      Created: Thu Sep 12, 2014 12:58 PM       Notify patient that her medication she is own is sensitive to the Ampicillin and continue follow up as planned for TOC ------

## 2014-09-12 NOTE — Progress Notes (Signed)
Patient ID: [name] [name], female   DOB: 06-Apr-1941, 73 y.o.   MRN: [id number] GYNECOLOGY  VISIT   HPI: 73 y.o.   Married  Caucasian  female   G59P2 with Patient's last menstrual period was 12/06/2000.   here for consult regarding urodynamics results and surgical consultation.  Wishes to proceed with surgery.   Urodynamics on 09/04/14 showed genuine stress incontinence.   Patient was seen 09/09/14 for UTI and is taking amoxicillin.  Urine culture showed Proteus.  Feels a lot better and urine is now clear. Having temp low grade.   Complete uterovaginal prolapse. Failed pessary use.  Chronic back pain. On Methodone. Status post multiple back surgeries. Posterior approach for a spinal fusion.  Multiple allergies.   Can take Dilaudid without problem. Is also able to take ibuprofen.   GYNECOLOGIC HISTORY: Patient's last menstrual period was 12/06/2000. Contraception:  Postmenopausal.  Menopausal hormone therapy: none        OB History   Grav Para Term Preterm Abortions TAB SAB Ect Mult Living   9 2        2          Patient Active Problem List   Diagnosis Date Noted  . Cystocele 08/16/2014  . Glenohumeral arthritis 05/09/2014  . 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 Hypertension   . Hypothyroidism   . Arthritis   . GERD (gastroesophageal reflux disease)   . Blood transfusion   . Chronic pain   . Bronchitis     finishing rx  now  . Ulcer     hx  . H/O hiatal hernia   . Virus not detected 2010    postop, caught virus in hosp., respiratory symptoms, upon d/c to home, had allergic reaction /w plate size hives - not sure of the cause    . PONV (postoperative nausea and vomiting)     states she had scop patch in past,states she sets off alarms when she is waking up, she "forgets to breathe"   . Sinus infection     recent sinus infection, finished Zpak- 08/16/2013  . Neuromuscular disorder     DDD, radiculopathy- lumbar  .  Female bladder prolapse   . Anemia     took iron for a while    Past Surgical History  Procedure Laterality Date  . Hammer toe surgery  4/12    lt foot-2-3  . Appendectomy    . Metatarsal osteotomy  3/12    lt  . Thumb fusion  2008    rt  . Carpal tunnel release      both  . Knee arthroscopy  2007    both  . Back surgery  09,00,10,99, 6/14    Spinal Fusin L4-L5, Lumbar Fusion L1-S1  . Orif toe fracture  11/04/2011    Procedure: OPEN REDUCTION INTERNAL FIXATION (ORIF) METATARSAL (TOE) FRACTURE;  Surgeon: Wylene Simmer, MD;  Location: [facility];  Service: Orthopedics;  Laterality: Left;  left revision orif 1st metatarsal with autograft from left calcaneous and left 2nd-3rd metatarsal weil osteotomy  . 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 Dilation and curettage of uterus      x2  . Total knee arthroplasty  08/29/2012    Procedure: TOTAL KNEE ARTHROPLASTY;  Surgeon: Hessie Dibble, MD;  Location: [facility];  Service: Orthopedics;  Laterality: Right;  . Other surgical history  05/08/13  hardware removed from left foot  . Bunionectomy  03/02/11 and 11/04/11    left foot x2  . Breast surgery      cyst- benign  . Joint replacement      R- TKA- 2013  . Tubal ligation    . Total shoulder arthroplasty Right 05/09/2014    Procedure: TOTAL SHOULDER ARTHROPLASTY;  Surgeon: Nita Sells, MD;  Location: [place];  Service: Orthopedics;  Laterality: Right;  Right shoulder arthroplasty  . Lumbar laminectomy  05/1999    L4-L5    Current Outpatient Prescriptions  Medication Sig Dispense Refill  . ampicillin (PRINCIPEN) 500 MG capsule Take 500 mg by mouth 3 (three) times daily. For seven days      . Ascorbic Acid (VITAMIN C PO) Take 1,000 mg by mouth daily.       Marland Kitchen aspirin EC 81 MG tablet Take 81 mg by mouth daily.      . Biotin 5000 MCG CAPS Take 1 capsule by mouth daily.      . Calcium Carbonate-Vit D-Min (CALCIUM 1200 PO) Take 1,200 mg by mouth daily.       . carvedilol  (COREG) 6.25 MG tablet Take 6.25 mg by mouth 2 (two) times daily with a meal.      . diazepam (VALIUM) 5 MG tablet Take 5 mg by mouth every 8 (eight) hours as needed for muscle spasms (for back spasms after surgery).      . Flaxseed, Linseed, (FLAX SEED OIL PO) Take 2,000 mg by mouth daily.       . furosemide (LASIX) 40 MG tablet Take 40 mg by mouth daily.       Marland Kitchen gabapentin (NEURONTIN) 600 MG tablet Take 600 mg by mouth 3 (three) times daily.       Marland Kitchen levothyroxine (SYNTHROID, LEVOTHROID) 75 MCG tablet Take 75 mcg by mouth daily.       . methadone (DOLOPHINE) 5 MG tablet Take 5 mg by mouth 4 (four) times daily.      . Multiple Vitamin (MULTIVITAMIN WITH MINERALS) TABS Take 1 tablet by mouth daily.      . Multiple Vitamins-Minerals (PRESERVISION/LUTEIN PO) Take 1 tablet by mouth 2 (two) times daily.      . pravastatin (PRAVACHOL) 20 MG tablet Take 20 mg by mouth at bedtime.       . senna (SENOKOT) 8.6 MG tablet Take 2-3 tablets by mouth 2 (two) times daily. 2 tablets in the morning, 3 tablets in the evening      . EPINEPHrine (EPI-PEN) 0.3 mg/0.3 mL DEVI Inject 0.3 mg into the muscle once. For various allergic reactions.       No current facility-administered medications for this visit.     ALLERGIES: Flexeril; Red dye; Ciprofloxacin; Other; Tylenol; Eggs or egg-derived products; Latex; Lisinopril; Methocarbamol; Morphine and related; and Sulfa antibiotics  Family History  Problem Relation Age of Onset  . Hyperlipidemia Father   . Hypertension Father   . Heart attack Father 82    deceased    History   Social History  . Marital Status: Married    Spouse Name: N/A    Number of Children: N/A  . Years of Education: N/A   Occupational History  . Not on file.   Social History Main Topics  . Smoking status: Never Smoker   . Smokeless tobacco: Never Used     Comment: occ wine  . Alcohol Use: 0.5 oz/week    1 drink(s) per week  Comment: occas. glass of wine   . Drug Use: No  .  Sexual Activity: Yes    Partners: Male    Birth Control/ Protection: Post-menopausal   Other Topics Concern  . Not on file   Social History Narrative  . No narrative on file    ROS:  Pertinent items are noted in HPI.  PHYSICAL EXAMINATION:    BP 128/60  Pulse 70  Ht 5' 3.25" (1.607 m)  Wt 194 lb (87.998 kg)  BMI 34.08 kg/m2  LMP 12/06/2000    Temp 98.6  General appearance: alert, cooperative and appears stated age See previous entire physical exam.  ASSESSMENT  Complete uterovaginal prolapse. Failed pessary use.  Chronic back pain. On Methodone. Status post multiple back surgeries.  Multiple allergies.  Proteus UTI post urodynamics.   PLAN   I discussed midurethral sling at the time of the patient's abdominal hysterectomy with bilateral salpingo-oophorectomy, sacrocolpopexy with permanent graft, anterior and posterior colporrhaphy, cystoscopy. We discussed benefits and risks of surgery which include but are not limited to bleeding, infection, damage to surrounding organs, ureteral damage, vaginal pain with intercourse, mesh erosion and exposure, dyspareunia, urinary retention and need for prolonged catheterization and/or self catheterization, reoperation, recurrence of prolapse and incontinence, DVT, PE, death, and reaction to anesthesia.  I have discussed surgical expectations regarding the procedures and success rates, outcomes, and recovery.   PLAN  Will check sensitivities to the UTI.  Should be ready this afternoon.  Patient will return on 10/12 for a UA, potential cath UA to make final determination if can proceed with surgery on 10/13. Has preop tomorrow with Dr. Sabra Heck who will be performing the TAH/BSO. Dilaudid for post op pain.   An After Visit Summary was printed and given to the patient.  _25_____ minutes face to face time of which over 50% was spent in counseling.

## 2014-09-13 ENCOUNTER — Ambulatory Visit (INDEPENDENT_AMBULATORY_CARE_PROVIDER_SITE_OTHER): Payer: Medicare Other | Admitting: Obstetrics & Gynecology

## 2014-09-13 VITALS — BP 118/58 | HR 68 | Resp 16 | Ht 63.25 in | Wt 188.6 lb

## 2014-09-13 DIAGNOSIS — N814 Uterovaginal prolapse, unspecified: Secondary | ICD-10-CM

## 2014-09-13 DIAGNOSIS — N8189 Other female genital prolapse: Secondary | ICD-10-CM

## 2014-09-13 MED ORDER — HYDROMORPHONE HCL 2 MG PO TABS: 2.0000 mg | ORAL_TABLET | ORAL | Status: DC | PRN

## 2014-09-13 NOTE — Progress Notes (Signed)
73 y.o. G70P2 MarriedCaucasian female here for discussion of upcoming procedure.  TAH/cystocele/rectocele planned due to significant pelvic relaxation and failed pessary use.  Pt currently being treated for UTI.  Has repeat u/a schedule for Monday.  Pre-op evaluation thus far has included urodynamics testing with Dr. Quincy Simmonds.     Procedure discussed with patient and spouse.  Hospital stay, recovery and pain management all discussed.  Risks discussed including but not limited to bleeding, 1% risk of receiving a  transfusion, infection, 3-4% risk of bowel/bladder/ureteral/vascular injury discussed as well as possible need for additional surgery if injury does occur discussed.  DVT/PE and rare risk of death discussed.  My actual complications with prior surgeries discussed.  Vaginal cuff dehiscence discussed.  Hernia formation discussed.  Positioning and incision locations discussed.  Patient aware if pathology abnormal she may need additional treatment.  All questions answered.    Ob Hx:   Patient's last menstrual period was 12/06/2000.          Last pap: 06/07/13 WNL Last MMG: 08/26/14-normal Tobacco: no  Past Surgical History  Procedure Laterality Date  . Hammer toe surgery  4/12    lt foot-2-3  . Appendectomy    . Metatarsal osteotomy  3/12    lt  . Thumb fusion  2008    rt  . Carpal tunnel release      both  . Knee arthroscopy  2007    both  . Orif toe fracture  11/04/2011    Procedure: OPEN REDUCTION INTERNAL FIXATION (ORIF) METATARSAL (TOE) FRACTURE;  Surgeon: Wylene Simmer, MD;  Location: [facility];  Service: Orthopedics;  Laterality: Left;  left revision orif 1st metatarsal with autograft from left calcaneous and left 2nd-3rd metatarsal weil osteotomy  . 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 Dilation and curettage of uterus      x2  . Total knee arthroplasty  08/29/2012    Procedure: TOTAL KNEE ARTHROPLASTY;  Surgeon: Hessie Dibble, MD;  Location: [facility];  Service: Orthopedics;   Laterality: Right;  . Other surgical history  05/08/13    hardware removed from left foot  . Bunionectomy  03/02/11 and 11/04/11    left foot x2  . Joint replacement      R- TKA- 2013  . Tubal ligation    . Total shoulder arthroplasty Right 05/09/2014    Procedure: TOTAL SHOULDER ARTHROPLASTY;  Surgeon: Nita Sells, MD;  Location: [facility];  Service: Orthopedics;  Laterality: Right;  Right shoulder arthroplasty  . Lumbar laminectomy  05/1999    L4-L5  . Back surgery  09,00,10,99, 6/14,9/14    x6 surgeries on back -Spinal Fusin L4-L5, Lumbar Fusion L1-S1  . Breast surgery      left cyst- benign    Past Medical History  Diagnosis Date  . Hypertension   . Hypothyroidism   . GERD (gastroesophageal reflux disease)   . Chronic pain     lower back  . Bronchitis     history only  . Ulcer     hx  . H/O hiatal hernia   . Virus not detected 2010    postop, caught virus in hosp., respiratory symptoms, upon d/c to home, had allergic reaction /w plate size hives - not sure of the cause    . PONV (postoperative nausea and vomiting)     states she had scop patch in past,states she sets off alarms when she is waking up, she "forgets to breathe"   .  Sinus infection     recent sinus infection, finished Zpak- 08/16/2013  . Female bladder prolapse   . Anemia     took iron for a while  . SVD (spontaneous vaginal delivery)     x 2  . Missed ab     x 6 - 2 surgery, 4 resolved on it's own  . Seasonal allergies   . Arthritis     lower back  . Neuromuscular disorder     , left foot has some numbeness- uses cane    Allergies: Flexeril; Red dye; Ciprofloxacin; Other; Tylenol; Eggs or egg-derived products; Latex; Lisinopril; Methocarbamol; Morphine and related; and Sulfa antibiotics  Current Outpatient Prescriptions  Medication Sig Dispense Refill  . ampicillin (PRINCIPEN) 500 MG capsule Take 500 mg by mouth 3 (three) times daily. For seven days      . Ascorbic Acid (VITAMIN C PO) Take  1,000 mg by mouth daily.       . Biotin 5000 MCG CAPS Take 1 capsule by mouth daily.      . carvedilol (COREG) 6.25 MG tablet Take 6.25 mg by mouth 2 (two) times daily with a meal.      . diazepam (VALIUM) 5 MG tablet Take 5 mg by mouth every 8 (eight) hours as needed for muscle spasms (for back spasms after surgery).      Marland Kitchen EPINEPHrine (EPI-PEN) 0.3 mg/0.3 mL DEVI Inject 0.3 mg into the muscle once. For various allergic reactions.      . furosemide (LASIX) 40 MG tablet Take 40 mg by mouth daily.       Marland Kitchen gabapentin (NEURONTIN) 600 MG tablet Take 600 mg by mouth 3 (three) times daily.       Marland Kitchen levothyroxine (SYNTHROID, LEVOTHROID) 75 MCG tablet Take 75 mcg by mouth daily.       . methadone (DOLOPHINE) 5 MG tablet Take 5 mg by mouth 4 (four) times daily.      . Multiple Vitamin (MULTIVITAMIN WITH MINERALS) TABS Take 1 tablet by mouth daily.      . Multiple Vitamins-Minerals (PRESERVISION/LUTEIN PO) Take 1 tablet by mouth 2 (two) times daily.      . pravastatin (PRAVACHOL) 20 MG tablet Take 20 mg by mouth at bedtime.       . senna (SENOKOT) 8.6 MG tablet Take 2-3 tablets by mouth 2 (two) times daily. 2 tablets in the morning, 3 tablets in the evening      . aspirin EC 81 MG tablet Take 81 mg by mouth daily.      . Calcium Carbonate-Vit D-Min (CALCIUM 1200 PO) Take 1,200 mg by mouth daily.       . Flaxseed, Linseed, (FLAX SEED OIL PO) Take 2,000 mg by mouth daily.        No current facility-administered medications for this visit.    ROS: A comprehensive review of systems was negative.  Exam:    BP 118/58  Pulse 68  Resp 16  Ht 5' 3.25" (1.607 m)  Wt 188 lb 9.6 oz (85.548 kg)  BMI 33.13 kg/m2  LMP 12/06/2000  General appearance: alert and cooperative Head: Normocephalic, without obvious abnormality, atraumatic Neck: no adenopathy, supple, symmetrical, trachea midline and thyroid not enlarged, symmetric, no tenderness/mass/nodules Lungs: clear to auscultation bilaterally Heart: regular  rate and rhythm, S1, S2 normal, no murmur, click, rub or gallop Abdomen: soft, non-tender; bowel sounds normal; no masses,  no organomegaly Extremities: extremities normal, atraumatic, no cyanosis or edema Skin: Skin color, texture, turgor normal.  No rashes or lesions Lymph nodes: Cervical, supraclavicular, and axillary nodes normal. no inguinal nodes palpated Neurologic: Grossly normal  Pelvic: External genitalia:  no lesions              Urethra: normal appearing urethra with no masses, tenderness or lesions              Bartholins and Skenes: normal                 Vagina: normal appearing vagina with normal color and discharge, no lesions, large cystocele and uterine prolapse              Cervix: normal appearance              Pap taken: No.        Bimanual Exam:  Uterus:  uterus is normal size, shape, consistency and nontender                                      Adnexa:    normal adnexa in size, nontender and no masses                                      Rectovaginal: Deferred                                      Anus:  normal sphincter tone, no lesions  A: Pelvic relaxation with large cystocele Multiple drug allergies Currently being treated for UTI     P:  TAH/BSO with abdominosacrocolpopexy, ant and post repair, TVT exact with cysto planned Rx for Motrin and Percocet given. Medications/Vitamins reviewed.  Pt knows to not take any ASA products  ~25 minutes spent with patient >50% of time was in face to face discussion of above.

## 2014-09-16 ENCOUNTER — Telehealth: Payer: Self-pay | Admitting: *Deleted

## 2014-09-16 ENCOUNTER — Ambulatory Visit (INDEPENDENT_AMBULATORY_CARE_PROVIDER_SITE_OTHER): Payer: Medicare Other | Admitting: Obstetrics and Gynecology

## 2014-09-16 ENCOUNTER — Other Ambulatory Visit: Payer: Self-pay | Admitting: Obstetrics and Gynecology

## 2014-09-16 ENCOUNTER — Other Ambulatory Visit: Payer: Self-pay

## 2014-09-16 VITALS — BP 132/80 | HR 68 | Ht 63.25 in | Wt 193.0 lb

## 2014-09-16 DIAGNOSIS — N390 Urinary tract infection, site not specified: Secondary | ICD-10-CM

## 2014-09-16 DIAGNOSIS — B964 Proteus (mirabilis) (morganii) as the cause of diseases classified elsewhere: Secondary | ICD-10-CM

## 2014-09-16 LAB — URINALYSIS, MICROSCOPIC ONLY
Bacteria, UA: NONE SEEN
CASTS: NONE SEEN
Crystals: NONE SEEN
SQUAMOUS EPITHELIAL / LPF: NONE SEEN

## 2014-09-16 LAB — POCT URINALYSIS DIPSTICK
Bilirubin, UA: NEGATIVE
Blood, UA: NEGATIVE
Glucose, UA: NEGATIVE
KETONES UA: NEGATIVE
Nitrite, UA: NEGATIVE
Protein, UA: NEGATIVE
Urobilinogen, UA: NEGATIVE
pH, UA: 6

## 2014-09-16 MED ORDER — AMOXICILLIN-POT CLAVULANATE 875-125 MG PO TABS: 1.0000 | ORAL_TABLET | Freq: Two times a day (BID) | ORAL | Status: DC

## 2014-09-16 NOTE — Telephone Encounter (Signed)
Please contact patient by phone to reschedule surgery.  I placed her on Augmentin until her culture returns. I reviewed signs of pyelonephritis.

## 2014-09-16 NOTE — Telephone Encounter (Signed)
Last AEX: 08/16/14 Last refill:03/27/14 #30g X 0 Current AEX:09/12/15  Please advise

## 2014-09-16 NOTE — Telephone Encounter (Addendum)
Phone call to patient on cell phone. Patient upset because she states she has rearranged her life due to surgery.  She feels like we should have had more done prior to now. Had two UTIs in her life, each time following catheterization - one at the time of a surgery and one following the urodynamic testing. States her family member had abx prophylaxis for urodynamics.  Patient and I reviewed urodynamics and evidence based medicine which supports not doing prophylactic antibiotics.  We discussed the art of medicine and how antibiotics are sometimes prescribed but that a bladder infection due to catheterization at the time of a surgery is not usually a risk factor that would prompt this.   We reviewed the importance of getting a sterile catheter specimen to make a clear decision about whether or not to do surgery.   Risk of urosepsis and implications of this discussed if we proceed in the face of known untreated infection.   We reviewed her multiple allergies and how all other antibiotics other than Augmentin she is allergic to or the Proteus organism was resistant to.   We will look at the final culture result from today and then make a decision about prophylaxis from now until surgery.   She wishes to proceed forward with surgery as soon as possible.

## 2014-09-16 NOTE — Progress Notes (Signed)
Pt is here for a UA check. UA dipstick showed small WBC. Per Gay Filler, pt will be getting a cath.  Pt denied any symptoms.

## 2014-09-16 NOTE — Progress Notes (Signed)
In and out and cath with 8 french cath kit using sterile technique. Patient tolerated very well. Cath specimen continues to show small WBC. Sent to lab for stat micro. Patient aware and agreeable. Requests update later today on cell number [phone].

## 2014-09-16 NOTE — Telephone Encounter (Signed)
Call to patient. Updated that rescheduling of case for 10-07-14 is in process. Will call her back tomorrow with final details. Patient agreeable.

## 2014-09-16 NOTE — Telephone Encounter (Signed)
Patient returned call. Advised that Dr Quincy Simmonds and Dr Sabra Heck reviewed UA and recommend cancel surgical procedure scheduled for tomorrow.  Offered rescheduled date 10-07-14.Patient reluctantly states she can work that out.  We will send for culture to see if what/if any new antibiotics are needed. patient states she just finished   Patient states this infection is a result of the cath procedure we performed and now I cathed her again today. Patient states she arranged her life around this surgery. She is sure this infection is a result of our procedure. She has only ever had 2 UTI's and both occurred after catheterizations.  Patient is very upset and expects to receive call back today with update about antibiotics.  Hospital has been notifed that case is canceled

## 2014-09-16 NOTE — Telephone Encounter (Signed)
Per review with Dr Quincy Simmonds and Dr Sabra Heck need to cancel surgery for tomorrow due to positive WBC on Cath UA. Call to patient, [facility]

## 2014-09-17 ENCOUNTER — Encounter: Payer: Self-pay | Admitting: Obstetrics and Gynecology

## 2014-09-17 ENCOUNTER — Inpatient Hospital Stay (HOSPITAL_COMMUNITY)
Admission: RE | Admit: 2014-09-17 | Payer: Medicare Other | Source: Ambulatory Visit | Admitting: Obstetrics and Gynecology

## 2014-09-17 ENCOUNTER — Encounter (HOSPITAL_COMMUNITY): Admission: RE | Payer: Self-pay | Source: Ambulatory Visit

## 2014-09-17 LAB — URINE CULTURE
Colony Count: NO GROWTH
Organism ID, Bacteria: NO GROWTH

## 2014-09-17 SURGERY — HYSTERECTOMY, ABDOMINAL
Anesthesia: General

## 2014-09-17 NOTE — Telephone Encounter (Signed)
If Dr. Quincy Simmonds is the admitting physician, does not need another appt with me. Thanks.

## 2014-09-17 NOTE — Telephone Encounter (Signed)
Patient will need a preop exam by me.   I would suggest 10/28 at 3:00 pm.  (Otherwise, I will not have examined her within the required 30 days.)  Thanks.

## 2014-09-17 NOTE — Progress Notes (Signed)
Encounter reviewed by Dr. Thor Nannini Silva.  

## 2014-09-17 NOTE — Telephone Encounter (Signed)
Call to central scheduling, case confirmed for October 07, 2014. Patient had surgery consult on 09-12-14. Urine culture pending. Will she need to be seen again prior to November 2 surgery and if so, when?

## 2014-09-17 NOTE — Telephone Encounter (Signed)
Call to patient. Advised surgery is rescheduled to 10-07-14 at 900 at [facility]. Advised will need to see Dr Quincy Simmonds on 10-02-14 at 3pm.  I am not sure if hospital will require updated pre-op appointment. OV and exam with Dr Sabra Heck on 09-13-14 so will not need another appt with Dr Sabra Heck. (Surgery 10-07-14, within 30 days of exam) patient expresses great deal of frustration and disappointment regarding entire surgical procedure.  Canceled her vacation for this, now trying to reschedule and never expected to need to be seen again before the surgery. Planned to go to [facility] 09-30-14 thru 10-05-14. I offered 09-30-14 at 1pm. States she guesses best thing to do is "screw it" and come on 10-02-14 as I instructed. Worried that if she has OV on 10-02-14 and we do "sterile specimen" that she will get another infection and surgery will get canceled again. Patient repetitively asking for [place] to be sure this does not happen again. Advised that we can only be "so sure" and cant make any promises. She states she is "horribly concerned about getting this done this year.' If we have to cancel again, she will have to wait till next September due to husbands work schedule. Patient apologized for being frustrated. Support given. Advised will check on culture tomorrow, for prelim report around midday, and advise Dr Quincy Simmonds of concerns and see what/ if anything else to be done. Discussed that pending culture will give [place] additional information. Will see if surgery consult can be 09-27-14 and will check with PAT at [facility] to see if needs to be seen again before Nov 2. (LM on VM at [facility] PAT dept).

## 2014-09-17 NOTE — Telephone Encounter (Signed)
I need to see and examine the patient between now and 10/06/14 due to [facility] requirements.  Her final urine culture was negative for infection.  Her sterile urine catheter specimen on 10/12 indicated white blood cells, which mandated doing the urine culture and halting plans for her surgery.  I am not planning on doing another urine specimen prior to surgery unless the patient develops urinary symptoms of infection, which would require doing a recheck of this.  She will be treated with IV antibiotics on the day of her surgery prior to her surgery beginning.  These antibiotics will be given as standard surgical prevention of infection.  She may reduce her current oral Augmentin to once daily as prevention of bladder infection between now and surgery. If she has further questions, I will be happy to call her.   Cc- Dr. Sabra Heck

## 2014-09-18 MED ORDER — AMOXICILLIN-POT CLAVULANATE 875-125 MG PO TABS: 1.0000 | ORAL_TABLET | Freq: Every day | ORAL | Status: DC

## 2014-09-18 NOTE — Telephone Encounter (Signed)
Pt is returning call to sally

## 2014-09-18 NOTE — Telephone Encounter (Signed)
Call to hosp PAT dept, Marcie Bal, patient will need pre-op appt, scheduled for 09-27-14 at 11:15 am and Dr Quincy Simmonds at 1pm.  Call to patient to discuss. LMTCB.

## 2014-09-18 NOTE — Telephone Encounter (Signed)
Return call to patient. She immediately begins by asking if can have preop on Monday 10-26 am and lab work at hospital at [facility]. Advised appointments can be arranged here for 09-27-14 at 62 am  for hosp and 1pm for Dr Quincy Simmonds. Patient asking about urine culture. Advised culture negative, despite positive WBC. She can decrease antibiotic to daily. Will not plan to repeat culture unless develops symptoms. Patient states she will need refill of Augmentin to [facility]. Has taken five pills, wants to be sure she has enough till surgery on 10-07-14. Will send refill of 10 tabs. She is pleased with current plan.  Routing to provider for final review. Patient agreeable to disposition. Will close encounter

## 2014-09-18 NOTE — Telephone Encounter (Signed)
Return call to Sally. °

## 2014-09-23 ENCOUNTER — Ambulatory Visit: Payer: Medicare Other | Admitting: Obstetrics and Gynecology

## 2014-09-25 MED ORDER — NYSTATIN-TRIAMCINOLONE 100000-0.1 UNIT/GM-% EX OINT: 1.0000 "application " | TOPICAL_OINTMENT | Freq: Two times a day (BID) | CUTANEOUS | Status: DC

## 2014-09-27 ENCOUNTER — Encounter: Payer: Self-pay | Admitting: Obstetrics and Gynecology

## 2014-09-27 ENCOUNTER — Encounter (HOSPITAL_COMMUNITY): Payer: Self-pay

## 2014-09-27 ENCOUNTER — Ambulatory Visit (INDEPENDENT_AMBULATORY_CARE_PROVIDER_SITE_OTHER): Payer: Medicare Other | Admitting: Obstetrics and Gynecology

## 2014-09-27 ENCOUNTER — Encounter (HOSPITAL_COMMUNITY)
Admission: RE | Admit: 2014-09-27 | Discharge: 2014-09-27 | Disposition: A | Discharge status: Home / Self Care | Payer: Medicare Other | Source: Ambulatory Visit | Attending: Obstetrics & Gynecology | Admitting: Obstetrics & Gynecology

## 2014-09-27 ENCOUNTER — Other Ambulatory Visit (HOSPITAL_COMMUNITY): Payer: Medicare Other

## 2014-09-27 VITALS — BP 140/70 | HR 70 | Ht 63.25 in | Wt 191.4 lb

## 2014-09-27 DIAGNOSIS — N813 Complete uterovaginal prolapse: Secondary | ICD-10-CM | POA: Diagnosis not present

## 2014-09-27 DIAGNOSIS — Z01818 Encounter for other preprocedural examination: Secondary | ICD-10-CM | POA: Insufficient documentation

## 2014-09-27 NOTE — Pre-Procedure Instructions (Signed)
Dr Jillyn Hidden ok with EKG and labs from 09/12/14 for upcoming surgery on 10/07/14

## 2014-09-27 NOTE — Patient Instructions (Signed)
We will see you the morning of surgery! Have a [place] time at the beach.

## 2014-09-27 NOTE — Progress Notes (Signed)
Patient ID: [name] [name], female   DOB: [birth year], 73 y.o.   MRN: [id number] GYNECOLOGY  VISIT   HPI: 73 y.o.   Married  Caucasian  female   [id number] with Patient's last menstrual period was 12/06/2000.   here to be evaluated for upcoming surgery for complete uterovaginal prolapse and genuine stress incontinence documented with urodynamic testing.  Desires surgical care.  Planning hysterectomy with Dr. Sabra Heck, her primary gynecologist and prolapse and incontinence surgery with me concurrently.   Taking Augmentin for UTI prophylaxis.  Had a Proteus UTI following urodynamics and was treated with Amoxicillin.  On post abx urine test of cure, patient had WBCs noted on both clean catch and then catheter specimen, and surgery was cancelled for 09/17/14. Ultimately, the urine culture was negative.  Today declines dysuria.  Some vulvar irritation.  Has Mycolog II at home.  Has used a walker in the past.  States that she lists to the left.   No changes in health history since prior preop visit.   Left foot numbness due to spinal problems.  Heel and outer edges.  Big toe sensation intact per patient. History of multiple back surgeries and chronic pain managed with Methadone.   GYNECOLOGIC HISTORY: Patient's last menstrual period was 12/06/2000. Contraception: postmenopausal   Menopausal hormone therapy: none        OB History   Grav Para Term Preterm Abortions TAB SAB Ect Mult Living   9 2        2          Patient Active Problem List   Diagnosis Date Noted  . Cystocele 08/16/2014  . Glenohumeral arthritis 05/09/2014  . 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 Hypertension   . Hypothyroidism   . GERD (gastroesophageal reflux disease)   . Chronic pain     lower back  . Bronchitis     history only  . Ulcer     hx  . Virus not detected 2010    postop, caught virus in hosp., respiratory symptoms, upon d/c to home, had allergic reaction  /w plate size hives - not sure of the cause    . Sinus infection     recent sinus infection, finished Zpak- 08/16/2013  . Female bladder prolapse   . Anemia     took iron for a while  . SVD (spontaneous vaginal delivery)     x 2  . Missed ab     x 6 - 2 surgery, 4 resolved on it's own  . Seasonal allergies   . Arthritis     lower back  . 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 PONV (postoperative nausea and vomiting)     states she had scop patch in past,states she sets off alarms when she is waking up, she "forgets to breathe"     Past Surgical History  Procedure Laterality Date  . Hammer toe surgery  4/12    lt foot-2-3  . Appendectomy    . Metatarsal osteotomy  3/12    lt  . Thumb fusion  2008    rt  . Carpal tunnel release      both  . Knee arthroscopy  2007    both  . Orif toe fracture  11/04/2011    Procedure: OPEN REDUCTION INTERNAL FIXATION (ORIF) METATARSAL (TOE) FRACTURE;  Surgeon: Wylene Simmer, MD;  Location:  [place];  Service: Orthopedics;  Laterality: Left;  left revision orif 1st metatarsal with autograft from left calcaneous and left 2nd-3rd metatarsal weil osteotomy  . 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 Dilation and curettage of uterus      x2  . Total knee arthroplasty  08/29/2012    Procedure: TOTAL KNEE ARTHROPLASTY;  Surgeon: Hessie Dibble, MD;  Location: [place];  Service: Orthopedics;  Laterality: Right;  . Other surgical history  05/08/13    hardware removed from left foot  . Bunionectomy  03/02/11 and 11/04/11    left foot x2  . Joint replacement      R- TKA- 2013  . Tubal ligation    . Total shoulder arthroplasty Right 05/09/2014    Procedure: TOTAL SHOULDER ARTHROPLASTY;  Surgeon: Nita Sells, MD;  Location: [place];  Service: Orthopedics;  Laterality: Right;  Right shoulder arthroplasty  . Lumbar laminectomy  05/1999    L4-L5  . Back surgery  09,00,10,99, 6/14,9/14    x6 surgeries on back  -Spinal Fusin L4-L5, Lumbar Fusion L1-S1  . Breast surgery      left cyst- benign    Current Outpatient Prescriptions  Medication Sig Dispense Refill  . amoxicillin-clavulanate (AUGMENTIN) 875-125 MG per tablet Take 1 tablet by mouth daily. Take one tablet BID x 7D.  10 tablet  0  . Ascorbic Acid (VITAMIN C PO) Take 1,000 mg by mouth daily.       Marland Kitchen aspirin EC 81 MG tablet Take 81 mg by mouth daily.      . Biotin 5000 MCG CAPS Take 1 capsule by mouth daily.      . Calcium Carbonate-Vit D-Min (CALCIUM 1200 PO) Take 1,200 mg by mouth daily.       . carvedilol (COREG) 6.25 MG tablet Take 6.25 mg by mouth 2 (two) times daily with a meal.      . diazepam (VALIUM) 5 MG tablet Take 5 mg by mouth every 8 (eight) hours as needed for muscle spasms (for back spasms after surgery).      . Flaxseed, Linseed, (FLAX SEED OIL PO) Take 2,000 mg by mouth daily.       . furosemide (LASIX) 40 MG tablet Take 40 mg by mouth daily.       Marland Kitchen gabapentin (NEURONTIN) 600 MG tablet Take 600 mg by mouth 3 (three) times daily.       Marland Kitchen levothyroxine (SYNTHROID, LEVOTHROID) 75 MCG tablet Take 75 mcg by mouth daily.       . methadone (DOLOPHINE) 5 MG tablet Take 5 mg by mouth 4 (four) times daily.      . Multiple Vitamin (MULTIVITAMIN WITH MINERALS) TABS Take 1 tablet by mouth daily.      . Multiple Vitamins-Minerals (PRESERVISION/LUTEIN PO) Take 1 tablet by mouth 2 (two) times daily.      Marland Kitchen nystatin-triamcinolone ointment (MYCOLOG) Apply 1 application topically 2 (two) times daily.  30 g  0  . pravastatin (PRAVACHOL) 20 MG tablet Take 20 mg by mouth at bedtime.       . senna (SENOKOT) 8.6 MG tablet Take 2-3 tablets by mouth 2 (two) times daily. 2 tablets in the morning, 3 tablets in the evening      . ampicillin (PRINCIPEN) 500 MG capsule Take 500 mg by mouth daily.      Marland Kitchen EPINEPHrine (EPI-PEN) 0.3 mg/0.3 mL DEVI Inject 0.3 mg into the muscle once. For various allergic reactions.      Marland Kitchen  HYDROmorphone (DILAUDID) 2 MG tablet  Take 1 tablet (2 mg total) by mouth every 4 (four) hours as needed.  30 tablet  0   No current facility-administered medications for this visit.     ALLERGIES: Flexeril; Red dye; Ciprofloxacin; Other; Tylenol; Eggs or egg-derived products; Latex; Lisinopril; Methocarbamol; Morphine and related; and Sulfa antibiotics  Family History  Problem Relation Age of Onset  . Hyperlipidemia Father   . Hypertension Father   . Heart attack Father 108    deceased    History   Social History  . Marital Status: Married    Spouse Name: N/A    Number of Children: N/A  . Years of Education: N/A   Occupational History  . Not on file.   Social History Main Topics  . Smoking status: Never Smoker   . Smokeless tobacco: Never Used     Comment: occ wine  . Alcohol Use: 0.5 oz/week    1 drink(s) per week     Comment: glass of wine   . Drug Use: No  . Sexual Activity: Yes    Partners: Male    Birth Control/ Protection: Post-menopausal   Other Topics Concern  . Not on file   Social History Narrative  . No narrative on file    ROS:  Pertinent items are noted in HPI.  PHYSICAL EXAMINATION:    BP 140/70  Pulse 70  Ht 5' 3.25" (1.607 m)  Wt 191 lb 6.4 oz (86.818 kg)  BMI 33.62 kg/m2  LMP 12/06/2000     General appearance: alert, cooperative and appears stated age Lungs: clear to auscultation bilaterally Heart: regular rate and rhythm Abdomen: umbilical and right lower quadrant incisions, soft, non-tender; no masses,  no organomegaly No abnormal inguinal nodes palpated  Pelvic: External genitalia:  no lesions              Urethra:  normal appearing urethra with no masses, tenderness or lesions              Bartholins and Skenes: normal                 Vagina: normal appearing vagina with normal color and discharge, no lesions.  Third degree cystocele and second degree rectocele.  First degree rectocele.              Cervix: normal appearance                   Bimanual Exam:  Uterus:   uterus is normal size, shape, consistency and nontender                                      Adnexa: normal adnexa in size, nontender and no masses                                      Rectovaginal:  Yes.                                                                Confirms  Anus:  normal sphincter tone, no lesions  ASSESSMENT  Complete uterovaginal prolapse.  Genuine stress incontinence.  Status post Proteus UTI with negative test of cure.  Yeast vulvitis.  Multiple back surgeries.  Pain control with methadone.   PLAN  Proceed with surgery.  Dr. Sabra Heck will be performing her total abdominal hysterectomy with bilateral salpingo-oophorectomy.  I will be performing an abdominosacrocolpopexy with permanent mesh, an anterior and posterior colporrhaphy, and TVT Exact with midurethral sling and cystoscopy.  Risks, benefits, and alternatives have been discussed with the patient who wishes to proceed.  Patient will take her Synthroid and Coreg on the day of surgery with a sip of water.  Continue Augmentin daily.  Mycolog II cream externally.   An After Visit Summary was printed and given to the patient.  _25_____ minutes face to face time of which over 50% was spent in counseling.

## 2014-09-27 NOTE — Patient Instructions (Signed)
Your procedure is scheduled on:10/07/14  Enter through the Main Entrance at :0730 am Pick up desk phone and dial [phone] and inform us of your arrival.  Please call [phone] if you have any problems the morning of surgery.  Remember: Do not eat food or drink liquids, including water, after midnight: Sunday    You may brush your teeth the morning of surgery.  Take these meds the morning of surgery with a sip of water:Synthroid, Carvedilol  DO NOT wear jewelry, eye make-up, lipstick,body lotion, or dark fingernail polish.  (Polished toes are ok) You may wear deodorant.  If you are to be admitted after surgery, leave suitcase in car until your room has been assigned. Patients discharged on the day of surgery will not be allowed to drive home. Wear loose fitting, comfortable clothes for your ride home.

## 2014-09-29 ENCOUNTER — Encounter: Payer: Self-pay | Admitting: Obstetrics & Gynecology

## 2014-09-30 ENCOUNTER — Encounter (HOSPITAL_COMMUNITY): Payer: Self-pay | Admitting: Pharmacist

## 2014-10-02 ENCOUNTER — Ambulatory Visit: Payer: Medicare Other | Admitting: Obstetrics and Gynecology

## 2014-10-03 ENCOUNTER — Telehealth: Payer: Self-pay | Admitting: Obstetrics & Gynecology

## 2014-10-03 NOTE — Telephone Encounter (Signed)
Fleets enema OK for the night prior to surgery.

## 2014-10-03 NOTE — Telephone Encounter (Signed)
Patient calling to confirm bowel prep instructions. Advised fleet enema evening before procedure, will confirm with MD. Patient states ok to leave message on VM. Any changes to above?

## 2014-10-03 NOTE — Telephone Encounter (Signed)
Patient calling with questions about upcoming surgery and instructions. She is at the beach and doesn't have it.

## 2014-10-04 NOTE — Telephone Encounter (Signed)
Patient notified. Encounter closed

## 2014-10-06 MED ORDER — DEXTROSE 5 % IV SOLN
2.0000 g | INTRAVENOUS | Status: AC
  Administered 2014-10-07: 2 g via INTRAVENOUS
  Filled 2014-10-06: qty 2

## 2014-10-07 ENCOUNTER — Encounter (HOSPITAL_COMMUNITY)
Admission: RE | Disposition: A | Discharge status: Home / Self Care | Payer: Self-pay | Source: Ambulatory Visit | Attending: Obstetrics & Gynecology

## 2014-10-07 ENCOUNTER — Inpatient Hospital Stay (HOSPITAL_COMMUNITY)
Admission: RE | Admit: 2014-10-07 | Discharge: 2014-10-10 | DRG: 742 | Disposition: A | Discharge status: Home / Self Care | Payer: Medicare Other | Source: Ambulatory Visit | Attending: Obstetrics & Gynecology | Admitting: Obstetrics & Gynecology

## 2014-10-07 ENCOUNTER — Inpatient Hospital Stay (HOSPITAL_COMMUNITY): Payer: Medicare Other | Admitting: Anesthesiology

## 2014-10-07 ENCOUNTER — Encounter (HOSPITAL_COMMUNITY): Payer: Self-pay | Admitting: Certified Registered"

## 2014-10-07 DIAGNOSIS — R110 Nausea: Secondary | ICD-10-CM | POA: Diagnosis not present

## 2014-10-07 DIAGNOSIS — M1711 Unilateral primary osteoarthritis, right knee: Secondary | ICD-10-CM | POA: Diagnosis present

## 2014-10-07 DIAGNOSIS — Z90710 Acquired absence of both cervix and uterus: Secondary | ICD-10-CM | POA: Diagnosis present

## 2014-10-07 DIAGNOSIS — Z881 Allergy status to other antibiotic agents status: Secondary | ICD-10-CM

## 2014-10-07 DIAGNOSIS — R682 Dry mouth, unspecified: Secondary | ICD-10-CM | POA: Diagnosis not present

## 2014-10-07 DIAGNOSIS — I10 Essential (primary) hypertension: Secondary | ICD-10-CM | POA: Diagnosis present

## 2014-10-07 DIAGNOSIS — M4697 Unspecified inflammatory spondylopathy, lumbosacral region: Secondary | ICD-10-CM | POA: Diagnosis present

## 2014-10-07 DIAGNOSIS — Z96651 Presence of right artificial knee joint: Secondary | ICD-10-CM | POA: Diagnosis present

## 2014-10-07 DIAGNOSIS — Z79899 Other long term (current) drug therapy: Secondary | ICD-10-CM

## 2014-10-07 DIAGNOSIS — L03311 Cellulitis of abdominal wall: Secondary | ICD-10-CM | POA: Diagnosis not present

## 2014-10-07 DIAGNOSIS — E039 Hypothyroidism, unspecified: Secondary | ICD-10-CM | POA: Diagnosis present

## 2014-10-07 DIAGNOSIS — Z7982 Long term (current) use of aspirin: Secondary | ICD-10-CM | POA: Diagnosis not present

## 2014-10-07 DIAGNOSIS — K219 Gastro-esophageal reflux disease without esophagitis: Secondary | ICD-10-CM | POA: Diagnosis present

## 2014-10-07 DIAGNOSIS — N393 Stress incontinence (female) (male): Secondary | ICD-10-CM | POA: Diagnosis present

## 2014-10-07 DIAGNOSIS — D259 Leiomyoma of uterus, unspecified: Secondary | ICD-10-CM | POA: Diagnosis not present

## 2014-10-07 DIAGNOSIS — N813 Complete uterovaginal prolapse: Principal | ICD-10-CM | POA: Diagnosis present

## 2014-10-07 DIAGNOSIS — R339 Retention of urine, unspecified: Secondary | ICD-10-CM | POA: Diagnosis not present

## 2014-10-07 DIAGNOSIS — G8929 Other chronic pain: Secondary | ICD-10-CM | POA: Diagnosis present

## 2014-10-07 DIAGNOSIS — Z9102 Food additives allergy status: Secondary | ICD-10-CM | POA: Diagnosis not present

## 2014-10-07 DIAGNOSIS — Z885 Allergy status to narcotic agent status: Secondary | ICD-10-CM

## 2014-10-07 DIAGNOSIS — I9581 Postprocedural hypotension: Secondary | ICD-10-CM | POA: Diagnosis not present

## 2014-10-07 DIAGNOSIS — D62 Acute posthemorrhagic anemia: Secondary | ICD-10-CM | POA: Diagnosis not present

## 2014-10-07 DIAGNOSIS — Z91040 Latex allergy status: Secondary | ICD-10-CM

## 2014-10-07 DIAGNOSIS — T814XXA Infection following a procedure, initial encounter: Secondary | ICD-10-CM | POA: Diagnosis not present

## 2014-10-07 DIAGNOSIS — Y838 Other surgical procedures as the cause of abnormal reaction of the patient, or of later complication, without mention of misadventure at the time of the procedure: Secondary | ICD-10-CM | POA: Diagnosis not present

## 2014-10-07 DIAGNOSIS — Z9889 Other specified postprocedural states: Secondary | ICD-10-CM | POA: Diagnosis not present

## 2014-10-07 DIAGNOSIS — Z888 Allergy status to other drugs, medicaments and biological substances status: Secondary | ICD-10-CM | POA: Diagnosis not present

## 2014-10-07 DIAGNOSIS — Z882 Allergy status to sulfonamides status: Secondary | ICD-10-CM | POA: Diagnosis not present

## 2014-10-07 DIAGNOSIS — M19019 Primary osteoarthritis, unspecified shoulder: Secondary | ICD-10-CM | POA: Diagnosis present

## 2014-10-07 DIAGNOSIS — N819 Female genital prolapse, unspecified: Secondary | ICD-10-CM | POA: Diagnosis present

## 2014-10-07 DIAGNOSIS — R42 Dizziness and giddiness: Secondary | ICD-10-CM | POA: Diagnosis not present

## 2014-10-07 DIAGNOSIS — Z91012 Allergy to eggs: Secondary | ICD-10-CM

## 2014-10-07 DIAGNOSIS — Z8249 Family history of ischemic heart disease and other diseases of the circulatory system: Secondary | ICD-10-CM

## 2014-10-07 DIAGNOSIS — M1990 Unspecified osteoarthritis, unspecified site: Secondary | ICD-10-CM | POA: Diagnosis not present

## 2014-10-07 DIAGNOSIS — N814 Uterovaginal prolapse, unspecified: Secondary | ICD-10-CM | POA: Diagnosis not present

## 2014-10-07 DIAGNOSIS — N8189 Other female genital prolapse: Secondary | ICD-10-CM

## 2014-10-07 DIAGNOSIS — N8502 Endometrial intraepithelial neoplasia [EIN]: Secondary | ICD-10-CM | POA: Diagnosis present

## 2014-10-07 DIAGNOSIS — R1030 Lower abdominal pain, unspecified: Secondary | ICD-10-CM | POA: Diagnosis not present

## 2014-10-07 DIAGNOSIS — D649 Anemia, unspecified: Secondary | ICD-10-CM | POA: Diagnosis not present

## 2014-10-07 DIAGNOSIS — J302 Other seasonal allergic rhinitis: Secondary | ICD-10-CM | POA: Diagnosis present

## 2014-10-07 DIAGNOSIS — M549 Dorsalgia, unspecified: Secondary | ICD-10-CM | POA: Diagnosis present

## 2014-10-07 DIAGNOSIS — G709 Myoneural disorder, unspecified: Secondary | ICD-10-CM | POA: Diagnosis present

## 2014-10-07 HISTORY — PX: ANTERIOR AND POSTERIOR REPAIR: SHX5121

## 2014-10-07 HISTORY — PX: BLADDER SUSPENSION: SHX72

## 2014-10-07 HISTORY — PX: ABDOMINAL HYSTERECTOMY: SHX81

## 2014-10-07 HISTORY — PX: CYSTOSCOPY: SHX5120

## 2014-10-07 LAB — BASIC METABOLIC PANEL
ANION GAP: 13 (ref 5–15)
Anion gap: 12 (ref 5–15)
BUN: 15 mg/dL (ref 6–23)
BUN: 13 mg/dL (ref 6–23)
CALCIUM: 8.7 mg/dL (ref 8.4–10.5)
CALCIUM: 8.5 mg/dL (ref 8.4–10.5)
CHLORIDE: 101 meq/L (ref 96–112)
CO2: 27 mEq/L (ref 19–32)
CO2: 24 mEq/L (ref 19–32)
CREATININE: 0.72 mg/dL (ref 0.50–1.10)
Chloride: 102 mEq/L (ref 96–112)
Creatinine, Ser: 0.69 mg/dL (ref 0.50–1.10)
GFR calc Af Amer: >90 mL/min (ref >90)
GFR calc Af Amer: >90 mL/min (ref >90)
GFR calc non Af Amer: 84 mL/min — ABNORMAL LOW (ref >90)
GFR calc non Af Amer: 85 mL/min — ABNORMAL LOW (ref >90)
GLUCOSE: 100 mg/dL — AB (ref 70–99)
Glucose, Bld: 139 mg/dL — ABNORMAL HIGH (ref 70–99)
POTASSIUM: 3.9 meq/L (ref 3.7–5.3)
Potassium: 3.8 mEq/L (ref 3.7–5.3)
Sodium: 140 mEq/L (ref 137–147)
Sodium: 139 mEq/L (ref 137–147)

## 2014-10-07 LAB — CBC
HCT: 36.4 % (ref 36.0–46.0)
HEMOGLOBIN: 11.9 g/dL — AB (ref 12.0–15.0)
MCH: 29.7 pg (ref 26.0–34.0)
MCHC: 32.7 g/dL (ref 30.0–36.0)
MCV: 90.8 fL (ref 78.0–100.0)
PLATELETS: 191 10*3/uL (ref 150–400)
RBC: 4.01 MIL/uL (ref 3.87–5.11)
RDW: 15.5 % (ref 11.5–15.5)
WBC: 10.9 10*3/uL — AB (ref 4.0–10.5)

## 2014-10-07 SURGERY — HYSTERECTOMY, ABDOMINAL
Anesthesia: Epidural | Site: Vagina

## 2014-10-07 MED ORDER — DEXAMETHASONE SODIUM PHOSPHATE 10 MG/ML IJ SOLN
INTRAMUSCULAR | Status: AC
  Filled 2014-10-07: qty 1

## 2014-10-07 MED ORDER — KETOROLAC TROMETHAMINE 30 MG/ML IJ SOLN
INTRAMUSCULAR | Status: AC
  Filled 2014-10-07: qty 1

## 2014-10-07 MED ORDER — NALBUPHINE HCL 10 MG/ML IJ SOLN: 5.0000 mg | INTRAMUSCULAR | Status: DC | PRN

## 2014-10-07 MED ORDER — DEXAMETHASONE SODIUM PHOSPHATE 10 MG/ML IJ SOLN
INTRAMUSCULAR | Status: DC | PRN
  Administered 2014-10-07: 4 mg via INTRAVENOUS

## 2014-10-07 MED ORDER — SIMETHICONE 80 MG PO CHEW: 80.0000 mg | CHEWABLE_TABLET | Freq: Four times a day (QID) | ORAL | Status: DC | PRN

## 2014-10-07 MED ORDER — ROCURONIUM BROMIDE 100 MG/10ML IV SOLN
INTRAVENOUS | Status: AC
  Filled 2014-10-07: qty 1

## 2014-10-07 MED ORDER — ROPIVACAINE HCL 5 MG/ML IJ SOLN
INTRAMUSCULAR | Status: AC
  Filled 2014-10-07: qty 30

## 2014-10-07 MED ORDER — GLYCOPYRROLATE 0.2 MG/ML IJ SOLN
INTRAMUSCULAR | Status: AC
  Filled 2014-10-07: qty 1

## 2014-10-07 MED ORDER — NEOSTIGMINE METHYLSULFATE 10 MG/10ML IV SOLN
INTRAVENOUS | Status: AC
  Filled 2014-10-07: qty 1

## 2014-10-07 MED ORDER — GABAPENTIN 300 MG PO CAPS
600.0000 mg | ORAL_CAPSULE | Freq: Once | ORAL | Status: AC
  Administered 2014-10-07: 600 mg via ORAL

## 2014-10-07 MED ORDER — SCOPOLAMINE 1 MG/3DAYS TD PT72
1.0000 | MEDICATED_PATCH | Freq: Once | TRANSDERMAL | Status: AC
  Administered 2014-10-07: 1.5 mg via TRANSDERMAL

## 2014-10-07 MED ORDER — DIPHENHYDRAMINE HCL 50 MG/ML IJ SOLN: 12.5000 mg | INTRAMUSCULAR | Status: DC | PRN

## 2014-10-07 MED ORDER — MIDAZOLAM HCL 2 MG/2ML IJ SOLN
INTRAMUSCULAR | Status: DC | PRN
  Administered 2014-10-07 (×2): 1 mg via INTRAVENOUS

## 2014-10-07 MED ORDER — PHENYLEPHRINE 8 MG IN D5W 100 ML (0.08MG/ML) PREMIX OPTIME
INJECTION | INTRAVENOUS | Status: DC | PRN
  Administered 2014-10-07: 80 ug/min via INTRAVENOUS

## 2014-10-07 MED ORDER — LIDOCAINE-EPINEPHRINE 0.5 %-1:200000 IJ SOLN
INTRAMUSCULAR | Status: AC
  Filled 2014-10-07: qty 1

## 2014-10-07 MED ORDER — DEXTROSE 5 % IV SOLN
2.0000 g | Freq: Once | INTRAVENOUS | Status: AC
  Administered 2014-10-07: 2 g via INTRAVENOUS
  Filled 2014-10-07: qty 2

## 2014-10-07 MED ORDER — MENTHOL 3 MG MT LOZG: 1.0000 | LOZENGE | OROMUCOSAL | Status: DC | PRN

## 2014-10-07 MED ORDER — GLYCOPYRROLATE 0.2 MG/ML IJ SOLN
INTRAMUSCULAR | Status: AC
  Filled 2014-10-07: qty 3

## 2014-10-07 MED ORDER — LACTATED RINGERS IV SOLN
INTRAVENOUS | Status: DC
  Administered 2014-10-07: 08:00:00 via INTRAVENOUS

## 2014-10-07 MED ORDER — PHENYLEPHRINE HCL 10 MG/ML IJ SOLN
INTRAMUSCULAR | Status: AC
  Filled 2014-10-07: qty 1

## 2014-10-07 MED ORDER — ONDANSETRON HCL 4 MG/2ML IJ SOLN
INTRAMUSCULAR | Status: AC
  Filled 2014-10-07: qty 2

## 2014-10-07 MED ORDER — NALOXONE HCL 0.4 MG/ML IJ SOLN: 0.4000 mg | INTRAMUSCULAR | Status: DC | PRN

## 2014-10-07 MED ORDER — MEPERIDINE HCL 25 MG/ML IJ SOLN: 6.2500 mg | INTRAMUSCULAR | Status: DC | PRN

## 2014-10-07 MED ORDER — PROPOFOL 10 MG/ML IV EMUL
INTRAVENOUS | Status: AC
  Filled 2014-10-07: qty 20

## 2014-10-07 MED ORDER — FUROSEMIDE 40 MG PO TABS
40.0000 mg | ORAL_TABLET | Freq: Every day | ORAL | Status: DC
  Administered 2014-10-08 – 2014-10-10 (×3): 40 mg via ORAL
  Filled 2014-10-07 (×4): qty 1

## 2014-10-07 MED ORDER — DEXTROSE-NACL 5-0.45 % IV SOLN
INTRAVENOUS | Status: DC
  Administered 2014-10-07 – 2014-10-08 (×2): via INTRAVENOUS

## 2014-10-07 MED ORDER — SCOPOLAMINE 1 MG/3DAYS TD PT72
MEDICATED_PATCH | TRANSDERMAL | Status: AC
  Administered 2014-10-07: 1.5 mg via TRANSDERMAL
  Filled 2014-10-07: qty 1

## 2014-10-07 MED ORDER — LACTATED RINGERS IV SOLN
INTRAVENOUS | Status: DC
  Administered 2014-10-07 (×3): via INTRAVENOUS

## 2014-10-07 MED ORDER — LIDOCAINE HCL (CARDIAC) 20 MG/ML IV SOLN
INTRAVENOUS | Status: AC
  Filled 2014-10-07: qty 5

## 2014-10-07 MED ORDER — LEVOTHYROXINE SODIUM 75 MCG PO TABS
75.0000 ug | ORAL_TABLET | Freq: Every day | ORAL | Status: DC
  Administered 2014-10-08 – 2014-10-10 (×3): 75 ug via ORAL
  Filled 2014-10-07 (×4): qty 1

## 2014-10-07 MED ORDER — ACETAMINOPHEN 10 MG/ML IV SOLN
1000.0000 mg | Freq: Once | INTRAVENOUS | Status: AC
Start: 2014-10-07 — End: 2014-10-07
  Administered 2014-10-07: 1000 mg via INTRAVENOUS
  Filled 2014-10-07: qty 100

## 2014-10-07 MED ORDER — ESTRADIOL 0.1 MG/GM VA CREA
TOPICAL_CREAM | VAGINAL | Status: DC | PRN
  Administered 2014-10-07: 1 via VAGINAL

## 2014-10-07 MED ORDER — HYDROMORPHONE HCL 1 MG/ML IJ SOLN: 0.2500 mg | INTRAMUSCULAR | Status: DC | PRN

## 2014-10-07 MED ORDER — ONDANSETRON HCL 4 MG/2ML IJ SOLN
4.0000 mg | Freq: Three times a day (TID) | INTRAMUSCULAR | Status: DC | PRN
  Administered 2014-10-08 – 2014-10-09 (×3): 4 mg via INTRAVENOUS
  Filled 2014-10-07 (×3): qty 2

## 2014-10-07 MED ORDER — PANTOPRAZOLE SODIUM 40 MG IV SOLR
40.0000 mg | Freq: Every day | INTRAVENOUS | Status: DC
  Administered 2014-10-07 – 2014-10-09 (×3): 40 mg via INTRAVENOUS
  Filled 2014-10-07 (×4): qty 40

## 2014-10-07 MED ORDER — LIDOCAINE-EPINEPHRINE (PF) 2 %-1:200000 IJ SOLN
INTRAMUSCULAR | Status: DC | PRN
  Administered 2014-10-07 (×2): 3 mL

## 2014-10-07 MED ORDER — STERILE WATER FOR IRRIGATION IR SOLN
Status: DC | PRN
  Administered 2014-10-07: 1000 mL via INTRAVESICAL

## 2014-10-07 MED ORDER — FENTANYL CITRATE 0.05 MG/ML IJ SOLN
INTRAMUSCULAR | Status: AC
Start: 2014-10-07 — End: 2014-10-07
  Filled 2014-10-07: qty 5

## 2014-10-07 MED ORDER — SODIUM CHLORIDE 0.9 % IV SOLN
INTRAVENOUS | Status: DC
  Administered 2014-10-07: 16:00:00 via EPIDURAL
  Filled 2014-10-07 (×2): qty 20

## 2014-10-07 MED ORDER — ONDANSETRON HCL 4 MG/2ML IJ SOLN
INTRAMUSCULAR | Status: DC | PRN
  Administered 2014-10-07: 4 mg via INTRAVENOUS

## 2014-10-07 MED ORDER — NEOSTIGMINE METHYLSULFATE 10 MG/10ML IV SOLN
INTRAVENOUS | Status: DC | PRN
  Administered 2014-10-07: 2 mg via INTRAVENOUS

## 2014-10-07 MED ORDER — LIDOCAINE HCL (CARDIAC) 20 MG/ML IV SOLN
INTRAVENOUS | Status: DC | PRN
  Administered 2014-10-07: 80 mg via INTRAVENOUS

## 2014-10-07 MED ORDER — PROPOFOL 10 MG/ML IV BOLUS
INTRAVENOUS | Status: DC | PRN
  Administered 2014-10-07: 100 mg via INTRAVENOUS

## 2014-10-07 MED ORDER — MIDAZOLAM HCL 2 MG/2ML IJ SOLN: 1.0000 mg | Freq: Once | INTRAMUSCULAR | Status: DC | PRN

## 2014-10-07 MED ORDER — LACTATED RINGERS IV SOLN: INTRAVENOUS | Status: DC

## 2014-10-07 MED ORDER — SODIUM CHLORIDE 0.9 % IR SOLN
Status: DC | PRN
  Administered 2014-10-07 (×2): 1000 mL

## 2014-10-07 MED ORDER — CARVEDILOL 6.25 MG PO TABS
6.2500 mg | ORAL_TABLET | Freq: Two times a day (BID) | ORAL | Status: DC
  Administered 2014-10-08 (×2): 6.25 mg via ORAL
  Filled 2014-10-07 (×5): qty 1

## 2014-10-07 MED ORDER — GLYCOPYRROLATE 0.2 MG/ML IJ SOLN
INTRAMUSCULAR | Status: DC | PRN
  Administered 2014-10-07: 0.4 mg via INTRAVENOUS
  Administered 2014-10-07: 0.2 mg via INTRAVENOUS

## 2014-10-07 MED ORDER — METHADONE HCL 5 MG PO TABS
5.0000 mg | ORAL_TABLET | Freq: Once | ORAL | Status: AC
  Administered 2014-10-07: 5 mg via ORAL

## 2014-10-07 MED ORDER — NALBUPHINE HCL 10 MG/ML IJ SOLN: 5.0000 mg | Freq: Once | INTRAMUSCULAR | Status: DC | PRN

## 2014-10-07 MED ORDER — PHENYLEPHRINE 40 MCG/ML (10ML) SYRINGE FOR IV PUSH (FOR BLOOD PRESSURE SUPPORT)
PREFILLED_SYRINGE | INTRAVENOUS | Status: AC
  Filled 2014-10-07: qty 5

## 2014-10-07 MED ORDER — LIDOCAINE-EPINEPHRINE 1 %-1:100000 IJ SOLN
INTRAMUSCULAR | Status: AC
  Filled 2014-10-07: qty 1

## 2014-10-07 MED ORDER — ESTRADIOL 0.1 MG/GM VA CREA
TOPICAL_CREAM | VAGINAL | Status: AC
  Filled 2014-10-07: qty 42.5

## 2014-10-07 MED ORDER — NALOXONE HCL 1 MG/ML IJ SOLN: 1.0000 ug/kg/h | INTRAVENOUS | Status: DC | PRN

## 2014-10-07 MED ORDER — MIDAZOLAM HCL 2 MG/2ML IJ SOLN
INTRAMUSCULAR | Status: AC
  Filled 2014-10-07: qty 2

## 2014-10-07 MED ORDER — LIDOCAINE-EPINEPHRINE 1 %-1:100000 IJ SOLN
INTRAMUSCULAR | Status: DC | PRN
  Administered 2014-10-07: 24 mL

## 2014-10-07 MED ORDER — GABAPENTIN 300 MG PO CAPS
ORAL_CAPSULE | ORAL | Status: AC
  Filled 2014-10-07: qty 2

## 2014-10-07 MED ORDER — SODIUM CHLORIDE 0.9 % IJ SOLN: 3.0000 mL | INTRAMUSCULAR | Status: DC | PRN

## 2014-10-07 MED ORDER — PHENYLEPHRINE 40 MCG/ML (10ML) SYRINGE FOR IV PUSH (FOR BLOOD PRESSURE SUPPORT)
PREFILLED_SYRINGE | INTRAVENOUS | Status: AC
Start: 2014-10-07 — End: 2014-10-07
  Filled 2014-10-07: qty 15

## 2014-10-07 MED ORDER — ROCURONIUM BROMIDE 100 MG/10ML IV SOLN
INTRAVENOUS | Status: DC | PRN
  Administered 2014-10-07: 10 mg via INTRAVENOUS
  Administered 2014-10-07: 50 mg via INTRAVENOUS
  Administered 2014-10-07 (×2): 10 mg via INTRAVENOUS

## 2014-10-07 MED ORDER — ACETAMINOPHEN 325 MG PO TABS: 650.0000 mg | ORAL_TABLET | ORAL | Status: DC | PRN

## 2014-10-07 MED ORDER — ACETAMINOPHEN 500 MG PO TABS: 1000.0000 mg | ORAL_TABLET | Freq: Four times a day (QID) | ORAL | Status: DC

## 2014-10-07 MED ORDER — DIPHENHYDRAMINE HCL 25 MG PO CAPS
25.0000 mg | ORAL_CAPSULE | ORAL | Status: DC | PRN
  Administered 2014-10-09: 25 mg via ORAL
  Filled 2014-10-07: qty 1

## 2014-10-07 MED ORDER — FENTANYL CITRATE 0.05 MG/ML IJ SOLN
INTRAMUSCULAR | Status: DC | PRN
  Administered 2014-10-07 (×2): 50 ug via INTRAVENOUS

## 2014-10-07 MED ORDER — PHENYLEPHRINE HCL 10 MG/ML IJ SOLN
INTRAMUSCULAR | Status: DC | PRN
  Administered 2014-10-07 (×8): 80 ug via INTRAVENOUS
  Administered 2014-10-07: 40 ug via INTRAVENOUS

## 2014-10-07 SURGICAL SUPPLY — 62 items
APL SKNCLS STERI-STRIP NONHPOA (GAUZE/BANDAGES/DRESSINGS)
BENZOIN TINCTURE PRP APPL 2/3 (GAUZE/BANDAGES/DRESSINGS) ×2 IMPLANT
CANISTER SUCT 3000ML (MISCELLANEOUS) ×6 IMPLANT
CLOSURE WOUND 1/2 X4 (GAUZE/BANDAGES/DRESSINGS)
CLOTH BEACON ORANGE TIMEOUT ST (SAFETY) ×6 IMPLANT
CONT PATH 16OZ SNAP LID 3702 (MISCELLANEOUS) IMPLANT
DECANTER SPIKE VIAL GLASS SM (MISCELLANEOUS) ×3 IMPLANT
DISSECTOR SPONGE CHERRY (GAUZE/BANDAGES/DRESSINGS) ×4 IMPLANT
DRAPE WARM FLUID 44X44 (DRAPE) ×3 IMPLANT
DRSG OPSITE POSTOP 4X10 (GAUZE/BANDAGES/DRESSINGS) ×6 IMPLANT
DURAPREP 26ML APPLICATOR (WOUND CARE) ×6 IMPLANT
ELECT BLADE 6 FLAT ULTRCLN (ELECTRODE) ×4 IMPLANT
GAUZE PACKING 2X5 YD STRL (GAUZE/BANDAGES/DRESSINGS) ×4 IMPLANT
GAUZE SPONGE 4X4 16PLY XRAY LF (GAUZE/BANDAGES/DRESSINGS) ×6 IMPLANT
GLOVE BIOGEL PI IND STRL 7.0 (GLOVE) ×14 IMPLANT
GLOVE BIOGEL PI INDICATOR 7.0 (GLOVE) ×10
GLOVE SURG SS PI 6.5 STRL IVOR (GLOVE) ×9 IMPLANT
GLOVE SURG SS PI 7.0 STRL IVOR (GLOVE) ×3 IMPLANT
GOWN STRL REUS W/TWL LRG LVL3 (GOWN DISPOSABLE) ×33 IMPLANT
GOWN STRL REUS W/TWL XL LVL3 (GOWN DISPOSABLE) ×3 IMPLANT
LIQUID BAND (GAUZE/BANDAGES/DRESSINGS) ×4 IMPLANT
NDL HYPO 25X1 1.5 SAFETY (NEEDLE) ×2 IMPLANT
NDL SPNL 22GX3.5 QUINCKE BK (NEEDLE) IMPLANT
NEEDLE HYPO 22GX1.5 SAFETY (NEEDLE) IMPLANT
NEEDLE HYPO 25X1 1.5 SAFETY (NEEDLE) ×6 IMPLANT
NEEDLE SPNL 22GX3.5 QUINCKE BK (NEEDLE) IMPLANT
NS IRRIG 1000ML POUR BTL (IV SOLUTION) ×6 IMPLANT
PACK ABDOMINAL GYN (CUSTOM PROCEDURE TRAY) ×6 IMPLANT
PACK VAGINAL WOMENS (CUSTOM PROCEDURE TRAY) ×6 IMPLANT
PAD ABD 7.5X8 STRL (GAUZE/BANDAGES/DRESSINGS) IMPLANT
PAD OB MATERNITY 4.3X12.25 (PERSONAL CARE ITEMS) ×6 IMPLANT
PROTECTOR NERVE ULNAR (MISCELLANEOUS) ×6 IMPLANT
SET CYSTO W/LG BORE CLAMP LF (SET/KITS/TRAYS/PACK) ×4 IMPLANT
SLEEVE SURGEON STRL (DRAPES) ×3 IMPLANT
SLING TVT EXACT (Sling) ×5 IMPLANT
SPONGE GAUZE 2X2 8PLY STER LF (GAUZE/BANDAGES/DRESSINGS)
SPONGE GAUZE 2X2 8PLY STRL LF (GAUZE/BANDAGES/DRESSINGS) IMPLANT
SPONGE GAUZE 4X4 12PLY STER LF (GAUZE/BANDAGES/DRESSINGS) ×12 IMPLANT
SPONGE LAP 18X18 X RAY DECT (DISPOSABLE) ×12 IMPLANT
SPONGE SURGIFOAM ABS GEL 12-7 (HEMOSTASIS) IMPLANT
STRIP CLOSURE SKIN 1/2X4 (GAUZE/BANDAGES/DRESSINGS) IMPLANT
SUT ETHIBOND 0 (SUTURE) ×3 IMPLANT
SUT PDS AB 0 CT1 27 (SUTURE) ×8 IMPLANT
SUT PLAIN 2 0 XLH (SUTURE) ×3 IMPLANT
SUT VIC AB 0 CT1 18XCR BRD8 (SUTURE) ×13 IMPLANT
SUT VIC AB 0 CT1 27 (SUTURE) ×18
SUT VIC AB 0 CT1 27XBRD ANBCTR (SUTURE) ×9 IMPLANT
SUT VIC AB 0 CT1 8-18 (SUTURE) ×24
SUT VIC AB 2-0 CT1 (SUTURE) ×12 IMPLANT
SUT VIC AB 2-0 CT1 27 (SUTURE) ×6
SUT VIC AB 2-0 CT1 TAPERPNT 27 (SUTURE) ×4 IMPLANT
SUT VIC AB 2-0 CT2 27 (SUTURE) ×9 IMPLANT
SUT VIC AB 2-0 SH 27 (SUTURE) ×18
SUT VIC AB 2-0 SH 27XBRD (SUTURE) ×9 IMPLANT
SUT VIC AB 3-0 PS2 18 (SUTURE) ×12 IMPLANT
SUT VIC AB 3-0 PS2 18XBRD (SUTURE) ×4 IMPLANT
SUT VICRYL 0 TIES 12 18 (SUTURE) ×6 IMPLANT
SYRINGE 10CC LL (SYRINGE) ×3 IMPLANT
TOWEL OR 17X24 6PK STRL BLUE (TOWEL DISPOSABLE) ×12 IMPLANT
TRAY FOLEY BAG SILVER LF 16FR (CATHETERS) ×3 IMPLANT
TRAY FOLEY CATH 14FR (SET/KITS/TRAYS/PACK) ×2 IMPLANT
WATER STERILE IRR 1000ML POUR (IV SOLUTION) ×6 IMPLANT

## 2014-10-07 NOTE — H&P (Signed)
Progress Notes         [name] Ida E Amundson de Berton Lan, MD at 09/27/2014  1:32 PM       Status: Signed        Expand All Collapse All   Patient ID: [name] [name], female   DOB: June 17, 1941, 73 y.o.   MRN: [id number] GYNECOLOGY  VISIT   HPI: 73 y.o.   Married  Caucasian  female    G60P2 with Patient's last menstrual period was 12/06/2000.   here to be evaluated for upcoming surgery for complete uterovaginal prolapse and genuine stress incontinence documented with urodynamic testing.   Desires surgical care.   Planning hysterectomy with Dr. Sabra Heck, her primary gynecologist and prolapse and incontinence surgery with me concurrently.   Taking Augmentin for UTI prophylaxis.   Had a Proteus UTI following urodynamics and was treated with Amoxicillin.   On post abx urine test of cure, patient had WBCs noted on both clean catch and then catheter specimen, and surgery was cancelled for 09/17/14. Ultimately, the urine culture was negative.   Today declines dysuria.   Some vulvar irritation.   Has Mycolog II at home.  Has used a walker in the past.   States that she lists to the left.   No changes in health history since prior preop visit.   Left foot numbness due to spinal problems.  Heel and outer edges.  Big toe sensation intact per patient. History of multiple back surgeries and chronic pain managed with Methadone.   GYNECOLOGIC HISTORY: Patient's last menstrual period was 12/06/2000. Contraception: postmenopausal    Menopausal hormone therapy: none         OB History     Grav  Para  Term  Preterm  Abortions  TAB  SAB  Ect  Mult  Living     9  2                2              Patient Active Problem List     Diagnosis  Date Noted   .  Cystocele  08/16/2014   .  Glenohumeral arthritis  05/09/2014   .  Right knee DJD  08/29/2012       Class: Chronic       Past Medical History   Diagnosis  Date   .  Hypertension     .  Hypothyroidism     .  GERD (gastroesophageal reflux disease)      .  Chronic pain         lower back   .  Bronchitis         history only   .  Ulcer         hx   .  Virus not detected  2010       postop, caught virus in hosp., respiratory symptoms, upon d/c to home, had allergic reaction /w plate size hives - not sure of the cause     .  Sinus infection         recent sinus infection, finished Zpak- 08/16/2013   .  Female bladder prolapse     .  Anemia         took iron for a while   .  SVD (spontaneous vaginal delivery)         x 2   .  Missed ab         x 6 - 2 surgery,  4 resolved on it's own   .  Seasonal allergies     .  Arthritis         lower back   .  Neuromuscular disorder         , left foot has some numbeness- uses cane   .  PONV (postoperative nausea and vomiting)         states she had scop patch in past,states she sets off alarms when she is waking up, she "forgets to breathe"        Past Surgical History   Procedure  Laterality  Date   .  Hammer toe surgery    4/12       lt foot-2-3   .  Appendectomy       .  Metatarsal osteotomy    3/12       lt   .  Thumb fusion    2008       rt   .  Carpal tunnel release           both   .  Knee arthroscopy    2007       both   .  Orif toe fracture    11/04/2011       Procedure: OPEN REDUCTION INTERNAL FIXATION (ORIF) METATARSAL (TOE) FRACTURE;  Surgeon: Wylene Simmer, MD;  Location: [facility];  Service: Orthopedics;  Laterality: Left;  left revision orif 1st metatarsal with autograft from left calcaneous and left 2nd-3rd metatarsal weil osteotomy   .  Osteoma    12       forehead x2   .  Dilation and curettage of uterus           x2   .  Total knee arthroplasty    08/29/2012       Procedure: TOTAL KNEE ARTHROPLASTY;  Surgeon: Hessie Dibble, MD;  Location: [facility];  Service: Orthopedics;  Laterality: Right;   .  Other surgical history    05/08/13       hardware removed from left foot   .  Bunionectomy    03/02/11 and 11/04/11       left foot x2   .  Joint  replacement           R- TKA- 2013   .  Tubal ligation       .  Total shoulder arthroplasty  Right  05/09/2014       Procedure: TOTAL SHOULDER ARTHROPLASTY;  Surgeon: Nita Sells, MD;  Location: [facility];  Service: Orthopedics;  Laterality: Right;  Right shoulder arthroplasty   .  Lumbar laminectomy    05/1999       L4-L5   .  Back surgery    09,00,10,99, 6/14,9/14       x6 surgeries on back -Spinal Fusin L4-L5, Lumbar Fusion L1-S1   .  Breast surgery           left cyst- benign       Current Outpatient Prescriptions   Medication  Sig  Dispense  Refill   .  amoxicillin-clavulanate (AUGMENTIN) 875-125 MG per tablet  Take 1 tablet by mouth daily. Take one tablet BID x 7D.   10 tablet   0   .  Ascorbic Acid (VITAMIN C PO)  Take 1,000 mg by mouth daily.          Marland Kitchen  aspirin EC 81 MG tablet  Take 81 mg by mouth daily.         Marland Kitchen  Biotin 5000 MCG CAPS  Take 1 capsule by mouth daily.         .  Calcium Carbonate-Vit D-Min (CALCIUM 1200 PO)  Take 1,200 mg by mouth daily.          .  carvedilol (COREG) 6.25 MG tablet  Take 6.25 mg by mouth 2 (two) times daily with a meal.         .  diazepam (VALIUM) 5 MG tablet  Take 5 mg by mouth every 8 (eight) hours as needed for muscle spasms (for back spasms after surgery).         .  Flaxseed, Linseed, (FLAX SEED OIL PO)  Take 2,000 mg by mouth daily.          .  furosemide (LASIX) 40 MG tablet  Take 40 mg by mouth daily.          Marland Kitchen  gabapentin (NEURONTIN) 600 MG tablet  Take 600 mg by mouth 3 (three) times daily.          Marland Kitchen  levothyroxine (SYNTHROID, LEVOTHROID) 75 MCG tablet  Take 75 mcg by mouth daily.          .  methadone (DOLOPHINE) 5 MG tablet  Take 5 mg by mouth 4 (four) times daily.         .  Multiple Vitamin (MULTIVITAMIN WITH MINERALS) TABS  Take 1 tablet by mouth daily.         .  Multiple Vitamins-Minerals (PRESERVISION/LUTEIN PO)  Take 1 tablet by mouth 2 (two) times daily.         Marland Kitchen  nystatin-triamcinolone ointment (MYCOLOG)  Apply 1  application topically 2 (two) times daily.   30 g   0   .  pravastatin (PRAVACHOL) 20 MG tablet  Take 20 mg by mouth at bedtime.          .  senna (SENOKOT) 8.6 MG tablet  Take 2-3 tablets by mouth 2 (two) times daily. 2 tablets in the morning, 3 tablets in the evening         .  ampicillin (PRINCIPEN) 500 MG capsule  Take 500 mg by mouth daily.         Marland Kitchen  EPINEPHrine (EPI-PEN) 0.3 mg/0.3 mL DEVI  Inject 0.3 mg into the muscle once. For various allergic reactions.         Marland Kitchen  HYDROmorphone (DILAUDID) 2 MG tablet  Take 1 tablet (2 mg total) by mouth every 4 (four) hours as needed.   30 tablet   0      No current facility-administered medications for this visit.      ALLERGIES: Flexeril; Red dye; Ciprofloxacin; Other; Tylenol; Eggs or egg-derived products; Latex; Lisinopril; Methocarbamol; Morphine and related; and Sulfa antibiotics    Family History   Problem  Relation  Age of Onset   .  Hyperlipidemia  Father     .  Hypertension  Father     .  Heart attack  Father  30       deceased       History      Social History   .  Marital Status:  Married       Spouse Name:  N/A       Number of Children:  N/A   .  Years of Education:  N/A      Occupational History   .  Not on file.      Social History Main Topics   .  Smoking status:  Never Smoker    .  Smokeless tobacco:  Never Used         Comment: occ wine   .  Alcohol Use:  0.5 oz/week       1 drink(s) per week         Comment: glass of wine    .  Drug Use:  No   .  Sexual Activity:  Yes       Partners:  Male       Birth Control/ Protection:  Post-menopausal      Other Topics  Concern   .  Not on file      Social History Narrative   .  No narrative on file     ROS:  Pertinent items are noted in HPI.  PHYSICAL EXAMINATION:    BP 140/70  Pulse 70  Ht 5' 3.25" (1.607 m)  Wt 191 lb 6.4 oz (86.818 kg)  BMI 33.62 kg/m2  LMP 12/06/2000     General appearance: alert, cooperative and appears stated age Lungs: clear to  auscultation bilaterally Heart: regular rate and rhythm Abdomen: umbilical and right lower quadrant incisions, soft, non-tender; no masses,  no organomegaly No abnormal inguinal nodes palpated  Pelvic: External genitalia:  no lesions              Urethra:  normal appearing urethra with no masses, tenderness or lesions              Bartholins and Skenes: normal                  Vagina: normal appearing vagina with normal color and discharge, no lesions.  Third degree cystocele and second uterine prolapse.  First degree rectocele.              Cervix: normal appearance                    Bimanual Exam:  Uterus:  uterus is normal size, shape, consistency and nontender                                      Adnexa: normal adnexa in size, nontender and no masses                                      Rectovaginal:  Yes.                                                                Confirms                                      Anus:  normal sphincter tone, no lesions  ASSESSMENT  Complete uterovaginal prolapse.   Genuine stress incontinence.   Status post Proteus UTI with negative test of cure.   Yeast vulvitis.   Multiple back surgeries.  Pain control with methadone.   PLAN  Proceed with surgery.   Dr. Sabra Heck will be performing her total abdominal hysterectomy with bilateral salpingo-oophorectomy.  I will be performing an abdominosacrocolpopexy with permanent mesh, an anterior and posterior colporrhaphy, and TVT Exact with midurethral sling and cystoscopy.  Risks, benefits, and alternatives have been discussed with the patient who wishes to proceed.   Patient will take her Synthroid and Coreg on the day of surgery with a sip of water.   Continue Augmentin daily.   Mycolog II cream externally.    An After Visit Summary was printed and given to the patient.  _25_____ minutes face to face time of which over 50% was spent in counseling.

## 2014-10-07 NOTE — Op Note (Signed)
10/07/2014  3:25 PM  PATIENT:  [name] [name]  73 y.o. female  PRE-OPERATIVE DIAGNOSIS:  uterovaginal prolapse  POST-OPERATIVE DIAGNOSIS:  uterovaginal prolapse  PROCEDURE:  Procedure(s): TOTAL ABDOMINAL HYSTERECTOMY WITH BILATERAL SALPINGO OOPHORECTOMY (Dr. Sabra Heck primary surgeon)  HALBANS CULDOPLAST ANTERIOR (CYSTOCELE) AND POSTERIOR REPAIR ([place]) TRANSVAGINAL TAPE (TVT) EXACT PROCEDURE CYSTOSCOPY (Dr. Quincy Simmonds primary surgeon)  SURGEON:  Codee Tutson SUZANNE  ASSISTANTS: SILVA, BROOK   ANESTHESIA:   spinal  ESTIMATED BLOOD LOSS: 250cc for TAH/BSO, 300cc total  BLOOD ADMINISTERED:none   FLUIDS: 2200cc LR  UOP: 1000ccLR  SPECIMEN:  Uterus, cervix, bilateral tubes and ovaries  DISPOSITION OF SPECIMEN:  PATHOLOGY  FINDINGS: Complete uterovaginal prolapse, adhesions of omentum and small bowel to anterior abdominal wall where prior appendectomy had been done  DESCRIPTION OF OPERATION: Patient is taken to the operating room. She is placed in the supine position. She is a running IV in place. Informed consent was present on the chart. SCDs on her lower extremities and functioning properly. Continuous spinal anesthesia placed without difficulty for post operative pain control.  Then general endotracheal anesthesia was administered by the anesthesia staff without difficulty. Once adequate anesthesia was confirmed the legs are placed in the low lithotomy position in [place].   Betadine was used to prep the inner thighs, perineum and vagina and then Dura prep was then used to prep the abdomen.  Once 3 minutes had past the patient was draped in a normal standard fashion. A foley catheter was placed to straight drain and kept sterile on the field.   A Pfannenstiel skin incision was made and carried down through the subcutaneous fat and tissue.  Cautery was needed to control bleeding from subcutaneous vessels.  The fascia was identified and nicked in the midline. The fascial incision  was extended on each side with curved Mayo scissors. The rectus muscles were dissected off the fascia superiorly and inferiorly down to the level of the pubic symphysis. The peritoneum was identified and opened sharply. The peritoneal incision was extended superiorly and inferiorly down to the level of the bladder.   There were omental adhesions and small bowel adhesions to the anterior abdominal wall where a prior open appendectomy had been performed.  These were taken off with sharp dissection.  An Fredia Sorrow retractor was used.  Bowel was packed anteriorly using moistened laparotomy sponges.  This was difficult due to the redundancy of her bowel.  Upper blade was used on the retractor to help with visualization due to the difficulty with adequate bowel packing.   Ureters were identified by palpation.  Kelly clamps were placed across the utero-ovarian pedicles.  Attention was turned to the right side.  With uterus on stretch the right round ligament was suture-ligated and incised. The anterior and posterior leaf of the broad ligament was opened.  The bladder flap was created using sharp and blunt dissection.  The IP ligament on the right side was isolated. Using a Heaney clamp this ligament was doubly clamped. It was then incised and suture tied first with a free-tie of #0 and then with a stich #0 Vicryl.  Once the bladder was below the level of the uterine artery the right uterine artery was skeletonized. It was then clamped with a Heaney clamp.  The pedicle was incised and then suture-ligated with #0 Vicryl.   Attention was turned to the left side. The left round ligament was suture-ligated and opened. The anterior and posterior leaf of the broad ligament was opened.  The  bladder flap dissection was carried across to the midline to the dissection done on the opposite side. The IP ligament was isolated and clamped with heavney clamp.  Then the IP ligament on the left side was clamped cut and doubly  suture-ligated with a free-tie of #0 Vicryl and a Heaney stitch of #0 Vicryl. The uterine artery skeletonized and doubly clamped. The pedicle was incised and doubly suture-ligated with #0 Vicryl.   The bladder flap dissection was continued as the cervix was very long.  The cardinal ligament were serially clamped cut and suture-ligated with #0 Vicryl using straight Heaney clamps including close to the cervix. At the base of the cervix a curved Heaney clamp was placed on each side. These pedicles were cut and suture-ligated with #0 Vicryl. Care was taken to ensure the corners were adequately incorporated into the cuff closure. The the vagina was closed with additional figure-of-eight sutures of #0 Vicryl. The pelvis was irrigated. All pedicles were hemostatic.    Dr. Quincy Simmonds was present and proceeded with her part of the procedure.  This is dictated separately.  Once the abdominal portion was complete, the pelvis was irrigated one final time.  No bleeding was noted.  All instruments were removed.  The bowel packing was removed.   The retractor was removed.  Then the peritoneum was closed with a #0 Vicryl.  . The fascia was then closed from the corners to the midline with a running stitch of single stranded PDS.  The corners were locked.   The subcutaneous fat tissues reapproximated with interrupted sutures of plain gut. Finally the skin was closed with subcuticular stitch of 3-0 Vicryl.  The incision was cleansed and dermabond was applied.  Once dried, a sterile towel was placed over this for Dr. Quincy Simmonds to continue the vaginal portion of the procedure.    For the dictated portion above, the patient tolerated procedure well.  Sponge, lap, needle, initially counts were correct x2. The remainder of the procedure is dictated by Dr. Quincy Simmonds.  COUNTS:  YES  PLAN OF CARE: Transfer to PACU

## 2014-10-07 NOTE — Brief Op Note (Signed)
10/07/2014  5:06 PM  PATIENT:  Pamelyn H Exline  73 y.o. female  PRE-OPERATIVE DIAGNOSIS:  Incomplete uterovaginal prolapse  POST-OPERATIVE DIAGNOSIS:  Incomplete uterovaginal prolapse  PROCEDURE:  Procedure(s) with comments: TOTAL ABDOMINAL HYSTERECTOMY WITH BILATERAL SALPINGO OOPHORECTOMY AND HALBANS CULDOELASTY  (Bilateral) - Miller primary/Silva assist  4 hours total ANTERIOR (CYSTOCELE) AND POSTERIOR REPAIR (RECTOCELE) (N/A) TRANSVAGINAL TAPE (TVT) EXACT PROCEDURE (N/A) CYSTOSCOPY (N/A)  SURGEON:  Surgeon(s) and Role:    * Lyman Speller, MD - Primary for total abdominal hysterectomy with bilateral salpingo-oophorectomy    * Illyanna Petillo E Amundson de Berton Lan, MD - Primary for Hallban's culdoplasty, anterior and posterior colporrhaphy, TVT Exact midurethral sling and cystoscopy.  PHYSICIAN ASSISTANT: None.  ASSISTANTS:  Dr. Quincy Simmonds and Dr. Sabra Heck.   ANESTHESIA:   local, spinal and general  EBL:  Total I/O In: 2300 [I.V.:2300] Out: 1300 [Urine:1000; Blood:300]  BLOOD ADMINISTERED:none  DRAINS: Urinary Catheter (Foley)   LOCAL MEDICATIONS USED:  LIDOCAINE   SPECIMEN:  Source of Specimen:   Uterus, cervix, bilateral tubes and ovaries.   DISPOSITION OF SPECIMEN:  PATHOLOGY  COUNTS:  YES  TOURNIQUET:  * No tourniquets in log *  DICTATION: .Other Dictation: Dictation Number    PLAN OF CARE: Admit to inpatient   PATIENT DISPOSITION:  PACU - hemodynamically stable.   Delay start of Pharmacological VTE agent (>24hrs) due to surgical blood loss or risk of bleeding: not applicable

## 2014-10-07 NOTE — H&P (Signed)
[name] [name] is an 73 y.o. female  G9 P2 MWF here for TAH/BSO, sacrocolpopexy with anterior and posterior repair (with Dr. Quincy Simmonds) due to complete uterine prolapse.  Urodyanamics have been performed in office.  Combined procedure is planned.  Pt has had recent issues with UTIs but has had a negative cultrue within the past three weeks.  Risks, benefits, and alternatives have been discussed with the patient. She is here with her spouse and daughter and ready to proceed.  All questions answered.  Pertinent Gynecological History: Menses: post-menopausal Bleeding: none Contraception: PMP state DES exposure: denies Blood transfusions: none Sexually transmitted diseases: no past history Previous GYN Procedures: none  Last mammogram: normal Date: 9/15  Last pap: normal Date: 7/14 OB History: G9, P2   Menstrual History: Patient's last menstrual period was 12/06/2000.    Past Medical History  Diagnosis Date  . Hypertension   . Hypothyroidism   . GERD (gastroesophageal reflux disease)   . Chronic pain     lower back  . Bronchitis     history only  . Ulcer     hx  . Virus not detected 2010    postop, caught virus in hosp., respiratory symptoms, upon d/c to home, had allergic reaction /w plate size hives - not sure of the cause    . Sinus infection     recent sinus infection, finished Zpak- 08/16/2013  . Female bladder prolapse   . Anemia     took iron for a while  . SVD (spontaneous vaginal delivery)     x 2  . Missed ab     x 6 - 2 surgery, 4 resolved on it's own  . Seasonal allergies   . Arthritis     lower back  . 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 PONV (postoperative nausea and vomiting)     states she had scop patch in past,states she sets off alarms when she is waking up, she "forgets to breathe"     Past Surgical History  Procedure Laterality Date  . Hammer toe surgery  4/12    lt foot-2-3  . Appendectomy    . Metatarsal osteotomy   3/12    lt  . Thumb fusion  2008    rt  . Carpal tunnel release      both  . Knee arthroscopy  2007    both  . Orif toe fracture  11/04/2011    Procedure: OPEN REDUCTION INTERNAL FIXATION (ORIF) METATARSAL (TOE) FRACTURE;  Surgeon: Wylene Simmer, MD;  Location: [facility];  Service: Orthopedics;  Laterality: Left;  left revision orif 1st metatarsal with autograft from left calcaneous and left 2nd-3rd metatarsal weil osteotomy  . 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 Dilation and curettage of uterus      x2  . Total knee arthroplasty  08/29/2012    Procedure: TOTAL KNEE ARTHROPLASTY;  Surgeon: Hessie Dibble, MD;  Location: [facility];  Service: Orthopedics;  Laterality: Right;  . Other surgical history  05/08/13    hardware removed from left foot  . Bunionectomy  03/02/11 and 11/04/11    left foot x2  . Joint replacement      R- TKA- 2013  . Tubal ligation    . Total shoulder arthroplasty Right 05/09/2014    Procedure: TOTAL SHOULDER ARTHROPLASTY;  Surgeon: Nita Sells, MD;  Location: [facility];  Service: Orthopedics;  Laterality:  Right;  Right shoulder arthroplasty  . Lumbar laminectomy  05/1999    L4-L5  . Back surgery  09,00,10,99, 6/14,9/14    x6 surgeries on back -Spinal Fusin L4-L5, Lumbar Fusion L1-S1  . Breast surgery      left cyst- benign    Family History  Problem Relation Age of Onset  . Hyperlipidemia Father   . Hypertension Father   . Heart attack Father 97    deceased    Social History:  reports that she has never smoked. She has never used smokeless tobacco. She reports that she drinks about 0.5 oz of alcohol per week. She reports that she does not use illicit drugs.  Allergies:  Allergies  Allergen Reactions  . Flexeril [Cyclobenzaprine] Hives  . Red Dye Hives and Swelling  . Ciprofloxacin Hives  . Other Swelling    Lip swelling with artifical crab meat  . Tylenol [Acetaminophen] Other (See Comments)    Makes her feel funny. Cannot take Tylenol  in combination products (Percocet, Vicodin), but can take it alone  . Eggs Or Egg-Derived Products Nausea And Vomiting    Cannot tolerate fried/scrambled eggs.   . Latex Hives and Other (See Comments)    Blisters  . Lisinopril Hives  . Methocarbamol Hives  . Morphine And Related Other (See Comments)    Does not provide any pain relief  . Sulfa Antibiotics Hives    No prescriptions prior to admission    Review of Systems  All other systems reviewed and are negative.   Last menstrual period 12/06/2000. Physical Exam  Constitutional: She appears well-developed and well-nourished.  Cardiovascular: Normal rate and regular rhythm.   Respiratory: Effort normal and breath sounds normal.  Skin: Skin is warm and dry.  Psychiatric: She has a normal mood and affect.    No results found for this or any previous visit (from the past 24 hour(s)).  No results found.  Assessment/Plan: 73 yo G9P2 MWF with complete uterovaginal prolapse here for TAH/BSO, sacrocolpopexy, anterior and posterior repair (with Dr. Quincy Simmonds).  All questions here and ready to proceed.  [name] [name] 10/07/2014, 7:25 AM

## 2014-10-07 NOTE — Anesthesia Procedure Notes (Signed)
Epidural Patient location during procedure: OR Start time: 10/07/2014 9:55 AM  Staffing Anesthesiologist: CASSIDY, AMY Performed by: anesthesiologist   Preanesthetic Checklist Completed: patient identified, site marked, surgical consent, pre-op evaluation, timeout performed, IV checked, risks and benefits discussed and monitors and equipment checked  Epidural Patient position: sitting Prep: site prepped and draped and DuraPrep Patient monitoring: heart rate, continuous pulse ox and blood pressure Approach: midline Location: L3-L4 Injection technique: LOR air  Needle:  Needle type: Tuohy  Needle gauge: 17 G Needle length: 9 cm Needle insertion depth: 6.5 cm Catheter type: closed end flexible Catheter size: 19 Gauge Catheter at skin depth: 11.5 cm Test dose: positive and 2% lidocaine with Epi 1:200 K  Additional Notes Epidural placed for post-op pain management.  History of 6 prior back surgeries from L1-S1.  LOR to air at 6.5 cm, no CSF from [place].  Test dose of 3 ml of 2% lidocaine with 1:200K epi given via tuohy with no intravascular or intrathecal symptoms.  Epidural catheter threaded easily.  Catheter aspirated with +CSF.  Test dose given 3 ml of 2% lidocaine with 1:200K epi given via epidural catheter with no intravascular symptoms, but + numbness of lower extremities/bottom.  Catheter marked as INTRATHECAL.  Patient tolerated procedure well.  Charlton Haws, MDReason for block:procedure for pain

## 2014-10-07 NOTE — Progress Notes (Addendum)
Day of Surgery Procedure(s) (LRB): TOTAL ABDOMINAL HYSTERECTOMY WITH BILATERAL SALPINGO OOPHORECTOMY AND HALBANS CULDOPLASTY  (Bilateral) ANTERIOR (CYSTOCELE) AND POSTERIOR REPAIR (RECTOCELE) (N/A) TRANSVAGINAL TAPE (TVT) EXACT PROCEDURE (N/A) CYSTOSCOPY (N/A)  Subjective: Patient groggy.  No complaints.  Mild pain.  Denies nausea.  Family at bedside.  D/W pt's family surgery today and not doing sacrocolpopexy.  All questions answered.  Objective: I have reviewed patient's vital signs, intake and output, medications and labs. Tm: 99.3 R 11-27  P:75-94  BP: 95-130/47-59 UOP: 1275  General: cooperative, no distress and drowsy Resp: clear to auscultation bilaterally Cardio: regular rate and rhythm, S1, S2 normal, no murmur, click, rub or gallop GI: normal findings: soft, non-tender and quite with minimal BS and incision: clean, dry and intact Extremities: extremities normal, atraumatic, no cyanosis or edema Vaginal Bleeding: none and packing in place  Assessment: s/p Procedure(s) with comments: TOTAL ABDOMINAL HYSTERECTOMY WITH BILATERAL SALPINGO OOPHORECTOMY AND HALBANS CULDOELASTY  (Bilateral) - George Haggart primary/Silva assist  4 hours total ANTERIOR (CYSTOCELE) AND POSTERIOR REPAIR (RECTOCELE) (N/A) TRANSVAGINAL TAPE (TVT) EXACT PROCEDURE (N/A) CYSTOSCOPY (N/A): stable  Plan: IN ICU overnight due to spinal anesthesia and depressed respirations  Foley to SD Repeat abx dosage TED hose/PAS for DVT prophylaxis Incentive spirometry Repeat CBC, CMP in AM  LOS: 0 days    [name] 10/07/2014, 9:07 PM

## 2014-10-07 NOTE — Anesthesia Preprocedure Evaluation (Addendum)
Anesthesia Evaluation  Patient identified by MRN, date of birth, ID band Patient awake    Reviewed: Allergy & Precautions, H&P , NPO status , Patient's Chart, lab work & pertinent test results, reviewed documented beta blocker date and time   History of Anesthesia Complications (+) PONV and history of anesthetic complications  Airway Mallampati: II  TM Distance: >3 FB Neck ROM: full    Dental  (+) Teeth Intact   Pulmonary neg pulmonary ROS,  breath sounds clear to auscultation  Pulmonary exam normal       Cardiovascular Exercise Tolerance: Good hypertension, On Home Beta Blockers Rhythm:regular Rate:Normal     Neuro/Psych Chronic low back pain - takes gabapentin and methadone  Neuromuscular disease (left foot numbness, uses cane) negative psych ROS   GI/Hepatic Neg liver ROS, PUD (from ibuprofen), GERD- (only with ibuprofen)  ,  Endo/Other  Hypothyroidism BMI 33.7  Renal/GU negative Renal ROS Bladder dysfunction Female GU complaint     Musculoskeletal  (+) Arthritis - (lower back s/p back surgery x6, s/p right total shoulder replacement, s/p right knee replacement),   Abdominal   Peds  Hematology   Anesthesia Other Findings Tylenol allergy - can take plain tylenol, but doesn't take it in combination products  Morphine allergy - morphine doesn't work for her  Egg allergy - eating straight eggs causes nausea, eating eggs cooked with other things (ie in cake or bread) is OK  Flexeril, red dye, cipro, latex, lisinopril, methcarbamol, sulfa - hives   Reproductive/Obstetrics negative OB ROS                          Anesthesia Physical Anesthesia Plan  ASA: III  Anesthesia Plan: General ETT and Epidural   Post-op Pain Management:    Induction:   Airway Management Planned:   Additional Equipment:   Intra-op Plan:   Post-operative Plan: Extubation in OR  Informed Consent: I have  reviewed the patients History and Physical, chart, labs and discussed the procedure including the risks, benefits and alternatives for the proposed anesthesia with the patient or authorized representative who has indicated his/her understanding and acceptance.   Dental Advisory Given  Plan Discussed with: CRNA and Surgeon  Anesthesia Plan Comments:        Anesthesia Quick Evaluation

## 2014-10-07 NOTE — Progress Notes (Signed)
Update to History and Physical  No marked change in status since office pre-op visit.   Patient examined.   OK to proceed with surgery. 

## 2014-10-07 NOTE — Progress Notes (Signed)
Day of Surgery Procedure(s) (LRB): TOTAL ABDOMINAL HYSTERECTOMY WITH BILATERAL SALPINGO OOPHORECTOMY AND HALBANS CULDOELASTY  (Bilateral) ANTERIOR (CYSTOCELE) AND POSTERIOR REPAIR (RECTOCELE) (N/A) TRANSVAGINAL TAPE (TVT) EXACT PROCEDURE (N/A) CYSTOSCOPY (N/A)  Subjective: Patient reports dry mouth.  Good pain control.  Denies nausea.  Still in PACU for monitoring due to low blood pressure.  Had a spinal - continuous indwelling placed and this has been shut off due to hypotension.   Objective: I have reviewed patient's vital signs and intake and output. T 99, BP 86/51, P 77, RR 13. I/O - 2300 cc IV/1475 cc clear yellow urine.   General: alert and cooperative Resp: diminished breath sounds LLL and RLL Cardio: regular rate and rhythm, S1, S2 normal, no murmur, click, rub or gallop GI: incision: clean, dry and intact and  Absent bowel sounds, obese, soft, nontender. Extremities:  PAS and Ted hose on. DPs 2+ bilaterally.  Vaginal Bleeding: minimal  Assessment: s/p Procedure(s) with comments: TOTAL ABDOMINAL HYSTERECTOMY WITH BILATERAL SALPINGO OOPHORECTOMY AND HALBANS CULDOELASTY  (Bilateral) - Miller primary/Silva assist  4 hours total ANTERIOR (CYSTOCELE) AND POSTERIOR REPAIR (RECTOCELE) (N/A) TRANSVAGINAL TAPE (TVT) EXACT PROCEDURE (N/A) CYSTOSCOPY (N/A):  Post op hypotension.  Has indwelling spinal.   Plan:  Anesthesia will be in to assess spinal and hypotension.   Will do STAT CBC and BMP now.  Continue foley.  Ted hose and PAS are on.  Incentive spirometry.  I discussed surgery and findings with the patient. Questions answered.   LOS: 0 days    Arloa Koh 10/07/2014, 6:43 PM

## 2014-10-07 NOTE — Transfer of Care (Signed)
Immediate Anesthesia Transfer of Care Note  Patient: [name] [name]  Procedure(s) Performed: Procedure(s) with comments: TOTAL ABDOMINAL HYSTERECTOMY WITH BILATERAL SALPINGO OOPHORECTOMY AND HALBANS CULDOELASTY  (Bilateral) - Miller primary/Silva assist  4 hours total ANTERIOR (CYSTOCELE) AND POSTERIOR REPAIR (RECTOCELE) (N/A) TRANSVAGINAL TAPE (TVT) EXACT PROCEDURE (N/A) CYSTOSCOPY (N/A)  Patient Location: PACU  Anesthesia Type:General and Spinal  Level of Consciousness: awake, alert  and oriented  Airway & Oxygen Therapy: Patient Spontanous Breathing and Patient connected to nasal cannula oxygen  Post-op Assessment: Report given to PACU RN and Post -op Vital signs reviewed and stable  Post vital signs: Reviewed and stable  Complications: No apparent anesthesia complications

## 2014-10-07 NOTE — Anesthesia Postprocedure Evaluation (Signed)
  Anesthesia Post-op Note  Patient: [name] [name]  Procedure(s) Performed: Procedure(s) with comments: TOTAL ABDOMINAL HYSTERECTOMY WITH BILATERAL SALPINGO OOPHORECTOMY AND HALBANS CULDOELASTY  (Bilateral) - Miller primary/Silva assist  4 hours total ANTERIOR (CYSTOCELE) AND POSTERIOR REPAIR (RECTOCELE) (N/A) TRANSVAGINAL TAPE (TVT) EXACT PROCEDURE (N/A) CYSTOSCOPY (N/A)  Patient is easily awake, responsive, moving her legs, and has numbness till T10. Pain and nausea are  well controlled. Vital signs are stable and clinically acceptable. Oxygen saturation is clinically acceptable, she has episodic hypopnea and thus we'll send her to ICU. There are no apparent anesthetic complications at this time. Patient is ready for discharge, with 1cc/hr of Bupiv/fentanyl infusion.

## 2014-10-08 ENCOUNTER — Encounter (HOSPITAL_COMMUNITY): Payer: Self-pay | Admitting: Obstetrics & Gynecology

## 2014-10-08 DIAGNOSIS — K219 Gastro-esophageal reflux disease without esophagitis: Secondary | ICD-10-CM | POA: Diagnosis present

## 2014-10-08 DIAGNOSIS — N393 Stress incontinence (female) (male): Secondary | ICD-10-CM | POA: Diagnosis present

## 2014-10-08 DIAGNOSIS — R339 Retention of urine, unspecified: Secondary | ICD-10-CM | POA: Diagnosis not present

## 2014-10-08 DIAGNOSIS — Z79899 Other long term (current) drug therapy: Secondary | ICD-10-CM | POA: Diagnosis not present

## 2014-10-08 DIAGNOSIS — Z882 Allergy status to sulfonamides status: Secondary | ICD-10-CM | POA: Diagnosis not present

## 2014-10-08 DIAGNOSIS — E039 Hypothyroidism, unspecified: Secondary | ICD-10-CM | POA: Diagnosis present

## 2014-10-08 DIAGNOSIS — L03311 Cellulitis of abdominal wall: Secondary | ICD-10-CM | POA: Diagnosis not present

## 2014-10-08 DIAGNOSIS — M4697 Unspecified inflammatory spondylopathy, lumbosacral region: Secondary | ICD-10-CM | POA: Diagnosis present

## 2014-10-08 DIAGNOSIS — Z9889 Other specified postprocedural states: Secondary | ICD-10-CM | POA: Diagnosis not present

## 2014-10-08 DIAGNOSIS — I9581 Postprocedural hypotension: Secondary | ICD-10-CM | POA: Diagnosis not present

## 2014-10-08 DIAGNOSIS — R110 Nausea: Secondary | ICD-10-CM | POA: Diagnosis not present

## 2014-10-08 DIAGNOSIS — N8502 Endometrial intraepithelial neoplasia [EIN]: Secondary | ICD-10-CM | POA: Diagnosis present

## 2014-10-08 DIAGNOSIS — Z9102 Food additives allergy status: Secondary | ICD-10-CM | POA: Diagnosis not present

## 2014-10-08 DIAGNOSIS — Z91012 Allergy to eggs: Secondary | ICD-10-CM | POA: Diagnosis not present

## 2014-10-08 DIAGNOSIS — R1030 Lower abdominal pain, unspecified: Secondary | ICD-10-CM | POA: Diagnosis not present

## 2014-10-08 DIAGNOSIS — Z888 Allergy status to other drugs, medicaments and biological substances status: Secondary | ICD-10-CM | POA: Diagnosis not present

## 2014-10-08 DIAGNOSIS — Y838 Other surgical procedures as the cause of abnormal reaction of the patient, or of later complication, without mention of misadventure at the time of the procedure: Secondary | ICD-10-CM | POA: Diagnosis not present

## 2014-10-08 DIAGNOSIS — M19019 Primary osteoarthritis, unspecified shoulder: Secondary | ICD-10-CM | POA: Diagnosis present

## 2014-10-08 DIAGNOSIS — Z885 Allergy status to narcotic agent status: Secondary | ICD-10-CM | POA: Diagnosis not present

## 2014-10-08 DIAGNOSIS — I10 Essential (primary) hypertension: Secondary | ICD-10-CM | POA: Diagnosis present

## 2014-10-08 DIAGNOSIS — N813 Complete uterovaginal prolapse: Secondary | ICD-10-CM | POA: Diagnosis present

## 2014-10-08 DIAGNOSIS — T814XXA Infection following a procedure, initial encounter: Secondary | ICD-10-CM | POA: Diagnosis not present

## 2014-10-08 DIAGNOSIS — Z90710 Acquired absence of both cervix and uterus: Secondary | ICD-10-CM | POA: Diagnosis present

## 2014-10-08 DIAGNOSIS — R682 Dry mouth, unspecified: Secondary | ICD-10-CM | POA: Diagnosis not present

## 2014-10-08 DIAGNOSIS — G8929 Other chronic pain: Secondary | ICD-10-CM | POA: Diagnosis present

## 2014-10-08 DIAGNOSIS — Z881 Allergy status to other antibiotic agents status: Secondary | ICD-10-CM | POA: Diagnosis not present

## 2014-10-08 DIAGNOSIS — Z96651 Presence of right artificial knee joint: Secondary | ICD-10-CM | POA: Diagnosis present

## 2014-10-08 DIAGNOSIS — G709 Myoneural disorder, unspecified: Secondary | ICD-10-CM | POA: Diagnosis present

## 2014-10-08 DIAGNOSIS — Z91040 Latex allergy status: Secondary | ICD-10-CM | POA: Diagnosis not present

## 2014-10-08 DIAGNOSIS — M1711 Unilateral primary osteoarthritis, right knee: Secondary | ICD-10-CM | POA: Diagnosis present

## 2014-10-08 DIAGNOSIS — R42 Dizziness and giddiness: Secondary | ICD-10-CM | POA: Diagnosis not present

## 2014-10-08 DIAGNOSIS — M549 Dorsalgia, unspecified: Secondary | ICD-10-CM | POA: Diagnosis present

## 2014-10-08 DIAGNOSIS — D62 Acute posthemorrhagic anemia: Secondary | ICD-10-CM | POA: Diagnosis not present

## 2014-10-08 DIAGNOSIS — J302 Other seasonal allergic rhinitis: Secondary | ICD-10-CM | POA: Diagnosis present

## 2014-10-08 DIAGNOSIS — Z8249 Family history of ischemic heart disease and other diseases of the circulatory system: Secondary | ICD-10-CM | POA: Diagnosis not present

## 2014-10-08 DIAGNOSIS — Z7982 Long term (current) use of aspirin: Secondary | ICD-10-CM | POA: Diagnosis not present

## 2014-10-08 LAB — COMPREHENSIVE METABOLIC PANEL
ALT: 15 U/L (ref 0–35)
ANION GAP: 12 (ref 5–15)
AST: 19 U/L (ref 0–37)
Albumin: 2.7 g/dL — ABNORMAL LOW (ref 3.5–5.2)
Alkaline Phosphatase: 70 U/L (ref 39–117)
BILIRUBIN TOTAL: 0.4 mg/dL (ref 0.3–1.2)
BUN: 16 mg/dL (ref 6–23)
CHLORIDE: 103 meq/L (ref 96–112)
CO2: 24 meq/L (ref 19–32)
Calcium: 7.7 mg/dL — ABNORMAL LOW (ref 8.4–10.5)
Creatinine, Ser: 0.92 mg/dL (ref 0.50–1.10)
GFR calc Af Amer: 70 mL/min — ABNORMAL LOW (ref >90)
GFR, EST NON AFRICAN AMERICAN: 61 mL/min — AB (ref >90)
GLUCOSE: 169 mg/dL — AB (ref 70–99)
Potassium: 4.1 mEq/L (ref 3.7–5.3)
Sodium: 139 mEq/L (ref 137–147)
Total Protein: 5.7 g/dL — ABNORMAL LOW (ref 6.0–8.3)

## 2014-10-08 LAB — CBC
HCT: 28.8 % — ABNORMAL LOW (ref 36.0–46.0)
HEMOGLOBIN: 9.6 g/dL — AB (ref 12.0–15.0)
MCH: 30.4 pg (ref 26.0–34.0)
MCHC: 33.3 g/dL (ref 30.0–36.0)
MCV: 91.1 fL (ref 78.0–100.0)
Platelets: 174 10*3/uL (ref 150–400)
RBC: 3.16 MIL/uL — AB (ref 3.87–5.11)
RDW: 15.9 % — ABNORMAL HIGH (ref 11.5–15.5)
WBC: 7.8 10*3/uL (ref 4.0–10.5)

## 2014-10-08 MED ORDER — OXYCODONE HCL 5 MG PO TABS
10.0000 mg | ORAL_TABLET | ORAL | Status: DC | PRN
  Administered 2014-10-08 – 2014-10-09 (×2): 10 mg via ORAL
  Filled 2014-10-08 (×2): qty 2

## 2014-10-08 MED ORDER — MORPHINE SULFATE 15 MG PO TABS: 15.0000 mg | ORAL_TABLET | ORAL | Status: DC | PRN

## 2014-10-08 MED ORDER — OXYCODONE HCL 5 MG PO TABS
5.0000 mg | ORAL_TABLET | ORAL | Status: DC | PRN
  Administered 2014-10-08 – 2014-10-09 (×4): 5 mg via ORAL
  Filled 2014-10-08 (×4): qty 1

## 2014-10-08 MED ORDER — HYDROMORPHONE HCL 2 MG PO TABS
2.0000 mg | ORAL_TABLET | ORAL | Status: DC | PRN
Start: 2014-10-08 — End: 2014-10-08
  Administered 2014-10-08 (×2): 2 mg via ORAL
  Filled 2014-10-08 (×2): qty 1

## 2014-10-08 MED ORDER — METHADONE HCL 10 MG PO TABS
5.0000 mg | ORAL_TABLET | Freq: Four times a day (QID) | ORAL | Status: DC
  Administered 2014-10-08 – 2014-10-10 (×10): 5 mg via ORAL
  Filled 2014-10-08 (×11): qty 1

## 2014-10-08 MED ORDER — LACTATED RINGERS IV SOLN
INTRAVENOUS | Status: DC
  Administered 2014-10-08 (×2): via INTRAVENOUS

## 2014-10-08 NOTE — Progress Notes (Signed)
1 Day Post-Op Procedure(s) (LRB): TOTAL ABDOMINAL HYSTERECTOMY WITH BILATERAL SALPINGO OOPHORECTOMY AND HALBANS CULDOELASTY  (Bilateral) ANTERIOR (CYSTOCELE) AND POSTERIOR REPAIR (RECTOCELE) (N/A) TRANSVAGINAL TAPE (TVT) EXACT PROCEDURE (N/A) CYSTOSCOPY (N/A)  Subjective: Patient having some nausea.  No emesis.  Reports flatus one or two times.  Has eaten a little more.  Had some fruit and ice cream this evening.  Sat in chair for four hours and walked from ICU to her current room (down the hall).   Objective: I have reviewed patient's vital signs, intake and output, medications and there have been no new labs.  Tmax 99.3,  Pox: 05% on 3L [place]  UOP: 1700cc  General: alert and cooperative Resp: clear to auscultation bilaterally Cardio: regular rate and rhythm, S1, S2 normal, no murmur, click, rub or gallop GI: soft, non-tender; bowel sounds normal; no masses,  no organomegaly and incision: clean, dry and intact Extremities: extremities normal, atraumatic, no cyanosis or edema Vaginal Bleeding: minimal  Assessment: s/p Procedure(s) with comments: TOTAL ABDOMINAL HYSTERECTOMY WITH BILATERAL SALPINGO OOPHORECTOMY AND HALBANS CULDOELASTY  (Bilateral) - Qunicy Higinbotham primary/Silva assist  4 hours total ANTERIOR (CYSTOCELE) AND POSTERIOR REPAIR (RECTOCELE) (N/A) TRANSVAGINAL TAPE (TVT) EXACT PROCEDURE (N/A) CYSTOSCOPY (N/A): stable and progressing well  Plan: Advance diet Encourage ambulation Continue foley due to surgery but will start bladder trials tomorrow am  Repeat CBC and BMP in AM IS encouraged Continue Oxycontin IR and Methadone for pain Back on antihypertensives and synthroid   LOS: 1 day    [name] 10/08/2014, 9:44 PM

## 2014-10-08 NOTE — Plan of Care (Signed)
Problem: Phase II Progression Outcomes Goal: Pain controlled Outcome: Completed/Met Date Met:  10/08/14 Goal: Progress activity as tolerated unless otherwise ordered Outcome: Progressing Goal: Afebrile, VS remain stable Outcome: Completed/Met Date Met:  10/08/14 Goal: Incision/dressings dry and intact Outcome: Completed/Met Date Met:  10/08/14  Problem: Phase III Progression Outcomes Goal: Pain controlled on oral analgesia Outcome: Completed/Met Date Met:  10/08/14 Goal: Suprapubic cath with voiding trials Outcome: Not Applicable Date Met:  42/39/53  Problem: Discharge Progression Outcomes Goal: Tolerating diet Outcome: Completed/Met Date Met:  10/08/14

## 2014-10-08 NOTE — Progress Notes (Signed)
UR chart review completed.  

## 2014-10-08 NOTE — Progress Notes (Signed)
1 Day Post-Op Procedure(s) (LRB):   TOTAL ABDOMINAL HYSTERECTOMY WITH BILATERAL SALPINGO OOPHORECTOMY AND HALBANS CULDOELASTY  (Bilateral) ANTERIOR (CYSTOCELE) AND POSTERIOR REPAIR (RECTOCELE) (N/A) TRANSVAGINAL TAPE (TVT) EXACT PROCEDURE (N/A) CYSTOSCOPY (N/A)  Subjective: Patient reports lower abdominal cramping, back pain, and right shoulder pain.   Objective: I have reviewed patient's vital signs, intake and output and labs. T 98.1, BP 140/54, P 89, RR 17 I/O - 3715 cc/2005 cc Hgb 9.6, WBC 7.8, glucose 169, calcium 7.7  General: alert Resp: clear to auscultation bilaterally Cardio: regular rate and rhythm, S1, S2 normal, no murmur, click, rub or gallop GI: incision: clean, dry and intact Extremities:  PAS and ted hose on. DPs 2+ bilaterally.  Vaginal Bleeding: minimal  Vaginal packing out by Dr.  Sabra Heck Abdomen:  Scant bowel sounds, soft, nontender.   Assessment: s/p Procedure(s) with comments: TOTAL ABDOMINAL HYSTERECTOMY WITH BILATERAL SALPINGO OOPHORECTOMY AND HALBANS CULDOELASTY  (Bilateral) - Miller primary/Silva assist  4 hours total ANTERIOR (CYSTOCELE) AND POSTERIOR REPAIR (RECTOCELE) (N/A) TRANSVAGINAL TAPE (TVT) EXACT PROCEDURE (N/A) CYSTOSCOPY (N/A): stable  Plan: Advance to PO medication Continue foley due to  post operative state. Clear liquids  Spinal infusion will be stopped.  Will do bladder training tomorrow.  Reviewed surgical findings and procedure.  Questions answered.    LOS: 1 day    Arloa Koh 10/08/2014, 1:42 PM  Late entry.  Patient seen at 7:30 this am.

## 2014-10-08 NOTE — Plan of Care (Signed)
Problem: Phase I Progression Outcomes Goal: OOB as tolerated unless otherwise ordered Outcome: Completed/Met Date Met:  10/08/14 Goal: Initial discharge plan identified Outcome: Completed/Met Date Met:  10/08/14 Goal: Hemodynamically stable Outcome: Completed/Met Date Met:  10/08/14

## 2014-10-08 NOTE — Addendum Note (Signed)
Addendum  created 10/08/14 1016 by Flossie Dibble, CRNA   Modules edited: Anesthesia LDA, Lines/Drains/Airways Properties Editor, Notes Section   Lines/Drains/Airways Properties Editor:  Properties of line/drain/airway/wound [REMOVED] Epidural Catheter 10/08/14 have been modified.   Notes Section:  File: [id number]

## 2014-10-08 NOTE — Anesthesia Postprocedure Evaluation (Signed)
Anesthesia Post Note  Patient: [name] [name]  Procedure(s) Performed: Procedure(s) with comments: TOTAL ABDOMINAL HYSTERECTOMY WITH BILATERAL SALPINGO OOPHORECTOMY AND HALBANS CULDOELASTY  (Bilateral) - Miller primary/Silva assist  4 hours total ANTERIOR (CYSTOCELE) AND POSTERIOR REPAIR (RECTOCELE) (N/A) TRANSVAGINAL TAPE (TVT) EXACT PROCEDURE (N/A) CYSTOSCOPY (N/A)  Anesthesia type: Spinal  Patient location: [place] [facility]  Post pain: Pain level controlled  Post assessment: Post-op Vital signs reviewed  Last Vitals: BP 140/54 mmHg  Pulse 89  Temp(Src) 36.7 C (Oral)  Resp 17  Ht 5\' 3"  (1.6 m)  Wt 190 lb (86.183 kg)  BMI 33.67 kg/m2  SpO2 96%  LMP 12/06/2000  Post vital signs: Reviewed  Level of consciousness: awake  Complications: No apparent anesthesia complications

## 2014-10-08 NOTE — Progress Notes (Signed)
1 Day Post-Op Procedure(s) (LRB): TOTAL ABDOMINAL HYSTERECTOMY WITH BILATERAL SALPINGO OOPHORECTOMY AND HALBANS CULDOELASTY  (Bilateral) ANTERIOR (CYSTOCELE) AND POSTERIOR REPAIR (RECTOCELE) (N/A) TRANSVAGINAL TAPE (TVT) EXACT PROCEDURE (N/A) CYSTOSCOPY (N/A)  Subjective: Patient reports cramping pain for the last two hours.  No nausea.  No other complaints.   Objective: I have reviewed patient's vital signs, intake and output, medications and labs. P: 85-94, R; 14-17  BP: 89-131/52-76, Pox:  93-98% on 2L [place] UOP:  2000cc clear Post ob hb: 9.6 CMP:  Glucose 169  General: alert and cooperative Resp: clear to auscultation bilaterally Cardio: regular rate and rhythm, S1, S2 normal, no murmur, click, rub or gallop GI: soft, non-tender; bowel sounds normal; no masses,  no organomegaly Extremities: extremities normal, atraumatic, no cyanosis or edema and and SCDs present Vaginal Bleeding: minimal and and vaginal packing removed  Assessment: s/p Procedure(s) with comments: TOTAL ABDOMINAL HYSTERECTOMY WITH BILATERAL SALPINGO OOPHORECTOMY AND HALBANS CULDOELASTY  (Bilateral) - Davi Kroon primary/Silva assist  4 hours total ANTERIOR (CYSTOCELE) AND POSTERIOR REPAIR (RECTOCELE) (N/A) TRANSVAGINAL TAPE (TVT) EXACT PROCEDURE (N/A) CYSTOSCOPY (N/A): stable and progressing well  Plan: Start clear diet.  Advance as tolerated Change fluids to LR Stop spinal infusion and restart Methadone. Transition to oral pain medication. Restart synthroid and antihypertensives Hopeful ambulation later this morning  Continue Protonix and SCDs   LOS: 1 day    [name] 10/08/2014, 7:31 AM

## 2014-10-08 NOTE — Progress Notes (Signed)
1 Day Post-Op Procedure(s) (LRB): TOTAL ABDOMINAL HYSTERECTOMY WITH BILATERAL SALPINGO OOPHORECTOMY AND HALBANS CULDOELASTY  (Bilateral) ANTERIOR (CYSTOCELE) AND POSTERIOR REPAIR (RECTOCELE) (N/A) TRANSVAGINAL TAPE (TVT) EXACT PROCEDURE (N/A) CYSTOSCOPY (N/A)  Subjective: Patient reports nausea and incisional pain.   Nausea is better now.  No vomiting.  Has had only clear liquids today.  No flatus.  Taking Oxycotin IR for pain control.  States that dilaudid is usually helpful.  Off the indwelling spinal. Has not ambulated today. Feels warm.   Still in AICU now.   Objective: I have reviewed patient's vital signs and intake and output. Temp 99.6 now with nurse.  UO 500 cc.   General: alert Resp: clear to auscultation bilaterally Cardio: regular rate and rhythm, S1, S2 normal, no murmur, click, rub or gallop GI: soft, non-tender; bowel sounds normal; no masses,  no organomegaly and incision: clean, dry, intact and  SP incisions with mild ecchymoses, no induration.  Vaginal Bleeding: moderate  Assessment: s/p Procedure(s) with comments: TOTAL ABDOMINAL HYSTERECTOMY WITH BILATERAL SALPINGO OOPHORECTOMY AND HALBANS CULDOELASTY  (Bilateral) - Miller primary/Silva assist  4 hours total ANTERIOR (CYSTOCELE) AND POSTERIOR REPAIR (RECTOCELE) (N/A) TRANSVAGINAL TAPE (TVT) EXACT PROCEDURE (N/A) CYSTOSCOPY (N/A): stable  Low grade temp.  No ambulation yet.  I suspect atelectasis.   Plan: Advance diet Continue foley due to  post op state.   Will transfer to [facility].  Oxycotin IR and Methadone for pain.  Check CBC with diff and BMP in am.  Encouraged incentive spirometer use.  Bladder training starting in the am.   Nurse will help with ambulation this evening.  Patient uses a walker.   LOS: 1 day    Arloa Koh 10/08/2014, 6:14 PM

## 2014-10-09 LAB — BASIC METABOLIC PANEL
Anion gap: 7 (ref 5–15)
BUN: 9 mg/dL (ref 6–23)
CO2: 29 mEq/L (ref 19–32)
Calcium: 7.6 mg/dL — ABNORMAL LOW (ref 8.4–10.5)
Chloride: 103 mEq/L (ref 96–112)
Creatinine, Ser: 0.74 mg/dL (ref 0.50–1.10)
GFR, EST NON AFRICAN AMERICAN: 83 mL/min — AB (ref >90)
Glucose, Bld: 107 mg/dL — ABNORMAL HIGH (ref 70–99)
POTASSIUM: 3.7 meq/L (ref 3.7–5.3)
SODIUM: 139 meq/L (ref 137–147)

## 2014-10-09 LAB — CBC WITH DIFFERENTIAL/PLATELET
BASOS PCT: 0 % (ref 0–1)
Basophils Absolute: 0 10*3/uL (ref 0.0–0.1)
EOS ABS: 0.2 10*3/uL (ref 0.0–0.7)
Eosinophils Relative: 2 % (ref 0–5)
HCT: 21.5 % — ABNORMAL LOW (ref 36.0–46.0)
Hemoglobin: 7.3 g/dL — ABNORMAL LOW (ref 12.0–15.0)
LYMPHS ABS: 1.9 10*3/uL (ref 0.7–4.0)
Lymphocytes Relative: 24 % (ref 12–46)
MCH: 31.2 pg (ref 26.0–34.0)
MCHC: 34 g/dL (ref 30.0–36.0)
MCV: 91.9 fL (ref 78.0–100.0)
Monocytes Absolute: 1 10*3/uL (ref 0.1–1.0)
Monocytes Relative: 12 % (ref 3–12)
NEUTROS PCT: 62 % (ref 43–77)
Neutro Abs: 5.1 10*3/uL (ref 1.7–7.7)
Platelets: 130 10*3/uL — ABNORMAL LOW (ref 150–400)
RBC: 2.34 MIL/uL — AB (ref 3.87–5.11)
RDW: 15.9 % — ABNORMAL HIGH (ref 11.5–15.5)
WBC: 8.2 10*3/uL (ref 4.0–10.5)

## 2014-10-09 LAB — PREPARE RBC (CROSSMATCH)

## 2014-10-09 LAB — ABO/RH: ABO/RH(D): O POS

## 2014-10-09 LAB — HEMOGLOBIN AND HEMATOCRIT, BLOOD
HCT: 27.6 % — ABNORMAL LOW (ref 36.0–46.0)
Hemoglobin: 9.2 g/dL — ABNORMAL LOW (ref 12.0–15.0)

## 2014-10-09 MED ORDER — HYDROMORPHONE HCL 2 MG PO TABS: 4.0000 mg | ORAL_TABLET | ORAL | Status: DC | PRN

## 2014-10-09 MED ORDER — HYDROMORPHONE HCL 2 MG PO TABS: 2.0000 mg | ORAL_TABLET | ORAL | Status: DC | PRN

## 2014-10-09 MED ORDER — SODIUM CHLORIDE 0.9 % IV SOLN
Freq: Once | INTRAVENOUS | Status: AC
  Administered 2014-10-09: 10:00:00 via INTRAVENOUS

## 2014-10-09 MED ORDER — DIPHENHYDRAMINE HCL 50 MG/ML IJ SOLN
12.5000 mg | Freq: Once | INTRAMUSCULAR | Status: AC
  Administered 2014-10-09: 12.5 mg via INTRAVENOUS
  Filled 2014-10-09: qty 1

## 2014-10-09 MED ORDER — SENNA 8.6 MG PO TABS
2.0000 | ORAL_TABLET | Freq: Two times a day (BID) | ORAL | Status: DC
  Administered 2014-10-09 – 2014-10-10 (×2): 17.2 mg via ORAL
  Filled 2014-10-09 (×5): qty 2

## 2014-10-09 MED ORDER — ACETAMINOPHEN 325 MG PO TABS
650.0000 mg | ORAL_TABLET | Freq: Once | ORAL | Status: AC
  Administered 2014-10-09: 650 mg via ORAL
  Filled 2014-10-09: qty 2

## 2014-10-09 NOTE — Progress Notes (Signed)
Wasted 190 cc of fentanyl gtt (38mcg/cc concentration) left after pt transferred from AICU on 11/ 3 evening.

## 2014-10-09 NOTE — Care Management Note (Signed)
    Page 1 of 1   10/09/2014     5:05:43 PM CARE MANAGEMENT NOTE 10/09/2014  Patient:  [name] [name], [name] [name]   Account Number:  [id number]  Date Initiated:  10/09/2014  Documentation initiated by:  Juliet Rude            Status of service:   Medicare Important Message given?  YES (If response is "NO", the following Medicare IM given date fields will be blank) Date Medicare IM given:  10/09/2014 Medicare IM given by:  Juliet Rude Date Additional Medicare IM given:   Additional Medicare IM given by:

## 2014-10-09 NOTE — Progress Notes (Signed)
2 Days Post-Op Procedure(s) (LRB): TOTAL ABDOMINAL HYSTERECTOMY WITH BILATERAL SALPINGO OOPHORECTOMY AND HALBANS CULDOELASTY  (Bilateral) ANTERIOR (CYSTOCELE) AND POSTERIOR REPAIR (RECTOCELE) (N/A) TRANSVAGINAL TAPE (TVT) EXACT PROCEDURE (N/A) CYSTOSCOPY (N/A)  Subjective: Pt continues to still have nausea.  No emesis.  Did have flatus.  Otherwise, feeling well.  Last pain medication was 2am with methadone--her standard dose.  Minimal bleeding.  Eating regular diet.  D/W pt hb from this morning.  Feel needs transfusion.  HIV and Hep C risks with blood transfusions d/w pt.  Also, pt aware of risks of transfusion reaction but type and screen will be done first.  As well, she will be pretreated with tylenol and benadryl.  All questions answered.  Objective: I have reviewed patient's vital signs, intake and output, medications and labs. Tm:  99.3, P 75 BS: 107, Hb: 7.3 General: alert and cooperative Resp: clear to auscultation bilaterally Cardio: regular rate and rhythm, S1, S2 normal, no murmur, click, rub or gallop GI: soft, non-tender; bowel sounds normal; no masses,  no organomegaly and incision: clean, dry and intact Extremities: extremities normal, atraumatic, no cyanosis or edema Vaginal Bleeding: minimal  Assessment: s/p Procedure(s) with comments: TOTAL ABDOMINAL HYSTERECTOMY WITH BILATERAL SALPINGO OOPHORECTOMY AND HALBANS CULDOELASTY  (Bilateral) - Whitleigh Garramone primary/Silva assist  4 hours total ANTERIOR (CYSTOCELE) AND POSTERIOR REPAIR (RECTOCELE) (N/A) TRANSVAGINAL TAPE (TVT) EXACT PROCEDURE (N/A) CYSTOSCOPY (N/A): stable, progressing well and anemia  Plan: Encourage ambulation bladder trial today  transfuse two units today.  Pretreat with tylenol and benadryl.  Post transfusion H&H. Will keep overnight to reassess labs in AM.  LOS: 2 days    [name] 10/09/2014, 7:08 AM

## 2014-10-09 NOTE — Progress Notes (Signed)
2 Days Post-Op Procedure(s) (LRB): TOTAL ABDOMINAL HYSTERECTOMY WITH BILATERAL SALPINGO OOPHORECTOMY AND HALBANS CULDOELASTY  (Bilateral) ANTERIOR (CYSTOCELE) AND POSTERIOR REPAIR (RECTOCELE) (N/A) TRANSVAGINAL TAPE (TVT) EXACT PROCEDURE (N/A) CYSTOSCOPY (N/A)  Subjective: Patient reports her nausea went away for about 6-8 hours after receiving tylenol and benadryl before the blood transfusions.  Is back now.  No emesis.  Continues to pass flatus.  Pain control is good.  Minimal vaginal bleeding.  Couldn't void so catheter is back in place to straight drain.  Pt reports feeling stronger with getting up and walking.  Objective: I have reviewed patient's vital signs, intake and output and medications.  General: alert and cooperative Resp: clear to auscultation bilaterally Cardio: regular rate and rhythm, S1, S2 normal, no murmur, click, rub or gallop GI: soft, non-tender; bowel sounds normal; no masses,  no organomegaly and incision: clean, dry and intact Extremities: extremities normal, atraumatic, no cyanosis or edema Vaginal Bleeding: minimal  Assessment: s/p Procedure(s) with comments: TOTAL ABDOMINAL HYSTERECTOMY WITH BILATERAL SALPINGO OOPHORECTOMY AND HALBANS CULDOELASTY  (Bilateral) - Araiyah Cumpton primary/Silva assist  4 hours total ANTERIOR (CYSTOCELE) AND POSTERIOR REPAIR (RECTOCELE) (N/A) TRANSVAGINAL TAPE (TVT) EXACT PROCEDURE (N/A) CYSTOSCOPY (N/A): stable and progressing well  Plan: Check post tranfusion hb tonight.  Repeat CBC in AM.  Encourage IS, low grade temp today.  No evidence of infection at this time. Possible bladder trial again tomorrow.  Dr. Quincy Simmonds making these recommendations.   Change pain medication to Dilaudid.  LOS: 2 days    Hale Bogus [facility] 10/09/2014, 8:11 PM

## 2014-10-09 NOTE — Progress Notes (Signed)
2 Days Post-Op Procedure(s) (LRB): TOTAL ABDOMINAL HYSTERECTOMY WITH BILATERAL SALPINGO OOPHORECTOMY AND HALBANS CULDOELASTY  (Bilateral) ANTERIOR (CYSTOCELE) AND POSTERIOR REPAIR (RECTOCELE) (N/A) TRANSVAGINAL TAPE (TVT) EXACT PROCEDURE (N/A) CYSTOSCOPY (N/A)  Subjective: Patient reports nausea.   Received transfusion of 2 units of packed red blood cells today.  Feels warm.  Ambulating with her walker.  Foley replaced due to urinary retention.  No void.   Objective: I have reviewed patient's vital signs and intake and output. T 100.1 BP 130/48 P 74  RR 18 I/O - 615/1800 - clear, very light yellow.  PVRs 900 and 500 cc.  General: alert Resp: clear to auscultation bilaterally Cardio: regular rate and rhythm, S1, S2 normal, no murmur, click, rub or gallop GI: soft, non-tender; bowel sounds normal; no masses,  no organomegaly and incision: clean, dry and intact Vaginal Bleeding: minimal  Assessment: s/p Procedure(s) with comments: TOTAL ABDOMINAL HYSTERECTOMY WITH BILATERAL SALPINGO OOPHORECTOMY AND HALBANS CULDOELASTY  (Bilateral) - Miller primary/Silva assist  4 hours total ANTERIOR (CYSTOCELE) AND POSTERIOR REPAIR (RECTOCELE) (N/A) TRANSVAGINAL TAPE (TVT) EXACT PROCEDURE (N/A) CYSTOSCOPY (N/A): stable  Status post transfusion of 2 RBCs.  Low grade temperature from probable atelectasis and transfusion.  Urinary retention.  Diastolic hypotension.   Plan:  Hgb recheck tonight and in am.  Zofran po for nausea.  Keep foley catheter in. Hold Coreg.    LOS: 2 days    Arloa Koh 10/09/2014, 7:16 PM

## 2014-10-09 NOTE — Progress Notes (Signed)
2 Days Post-Op Procedure(s) (LRB): TOTAL ABDOMINAL HYSTERECTOMY WITH BILATERAL SALPINGO OOPHORECTOMY AND HALBANS CULDOELASTY  (Bilateral) ANTERIOR (CYSTOCELE) AND POSTERIOR REPAIR (RECTOCELE) (N/A) TRANSVAGINAL TAPE (TVT) EXACT PROCEDURE (N/A) CYSTOSCOPY (N/A)  Subjective: Patient reports + flatus.   Foley out and no void yet.  Some dizziness.   Objective: I have reviewed patient's vital signs, intake and output and labs. Hgb 7.3, platelets 130,000, WBC 8.2. Glucose 107 General: alert Resp: clear to auscultation bilaterally Cardio: regular rate and rhythm, S1, S2 normal, no murmur, click, rub or gallop GI: soft, non-tender; bowel sounds normal; no masses,  no organomegaly and incision: clean, dry, intact and  left suprapubic incision with ecchymoses. Vaginal Bleeding: minimal  Assessment: s/p Procedure(s) with comments: TOTAL ABDOMINAL HYSTERECTOMY WITH BILATERAL SALPINGO OOPHORECTOMY AND HALBANS CULDOELASTY  (Bilateral) - Miller primary/Silva assist  4 hours total ANTERIOR (CYSTOCELE) AND POSTERIOR REPAIR (RECTOCELE) (N/A) TRANSVAGINAL TAPE (TVT) EXACT PROCEDURE (N/A) CYSTOSCOPY (N/A): anemia  Clinically no signs of intraperitoneal or surgical bleeding.  Anemia attributed to an underestimate of EBL in surgery.    Plan: Advance diet Encourage ambulation  Voiding trials.   Transfuse 2 units of pRBCs today and Hgb recheck after transfusion.  Continue in hospital care.  No imaging needed at this time.   LOS: 2 days    Arloa Koh 10/09/2014, 7:58 AM

## 2014-10-09 NOTE — Plan of Care (Signed)
Problem: Discharge Progression Outcomes Goal: Pain controlled with appropriate interventions Outcome: Completed/Met Date Met:  10/09/14 Goal: Hemodynamically stable Outcome: Completed/Met Date Met:  10/09/14

## 2014-10-10 LAB — CBC
HEMATOCRIT: 26.4 % — AB (ref 36.0–46.0)
HEMATOCRIT: 30 % — AB (ref 36.0–46.0)
HEMOGLOBIN: 8.9 g/dL — AB (ref 12.0–15.0)
HEMOGLOBIN: 10.3 g/dL — AB (ref 12.0–15.0)
MCH: 30.8 pg (ref 26.0–34.0)
MCH: 31.4 pg (ref 26.0–34.0)
MCHC: 33.7 g/dL (ref 30.0–36.0)
MCHC: 34.3 g/dL (ref 30.0–36.0)
MCV: 91.3 fL (ref 78.0–100.0)
MCV: 91.5 fL (ref 78.0–100.0)
Platelets: 128 10*3/uL — ABNORMAL LOW (ref 150–400)
Platelets: 158 10*3/uL (ref 150–400)
RBC: 2.89 MIL/uL — ABNORMAL LOW (ref 3.87–5.11)
RBC: 3.28 MIL/uL — AB (ref 3.87–5.11)
RDW: 15.4 % (ref 11.5–15.5)
RDW: 15.2 % (ref 11.5–15.5)
WBC: 6.9 10*3/uL (ref 4.0–10.5)
WBC: 7.7 10*3/uL (ref 4.0–10.5)

## 2014-10-10 LAB — TYPE AND SCREEN
ABO/RH(D): O POS
Antibody Screen: NEGATIVE
Unit division: 0
Unit division: 0

## 2014-10-10 LAB — BASIC METABOLIC PANEL
Anion gap: 10 (ref 5–15)
BUN: 8 mg/dL (ref 6–23)
CO2: 32 meq/L (ref 19–32)
Calcium: 8.5 mg/dL (ref 8.4–10.5)
Chloride: 98 mEq/L (ref 96–112)
Creatinine, Ser: 0.73 mg/dL (ref 0.50–1.10)
GFR calc Af Amer: >90 mL/min (ref >90)
GFR, EST NON AFRICAN AMERICAN: 83 mL/min — AB (ref >90)
GLUCOSE: 133 mg/dL — AB (ref 70–99)
POTASSIUM: 3.9 meq/L (ref 3.7–5.3)
Sodium: 140 mEq/L (ref 137–147)

## 2014-10-10 MED ORDER — AMOXICILLIN-POT CLAVULANATE 875-125 MG PO TABS: 1.0000 | ORAL_TABLET | Freq: Two times a day (BID) | ORAL | Status: DC

## 2014-10-10 NOTE — Progress Notes (Signed)
3 Days Post-Op Procedure(s) (LRB): TOTAL ABDOMINAL HYSTERECTOMY WITH BILATERAL SALPINGO OOPHORECTOMY AND HALBANS CULDOELASTY  (Bilateral) ANTERIOR (CYSTOCELE) AND POSTERIOR REPAIR (RECTOCELE) (N/A) TRANSVAGINAL TAPE (TVT) EXACT PROCEDURE (N/A) CYSTOSCOPY (N/A)  Subjective: Patient reports tolerating PO and no problems voiding.   PVRs under 100.  No catheterization for residuals.  Ambulated several times today.   Objective: I have reviewed patient's vital signs, intake and output, labs and pathology. Temp 100, BP 117/45, P 72, RR 14 UO 2125 cc WBC 7.7, Hgb 1-0.3 Pathology - atypical endometrial hyperplasia and uterine leiomyomata, normal cervix, normal tubes and ovaries  General: alert GI: incision: clean, dry, intact, erythematous and  Ecchymoses.  Erythema of left superior area.   Assessment: s/p Procedure(s) with comments: TOTAL ABDOMINAL HYSTERECTOMY WITH BILATERAL SALPINGO OOPHORECTOMY AND HALBANS CULDOELASTY  (Bilateral) - Miller primary/Silva assist  4 hours total ANTERIOR (CYSTOCELE) AND POSTERIOR REPAIR (RECTOCELE) (N/A) TRANSVAGINAL TAPE (TVT) EXACT PROCEDURE (N/A) CYSTOSCOPY (N/A): progressing well, tolerating diet, fever and  Anemia improved.  Early cellulitis.  Patient ready for discharge.  Atypical endometrial hyperplasia.   Plan: Discharge home  Will have patient take Augmentin 875 mg po bid.   Dilaudid Rx for pain.  Has at home.  Will resume usual medications except Coreg which she will hold until BP 130/80. Restrictions and precautions given.  Follow up on 4 days, sooner as needed.  Pathology discussed.    LOS: 3 days    Arloa Koh 10/10/2014, 6:37 PM

## 2014-10-10 NOTE — Progress Notes (Signed)
3 Days Post-Op Procedure(s) (LRB): TOTAL ABDOMINAL HYSTERECTOMY WITH BILATERAL SALPINGO OOPHORECTOMY AND HALBANS CULDOELASTY  (Bilateral) ANTERIOR (CYSTOCELE) AND POSTERIOR REPAIR (RECTOCELE) (N/A) TRANSVAGINAL TAPE (TVT) EXACT PROCEDURE (N/A) CYSTOSCOPY (N/A)  Subjective: Patient still with mild nausea and no emesis.  Nausea resolved x 2 after benadryl.  Pt with multiple allergies.  Feel this nausea may be related to taking hospital formulary medications.  Ambulating well.  Getting out of bed on her own.  Reports a little dizziness this morning the first time she got out of bed.  Still passing flatus.  Took nothing for pain overnight outside her normal methadone dose.  Objective: I have reviewed patient's vital signs, intake and output, medications and labs. Post transfusion hb 9.2.  AM hb 8.9 T max 100.1 1705 yesterday Other VSS General: alert and cooperative Resp: clear to auscultation bilaterally Cardio: regular rate and rhythm, S1, S2 normal, no murmur, click, rub or gallop GI: soft, non-tender; bowel sounds normal; no masses,  no organomegaly and incision: clean, dry, intact and dressing removed Extremities: extremities normal, atraumatic, no cyanosis or edema Vaginal Bleeding: minimal  Assessment: s/p Procedure(s) with comments: TOTAL ABDOMINAL HYSTERECTOMY WITH BILATERAL SALPINGO OOPHORECTOMY AND HALBANS CULDOELASTY  (Bilateral) - Neva Ramaswamy primary/Silva assist  4 hours total ANTERIOR (CYSTOCELE) AND POSTERIOR REPAIR (RECTOCELE) (N/A) TRANSVAGINAL TAPE (TVT) EXACT PROCEDURE (N/A) CYSTOSCOPY (N/A): progressing well  Persistent mild nausea that is improved with benadryl?  Drug sensitivity? Anemia, improved post transfusion x 2  Plan: Will await Dr. Elza Rafter recommendations regarding bladder trials/catheter   LOS: 3 days    Hale Bogus [facility] 10/10/2014, 7:12 AM

## 2014-10-10 NOTE — Progress Notes (Signed)
3 Days Post-Op Procedure(s) (LRB): TOTAL ABDOMINAL HYSTERECTOMY WITH BILATERAL SALPINGO OOPHORECTOMY AND HALBANS CULDOELASTY  (Bilateral) ANTERIOR (CYSTOCELE) AND POSTERIOR REPAIR (RECTOCELE) (N/A) TRANSVAGINAL TAPE (TVT) EXACT PROCEDURE (N/A) CYSTOSCOPY (N/A)  Subjective: Patient reports nausea. Some dizziness.  Not a lot of ambulation.  Foley out.  No void yet.     Objective: I have reviewed patient's vital signs, intake and output and labs. BP now 107/46.  Pulse 74.  RR 18.  Temp 98.5. UO 3750 cc recorded. - 11/4 07:00 am until now.  Hgb last hs 9.2, now Hgb 8.9.  General: alert Resp: clear to auscultation bilaterally Cardio: regular rate and rhythm, S1, S2 normal, no murmur, click, rub or gallop GI: soft, non-tender; bowel sounds normal; no masses,  no organomegaly and incision:  Ecchymoses and some induration of upper margin of incision.  Vaginal Bleeding: minimal  Assessment: s/p Procedure(s) with comments: TOTAL ABDOMINAL HYSTERECTOMY WITH BILATERAL SALPINGO OOPHORECTOMY AND HALBANS CULDOELASTY  (Bilateral) - Miller primary/Silva assist  4 hours total ANTERIOR (CYSTOCELE) AND POSTERIOR REPAIR (RECTOCELE) (N/A) TRANSVAGINAL TAPE (TVT) EXACT PROCEDURE (N/A) CYSTOSCOPY (N/A): stable and anemia  Status post transfusion of 2 units of packed RBCs.    Plan: Encourage ambulation Advance to PO medication  Voiding trials.   Dilaudid for pain medication.  Recheck CBC and BMP this afternoon.   LOS: 3 days    Arloa Koh 10/10/2014, 8:05 AM

## 2014-10-10 NOTE — Discharge Instructions (Signed)
Abdominal Hysterectomy, Care After Refer to this sheet in the next few weeks. These instructions provide you with information on caring for yourself after your procedure. Your health care provider may also give you more specific instructions. Your treatment has been planned according to current medical practices, but problems sometimes occur. Call your health care provider if you have any problems or questions after your procedure.  WHAT TO EXPECT AFTER THE PROCEDURE After your procedure, it is typical to have the following:  Pain.  Feeling tired.  Poor appetite.  Less interest in sex. HOME CARE INSTRUCTIONS  It takes 4-6 weeks to recover from this surgery. Make sure you follow all your health care provider's instructions. Home care instructions may include:  Take pain medicines only as directed by your health care provider. Do not take over-the-counter pain medicines without checking with your health care provider first.  Change your bandage as directed by your health care provider.  Return to your health care provider to have your sutures taken out.  Take showers instead of baths for 2-3 weeks. Ask your health care provider when it is safe to start showering.  Do not douche, use tampons, or have sexual intercourse for at least 6 weeks or until your health care provider says you can.   Follow your health care provider's advice about exercise, lifting, driving, and general activities.  Get plenty of rest and sleep.   Do not lift anything heavier than a gallon of milk (about 10 lb [4.5 kg]) for the first month after surgery.  You can resume your normal diet if your health care provider says it is okay.   Do not drink alcohol until your health care provider says you can.   If you are constipated, ask your health care provider if you can take a mild laxative.  Eating foods high in fiber may also help with constipation. Eat plenty of raw fruits and vegetables, whole grains, and  beans.  Drink enough fluids to keep your urine clear or pale yellow.   Try to have someone at home with you for the first 1-2 weeks to help around the house.  Keep all follow-up appointments. SEEK MEDICAL CARE IF:   You have chills or fever.  You have swelling, redness, or pain in the area of your incision that is getting worse.   You have pus coming from the incision.   You notice a bad smell coming from the incision or bandage.   Your incision breaks open.   You feel dizzy or light-headed.   You have pain or bleeding when you urinate.   You have persistent diarrhea.   You have persistent nausea and vomiting.   You have abnormal vaginal discharge.   You have a rash.   You have any type of abnormal reaction or develop an allergy to your medicine.   Your pain medicine is not helping.  SEEK IMMEDIATE MEDICAL CARE IF:   You have a fever and your symptoms suddenly get worse.  You have severe abdominal pain.  You have chest pain.  You have shortness of breath.  You faint.  You have pain, swelling, or redness of your leg.  You have heavy vaginal bleeding with blood clots. MAKE SURE YOU:  Understand these instructions.  Will watch your condition.  Will get help right away if you are not doing well or get worse. Document Released: 06/11/2005 Document Revised: 11/27/2013 Document Reviewed: 09/14/2013 [facility] Patient Information 2015 [place], [place]. This information is not intended  to replace advice given to you by your health care provider. Make sure you discuss any questions you have with your health care provider.  Anterior and Posterior Colporrhaphy, Sling Procedure, Care After Refer to this sheet in the next few weeks. These instructions provide you with information on caring for yourself after your procedure. Your health care provider may also give you more specific instructions. Your treatment has been planned according to current medical  practices, but problems sometimes occur. Call your health care provider if you have any problems or questions after your procedure.  HOME CARE INSTRUCTIONS  Rest as much as possible during the first 2 weeks after the procedure.   Avoid heavy lifting (more than 10 pounds [4.5 kg]), pushing, or pulling. Limit stair climbing to once or twice a day the first week, then slowly increase this activity.   Avoid standing for prolonged periods of time.   Talk with your health care provider about when you may resume your usual physical activity.   You may resume your normal diet right away.   Drink at least 6-8 glasses of non-caffeinated beverages per day.   Eat a well-balanced diet. Daily portions of food from the meat (protein), milk, fruit, vegetable, and bread families are necessary for your health.   Your normal bowel function should return. If you become constipated, you may:   Take a mild laxative.  Add fruit and bran to your diet.  Drink more liquids.  You may take a shower and wash your hair.   Only take over-the-counter or prescription medicines as directed by your health care provider.   Clean the incision with water. Do not use a dressing unless the incision is draining or irritated. Check your incision daily for redness, draining, swelling, or separation of the skin.   Follow any bladder care instructions provided by your health care provider.   Keep your perineal area (the area between vagina and rectum) clean and dry. Perform perineal care after every bowel movement and each time you urinate. You may take a sitz bath or sit in a tub of clean, warm water when necessary, unless your health care provider tells you otherwise.   Do not have sexual intercourse until permitted by your health care provider.   Follow up with your health care provider as directed.  SEEK MEDICAL CARE IF:  You have shaking chills.   Your pain is not relieved with medicine or becomes  worse.  You have frequent or urgent urination, or you are unable to completely empty your bladder.   You feel a burning sensation when urinating.   You see pus coming from the wounds.  SEEK IMMEDIATE MEDICAL CARE IF:  You develop a fever.  You notice redness, drainage, swelling, or separation of the skin at the incision site.  You have difficulty breathing.  You are unable to urinate. MAKE SURE YOU:   Understand these instructions.  Will watch your condition.  Will get help right away if you are not doing well or get worse. Document Released: 07/06/2004 Document Revised: 07/25/2013 Document Reviewed: 04/13/2013 [facility] Patient Information 2015 [place], [place]. This information is not intended to replace advice given to you by your health care provider. Make sure you discuss any questions you have with your health care provider.   Urethral Vaginal Sling, Care After Refer to this sheet in the next few weeks. These instructions provide you with information on caring for yourself after your procedure. Your health care provider may also give you  more specific instructions. Your treatment has been planned according to current medical practices, but problems sometimes occur. Call your health care provider if you have any problems or questions after your procedure.  WHAT TO EXPECT AFTER THE PROCEDURE  After your procedure, it is typical to have the following:  A catheter in your bladder until your bladder is able to work on its own properly. You will be instructed on how to empty the catheter bag.  Absorbable stitches in your incisions. They will slowly dissolve over 1-2 months. HOME CARE INSTRUCTIONS  Get plenty of rest.  Only take over-the-counter or prescription medicines as directed by your health care provider. Do not take aspirin because it can cause bleeding.  Do not take baths. Take showers until your health care provider tells you otherwise.  You may resume your usual  diet. Eat a well-balanced diet.  Drink enough fluids to keep your urine clear or pale yellow.  Limit exercise and activities as directed by your health care provider. Do not lift anything heavier than 5 pounds (2.3 kg).  Do not douche, use tampons, or have sexual intercourse for 6 weeks after your procedure.  Follow up with your health care provider as directed. SEEK MEDICAL CARE IF:  You have a heavy or bad smelling vaginal discharge.   You have a rash.   You have pain that is not controlled with medicines.   You have lightheadedness or feel faint.  SEEK IMMEDIATE MEDICAL CARE IF:  You have a fever.   You have vaginal bleeding.   You faint.   You have shortness of breath.   You have chest, abdominal, or leg pain.   You have pain when urinating or cannot urinate.   Your catheter is still in your bladder and becomes blocked.   You have swelling, redness, and pain in the vaginal area.  Document Released: 09/12/2013 Document Reviewed: 09/12/2013 [facility] Patient Information 2015 [place], [place]. This information is not intended to replace advice given to you by your health care provider. Make sure you discuss any questions you have with your health care provider.

## 2014-10-12 NOTE — Discharge Summary (Signed)
Physician Discharge Summary  Patient ID: [name] [name] MRN: [id number] DOB/AGE: Jan 22, 1941 73 y.o.  Admit date: 10/07/2014 Discharge date: 10/10/14  Admission Diagnoses:   1.  Complete uterovaginal prolapse 2.  Genuine stress incontinence  Discharge Diagnoses:  1.  Complete uterovaginal prolapse 2.  Genuine stress incontinence 3.  Total abdominal hysterectomy with bilateral salpingo-oophorectomy with Halban's culdoplasty with anterior and posterior colporrhaphy with TVT Exact midurethral sling and cystoscopy - Dr. Edwinna Areola and Dr. Josefa Half. 4.  Postoperative anemia - status post transfusion of 2 units of packed red blood cells. 5.  Incisional cellulitis.  Active Problems:   Pelvic prolapse   S/P TAH (total abdominal hysterectomy)   Discharged Condition: good  Hospital Course:  The patient was admitted on 10/07/14 and underwent a total abdominal hysterectomy with bilateral salpingo-oophorectomy with Halban's culdoplasty with anterior and posterior colporrhaphy with TVT Exact midurethral sling and cystoscopy which were performed without complication while under combination of indwelling spinal catheter and general anesthesia.  An abdominosacrocolpopexy was planned, however, the patients anatomy did not safely lend itself to this procedure.  There was not good visualization of the presacral space with middle sacral vessels and surrounding anatomy.  The patient has a history of chronic back pain and multiple prior surgeries and she is maintained on Methadone and Neurontin for chronic pain and she is unable to take NSAIDs due to a prior peptic ulcer.  The patient's post op course was notable for the following: postoperative hypotension, postoperative anemia requiring transfusion, and incisional cellulitis.  She received her first 24 hours of post op care in the [place].  Her spinal was discontinued on post op day one, and her pain was then controlled with a combination of methadone, Oxycotin IR,  and then Dilaudid.  She experienced dizziness, nausea, and a low grade fever during her recovery.  When her hemoglobin returned at 7.3 on 10/09/14, a transfusion with 2 units of packed red blood cells was recommended.  Her anemia was attributed to an underestimate of EBL intraop.  She demonstrated no signs of an acute abdomen.  Her post transfusion hemoglobin settled at 10.3 on 10/10/14, prior to her discharge.  Her Coreg is held until after discharge.   She wore PAS and Ted hose for DVT prophylaxis while in bed.  She was able to ambulate well prior to discharge.  She brought a walker from home which she uses on occasion.  Her vaginal packing was removed on post op day one, and she had no significant bleeding.   Her foley catheter were removed on post op day two, and she demonstrated urinary retention, so it was replaced.  On 10/10/14 she underwent repeat voiding trials and voided well with residual volumes of under 100 cc by bladder ultrasound.  She was therefore discharged home without a foley catheter.    The patient was able to tolerate a regular diet prior to discharge, and her nausea was improved.    The patient's incision demonstrated ecchymoses which was anticipated due to subcutaneous bleeding upon entry of the abdomen at the time of surgery.  On the evening of discharge, the patient was noted to have slight erythema of the region to the left and superior of her incision.  Her temperature was 100 degrees F, and her WBC was 7.7 a couple of hours prior.  She was deemed stable for discharge and was given a prescription for Augmentin with a plan for follow up in the office in 4 days and sooner if  needed due any increase in temperature or signs of progression of cellulitis.    She was given wound precautions and general precautions for surgical recovery in written and oral form.  Consults: None  Significant Diagnostic Studies: labs:  Please see hospital course above. Pathology report showed  leiomyomata, atypical endometrial hyperplasia and no evidence of malignancy.  The cervix, tubes and ovaries were benign and unremarkable.  Treatments:  1.  surgery: Total abdominal hysterectomy with bilateral salpingo-oophorectomy with Halban's culdoplasty with anterior and posterior colporrhaphy with TVT Exact midurethral sling and cystoscopy - Dr. Edwinna Areola and Dr. Josefa Half.   2.  transfusion of 2 units of packed red blood cells.   Discharge Exam: Blood pressure 117/45, pulse 72, temperature 100 F (37.8 C), temperature source Oral, resp. rate 14, height 5\' 3"  (1.6 m), weight 190 lb (86.183 kg), last menstrual period 12/06/2000, SpO2 97 %. General: alert GI: incision: clean, dry, intact, erythematous and  Ecchymoses. Erythema of left superior area.  Disposition: 01-Home or Self Care     Medication List    STOP taking these medications        ampicillin 500 MG capsule  Commonly known as:  PRINCIPEN     carvedilol 6.25 MG tablet  Commonly known as:  COREG     nystatin-triamcinolone ointment  Commonly known as:  MYCOLOG      TAKE these medications        amoxicillin-clavulanate 875-125 MG per tablet  Commonly known as:  AUGMENTIN  Take 1 tablet by mouth 2 (two) times daily. Take one tablet BID x 7D.     aspirin EC 81 MG tablet  Take 81 mg by mouth daily.     Biotin 5000 MCG Caps  Take 1 capsule by mouth daily.     CALCIUM 1200 PO  Take 1,200 mg by mouth daily.     diazepam 5 MG tablet  Commonly known as:  VALIUM  Take 5 mg by mouth every 8 (eight) hours as needed for muscle spasms (for back spasms after surgery).     EPINEPHrine 0.3 mg/0.3 mL Devi  Commonly known as:  EPI-PEN  Inject 0.3 mg into the muscle once. For various allergic reactions.     FLAX SEED OIL PO  Take 2,000 mg by mouth daily.     furosemide 40 MG tablet  Commonly known as:  LASIX  Take 40 mg by mouth daily.     gabapentin 600 MG tablet  Commonly known as:  NEURONTIN  Take 600 mg  by mouth 3 (three) times daily.     HYDROmorphone 2 MG tablet  Commonly known as:  DILAUDID  Take 1 tablet (2 mg total) by mouth every 4 (four) hours as needed.     levothyroxine 75 MCG tablet  Commonly known as:  SYNTHROID, LEVOTHROID  Take 75 mcg by mouth daily.     methadone 5 MG tablet  Commonly known as:  DOLOPHINE  Take 5 mg by mouth 4 (four) times daily.     multivitamin with minerals Tabs tablet  Take 1 tablet by mouth daily.     pravastatin 20 MG tablet  Commonly known as:  PRAVACHOL  Take 20 mg by mouth at bedtime.     PRESERVISION/LUTEIN PO  Take 1 tablet by mouth 2 (two) times daily.     senna 8.6 MG tablet  Commonly known as:  SENOKOT  Take 2-3 tablets by mouth 2 (two) times daily. 2 tablets in the morning, 3 tablets  in the evening     VITAMIN C PO  Take 1,000 mg by mouth daily.      Patient will begin her Coreg when her blood pressure returns to approximately 130/80 at home. She is able to take her blood pressure there and is accustomed to doing so.     Follow-up Information    Follow up with Arloa Koh, MD In 4 days.   Specialty:  Obstetrics and Gynecology   Contact information:   [address] [place] [address] [phone]       Follow up with Lyman Speller, MD In 4 days.   Specialty:  Gynecology   Contact information:   [address] [place] [address] [phone]       Signed: Arloa Koh 10/12/2014, 4:08 AM

## 2014-10-14 ENCOUNTER — Encounter: Payer: Self-pay | Admitting: Obstetrics and Gynecology

## 2014-10-14 ENCOUNTER — Ambulatory Visit (INDEPENDENT_AMBULATORY_CARE_PROVIDER_SITE_OTHER): Payer: Medicare Other | Admitting: Obstetrics and Gynecology

## 2014-10-14 VITALS — BP 154/64 | HR 90 | Temp 99.1°F | Resp 12 | Ht 63.25 in | Wt 194.0 lb

## 2014-10-14 DIAGNOSIS — D649 Anemia, unspecified: Secondary | ICD-10-CM

## 2014-10-14 DIAGNOSIS — Z9889 Other specified postprocedural states: Secondary | ICD-10-CM

## 2014-10-14 NOTE — Op Note (Signed)
NAME:  MARCELLA, [name] [name]                    ACCOUNT NO.:  [id number]  MEDICAL RECORD NO.:  [id number]  LOCATION:                                 FACILITY:  PHYSICIAN:  Lenard Galloway, M.D.        DATE OF BIRTH:  DATE OF PROCEDURE:  10/07/2014 DATE OF DISCHARGE:                              OPERATIVE REPORT   PREOPERATIVE DIAGNOSIS:  Incomplete uterovaginal prolapse.  POSTOPERATIVE DIAGNOSIS:  Incomplete uterovaginal prolapse.  PROCEDURE:  Halban culdoplasty, anterior and posterior colporrhaphy, TVT Exact midurethral sling and cystoscopy.  SURGEON:  Lenard Galloway, M.D.  ASSISTANT:  Lyman Speller M.D.  ANESTHESIA:  General endotracheal, spinal, local with 1% lidocaine with epinephrine 1:100,000.  TOTAL IV FLUIDS:  For procedure including hysterectomy 2300 mL Ringer's lactate.  TOTAL URINE OUTPUT:  1300 mL clear yellow urine.  ESTIMATED BLOOD LOSS:  50 mL from prolapse repair, 250 mL from total abdominal hysterectomy.  INDICATIONS FOR THE PROCEDURE:  The patient is a 73 year old gravida 5, para 2 Caucasian female, who presented with pelvic organ prolapse.  On office exam, the patient was noted to have a third-degree cystocele with almost second-degree uterine prolapse and a first-degree rectocele.  The patient had multichannel urodynamic testing performed in the office with reduction of the prolapse and the patient demonstrated evidence of genuine stress incontinence at maximum bladder capacity.  The patient has requested surgical evaluation and treatment of her pelvic organ prolapse and genuine stress incontinence and the plan is made to proceed at this time with a total abdominal hysterectomy with bilateral salpingo-oophorectomy under the direction of the patient's primary gynecologist, Dr. Edwinna Areola, and then proceed with an abdominal sacrocolpopexy, along with an anterior and posterior colporrhaphy, and a TVT Exact midurethral sling with cystoscopy.   Risks, benefits, and alternatives have been reviewed with the patient who wishes to proceed.  FINDINGS:  Examination under anesthesia revealed a third-degree cystocele and almost second-degree uterine prolapse.  There was a first- degree rectocele.  The patient's uterus was noted to be small and there were no adnexal masses appreciated.  At the time of laparotomy, the patient was noted to have a very deep pelvis.  The presacral space was noted to have bifurcation of the aorta low along the presacral region.  There was a significant adiposity overlying the sacral promontory.  With dissection of this presacral space, the middle sacral vessels could not be easily identified.  The sigmoid colon was noted to be prolapsing into the posterior cul-de-sac and in general, anatomic landmarks were difficult to identify in the presacral region.  Cystoscopy performed with placement of the midurethral sling documented the absence of a foreign body in the bladder or the urethra.  The bladder was visualized throughout 360 degrees including the bladder dome and trigone.  The ureters were noted to be patent bilaterally.  DESCRIPTION OF PROCEDURE:  The patient was reidentified in the preoperative hold area.  She did receive cefotetan 2 g IV for antibiotic prophylaxis and PAS stockings and TED hose for DVT prophylaxis.  In the operating room, the patient received an intended epidural anesthetic which actually ended up being a  spinal anesthetic due to scarring along the spine from her prior surgeries.  Please refer to the Anesthesia report.  The patient then subsequently did undergo general endotracheal anesthesia.  The patient was placed in the dorsal lithotomy position.  The arms were tucked at the sides.  The abdomen, vagina, and perineum were then sterilely prepped and the patient was draped.  A Foley catheter was placed inside the bladder.  Dr. Sabra Heck proceeded at this time with a total abdominal  hysterectomy with bilateral salpingo-oophorectomy.  Please refer to this dictation separately.  At the termination of the hysterectomy procedure, the surgical field was noted to be dry.  At this time, I explored the presacral space further. I identified the sacral promontory.  While holding the sigmoid colon off to the patient's left-hand side, I exposed the presacral space and smooth forceps were used to lift the peritoneum off overlying this region.  I carefully opened the space in a vertical fashion using a Metzenbaum scissors.  The right ureter was identified along the right pelvic sidewall and the peritoneal incision was extended in an inferior fashion towards the region of the cul-de-sac.  Careful sharp and blunt dissection continued over the presacral space in order to remove the overlying adiposity and gain good access into this region.  The bifurcation of the aorta was noted to be low.  When gaining access to the sacral promontory, despite dissection in the middle, sacral vessels could not be well identified.  The decision was made to discontinue with further dissection and not perform the abdominal sacrocolpopexy due to concern over safety of placement of the mesh and having adequate visualization.  The peritoneum overlying the sacral promontory was closed with a running suture of 2-0 Vicryl.  Hemostasis was excellent during the dissection and closure of the space.  Attention was turned to the cul-de-sac region where Halban culdoplasty was performed with 4 sutures of 0 Ethibond.  Two sutures were placed to the right of the rectum and 2 sutures were placed to the left of the rectum.  These were placed in a pursestring fashion in a cranial caudal direction.  The sutures were placed after identification of the ureters bilaterally.  This provided excellent closure of the very deep cul-de- sac region.  Hemostasis was good.  At this time, the abdomen was closed by Dr. Sabra Heck.   Again, please refer to this dictation separately.  I then began the vaginal prolapse repair.  A weighted speculum was placed inside the vagina.  The Foley catheter had been left to gravity drainage throughout the hysterectomy procedure.  Allis clamps were used to mark the anterior vaginal wall in the midline.  The mucosa was injected locally with 1% lidocaine with epinephrine 1:100,000.  The mucosa was incised vertically in the midline and then sharp and blunt dissection were used to dissect the subvaginal tissue and bladder off the overlying mucosa bilaterally.  The dissection was carried back to the level of the pubic rami and then down to the level of the vaginal cuff.  1 cm suprapubic incisions were then created 2 cm to the right and left of the midline.  The Foley catheter was removed and the obturator and guide was then placed inside the urethra.  The TVT Exact was placed in a bottom-up fashion.  The TVT needle with the plastic sheaths were placed through the right retropubic space and then up through the ipsilateral suprapubic incision as the urethra was deflected properly away.  The same was performed  then on the opposite side, again deflecting the urethra away from the trajectory of the TVT needle and plastic sheaths. The obturator and guide were then removed and cystoscopy was performed and the findings are as noted above.  Sling was brought up through the suprapubic incisions bilaterally.  A Kelly clamp was placed between the sling and the urethra as the plastic sheaths were removed bilaterally. Excess sling was trimmed.  The sling was noted to be in excellent position.  Vertical mattress sutures were then used to reduce the cystocele.  Vertical mattress suture of 2-0 Vicryl was placed underneath the midurethral sling and the remaining sutures for the cystocele repair were then vertical mattress sutures of 0 Vicryl.  This provided excellent reduction of the cystocele.   Excess vaginal mucosa was then trimmed and the anterior vaginal wall was closed with a running lock suture of 2-0 Vicryl.  Allis clamps were then used to mark the perineal body and the posterior vaginal wall in the midline.  The mucosa was injected locally with 1% lidocaine with epinephrine 1:100,000.  A triangular wedge of epithelium was excised from the perineal body and then the posterior vaginal mucosa was incised in the midline using the Metzenbaum scissors.  Sharp and blunt dissection were used to dissect the rectovaginal mucosa off the overlying the rectovaginal fascia off the overlying vaginal mucosa bilaterally.  Hemostasis was good during the dissection.  At the top of the rectocele repair, figure-of-eight suture of 2-0 Vicryl was placed.  This was then transitioned to vertical mattress sutures. The top suture was 2-0 Vicryl and the remaining sutures were 0 Vicryl. This provided excellent reduction of the rectocele.  Excess vaginal mucosa was then trimmed and the posterior vaginal mucosa was closed with a running locked suture of 2-0 Vicryl.  A crown stitch of 0 Vicryl was placed along the perineal body.  The remaining suture was brought through the transverse perineal muscles in a running fashion and then up the perineal body in a subcuticular fashion as for an episiotomy repair. The knot was tied just outside the hymen.  Final rectal examination confirmed the absence of sutures in the rectum. A gauze packing with Estrace was then placed inside the vagina. Hemostasis was noted to be good.  A Foley catheter was left to gravity drainage.  The suprapubic incisions were closed with subcuticular sutures of 4-0 Vicryl and Dermabond was placed over the incisions.  The patient was cleansed of any remaining Betadine.  She was awakened and extubated and escorted to the recovery room in stable condition. There were no complications to the procedure.  All needle, instrument, and  sponge counts were correct.     Lenard Galloway, M.D.     BES/MEDQ  D:  10/07/2014  T:  10/08/2014  Job:  [id number]

## 2014-10-14 NOTE — Progress Notes (Signed)
GYNECOLOGY  VISIT   HPI: 73 y.o.   Married  Caucasian  female   G10P2 with Patient's last menstrual period was 12/06/2000.   here for  1 week post op Status post total abdominal hysterectomy with bilateral salpingo-oophorectomy, Halban's culdoplasty, anterior and posterior colporrhaphy with cystoscopy. Status post transfusion of 2 units of packed red blood cells for post op anemia attributed to an underestimate of EBL from surgery.  No acute abdomen post op.  Had mild post op temps to about 100 degrees F. Developed mild cellulitis of the left superior wound at the time of discharge and was placed on Augmentin po.   Since going home, has had some low grade temps at home up to 100 degrees F.  Having some diarrhea with abx Augmentin. Has some cramping when voids and has a bowel movement.  Taking Dilaudid 6 times a day.  Also taking her Methadone as usual for her chronic back pain.  Voiding well.   Hgb 10.8  GYNECOLOGIC HISTORY: Patient's last menstrual period was 12/06/2000.          OB History    Gravida Para Term Preterm AB TAB SAB Ectopic Multiple Living   9 2        2          Patient Active Problem List   Diagnosis Date Noted  . S/P TAH (total abdominal hysterectomy) 10/08/2014  . Pelvic prolapse 10/07/2014  . Cystocele 08/16/2014  . Glenohumeral arthritis 05/09/2014  . 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 Hypertension   . Hypothyroidism   . GERD (gastroesophageal reflux disease)   . Chronic pain     lower back  . Bronchitis     history only  . Ulcer     hx  . Virus not detected 2010    postop, caught virus in hosp., respiratory symptoms, upon d/c to home, had allergic reaction /w plate size hives - not sure of the cause    . Sinus infection     recent sinus infection, finished Zpak- 08/16/2013  . Female bladder prolapse   . Anemia     took iron for a while  . SVD (spontaneous vaginal delivery)     x 2  .  Missed ab     x 6 - 2 surgery, 4 resolved on it's own  . Seasonal allergies   . Arthritis     lower back  . 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 PONV (postoperative nausea and vomiting)     states she had scop patch in past,states she sets off alarms when she is waking up, she "forgets to breathe"     Past Surgical History  Procedure Laterality Date  . Hammer toe surgery  4/12    lt foot-2-3  . Appendectomy    . Metatarsal osteotomy  3/12    lt  . Thumb fusion  2008    rt  . Carpal tunnel release      both  . Knee arthroscopy  2007    both  . Orif toe fracture  11/04/2011    Procedure: OPEN REDUCTION INTERNAL FIXATION (ORIF) METATARSAL (TOE) FRACTURE;  Surgeon: Wylene Simmer, MD;  Location: [facility];  Service: Orthopedics;  Laterality: Left;  left revision orif 1st metatarsal with autograft from left calcaneous and left 2nd-3rd metatarsal weil osteotomy  . 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 Dilation and curettage of uterus      x2  . Total knee arthroplasty  08/29/2012    Procedure: TOTAL KNEE ARTHROPLASTY;  Surgeon: Hessie Dibble, MD;  Location: [facility];  Service: Orthopedics;  Laterality: Right;  . Other surgical history  05/08/13    hardware removed from left foot  . Bunionectomy  03/02/11 and 11/04/11    left foot x2  . Joint replacement      R- TKA- 2013  . Tubal ligation    . Total shoulder arthroplasty Right 05/09/2014    Procedure: TOTAL SHOULDER ARTHROPLASTY;  Surgeon: Nita Sells, MD;  Location: [facility];  Service: Orthopedics;  Laterality: Right;  Right shoulder arthroplasty  . Lumbar laminectomy  05/1999    L4-L5  . Back surgery  09,00,10,99, 6/14,9/14    x6 surgeries on back -Spinal Fusin L4-L5, Lumbar Fusion L1-S1  . Breast surgery      left cyst- benign  . Abdominal hysterectomy Bilateral 10/07/2014    Procedure: TOTAL ABDOMINAL HYSTERECTOMY WITH BILATERAL SALPINGO OOPHORECTOMY AND HALBANS CULDOELASTY ;   Surgeon: Lyman Speller, MD;  Location: [facility];  Service: Gynecology;  Laterality: Bilateral;  Miller primary/Silva assist  4 hours total  . Anterior and posterior repair N/A 10/07/2014    Procedure: ANTERIOR (CYSTOCELE) AND POSTERIOR REPAIR (RECTOCELE);  Surgeon: Lyman Speller, MD;  Location: [facility];  Service: Gynecology;  Laterality: N/A;  . Bladder suspension N/A 10/07/2014    Procedure: TRANSVAGINAL TAPE (TVT) EXACT PROCEDURE;  Surgeon: Lyman Speller, MD;  Location: [facility];  Service: Gynecology;  Laterality: N/A;  . Cystoscopy N/A 10/07/2014    Procedure: CYSTOSCOPY;  Surgeon: Lyman Speller, MD;  Location: [facility];  Service: Gynecology;  Laterality: N/A;    Current Outpatient Prescriptions  Medication Sig Dispense Refill  . amoxicillin-clavulanate (AUGMENTIN) 875-125 MG per tablet Take 1 tablet by mouth 2 (two) times daily. Take one tablet BID x 7D. 14 tablet 0  . Ascorbic Acid (VITAMIN C PO) Take 1,000 mg by mouth daily.     Marland Kitchen aspirin EC 81 MG tablet Take 81 mg by mouth daily.    . Biotin 5000 MCG CAPS Take 1 capsule by mouth daily.    . Calcium Carbonate-Vit D-Min (CALCIUM 1200 PO) Take 1,200 mg by mouth daily.     . diazepam (VALIUM) 5 MG tablet Take 5 mg by mouth every 8 (eight) hours as needed for muscle spasms (for back spasms after surgery).    Marland Kitchen EPINEPHrine (EPI-PEN) 0.3 mg/0.3 mL DEVI Inject 0.3 mg into the muscle once. For various allergic reactions.    . Flaxseed, Linseed, (FLAX SEED OIL PO) Take 2,000 mg by mouth daily.     . furosemide (LASIX) 40 MG tablet Take 40 mg by mouth daily.     Marland Kitchen gabapentin (NEURONTIN) 600 MG tablet Take 600 mg by mouth 3 (three) times daily.     Marland Kitchen HYDROmorphone (DILAUDID) 2 MG tablet Take 1 tablet (2 mg total) by mouth every 4 (four) hours as needed. 30 tablet 0  . levothyroxine (SYNTHROID, LEVOTHROID) 75 MCG tablet Take 75 mcg by mouth daily.     . methadone (DOLOPHINE) 5 MG tablet Take 5 mg by mouth 4 (four) times daily.    .  Multiple Vitamin (MULTIVITAMIN WITH MINERALS) TABS Take 1 tablet by mouth daily.    . Multiple Vitamins-Minerals (PRESERVISION/LUTEIN PO) Take 1 tablet by mouth 2 (two) times daily.    Marland Kitchen  pravastatin (PRAVACHOL) 20 MG tablet Take 20 mg by mouth at bedtime.     . senna (SENOKOT) 8.6 MG tablet Take 2-3 tablets by mouth 2 (two) times daily. 2 tablets in the morning, 3 tablets in the evening    . carvedilol (COREG) 6.25 MG tablet     . nystatin-triamcinolone ointment (MYCOLOG) as needed.     No current facility-administered medications for this visit.     ALLERGIES: Flexeril; Red dye; Ciprofloxacin; Other; Tylenol; Eggs or egg-derived products; Latex; Lisinopril; Methocarbamol; Morphine and related; and Sulfa antibiotics  Family History  Problem Relation Age of Onset  . Hyperlipidemia Father   . Hypertension Father   . Heart attack Father 94    deceased    History   Social History  . Marital Status: Married    Spouse Name: N/A    Number of Children: N/A  . Years of Education: N/A   Occupational History  . Not on file.   Social History Main Topics  . Smoking status: Never Smoker   . Smokeless tobacco: Never Used     Comment: occ wine  . Alcohol Use: 0.5 oz/week    1 drink(s) per week     Comment: glass of wine   . Drug Use: No  . Sexual Activity:    Partners: Male    Birth Control/ Protection: Post-menopausal   Other Topics Concern  . Not on file   Social History Narrative    ROS:  Pertinent items are noted in HPI.  PHYSICAL EXAMINATION:    BP 154/64 mmHg  Pulse 90  Resp 12  Ht 5' 3.25" (1.607 m)  Wt 194 lb (87.998 kg)  BMI 34.08 kg/m2  LMP 12/06/2000    Temp 99.1 F.  General appearance: alert, cooperative and appears stated age  Abdomen: Pfannenstiel incision intact and without erythema, minimal ecchymoses, soft, non-tender; no masses,  no organomegaly No abnormal inguinal nodes palpated  Pelvic: External genitalia:   Ecchymoses of the vulva without  induration.  Incisions intact.               Urethra:  normal appearing urethra with no masses, tenderness or lesions              Bartholins and Skenes: normal                 Bimanual exam:  No masses.  Sutures intact.  No agglutination.  Good support.  ASSESSMENT  Status post total abdominal hysterectomy with bilateral salpingo-oophorectomy, Halban's culdoplasty, anterior and posterior colporrhaphy with cystoscopy. Cellulitis resolved.  Diarrhea while on Augmentin.  Anemia stable status post transfusion of 2 units packed RBCs.  PLAN  Discontinue Augmentin.  Return for a wound recheck in 1 week.  Continue ambulation but no lifting over 10 pounds for 12 weeks post op.  Reviewed again the reason for not proceeding with the sacrocolpopexy at the time of surgery due to her anatomy and the concern for potential complication.  Indicates understanding.    An After Visit Summary was printed and given to the patient.  ___35___ minutes face to face time of which over 50% was spent in counseling.

## 2014-10-15 ENCOUNTER — Other Ambulatory Visit: Payer: Self-pay | Admitting: Obstetrics & Gynecology

## 2014-10-15 DIAGNOSIS — D649 Anemia, unspecified: Secondary | ICD-10-CM | POA: Diagnosis not present

## 2014-10-15 LAB — HEMOGLOBIN, FINGERSTICK: Hemoglobin, fingerstick: 10.8 g/dL — ABNORMAL LOW (ref 12.0–16.0)

## 2014-10-15 NOTE — Telephone Encounter (Signed)
Patient calling to request refills on: HYDROmorphone (DILAUDID) 2 MG tablet  Take 1 tablet (2 mg total) by mouth every 4 (four) hours as needed., Starting 09/13/2014, Until Discontinued, Print, Last Dose: Not Recorded  Refills: 0 ordered Pharmacy: [facility], [place].

## 2014-10-16 MED ORDER — HYDROMORPHONE HCL 2 MG PO TABS: 2.0000 mg | ORAL_TABLET | ORAL | Status: DC | PRN

## 2014-10-16 NOTE — Telephone Encounter (Signed)
Pt informed that rx will be in front office for pick up Encounter closed

## 2014-10-16 NOTE — Telephone Encounter (Signed)
Incoming Refill Request from pt BO:[id number] 2mg   Last AEX:08/16/14 Last Refill:09/13/14 #30 X 0 Last Office Visit: 10/14/14 Next Office Visit: 10/21/14 (Dr. Quincy Simmonds)  Please Advise

## 2014-10-17 ENCOUNTER — Encounter: Payer: Self-pay | Admitting: Obstetrics and Gynecology

## 2014-10-19 ENCOUNTER — Telehealth: Payer: Self-pay | Admitting: Gynecology

## 2014-10-19 NOTE — Telephone Encounter (Signed)
On Call Note 11:50 AM:  12 days postop with bilateral lower leg rash described as small red papules. No itching, more diffuse rash or respiratory symptoms.  Otherwise feels well without fevers, urinary or GI complaints.  Incisions seem fine.  Using dilaudid for pain.  Recommend benadryl, hydrocortisone OTC cream, stop dilaudid and use motrin and oxycodone she already has at home from another surgery.  Has postop appt already scheduled Monday.  Call back if symptoms worsen or change.

## 2014-10-21 ENCOUNTER — Telehealth: Payer: Self-pay | Admitting: *Deleted

## 2014-10-21 ENCOUNTER — Encounter: Payer: Self-pay | Admitting: Obstetrics and Gynecology

## 2014-10-21 ENCOUNTER — Ambulatory Visit (INDEPENDENT_AMBULATORY_CARE_PROVIDER_SITE_OTHER): Payer: Medicare Other | Admitting: Obstetrics and Gynecology

## 2014-10-21 ENCOUNTER — Ambulatory Visit: Payer: Medicare Other | Admitting: Obstetrics & Gynecology

## 2014-10-21 VITALS — BP 120/64 | HR 76 | Ht 63.25 in | Wt 189.2 lb

## 2014-10-21 DIAGNOSIS — R233 Spontaneous ecchymoses: Secondary | ICD-10-CM

## 2014-10-21 DIAGNOSIS — R5383 Other fatigue: Secondary | ICD-10-CM

## 2014-10-21 LAB — CBC
HEMATOCRIT: 31.9 % — AB (ref 36.0–46.0)
HEMOGLOBIN: 11 g/dL — AB (ref 12.0–15.0)
MCH: 30.4 pg (ref 26.0–34.0)
MCHC: 34.5 g/dL (ref 30.0–36.0)
MCV: 88.1 fL (ref 78.0–100.0)
MPV: 9.4 fL (ref 9.4–12.4)
Platelets: 396 10*3/uL (ref 150–400)
RBC: 3.62 MIL/uL — ABNORMAL LOW (ref 3.87–5.11)
RDW: 16 % — AB (ref 11.5–15.5)
WBC: 7.5 10*3/uL (ref 4.0–10.5)

## 2014-10-21 LAB — COMPREHENSIVE METABOLIC PANEL
ALBUMIN: 3.7 g/dL (ref 3.5–5.2)
ALK PHOS: 99 U/L (ref 39–117)
ALT: 13 U/L (ref 0–35)
AST: 22 U/L (ref 0–37)
BUN: 13 mg/dL (ref 6–23)
CALCIUM: 8.9 mg/dL (ref 8.4–10.5)
CO2: 29 mEq/L (ref 19–32)
Chloride: 102 mEq/L (ref 96–112)
Creat: 0.82 mg/dL (ref 0.50–1.10)
Glucose, Bld: 94 mg/dL (ref 70–99)
Potassium: 4.5 mEq/L (ref 3.5–5.3)
SODIUM: 140 meq/L (ref 135–145)
TOTAL PROTEIN: 6.9 g/dL (ref 6.0–8.3)
Total Bilirubin: 0.7 mg/dL (ref 0.2–1.2)

## 2014-10-21 NOTE — Progress Notes (Signed)
Patient ID: [name] [name], female   DOB: 02/20/1941, 73 y.o.   MRN: [id number] GYNECOLOGY  VISIT   HPI: 73 y.o.   Married  Caucasian  female   G57P2 with Patient's last menstrual period was 12/06/2000.   here for 2 week post op.  Had possible allergic reaction to Dilaudid--rash on legs.  No rash anywhere else.  No mucous membrane sores.  Called the on-call physician, Dr. Phineas Real, and was told to stop the Dilaudid.  Has been taking Benadryl and rash is much better. No itching or pain, but dramatic hemorrhagic rash on her legs.  Stopped the Augmentin 4 days prior.   Voiding well.  First morning void is more difficult if does not get up to empty her bladder once during night.  Bowel function if good.   Some fatigue.   No new exposures.  No insect bites.  Hgb 11.5 on 09/07/14.   GYNECOLOGIC HISTORY: Patient's last menstrual period was 12/06/2000. Contraception: postmenopausal  Menopausal hormone therapy: n/a        OB History    Gravida Para Term Preterm AB TAB SAB Ectopic Multiple Living   9 2        2          Patient Active Problem List   Diagnosis Date Noted  . S/P TAH (total abdominal hysterectomy) 10/08/2014  . Pelvic prolapse 10/07/2014  . Cystocele 08/16/2014  . Glenohumeral arthritis 05/09/2014  . 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 Hypertension   . Hypothyroidism   . GERD (gastroesophageal reflux disease)   . Chronic pain     lower back  . Bronchitis     history only  . Ulcer     hx  . Virus not detected 2010    postop, caught virus in hosp., respiratory symptoms, upon d/c to home, had allergic reaction /w plate size hives - not sure of the cause    . Sinus infection     recent sinus infection, finished Zpak- 08/16/2013  . Female bladder prolapse   . Anemia     took iron for a while  . SVD (spontaneous vaginal delivery)     x 2  . Missed ab     x 6 - 2 surgery, 4 resolved on it's own  . Seasonal  allergies   . Arthritis     lower back  . 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 PONV (postoperative nausea and vomiting)     states she had scop patch in past,states she sets off alarms when she is waking up, she "forgets to breathe"     Past Surgical History  Procedure Laterality Date  . Hammer toe surgery  4/12    lt foot-2-3  . Appendectomy    . Metatarsal osteotomy  3/12    lt  . Thumb fusion  2008    rt  . Carpal tunnel release      both  . Knee arthroscopy  2007    both  . Orif toe fracture  11/04/2011    Procedure: OPEN REDUCTION INTERNAL FIXATION (ORIF) METATARSAL (TOE) FRACTURE;  Surgeon: Wylene Simmer, MD;  Location: [facility];  Service: Orthopedics;  Laterality: Left;  left revision orif 1st metatarsal with autograft from left calcaneous and left 2nd-3rd metatarsal weil osteotomy  . 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  Dilation and curettage of uterus      x2  . Total knee arthroplasty  08/29/2012    Procedure: TOTAL KNEE ARTHROPLASTY;  Surgeon: Hessie Dibble, MD;  Location: [facility];  Service: Orthopedics;  Laterality: Right;  . Other surgical history  05/08/13    hardware removed from left foot  . Bunionectomy  03/02/11 and 11/04/11    left foot x2  . Joint replacement      R- TKA- 2013  . Tubal ligation    . Total shoulder arthroplasty Right 05/09/2014    Procedure: TOTAL SHOULDER ARTHROPLASTY;  Surgeon: Nita Sells, MD;  Location: [facility];  Service: Orthopedics;  Laterality: Right;  Right shoulder arthroplasty  . Lumbar laminectomy  05/1999    L4-L5  . Back surgery  09,00,10,99, 6/14,9/14    x6 surgeries on back -Spinal Fusin L4-L5, Lumbar Fusion L1-S1  . Breast surgery      left cyst- benign  . Abdominal hysterectomy Bilateral 10/07/2014    Procedure: TOTAL ABDOMINAL HYSTERECTOMY WITH BILATERAL SALPINGO OOPHORECTOMY AND HALBANS CULDOELASTY ;  Surgeon: Lyman Speller, MD;  Location: [facility];  Service:  Gynecology;  Laterality: Bilateral;  Miller primary/Silva assist  4 hours total  . Anterior and posterior repair N/A 10/07/2014    Procedure: ANTERIOR (CYSTOCELE) AND POSTERIOR REPAIR (RECTOCELE);  Surgeon: Lyman Speller, MD;  Location: [facility];  Service: Gynecology;  Laterality: N/A;  . Bladder suspension N/A 10/07/2014    Procedure: TRANSVAGINAL TAPE (TVT) EXACT PROCEDURE;  Surgeon: Lyman Speller, MD;  Location: [facility];  Service: Gynecology;  Laterality: N/A;  . Cystoscopy N/A 10/07/2014    Procedure: CYSTOSCOPY;  Surgeon: Lyman Speller, MD;  Location: [facility];  Service: Gynecology;  Laterality: N/A;    Current Outpatient Prescriptions  Medication Sig Dispense Refill  . amoxicillin-clavulanate (AUGMENTIN) 875-125 MG per tablet Take 1 tablet by mouth 2 (two) times daily. Take one tablet BID x 7D. 14 tablet 0  . Ascorbic Acid (VITAMIN C PO) Take 1,000 mg by mouth daily.     Marland Kitchen aspirin EC 81 MG tablet Take 81 mg by mouth daily.    . Biotin 5000 MCG CAPS Take 1 capsule by mouth daily.    . Calcium Carbonate-Vit D-Min (CALCIUM 1200 PO) Take 1,200 mg by mouth daily.     . carvedilol (COREG) 6.25 MG tablet     . diazepam (VALIUM) 5 MG tablet Take 5 mg by mouth every 8 (eight) hours as needed for muscle spasms (for back spasms after surgery).    Marland Kitchen EPINEPHrine (EPI-PEN) 0.3 mg/0.3 mL DEVI Inject 0.3 mg into the muscle once. For various allergic reactions.    . Flaxseed, Linseed, (FLAX SEED OIL PO) Take 2,000 mg by mouth daily.     . furosemide (LASIX) 40 MG tablet Take 40 mg by mouth daily.     Marland Kitchen gabapentin (NEURONTIN) 600 MG tablet Take 600 mg by mouth 3 (three) times daily.     Marland Kitchen HYDROmorphone (DILAUDID) 2 MG tablet Take 1 tablet (2 mg total) by mouth every 4 (four) hours as needed. 30 tablet 0  . levothyroxine (SYNTHROID, LEVOTHROID) 75 MCG tablet Take 75 mcg by mouth daily.     . methadone (DOLOPHINE) 5 MG tablet Take 5 mg by mouth 4 (four) times daily.    . Multiple Vitamin  (MULTIVITAMIN WITH MINERALS) TABS Take 1 tablet by mouth daily.    . Multiple Vitamins-Minerals (PRESERVISION/LUTEIN PO) Take 1 tablet by mouth 2 (two) times  daily.    . nystatin-triamcinolone ointment (MYCOLOG) as needed.    . pravastatin (PRAVACHOL) 20 MG tablet Take 20 mg by mouth at bedtime.     . senna (SENOKOT) 8.6 MG tablet Take 2-3 tablets by mouth 2 (two) times daily. 2 tablets in the morning, 3 tablets in the evening     No current facility-administered medications for this visit.     ALLERGIES: Flexeril; Red dye; Ciprofloxacin; Other; Tylenol; Eggs or egg-derived products; Latex; Lisinopril; Methocarbamol; Morphine and related; and Sulfa antibiotics  Family History  Problem Relation Age of Onset  . Hyperlipidemia Father   . Hypertension Father   . Heart attack Father 73    deceased    History   Social History  . Marital Status: Married    Spouse Name: N/A    Number of Children: N/A  . Years of Education: N/A   Occupational History  . Not on file.   Social History Main Topics  . Smoking status: Never Smoker   . Smokeless tobacco: Never Used     Comment: occ wine  . Alcohol Use: 0.6 oz/week    1 Not specified per week     Comment: glass of wine   . Drug Use: No  . Sexual Activity:    Partners: Male    Birth Control/ Protection: Post-menopausal   Other Topics Concern  . Not on file   Social History Narrative    ROS:  Pertinent items are noted in HPI.  PHYSICAL EXAMINATION:    BP 120/64 mmHg  Pulse 76  Ht 5' 3.25" (1.607 m)  Wt 189 lb 3.2 oz (85.821 kg)  BMI 33.23 kg/m2  LMP 12/06/2000     General appearance: alert, cooperative and appears stated age.  NAD. Abdomen:  Pfannenstiel incision intact  - some induration along upper border, no ecchymoses or erythema of the skin, soft, non-tender; no masses,  no organomegaly Lower extremities:  Flat etechial coalescing rash of lower extremities.  No skin breakdown.    Pelvic: External genitalia:  no  lesions              Urethra:  normal appearing urethra with no masses, tenderness or lesions              Bartholins and Skenes: normal                 Vagina:  Sutures intact, no lesions              Cervix: absent                   Bimanual Exam:  Uterus:   absent                                      Adnexa: no masses                                  ASSESSMENT  Doing well post op.  Petechial rash.  Etiology?  Fatigue.   PLAN   No more Dilaudid.  CBC and CMP now.  Will have patient see her dermatologist - Dr. Danella Sensing.  We will make an appointment for her.  Follow up next week.   An After Visit Summary was printed and given to the patient.  __20____ minutes face to face time of  which over 50% was spent in counseling.

## 2014-10-21 NOTE — Telephone Encounter (Signed)
Thank you for the update.  Dr. Sabra Heck will see the patient for a recheck next week.   Cc- Dr. Sabra Heck.

## 2014-10-21 NOTE — Telephone Encounter (Signed)
Per Quincy Simmonds request, call to Dr Wilhemina Bonito office requesting OV for tomorrow for evaluation of hemorrhagic rash of lower extremities. Janett Billow calls back to say that Dr Pablo Ledger can see patient tomorrow at 340pm.

## 2014-10-21 NOTE — Telephone Encounter (Signed)
Patient notified of appointment tomorrow at 340pm.  Routing to provider for final review. Patient agreeable to disposition. Will close encounter

## 2014-10-22 DIAGNOSIS — D692 Other nonthrombocytopenic purpura: Secondary | ICD-10-CM | POA: Diagnosis not present

## 2014-10-23 DIAGNOSIS — Z96611 Presence of right artificial shoulder joint: Secondary | ICD-10-CM | POA: Diagnosis not present

## 2014-10-23 DIAGNOSIS — Z471 Aftercare following joint replacement surgery: Secondary | ICD-10-CM | POA: Diagnosis not present

## 2014-10-25 DIAGNOSIS — Z6831 Body mass index (BMI) 31.0-31.9, adult: Secondary | ICD-10-CM | POA: Diagnosis not present

## 2014-10-25 DIAGNOSIS — M545 Low back pain: Secondary | ICD-10-CM | POA: Diagnosis not present

## 2014-10-28 ENCOUNTER — Encounter: Payer: Self-pay | Admitting: Certified Nurse Midwife

## 2014-10-28 ENCOUNTER — Ambulatory Visit (INDEPENDENT_AMBULATORY_CARE_PROVIDER_SITE_OTHER): Payer: Medicare Other | Admitting: Obstetrics & Gynecology

## 2014-10-28 ENCOUNTER — Ambulatory Visit (INDEPENDENT_AMBULATORY_CARE_PROVIDER_SITE_OTHER): Payer: Medicare Other | Admitting: Certified Nurse Midwife

## 2014-10-28 VITALS — BP 134/70 | HR 72 | Temp 99.0°F | Ht 63.25 in | Wt 189.0 lb

## 2014-10-28 DIAGNOSIS — Z9889 Other specified postprocedural states: Secondary | ICD-10-CM

## 2014-10-28 DIAGNOSIS — R312 Other microscopic hematuria: Secondary | ICD-10-CM | POA: Diagnosis not present

## 2014-10-28 DIAGNOSIS — R350 Frequency of micturition: Secondary | ICD-10-CM | POA: Diagnosis not present

## 2014-10-28 LAB — CBC WITH DIFFERENTIAL/PLATELET
BASOS ABS: 0.1 10*3/uL (ref 0.0–0.1)
BASOS PCT: 1 % (ref 0–1)
EOS PCT: 7 % — AB (ref 0–5)
Eosinophils Absolute: 0.4 10*3/uL (ref 0.0–0.7)
HCT: 34.1 % — ABNORMAL LOW (ref 36.0–46.0)
Hemoglobin: 11.4 g/dL — ABNORMAL LOW (ref 12.0–15.0)
Lymphocytes Relative: 32 % (ref 12–46)
Lymphs Abs: 1.8 10*3/uL (ref 0.7–4.0)
MCH: 30.4 pg (ref 26.0–34.0)
MCHC: 33.4 g/dL (ref 30.0–36.0)
MCV: 90.9 fL (ref 78.0–100.0)
MPV: 9.6 fL (ref 9.4–12.4)
Monocytes Absolute: 0.6 10*3/uL (ref 0.1–1.0)
Monocytes Relative: 10 % (ref 3–12)
NEUTROS ABS: 2.8 10*3/uL (ref 1.7–7.7)
Neutrophils Relative %: 50 % (ref 43–77)
PLATELETS: 298 10*3/uL (ref 150–400)
RBC: 3.75 MIL/uL — ABNORMAL LOW (ref 3.87–5.11)
RDW: 16.2 % — AB (ref 11.5–15.5)
WBC: 5.5 10*3/uL (ref 4.0–10.5)

## 2014-10-28 LAB — HEMOGLOBIN, FINGERSTICK: Hemoglobin, fingerstick: 11.4 g/dL — ABNORMAL LOW (ref 12.0–16.0)

## 2014-10-28 NOTE — Progress Notes (Signed)
73 y.o. married white female g9p2 here with complaint of vaginal bleeding noted last pm during the night when she had gotten up to urinate. Patient had episode of spontaneous urinary leakage, in the last 24 hours at church. Patient describes bleeding as small amount 24 hours ago, which did not progress just light pink to red, did not increase until during the night which she passed 2-3 inch clot and small ones with bright red bleeding. Pad now has small amount of red bleeding. Denies pain except cramping( which she is aware is normal, fever or chills. "feels flu achy". Having increase cramping in past few hours. Patient denies urinary frequency or pain. Patient is 3 week post op surgical for TAH with BSO and culdoplasty and anterior and posterior colporrhaphy with cystoscopy. Spouse with patient. Patient has had very little po fluid this am.    O:Healthy female WDWN in no apparent distress Affect: normal, orientation x 3  Exam: Skin warm and dry Abdomen: Soft, non tender except over incision area. Incision clean and dry, no discoloration Lymph node: no enlargement or tenderness Pelvic exam: External genital: Normal appearance BUS: negative Bladder  Non tender, urethral meatus slightly red Vagina: No active bleeding noted, with Q-tip touch to left side, slight pink blood noted from incision area only. Gentle palpation, no blood noted on glove. Vaginal exam non tender. Cervix absent Uterus: absent Adnexa:absent   A: 3 week Post Op TAH with BSO with culdoplasty, anterior and posterior colporrhaphy with cystoscopy Afebrile Small amount of bleeding noted from suture line of vaginal repair. No active bleeding.  Hgb. 11.4 R/O UTI   P:Discussed findings of probable source of bleeding and no concerns at this point. Questions answered. Discussed obtain urine specimen to R/O UTI and do CBC with diff. Agreeable. Discussed Dr. Sabra Heck is aware of status and will advise.  PPJ:KDTOI culture, CBC with  Diff Discussed importance of increase fluid intake. Will advise if any change in status. Rv prn

## 2014-10-28 NOTE — Patient Instructions (Signed)

## 2014-10-29 ENCOUNTER — Telehealth: Payer: Self-pay

## 2014-10-29 LAB — URINALYSIS, MICROSCOPIC ONLY
Bacteria, UA: NONE SEEN
Casts: NONE SEEN
Crystals: NONE SEEN
Squamous Epithelial / LPF: NONE SEEN

## 2014-10-29 NOTE — Telephone Encounter (Signed)
Appt rescheduled to 11/30 @ 2:00

## 2014-10-30 DIAGNOSIS — Z96651 Presence of right artificial knee joint: Secondary | ICD-10-CM | POA: Diagnosis not present

## 2014-10-30 DIAGNOSIS — M1712 Unilateral primary osteoarthritis, left knee: Secondary | ICD-10-CM | POA: Diagnosis not present

## 2014-10-30 LAB — URINE CULTURE
Colony Count: NO GROWTH
Organism ID, Bacteria: NO GROWTH

## 2014-11-02 ENCOUNTER — Encounter: Payer: Self-pay | Admitting: Obstetrics & Gynecology

## 2014-11-02 NOTE — Progress Notes (Signed)
Notation of visit documented on D. Leonard's office visit of same day.

## 2014-11-02 NOTE — Progress Notes (Signed)
Pt seen and examined personally.  T: 99.0 today.  Abdominal incision without erythema.  Firmness along incision is completely appropriate for stage of healing.  Skin looks great.    Vaginal exam is without abnormal findings.  Scant old blood/non malodorous discharge present but again appropriate.  No masses.  No erythema.  Suture lines intact.  Due to continue RBCs on dip u/a, cath U/A performed under sterile conditions.    LE rash completely resolved.  Urine culture is pending.  For now, pt will restart Augmentin.  Knows to call if anything changes in regards to prior vascular rash.   Reviewed personally.  Felipa Emory, MD.

## 2014-11-04 ENCOUNTER — Ambulatory Visit (INDEPENDENT_AMBULATORY_CARE_PROVIDER_SITE_OTHER): Payer: Medicare Other | Admitting: Obstetrics & Gynecology

## 2014-11-04 VITALS — BP 126/58 | HR 64 | Temp 98.8°F | Resp 16 | Wt 191.6 lb

## 2014-11-04 DIAGNOSIS — Z9889 Other specified postprocedural states: Secondary | ICD-10-CM

## 2014-11-04 DIAGNOSIS — N3941 Urge incontinence: Secondary | ICD-10-CM

## 2014-11-04 MED ORDER — FESOTERODINE FUMARATE ER 4 MG PO TB24: 4.0000 mg | ORAL_TABLET | Freq: Every day | ORAL | Status: DC

## 2014-11-04 NOTE — Progress Notes (Signed)
Post Operative Visit  Procedure: Total Abdominal Hysterectomy with BSO, Cystocele/Rectocele Days Post-op: 29  Subjective: Pt continues to have a little spotting/discharge.  We discussed at last visit this is perfectly appropriate and she will have this on and along until the sutures fully resolve--4-6 weeks.  Urine culture was negative.  Pt was back on Augmentin for two days but was advised to stop.  She continues to report spontaneous leakage of urine.  Will feel the urge to urinate and then just leaks.  Can then get to the bathroom and empty.  Does feel like she is emptying completely.  This has happened maybe 5-6 times in the last week..  Complaints of some lower back pain/ache and a little irritation along her buttocks.  No numbness.    Objective: BP 126/58 mmHg  Pulse 64  Temp(Src) 98.8 F (37.1 C) (Oral)  Resp 16  Wt 191 lb 9.6 oz (86.909 kg)  LMP 12/06/2000  EXAM General: alert and cooperative Resp: clear to auscultation bilaterally Cardio: regular rate and rhythm, S1, S2 normal, no murmur, click, rub or gallop GI: soft, non-tender; bowel sounds normal; no masses,  no organomegaly and incision: clean, dry and intact Extremities: extremities normal, atraumatic, no cyanosis or edema , no evidence of rash Vaginal Bleeding: minimal and with appropriate discharge.  Suture line intact.  Assessment: s/p TAH/BSO, anterior/posterior colporrhaphy, TVT Now with spontaneous urinary leakage, does not appear to be overflow incontinence  Plan: Recheck 1 weeks Will start Toviaz 4mg .  Side effects of constipation, dry eyes, dry mouth discussed.  Pt does not have hx of glaucoma.  Pt knows to let me know if has worsening issues with constipation.

## 2014-11-05 ENCOUNTER — Ambulatory Visit: Payer: Medicare Other | Admitting: Obstetrics & Gynecology

## 2014-11-06 ENCOUNTER — Encounter: Payer: Self-pay | Admitting: Obstetrics & Gynecology

## 2014-11-06 DIAGNOSIS — M961 Postlaminectomy syndrome, not elsewhere classified: Secondary | ICD-10-CM | POA: Diagnosis not present

## 2014-11-06 DIAGNOSIS — Z79891 Long term (current) use of opiate analgesic: Secondary | ICD-10-CM | POA: Diagnosis not present

## 2014-11-06 DIAGNOSIS — G894 Chronic pain syndrome: Secondary | ICD-10-CM | POA: Diagnosis not present

## 2014-11-15 DIAGNOSIS — I10 Essential (primary) hypertension: Secondary | ICD-10-CM | POA: Diagnosis not present

## 2014-11-15 DIAGNOSIS — N390 Urinary tract infection, site not specified: Secondary | ICD-10-CM | POA: Diagnosis not present

## 2014-11-15 DIAGNOSIS — E785 Hyperlipidemia, unspecified: Secondary | ICD-10-CM | POA: Diagnosis not present

## 2014-11-15 DIAGNOSIS — Z0000 Encounter for general adult medical examination without abnormal findings: Secondary | ICD-10-CM | POA: Diagnosis not present

## 2014-11-15 DIAGNOSIS — E039 Hypothyroidism, unspecified: Secondary | ICD-10-CM | POA: Diagnosis not present

## 2014-11-21 ENCOUNTER — Encounter: Payer: Self-pay | Admitting: Internal Medicine

## 2014-11-21 ENCOUNTER — Other Ambulatory Visit: Payer: Self-pay | Admitting: Dermatology

## 2014-11-21 DIAGNOSIS — D485 Neoplasm of uncertain behavior of skin: Secondary | ICD-10-CM | POA: Diagnosis not present

## 2014-11-21 DIAGNOSIS — D649 Anemia, unspecified: Secondary | ICD-10-CM | POA: Diagnosis not present

## 2014-11-21 DIAGNOSIS — G4733 Obstructive sleep apnea (adult) (pediatric): Secondary | ICD-10-CM | POA: Diagnosis not present

## 2014-11-21 DIAGNOSIS — N941 Dyspareunia: Secondary | ICD-10-CM | POA: Diagnosis not present

## 2014-11-21 DIAGNOSIS — I10 Essential (primary) hypertension: Secondary | ICD-10-CM | POA: Diagnosis not present

## 2014-11-21 DIAGNOSIS — E039 Hypothyroidism, unspecified: Secondary | ICD-10-CM | POA: Diagnosis not present

## 2014-11-21 DIAGNOSIS — Z0000 Encounter for general adult medical examination without abnormal findings: Secondary | ICD-10-CM | POA: Diagnosis not present

## 2014-11-21 DIAGNOSIS — E669 Obesity, unspecified: Secondary | ICD-10-CM | POA: Diagnosis not present

## 2014-11-21 DIAGNOSIS — Z1212 Encounter for screening for malignant neoplasm of rectum: Secondary | ICD-10-CM | POA: Diagnosis not present

## 2014-11-21 DIAGNOSIS — L814 Other melanin hyperpigmentation: Secondary | ICD-10-CM | POA: Diagnosis not present

## 2014-11-21 DIAGNOSIS — Z1389 Encounter for screening for other disorder: Secondary | ICD-10-CM | POA: Diagnosis not present

## 2014-11-21 DIAGNOSIS — D229 Melanocytic nevi, unspecified: Secondary | ICD-10-CM | POA: Diagnosis not present

## 2014-11-27 ENCOUNTER — Encounter: Payer: Self-pay | Admitting: Obstetrics and Gynecology

## 2014-11-27 ENCOUNTER — Ambulatory Visit (INDEPENDENT_AMBULATORY_CARE_PROVIDER_SITE_OTHER): Payer: Medicare Other | Admitting: Obstetrics and Gynecology

## 2014-11-27 VITALS — BP 108/60 | HR 64 | Resp 18 | Ht 63.25 in | Wt 193.0 lb

## 2014-11-27 DIAGNOSIS — R300 Dysuria: Secondary | ICD-10-CM | POA: Diagnosis not present

## 2014-11-27 DIAGNOSIS — Z9889 Other specified postprocedural states: Secondary | ICD-10-CM

## 2014-11-27 LAB — POCT URINALYSIS DIPSTICK
Bilirubin, UA: NEGATIVE
Blood, UA: NEGATIVE
GLUCOSE UA: NEGATIVE
KETONES UA: NEGATIVE
LEUKOCYTES UA: NEGATIVE
Nitrite, UA: NEGATIVE
Protein, UA: NEGATIVE
UROBILINOGEN UA: NEGATIVE
pH, UA: 5

## 2014-11-27 NOTE — Patient Instructions (Signed)
Continue to have decreased activity for the next month.

## 2014-11-27 NOTE — Progress Notes (Signed)
GYNECOLOGY  VISIT   HPI: 73 y.o.   Married  Caucasian  female   G87P2 with Patient's last menstrual period was 12/06/2000.   here for Follow up Post Op.  Surgery done 10/07/14:  Status post total abdominal hysterectomy with bilateral salpingo-oophorectomy, Halban's culdoplasty, anterior and posterior colporrhaphy, TVT Exact midurethral sling with cystoscopy. Status post transfusion of 2 units of packed red blood cells for post op anemia attributed to an underestimate of EBL from surgery.   Patient asking to review surgical procedure again.   Took Toviaz 4 mg daily for only 2 days.  Had dry mouth.  No more flooding of urine.  Some times has pain with urination.  Feels more crampy.   Negative urine culture 10/28/14. Occasionally urine foamy.     No vaginal bleeding.  Little constipation.   Some low back ache and menstrual like cramping.   No further rash on legs.   GYNECOLOGIC HISTORY: Patient's last menstrual period was 12/06/2000. Contraception:  Hysterectomy  Menopausal hormone therapy: none        OB History    Gravida Para Term Preterm AB TAB SAB Ectopic Multiple Living   9 2        2          Patient Active Problem List   Diagnosis Date Noted  . S/P TAH (total abdominal hysterectomy) 10/08/2014  . Pelvic prolapse 10/07/2014  . Cystocele 08/16/2014  . Glenohumeral arthritis 05/09/2014  . 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 Hypertension   . Hypothyroidism   . GERD (gastroesophageal reflux disease)   . Chronic pain     lower back  . Bronchitis     history only  . Ulcer     hx  . Virus not detected 2010    postop, caught virus in hosp., respiratory symptoms, upon d/c to home, had allergic reaction /w plate size hives - not sure of the cause    . Sinus infection     recent sinus infection, finished Zpak- 08/16/2013  . Female bladder prolapse   . Anemia     took iron for a while  . SVD (spontaneous vaginal  delivery)     x 2  . Missed ab     x 6 - 2 surgery, 4 resolved on it's own  . Seasonal allergies   . Arthritis     lower back  . 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 PONV (postoperative nausea and vomiting)     states she had scop patch in past,states she sets off alarms when she is waking up, she "forgets to breathe"     Past Surgical History  Procedure Laterality Date  . Hammer toe surgery  4/12    lt foot-2-3  . Appendectomy    . Metatarsal osteotomy  3/12    lt  . Thumb fusion  2008    rt  . Carpal tunnel release      both  . Knee arthroscopy  2007    both  . Orif toe fracture  11/04/2011    Procedure: OPEN REDUCTION INTERNAL FIXATION (ORIF) METATARSAL (TOE) FRACTURE;  Surgeon: Wylene Simmer, MD;  Location: [facility];  Service: Orthopedics;  Laterality: Left;  left revision orif 1st metatarsal with autograft from left calcaneous and left 2nd-3rd metatarsal weil osteotomy  . 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 Dilation and curettage of uterus      x2  . Total knee arthroplasty  08/29/2012    Procedure: TOTAL KNEE ARTHROPLASTY;  Surgeon: Hessie Dibble, MD;  Location: [facility];  Service: Orthopedics;  Laterality: Right;  . Other surgical history  05/08/13    hardware removed from left foot  . Bunionectomy  03/02/11 and 11/04/11    left foot x2  . Joint replacement      R- TKA- 2013  . Tubal ligation    . Total shoulder arthroplasty Right 05/09/2014    Procedure: TOTAL SHOULDER ARTHROPLASTY;  Surgeon: Nita Sells, MD;  Location: [facility];  Service: Orthopedics;  Laterality: Right;  Right shoulder arthroplasty  . Lumbar laminectomy  05/1999    L4-L5  . Back surgery  09,00,10,99, 6/14,9/14    x6 surgeries on back -Spinal Fusin L4-L5, Lumbar Fusion L1-S1  . Breast surgery      left cyst- benign  . Abdominal hysterectomy Bilateral 10/07/2014    Procedure: TOTAL ABDOMINAL HYSTERECTOMY WITH BILATERAL SALPINGO OOPHORECTOMY AND  HALBANS CULDOELASTY ;  Surgeon: Lyman Speller, MD;  Location: [facility];  Service: Gynecology;  Laterality: Bilateral;  Miller primary/Silva assist  4 hours total  . Anterior and posterior repair N/A 10/07/2014    Procedure: ANTERIOR (CYSTOCELE) AND POSTERIOR REPAIR (RECTOCELE);  Surgeon: Lyman Speller, MD;  Location: [facility];  Service: Gynecology;  Laterality: N/A;  . Bladder suspension N/A 10/07/2014    Procedure: TRANSVAGINAL TAPE (TVT) EXACT PROCEDURE;  Surgeon: Lyman Speller, MD;  Location: [facility];  Service: Gynecology;  Laterality: N/A;  . Cystoscopy N/A 10/07/2014    Procedure: CYSTOSCOPY;  Surgeon: Lyman Speller, MD;  Location: [facility];  Service: Gynecology;  Laterality: N/A;    Current Outpatient Prescriptions  Medication Sig Dispense Refill  . Ascorbic Acid (VITAMIN C PO) Take 1,000 mg by mouth daily.     Marland Kitchen aspirin EC 81 MG tablet Take 81 mg by mouth daily.    . Biotin 5000 MCG CAPS Take 1 capsule by mouth daily.    . Calcium Carbonate-Vit D-Min (CALCIUM 1200 PO) Take 1,200 mg by mouth daily.     . carvedilol (COREG) 6.25 MG tablet     . diazepam (VALIUM) 5 MG tablet Take 5 mg by mouth every 8 (eight) hours as needed for muscle spasms (for back spasms after surgery).    . Flaxseed, Linseed, (FLAX SEED OIL PO) Take 2,000 mg by mouth daily.     . furosemide (LASIX) 40 MG tablet Take 40 mg by mouth daily.     Marland Kitchen gabapentin (NEURONTIN) 600 MG tablet Take 600 mg by mouth 3 (three) times daily.     Marland Kitchen levothyroxine (SYNTHROID, LEVOTHROID) 75 MCG tablet Take 75 mcg by mouth daily.     . methadone (DOLOPHINE) 5 MG tablet Take 5 mg by mouth 4 (four) times daily.    . Multiple Vitamin (MULTIVITAMIN WITH MINERALS) TABS Take 1 tablet by mouth daily.    . Multiple Vitamins-Minerals (PRESERVISION/LUTEIN PO) Take 1 tablet by mouth 2 (two) times daily.    Marland Kitchen nystatin-triamcinolone ointment (MYCOLOG) as needed.    . pravastatin (PRAVACHOL) 20 MG tablet Take 20 mg by mouth at bedtime.      . senna (SENOKOT) 8.6 MG tablet Take 2-3 tablets by mouth 2 (two) times daily. 2 tablets in the morning, 3 tablets in the evening    . EPIPEN 2-PAK 0.3 MG/0.3ML SOAJ injection  11  . fesoterodine (TOVIAZ) 4 MG TB24 tablet Take 1 tablet (4 mg total) by mouth daily. (Patient not taking: Reported on 11/27/2014) 30 tablet 1   No current facility-administered medications for this visit.     ALLERGIES: Flexeril; Red dye; Ciprofloxacin; Other; Tylenol; Dilaudid; Eggs or egg-derived products; Latex; Lisinopril; Methocarbamol; Morphine and related; and Sulfa antibiotics  Family History  Problem Relation Age of Onset  . Hyperlipidemia Father   . Hypertension Father   . Heart attack Father 14    deceased    History   Social History  . Marital Status: Married    Spouse Name: N/A    Number of Children: N/A  . Years of Education: N/A   Occupational History  . Not on file.   Social History Main Topics  . Smoking status: Never Smoker   . Smokeless tobacco: Never Used     Comment: occ wine  . Alcohol Use: 0.6 oz/week    1 Not specified per week     Comment: glass of wine   . Drug Use: No  . Sexual Activity:    Partners: Male    Birth Control/ Protection: Post-menopausal   Other Topics Concern  . Not on file   Social History Narrative    ROS:  Pertinent items are noted in HPI.  PHYSICAL EXAMINATION:    BP 108/60 mmHg  Pulse 64  Resp 18  Ht 5' 3.25" (1.607 m)  Wt 193 lb (87.544 kg)  BMI 33.90 kg/m2  LMP 12/06/2000     General appearance: alert, cooperative and appears stated age Abdomen: incision intact, soft, non-tender; no masses,  no organomegaly No abnormal inguinal nodes palpated  Pelvic: External genitalia:  no lesions              Urethra:  normal appearing urethra with no masses, tenderness or lesions              Bartholins and Skenes: normal                 Vagina: normal appearing vagina with normal color and discharge, no lesions              Cervix:  normal appearance.  (Still has a portion of cervix remaining.)                   Bimanual Exam:  Uterus:  Absent.                                      Adnexa:  No masses                                   ASSESSMENT  Status post total abdominal hysterectomy with bilateral salpingo-oophorectomy, Halban's culdoplasty, anterior and posterior colporrhaphy, TVT Exact midurethral sling with cystoscopy. Status post transfusion of 2 units of packed red blood cells for post op anemia attributed to an underestimate of EBL from surgery.   Dysuria.    PLA  Surgical care reviewed and questions answered.   Urine culture. Return for final post op check in one month. Continue decreased activity for 3 months post op.    An After Visit Summary was printed and given to the patient.  ___25___ minutes face to face time of which over 50% was spent in counseling.

## 2014-11-29 LAB — URINE CULTURE
COLONY COUNT: NO GROWTH
ORGANISM ID, BACTERIA: NO GROWTH

## 2014-12-18 ENCOUNTER — Ambulatory Visit: Payer: Medicare Other | Admitting: Obstetrics and Gynecology

## 2014-12-25 ENCOUNTER — Encounter: Payer: Self-pay | Admitting: Obstetrics and Gynecology

## 2014-12-25 ENCOUNTER — Ambulatory Visit (INDEPENDENT_AMBULATORY_CARE_PROVIDER_SITE_OTHER): Payer: Medicare Other | Admitting: Obstetrics and Gynecology

## 2014-12-25 VITALS — BP 124/72 | HR 64 | Ht 63.25 in | Wt 190.2 lb

## 2014-12-25 DIAGNOSIS — Z9889 Other specified postprocedural states: Secondary | ICD-10-CM

## 2014-12-25 DIAGNOSIS — N3281 Overactive bladder: Secondary | ICD-10-CM

## 2014-12-25 NOTE — Progress Notes (Signed)
Patient ID: [name] [name], female   DOB: Jul 12, 1941, 74 y.o.   MRN: [id number] GYNECOLOGY  VISIT   HPI: 74 y.o.   Married  Caucasian  female   G60P2 with Patient's last menstrual period was 12/06/2000.   here for 11 weeks post op.  Surgery done 10/07/14: Status post total abdominal hysterectomy with bilateral salpingo-oophorectomy, Halban's culdoplasty, anterior and posterior colporrhaphy, TVT Exact midurethral sling with cystoscopy. Status post transfusion of 2 units of packed red blood cells for post op anemia attributed to an underestimate of EBL from surgery.   UC negatiive 12/01/14.   Would like to have more bladder control. No leak with cough and sneeze.  Toviaz caused dry mouth.   Had a touch of vaginal bleeding last night.  Did some carrying of a plant a couple of days ago.   GYNECOLOGIC HISTORY: Patient's last menstrual period was 12/06/2000. Contraception: posmenopausal--TAH/BSO   Menopausal hormone therapy: None        OB History    Gravida Para Term Preterm AB TAB SAB Ectopic Multiple Living   9 2        2          Patient Active Problem List   Diagnosis Date Noted  . S/P TAH (total abdominal hysterectomy) 10/08/2014  . Pelvic prolapse 10/07/2014  . Cystocele 08/16/2014  . Glenohumeral arthritis 05/09/2014  . 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 Hypertension   . Hypothyroidism   . GERD (gastroesophageal reflux disease)   . Chronic pain     lower back  . Bronchitis     history only  . Ulcer     hx  . Virus not detected 2010    postop, caught virus in hosp., respiratory symptoms, upon d/c to home, had allergic reaction /w plate size hives - not sure of the cause    . Sinus infection     recent sinus infection, finished Zpak- 08/16/2013  . Female bladder prolapse   . Anemia     took iron for a while  . SVD (spontaneous vaginal delivery)     x 2  . Missed ab     x 6 - 2 surgery, 4 resolved on it's own  .  Seasonal allergies   . Arthritis     lower back  . 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 PONV (postoperative nausea and vomiting)     states she had scop patch in past,states she sets off alarms when she is waking up, she "forgets to breathe"     Past Surgical History  Procedure Laterality Date  . Hammer toe surgery  4/12    lt foot-2-3  . Appendectomy    . Metatarsal osteotomy  3/12    lt  . Thumb fusion  2008    rt  . Carpal tunnel release      both  . Knee arthroscopy  2007    both  . Orif toe fracture  11/04/2011    Procedure: OPEN REDUCTION INTERNAL FIXATION (ORIF) METATARSAL (TOE) FRACTURE;  Surgeon: Wylene Simmer, MD;  Location: [facility];  Service: Orthopedics;  Laterality: Left;  left revision orif 1st metatarsal with autograft from left calcaneous and left 2nd-3rd metatarsal weil osteotomy  . 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 Dilation and curettage of uterus      x2  .  Total knee arthroplasty  08/29/2012    Procedure: TOTAL KNEE ARTHROPLASTY;  Surgeon: Hessie Dibble, MD;  Location: [facility];  Service: Orthopedics;  Laterality: Right;  . Other surgical history  05/08/13    hardware removed from left foot  . Bunionectomy  03/02/11 and 11/04/11    left foot x2  . Joint replacement      R- TKA- 2013  . Tubal ligation    . Total shoulder arthroplasty Right 05/09/2014    Procedure: TOTAL SHOULDER ARTHROPLASTY;  Surgeon: Nita Sells, MD;  Location: [facility];  Service: Orthopedics;  Laterality: Right;  Right shoulder arthroplasty  . Lumbar laminectomy  05/1999    L4-L5  . Back surgery  09,00,10,99, 6/14,9/14    x6 surgeries on back -Spinal Fusin L4-L5, Lumbar Fusion L1-S1  . Breast surgery      left cyst- benign  . Abdominal hysterectomy Bilateral 10/07/2014    Procedure: TOTAL ABDOMINAL HYSTERECTOMY WITH BILATERAL SALPINGO OOPHORECTOMY AND HALBANS CULDOELASTY ;  Surgeon: Lyman Speller, MD;  Location: [facility];  Service:  Gynecology;  Laterality: Bilateral;  Miller primary/Silva assist  4 hours total  . Anterior and posterior repair N/A 10/07/2014    Procedure: ANTERIOR (CYSTOCELE) AND POSTERIOR REPAIR (RECTOCELE);  Surgeon: Lyman Speller, MD;  Location: [facility];  Service: Gynecology;  Laterality: N/A;  . Bladder suspension N/A 10/07/2014    Procedure: TRANSVAGINAL TAPE (TVT) EXACT PROCEDURE;  Surgeon: Lyman Speller, MD;  Location: [facility];  Service: Gynecology;  Laterality: N/A;  . Cystoscopy N/A 10/07/2014    Procedure: CYSTOSCOPY;  Surgeon: Lyman Speller, MD;  Location: [facility];  Service: Gynecology;  Laterality: N/A;    Current Outpatient Prescriptions  Medication Sig Dispense Refill  . Ascorbic Acid (VITAMIN C PO) Take 1,000 mg by mouth daily.     Marland Kitchen aspirin EC 81 MG tablet Take 81 mg by mouth daily.    . Biotin 5000 MCG CAPS Take 1 capsule by mouth daily.    . Calcium Carbonate-Vit D-Min (CALCIUM 1200 PO) Take 1,200 mg by mouth daily.     . carvedilol (COREG) 6.25 MG tablet     . diazepam (VALIUM) 5 MG tablet Take 5 mg by mouth every 8 (eight) hours as needed for muscle spasms (for back spasms after surgery).    Marland Kitchen EPIPEN 2-PAK 0.3 MG/0.3ML SOAJ injection   11  . Flaxseed, Linseed, (FLAX SEED OIL PO) Take 2,000 mg by mouth daily.     . furosemide (LASIX) 40 MG tablet Take 40 mg by mouth daily.     Marland Kitchen gabapentin (NEURONTIN) 600 MG tablet Take 600 mg by mouth 3 (three) times daily.     Marland Kitchen levothyroxine (SYNTHROID, LEVOTHROID) 75 MCG tablet Take 75 mcg by mouth daily.     . methadone (DOLOPHINE) 5 MG tablet Take 5 mg by mouth 4 (four) times daily.    . Multiple Vitamin (MULTIVITAMIN WITH MINERALS) TABS Take 1 tablet by mouth daily.    . Multiple Vitamins-Minerals (PRESERVISION/LUTEIN PO) Take 1 tablet by mouth 2 (two) times daily.    Marland Kitchen nystatin-triamcinolone ointment (MYCOLOG) as needed.    . pravastatin (PRAVACHOL) 20 MG tablet Take 20 mg by mouth at bedtime.     . senna (SENOKOT) 8.6 MG tablet  Take 2-3 tablets by mouth 2 (two) times daily. 2 tablets in the morning, 3 tablets in the evening     No current facility-administered medications for this visit.     ALLERGIES: Flexeril; Red  dye; Ciprofloxacin; Other; Tylenol; Dilaudid; Latex; Lisinopril; Methocarbamol; Morphine and related; and Sulfa antibiotics  Family History  Problem Relation Age of Onset  . Hyperlipidemia Father   . Hypertension Father   . Heart attack Father 28    deceased    History   Social History  . Marital Status: Married    Spouse Name: N/A    Number of Children: N/A  . Years of Education: N/A   Occupational History  . Not on file.   Social History Main Topics  . Smoking status: Never Smoker   . Smokeless tobacco: Never Used     Comment: occ wine  . Alcohol Use: 0.6 oz/week    1 Not specified per week     Comment: glass of wine   . Drug Use: No  . Sexual Activity:    Partners: Male    Birth Control/ Protection: Post-menopausal     Comment: TAH/BSO   Other Topics Concern  . Not on file   Social History Narrative    ROS:  Pertinent items are noted in HPI.  PHYSICAL EXAMINATION:    BP 124/72 mmHg  Pulse 64  Ht 5' 3.25" (1.607 m)  Wt 190 lb 3.2 oz (86.274 kg)  BMI 33.41 kg/m2  LMP 12/06/2000     General appearance: alert, cooperative and appears stated age   Abdomen: incision well healed - Pfannenstiel, soft, non-tender; no masses,  no organomegaly    Pelvic: External genitalia:  Suprapubic incisions intact.              Urethra:  normal appearing urethra with no masses, tenderness or lesions              Bartholins and Skenes: normal                 Vagina: normal appearing vagina with normal color and discharge, no lesions              Cervix: normal appearance                    Bimanual Exam:  Uterus:   absent                                      Adnexa:  No masses.                                        ASSESSMENT  Doing well overall.  Overactive bladder.   Intolerance to medical therapy.   PLAN  Refer to Ileana Roup at [facility].  OK to return to normal activities.  Avoid heavy lifting and straining to reduce risk of recurrence of prolapse.  Return to Dr. Sabra Heck for well woman care and as needed.      An After Visit Summary was printed and given to the patient.  __25____ minutes face to face time of which over 50% was spent in counseling.

## 2014-12-31 DIAGNOSIS — Z780 Asymptomatic menopausal state: Secondary | ICD-10-CM | POA: Diagnosis not present

## 2015-01-01 DIAGNOSIS — Z79891 Long term (current) use of opiate analgesic: Secondary | ICD-10-CM | POA: Diagnosis not present

## 2015-01-01 DIAGNOSIS — M961 Postlaminectomy syndrome, not elsewhere classified: Secondary | ICD-10-CM | POA: Diagnosis not present

## 2015-01-01 DIAGNOSIS — G894 Chronic pain syndrome: Secondary | ICD-10-CM | POA: Diagnosis not present

## 2015-01-14 ENCOUNTER — Telehealth: Payer: Self-pay | Admitting: *Deleted

## 2015-01-14 ENCOUNTER — Ambulatory Visit (AMBULATORY_SURGERY_CENTER): Payer: Self-pay | Admitting: *Deleted

## 2015-01-14 VITALS — Ht 63.0 in | Wt 191.0 lb

## 2015-01-14 DIAGNOSIS — Z1211 Encounter for screening for malignant neoplasm of colon: Secondary | ICD-10-CM

## 2015-01-14 MED ORDER — MOVIPREP 100 G PO SOLR: ORAL | Status: DC

## 2015-01-14 NOTE — Progress Notes (Signed)
No egg or soy allergy  Pt does have a hx of PONV, no intubation problems.  No diet medications taken  Registered in [place]  Pt had a hysterectomy in November, 2015.  I am sending a note to her surgeons to make sure it is ok for her to have a colonoscopy now or if she needs to wait

## 2015-01-14 NOTE — Telephone Encounter (Signed)
This pt is scheduled for a colonoscopy on 01-28-15 at [facility] with Dr. Scarlette Shorts. She had a recent hysterectomy in November, 2015.  Given the recent surgery, I just wanted to make sure she was ok to have her procedure now or if she needs to wait.  Thank you, Cyril Mourning

## 2015-01-14 NOTE — Telephone Encounter (Signed)
Dr. Henrene Pastor,  This pt is coming in for a screening colonoscopy on 01-28-15.  She takes Methadone daily as well as Senna laxatives.  When I asked her about constipation, she states that she is regular- having a bowel movement twice daily, as long as she stays on her laxative.  I didn't change her prep, but please advise if you'd like her to have a more extensive prep.  Thanks, [employer]

## 2015-01-14 NOTE — Telephone Encounter (Signed)
I agree with you.  Thanks.  

## 2015-01-15 ENCOUNTER — Telehealth: Payer: Self-pay | Admitting: Obstetrics & Gynecology

## 2015-01-15 NOTE — Telephone Encounter (Signed)
Left message for patient to call back. Need to advise that she is scheduled at [facility] with Ileana Roup on Feb 22 @10am 

## 2015-01-16 NOTE — Telephone Encounter (Signed)
Patient was seen by Dr Quincy Simmonds for final post op check on 12-25-14 and released to resume normal activities.  Dr Quincy Simmonds is out of the office until Monday for me to confirm with her personally. Since the procedure is 01-28-15, I think we are ok to proceed as scheduled and Dr Quincy Simmonds can review on Monday. Thank you.

## 2015-01-17 NOTE — Telephone Encounter (Signed)
Returning call.

## 2015-01-17 NOTE — Telephone Encounter (Signed)
Spoke with patient. She has received correspondence from [facility] and billing information regarding her surgery. She states that the billing does not mention ovary removal. Advised patient that ovaries were removed during surgery. Patient is agreeable, she "just wanted to make sure." She will call back with any further questions.  Routing to provider for final review. Patient agreeable to disposition. Will close encounter

## 2015-01-17 NOTE — Telephone Encounter (Signed)
Spoke with patient. Advised of appt with Ileana Roup and their office telephone #. Patient agreeable.  Patient has questions pertaining to whether or not her ovaries were removed. Okay to message on MyChart.

## 2015-01-20 NOTE — Telephone Encounter (Signed)
OK to proceed with colonoscopy and all normal activities.

## 2015-01-20 NOTE — Telephone Encounter (Signed)
Cleared for colonoscopy per Dr Quincy Simmonds. Let me know if you have any additional questions.    Encounter closed.

## 2015-01-27 DIAGNOSIS — N814 Uterovaginal prolapse, unspecified: Secondary | ICD-10-CM | POA: Diagnosis not present

## 2015-01-27 DIAGNOSIS — M6281 Muscle weakness (generalized): Secondary | ICD-10-CM | POA: Diagnosis not present

## 2015-01-27 DIAGNOSIS — N3946 Mixed incontinence: Secondary | ICD-10-CM | POA: Diagnosis not present

## 2015-01-28 ENCOUNTER — Encounter: Payer: Medicare Other | Admitting: Internal Medicine

## 2015-02-10 DIAGNOSIS — N814 Uterovaginal prolapse, unspecified: Secondary | ICD-10-CM | POA: Diagnosis not present

## 2015-02-10 DIAGNOSIS — R278 Other lack of coordination: Secondary | ICD-10-CM | POA: Diagnosis not present

## 2015-02-10 DIAGNOSIS — M6281 Muscle weakness (generalized): Secondary | ICD-10-CM | POA: Diagnosis not present

## 2015-02-10 DIAGNOSIS — N3946 Mixed incontinence: Secondary | ICD-10-CM | POA: Diagnosis not present

## 2015-02-17 DIAGNOSIS — R278 Other lack of coordination: Secondary | ICD-10-CM | POA: Diagnosis not present

## 2015-02-17 DIAGNOSIS — M6281 Muscle weakness (generalized): Secondary | ICD-10-CM | POA: Diagnosis not present

## 2015-02-17 DIAGNOSIS — N3946 Mixed incontinence: Secondary | ICD-10-CM | POA: Diagnosis not present

## 2015-02-17 DIAGNOSIS — N814 Uterovaginal prolapse, unspecified: Secondary | ICD-10-CM | POA: Diagnosis not present

## 2015-02-24 DIAGNOSIS — N814 Uterovaginal prolapse, unspecified: Secondary | ICD-10-CM | POA: Diagnosis not present

## 2015-02-24 DIAGNOSIS — M2142 Flat foot [pes planus] (acquired), left foot: Secondary | ICD-10-CM | POA: Diagnosis not present

## 2015-02-24 DIAGNOSIS — N3946 Mixed incontinence: Secondary | ICD-10-CM | POA: Diagnosis not present

## 2015-02-24 DIAGNOSIS — M2170 Unequal limb length (acquired), unspecified site: Secondary | ICD-10-CM | POA: Diagnosis not present

## 2015-02-24 DIAGNOSIS — M25579 Pain in unspecified ankle and joints of unspecified foot: Secondary | ICD-10-CM | POA: Diagnosis not present

## 2015-02-24 DIAGNOSIS — R278 Other lack of coordination: Secondary | ICD-10-CM | POA: Diagnosis not present

## 2015-02-24 DIAGNOSIS — M6281 Muscle weakness (generalized): Secondary | ICD-10-CM | POA: Diagnosis not present

## 2015-02-27 ENCOUNTER — Encounter: Payer: Self-pay | Admitting: Gastroenterology

## 2015-03-03 DIAGNOSIS — G894 Chronic pain syndrome: Secondary | ICD-10-CM | POA: Diagnosis not present

## 2015-03-03 DIAGNOSIS — M961 Postlaminectomy syndrome, not elsewhere classified: Secondary | ICD-10-CM | POA: Diagnosis not present

## 2015-03-03 DIAGNOSIS — Z79891 Long term (current) use of opiate analgesic: Secondary | ICD-10-CM | POA: Diagnosis not present

## 2015-03-19 ENCOUNTER — Encounter: Payer: Medicare Other | Admitting: Internal Medicine

## 2015-03-25 DIAGNOSIS — S60222A Contusion of left hand, initial encounter: Secondary | ICD-10-CM | POA: Diagnosis not present

## 2015-03-25 DIAGNOSIS — M65332 Trigger finger, left middle finger: Secondary | ICD-10-CM | POA: Diagnosis not present

## 2015-04-07 DIAGNOSIS — M6281 Muscle weakness (generalized): Secondary | ICD-10-CM | POA: Diagnosis not present

## 2015-04-07 DIAGNOSIS — R278 Other lack of coordination: Secondary | ICD-10-CM | POA: Diagnosis not present

## 2015-04-07 DIAGNOSIS — N814 Uterovaginal prolapse, unspecified: Secondary | ICD-10-CM | POA: Diagnosis not present

## 2015-04-07 DIAGNOSIS — N3946 Mixed incontinence: Secondary | ICD-10-CM | POA: Diagnosis not present

## 2015-04-14 DIAGNOSIS — L905 Scar conditions and fibrosis of skin: Secondary | ICD-10-CM | POA: Diagnosis not present

## 2015-04-14 DIAGNOSIS — D1801 Hemangioma of skin and subcutaneous tissue: Secondary | ICD-10-CM | POA: Diagnosis not present

## 2015-04-14 DIAGNOSIS — L821 Other seborrheic keratosis: Secondary | ICD-10-CM | POA: Diagnosis not present

## 2015-04-16 ENCOUNTER — Ambulatory Visit (AMBULATORY_SURGERY_CENTER): Payer: Self-pay

## 2015-04-16 VITALS — Ht 63.0 in | Wt 190.0 lb

## 2015-04-16 DIAGNOSIS — Z1211 Encounter for screening for malignant neoplasm of colon: Secondary | ICD-10-CM

## 2015-04-16 NOTE — Progress Notes (Signed)
No allergies to eggs or soy No diet/weight loss meds No home oxygen No past problems with anesthesia  Has email  Emmi instructions given for colonoscopy 

## 2015-04-21 DIAGNOSIS — M65332 Trigger finger, left middle finger: Secondary | ICD-10-CM | POA: Diagnosis not present

## 2015-04-21 DIAGNOSIS — Z96611 Presence of right artificial shoulder joint: Secondary | ICD-10-CM | POA: Diagnosis not present

## 2015-04-21 DIAGNOSIS — M19042 Primary osteoarthritis, left hand: Secondary | ICD-10-CM | POA: Diagnosis not present

## 2015-04-22 DIAGNOSIS — I10 Essential (primary) hypertension: Secondary | ICD-10-CM | POA: Diagnosis not present

## 2015-04-22 DIAGNOSIS — Z6833 Body mass index (BMI) 33.0-33.9, adult: Secondary | ICD-10-CM | POA: Diagnosis not present

## 2015-04-22 DIAGNOSIS — M545 Low back pain: Secondary | ICD-10-CM | POA: Diagnosis not present

## 2015-04-28 DIAGNOSIS — Z96611 Presence of right artificial shoulder joint: Secondary | ICD-10-CM | POA: Diagnosis not present

## 2015-04-28 DIAGNOSIS — M1711 Unilateral primary osteoarthritis, right knee: Secondary | ICD-10-CM | POA: Diagnosis not present

## 2015-04-28 DIAGNOSIS — N814 Uterovaginal prolapse, unspecified: Secondary | ICD-10-CM | POA: Diagnosis not present

## 2015-04-28 DIAGNOSIS — M6281 Muscle weakness (generalized): Secondary | ICD-10-CM | POA: Diagnosis not present

## 2015-04-28 DIAGNOSIS — Z79891 Long term (current) use of opiate analgesic: Secondary | ICD-10-CM | POA: Diagnosis not present

## 2015-04-28 DIAGNOSIS — M25561 Pain in right knee: Secondary | ICD-10-CM | POA: Diagnosis not present

## 2015-04-28 DIAGNOSIS — N3946 Mixed incontinence: Secondary | ICD-10-CM | POA: Diagnosis not present

## 2015-04-28 DIAGNOSIS — G894 Chronic pain syndrome: Secondary | ICD-10-CM | POA: Diagnosis not present

## 2015-04-28 DIAGNOSIS — M961 Postlaminectomy syndrome, not elsewhere classified: Secondary | ICD-10-CM | POA: Diagnosis not present

## 2015-04-28 DIAGNOSIS — R278 Other lack of coordination: Secondary | ICD-10-CM | POA: Diagnosis not present

## 2015-04-29 DIAGNOSIS — M6281 Muscle weakness (generalized): Secondary | ICD-10-CM | POA: Diagnosis not present

## 2015-04-29 DIAGNOSIS — M545 Low back pain: Secondary | ICD-10-CM | POA: Diagnosis not present

## 2015-04-29 DIAGNOSIS — M6240 Contracture of muscle, unspecified site: Secondary | ICD-10-CM | POA: Diagnosis not present

## 2015-04-29 DIAGNOSIS — M546 Pain in thoracic spine: Secondary | ICD-10-CM | POA: Diagnosis not present

## 2015-04-30 ENCOUNTER — Encounter: Payer: Self-pay | Admitting: Gastroenterology

## 2015-04-30 ENCOUNTER — Ambulatory Visit (AMBULATORY_SURGERY_CENTER): Payer: Medicare Other | Admitting: Gastroenterology

## 2015-04-30 VITALS — BP 99/40 | HR 68 | Temp 97.9°F | Resp 21 | Ht 63.0 in | Wt 190.0 lb

## 2015-04-30 DIAGNOSIS — Z1211 Encounter for screening for malignant neoplasm of colon: Secondary | ICD-10-CM | POA: Diagnosis not present

## 2015-04-30 DIAGNOSIS — K635 Polyp of colon: Secondary | ICD-10-CM

## 2015-04-30 DIAGNOSIS — D122 Benign neoplasm of ascending colon: Secondary | ICD-10-CM | POA: Diagnosis not present

## 2015-04-30 DIAGNOSIS — Z79891 Long term (current) use of opiate analgesic: Secondary | ICD-10-CM | POA: Diagnosis not present

## 2015-04-30 DIAGNOSIS — M545 Low back pain: Secondary | ICD-10-CM | POA: Diagnosis not present

## 2015-04-30 DIAGNOSIS — E039 Hypothyroidism, unspecified: Secondary | ICD-10-CM | POA: Diagnosis not present

## 2015-04-30 DIAGNOSIS — I10 Essential (primary) hypertension: Secondary | ICD-10-CM | POA: Diagnosis not present

## 2015-04-30 MED ORDER — SODIUM CHLORIDE 0.9 % IV SOLN: 500.0000 mL | INTRAVENOUS | Status: DC

## 2015-04-30 NOTE — Progress Notes (Signed)
Report to PACU, RN, vss, BBS= Clear.  

## 2015-04-30 NOTE — Progress Notes (Signed)
Called to room to assist during endoscopic procedure.  Patient ID and intended procedure confirmed with present staff. Received instructions for my participation in the procedure from the performing physician.  

## 2015-04-30 NOTE — Patient Instructions (Signed)
Discharge instructions given. Handout on polyps. Resume previous medications. YOU HAD AN ENDOSCOPIC PROCEDURE TODAY AT THE [facility]:   Refer to the procedure report that was given to you for any specific questions about what was found during the examination.  If the procedure report does not answer your questions, please call your gastroenterologist to clarify.  If you requested that your care partner not be given the details of your procedure findings, then the procedure report has been included in a sealed envelope for you to review at your convenience later.  YOU SHOULD EXPECT: Some feelings of bloating in the abdomen. Passage of more gas than usual.  Walking can help get rid of the air that was put into your GI tract during the procedure and reduce the bloating. If you had a lower endoscopy (such as a colonoscopy or flexible sigmoidoscopy) you may notice spotting of blood in your stool or on the toilet paper. If you underwent a bowel prep for your procedure, you may not have a normal bowel movement for a few days.  Please Note:  You might notice some irritation and congestion in your nose or some drainage.  This is from the oxygen used during your procedure.  There is no need for concern and it should clear up in a day or so.  SYMPTOMS TO REPORT IMMEDIATELY:   Following lower endoscopy (colonoscopy or flexible sigmoidoscopy):  Excessive amounts of blood in the stool  Significant tenderness or worsening of abdominal pains  Swelling of the abdomen that is new, acute  Fever of 100F or higher   For urgent or emergent issues, a gastroenterologist can be reached at any hour by calling [phone].   DIET: Your first meal following the procedure should be a small meal and then it is ok to progress to your normal diet. Heavy or fried foods are harder to digest and may make you feel nauseous or bloated.  Likewise, meals heavy in dairy and vegetables can increase bloating.  Drink  plenty of fluids but you should avoid alcoholic beverages for 24 hours.  ACTIVITY:  You should plan to take it easy for the rest of today and you should NOT DRIVE or use heavy machinery until tomorrow (because of the sedation medicines used during the test).    FOLLOW UP: Our staff will call the number listed on your records the next business day following your procedure to check on you and address any questions or concerns that you may have regarding the information given to you following your procedure. If we do not reach you, we will leave a message.  However, if you are feeling well and you are not experiencing any problems, there is no need to return our call.  We will assume that you have returned to your regular daily activities without incident.  If any biopsies were taken you will be contacted by phone or by letter within the next 1-3 weeks.  Please call us at [phone] if you have not heard about the biopsies in 3 weeks.    SIGNATURES/CONFIDENTIALITY: You and/or your care partner have signed paperwork which will be entered into your electronic medical record.  These signatures attest to the fact that that the information above on your After Visit Summary has been reviewed and is understood.  Full responsibility of the confidentiality of this discharge information lies with you and/or your care-partner. 

## 2015-04-30 NOTE — Op Note (Signed)
[place]  [address] [place], [id number]   COLONOSCOPY PROCEDURE REPORT  PATIENT: [name], [name]  MR#: [id number] BIRTHDATE: [birth year] , 74  yrs. old GENDER: female ENDOSCOPIST: Milus Banister, MD REFERRED [id number] Philip Aspen, M.D. PROCEDURE DATE:  04/30/2015 PROCEDURE:   Colonoscopy, screening and Colonoscopy with snare polypectomy First Screening Colonoscopy - Avg.  risk and is 50 yrs.  old or older - No.  Prior Negative Screening - Now for repeat screening. 10 or more years since last screening  History of Adenoma - Now for follow-up colonoscopy & has been > or = to 3 yrs.  N/A  Polyps removed today? Yes ASA CLASS:   Class II INDICATIONS:Screening for colonic neoplasia and Colorectal Neoplasm Risk Assessment for this procedure is average risk (colonoscoyp Dr. Lajoyce Corners about 10 years ago found no polyps per patient). MEDICATIONS: Monitored anesthesia care, Propofol 120 mg IV, and ephedrine 10mg  IV  DESCRIPTION OF PROCEDURE:   After the risks benefits and alternatives of the procedure were thoroughly explained, informed consent was obtained.  The digital rectal exam revealed no abnormalities of the rectum.   The LB PCF Q180 [id number]  endoscope was introduced through the anus and advanced to the cecum, which was identified by both the appendix and ileocecal valve. No adverse events experienced.   The quality of the prep was excellent.  The instrument was then slowly withdrawn as the colon was fully examined. Estimated blood loss is zero unless otherwise noted in this procedure report.  COLON FINDINGS: A sessile polyp measuring 2 mm in size was found in the ascending colon.  A polypectomy was performed with a cold snare.  The resection was complete, the polyp tissue was completely retrieved and sent to histology.   There was melanosis staining, mild, throughout the colon.   The examination was otherwise normal. Retroflexed views revealed no abnormalities. The  time to cecum = 3.8 Withdrawal time = 7.3   The scope was withdrawn and the procedure completed. COMPLICATIONS: There were no immediate complications.  ENDOSCOPIC IMPRESSION: 1.   Sessile polyp was found in the ascending colon; polypectomy was performed with a cold snare 2.   There was melanosis staining, mild, throughout the colon 3.   The examination was otherwise normal  RECOMMENDATIONS: If the polyp(s) removed today are proven to be adenomatous (pre-cancerous) polyps, you will need a repeat colonoscopy in 5 years.  Otherwise you should continue to follow colorectal cancer screening guidelines for "routine risk" patients with colonoscopy in 10 years.  You will receive a letter within 1-2 weeks with the results of your biopsy as well as final recommendations.  Please call my office if you have not received a letter after 3 weeks.  eSigned:  Milus Banister, MD 04/30/2015 9:29 AM

## 2015-05-01 ENCOUNTER — Telehealth: Payer: Self-pay | Admitting: *Deleted

## 2015-05-01 NOTE — Telephone Encounter (Signed)
  Follow up Call-  Call back number 04/30/2015 07/02/2013  Post procedure Call Back phone  # [phone]  Permission to leave phone message Yes -     Patient questions:  Do you have a fever, pain , or abdominal swelling? No. Pain Score  0 *  Have you tolerated food without any problems? Yes.    Have you been able to return to your normal activities? Yes.    Do you have any questions about your discharge instructions: Diet   No. Medications  No. Follow up visit  No.  Do you have questions or concerns about your Care? No.  Actions: * If pain score is 4 or above: No action needed, pain <4.

## 2015-05-02 DIAGNOSIS — M6281 Muscle weakness (generalized): Secondary | ICD-10-CM | POA: Diagnosis not present

## 2015-05-02 DIAGNOSIS — M545 Low back pain: Secondary | ICD-10-CM | POA: Diagnosis not present

## 2015-05-02 DIAGNOSIS — M546 Pain in thoracic spine: Secondary | ICD-10-CM | POA: Diagnosis not present

## 2015-05-02 DIAGNOSIS — M6240 Contracture of muscle, unspecified site: Secondary | ICD-10-CM | POA: Diagnosis not present

## 2015-05-06 ENCOUNTER — Other Ambulatory Visit (HOSPITAL_COMMUNITY): Payer: Self-pay | Admitting: Orthopaedic Surgery

## 2015-05-06 DIAGNOSIS — M25561 Pain in right knee: Secondary | ICD-10-CM

## 2015-05-07 ENCOUNTER — Encounter: Payer: Self-pay | Admitting: Gastroenterology

## 2015-05-07 DIAGNOSIS — M545 Low back pain: Secondary | ICD-10-CM | POA: Diagnosis not present

## 2015-05-07 DIAGNOSIS — M6281 Muscle weakness (generalized): Secondary | ICD-10-CM | POA: Diagnosis not present

## 2015-05-07 DIAGNOSIS — M6240 Contracture of muscle, unspecified site: Secondary | ICD-10-CM | POA: Diagnosis not present

## 2015-05-07 DIAGNOSIS — R5383 Other fatigue: Secondary | ICD-10-CM | POA: Diagnosis not present

## 2015-05-07 DIAGNOSIS — M546 Pain in thoracic spine: Secondary | ICD-10-CM | POA: Diagnosis not present

## 2015-05-09 DIAGNOSIS — M546 Pain in thoracic spine: Secondary | ICD-10-CM | POA: Diagnosis not present

## 2015-05-09 DIAGNOSIS — M545 Low back pain: Secondary | ICD-10-CM | POA: Diagnosis not present

## 2015-05-09 DIAGNOSIS — M6281 Muscle weakness (generalized): Secondary | ICD-10-CM | POA: Diagnosis not present

## 2015-05-09 DIAGNOSIS — M6240 Contracture of muscle, unspecified site: Secondary | ICD-10-CM | POA: Diagnosis not present

## 2015-05-12 DIAGNOSIS — M546 Pain in thoracic spine: Secondary | ICD-10-CM | POA: Diagnosis not present

## 2015-05-12 DIAGNOSIS — M6281 Muscle weakness (generalized): Secondary | ICD-10-CM | POA: Diagnosis not present

## 2015-05-12 DIAGNOSIS — M545 Low back pain: Secondary | ICD-10-CM | POA: Diagnosis not present

## 2015-05-12 DIAGNOSIS — M6240 Contracture of muscle, unspecified site: Secondary | ICD-10-CM | POA: Diagnosis not present

## 2015-05-13 ENCOUNTER — Encounter (HOSPITAL_COMMUNITY)
Admission: RE | Admit: 2015-05-13 | Discharge: 2015-05-13 | Disposition: A | Discharge status: Home / Self Care | Payer: Medicare Other | Source: Ambulatory Visit | Attending: Orthopaedic Surgery | Admitting: Orthopaedic Surgery

## 2015-05-13 DIAGNOSIS — M25561 Pain in right knee: Secondary | ICD-10-CM | POA: Insufficient documentation

## 2015-05-13 DIAGNOSIS — Z96651 Presence of right artificial knee joint: Secondary | ICD-10-CM | POA: Diagnosis not present

## 2015-05-13 DIAGNOSIS — M1712 Unilateral primary osteoarthritis, left knee: Secondary | ICD-10-CM | POA: Diagnosis not present

## 2015-05-14 DIAGNOSIS — M546 Pain in thoracic spine: Secondary | ICD-10-CM | POA: Diagnosis not present

## 2015-05-14 DIAGNOSIS — M545 Low back pain: Secondary | ICD-10-CM | POA: Diagnosis not present

## 2015-05-14 DIAGNOSIS — M6281 Muscle weakness (generalized): Secondary | ICD-10-CM | POA: Diagnosis not present

## 2015-05-14 DIAGNOSIS — M6240 Contracture of muscle, unspecified site: Secondary | ICD-10-CM | POA: Diagnosis not present

## 2015-05-15 DIAGNOSIS — M6281 Muscle weakness (generalized): Secondary | ICD-10-CM | POA: Diagnosis not present

## 2015-05-15 DIAGNOSIS — M6240 Contracture of muscle, unspecified site: Secondary | ICD-10-CM | POA: Diagnosis not present

## 2015-05-15 DIAGNOSIS — M545 Low back pain: Secondary | ICD-10-CM | POA: Diagnosis not present

## 2015-05-15 DIAGNOSIS — M546 Pain in thoracic spine: Secondary | ICD-10-CM | POA: Diagnosis not present

## 2015-05-16 DIAGNOSIS — M6281 Muscle weakness (generalized): Secondary | ICD-10-CM | POA: Diagnosis not present

## 2015-05-16 DIAGNOSIS — M546 Pain in thoracic spine: Secondary | ICD-10-CM | POA: Diagnosis not present

## 2015-05-16 DIAGNOSIS — M6240 Contracture of muscle, unspecified site: Secondary | ICD-10-CM | POA: Diagnosis not present

## 2015-05-16 DIAGNOSIS — M545 Low back pain: Secondary | ICD-10-CM | POA: Diagnosis not present

## 2015-05-19 DIAGNOSIS — M545 Low back pain: Secondary | ICD-10-CM | POA: Diagnosis not present

## 2015-05-19 DIAGNOSIS — M6240 Contracture of muscle, unspecified site: Secondary | ICD-10-CM | POA: Diagnosis not present

## 2015-05-19 DIAGNOSIS — M6281 Muscle weakness (generalized): Secondary | ICD-10-CM | POA: Diagnosis not present

## 2015-05-19 DIAGNOSIS — M546 Pain in thoracic spine: Secondary | ICD-10-CM | POA: Diagnosis not present

## 2015-05-20 DIAGNOSIS — M6240 Contracture of muscle, unspecified site: Secondary | ICD-10-CM | POA: Diagnosis not present

## 2015-05-20 DIAGNOSIS — M546 Pain in thoracic spine: Secondary | ICD-10-CM | POA: Diagnosis not present

## 2015-05-20 DIAGNOSIS — M545 Low back pain: Secondary | ICD-10-CM | POA: Diagnosis not present

## 2015-05-20 DIAGNOSIS — M6281 Muscle weakness (generalized): Secondary | ICD-10-CM | POA: Diagnosis not present

## 2015-05-26 DIAGNOSIS — M545 Low back pain: Secondary | ICD-10-CM | POA: Diagnosis not present

## 2015-05-26 DIAGNOSIS — M546 Pain in thoracic spine: Secondary | ICD-10-CM | POA: Diagnosis not present

## 2015-05-26 DIAGNOSIS — M6281 Muscle weakness (generalized): Secondary | ICD-10-CM | POA: Diagnosis not present

## 2015-05-26 DIAGNOSIS — M6240 Contracture of muscle, unspecified site: Secondary | ICD-10-CM | POA: Diagnosis not present

## 2015-05-27 DIAGNOSIS — M545 Low back pain: Secondary | ICD-10-CM | POA: Diagnosis not present

## 2015-05-27 DIAGNOSIS — M6240 Contracture of muscle, unspecified site: Secondary | ICD-10-CM | POA: Diagnosis not present

## 2015-05-27 DIAGNOSIS — M6281 Muscle weakness (generalized): Secondary | ICD-10-CM | POA: Diagnosis not present

## 2015-05-27 DIAGNOSIS — M546 Pain in thoracic spine: Secondary | ICD-10-CM | POA: Diagnosis not present

## 2015-05-29 DIAGNOSIS — M6281 Muscle weakness (generalized): Secondary | ICD-10-CM | POA: Diagnosis not present

## 2015-05-29 DIAGNOSIS — M546 Pain in thoracic spine: Secondary | ICD-10-CM | POA: Diagnosis not present

## 2015-05-29 DIAGNOSIS — M6240 Contracture of muscle, unspecified site: Secondary | ICD-10-CM | POA: Diagnosis not present

## 2015-05-29 DIAGNOSIS — M545 Low back pain: Secondary | ICD-10-CM | POA: Diagnosis not present

## 2015-06-02 ENCOUNTER — Other Ambulatory Visit: Payer: Self-pay

## 2015-06-02 DIAGNOSIS — M545 Low back pain: Secondary | ICD-10-CM | POA: Diagnosis not present

## 2015-06-02 DIAGNOSIS — M1711 Unilateral primary osteoarthritis, right knee: Secondary | ICD-10-CM | POA: Diagnosis not present

## 2015-06-02 DIAGNOSIS — M546 Pain in thoracic spine: Secondary | ICD-10-CM | POA: Diagnosis not present

## 2015-06-02 DIAGNOSIS — M6240 Contracture of muscle, unspecified site: Secondary | ICD-10-CM | POA: Diagnosis not present

## 2015-06-02 DIAGNOSIS — M6281 Muscle weakness (generalized): Secondary | ICD-10-CM | POA: Diagnosis not present

## 2015-06-02 DIAGNOSIS — M25561 Pain in right knee: Secondary | ICD-10-CM | POA: Diagnosis not present

## 2015-06-05 DIAGNOSIS — E039 Hypothyroidism, unspecified: Secondary | ICD-10-CM | POA: Diagnosis not present

## 2015-06-05 DIAGNOSIS — Z1389 Encounter for screening for other disorder: Secondary | ICD-10-CM | POA: Diagnosis not present

## 2015-06-05 DIAGNOSIS — R5383 Other fatigue: Secondary | ICD-10-CM | POA: Diagnosis not present

## 2015-06-05 DIAGNOSIS — G4733 Obstructive sleep apnea (adult) (pediatric): Secondary | ICD-10-CM | POA: Diagnosis not present

## 2015-06-05 DIAGNOSIS — Z6834 Body mass index (BMI) 34.0-34.9, adult: Secondary | ICD-10-CM | POA: Diagnosis not present

## 2015-06-06 DIAGNOSIS — M545 Low back pain: Secondary | ICD-10-CM | POA: Diagnosis not present

## 2015-06-06 DIAGNOSIS — M6240 Contracture of muscle, unspecified site: Secondary | ICD-10-CM | POA: Diagnosis not present

## 2015-06-06 DIAGNOSIS — M546 Pain in thoracic spine: Secondary | ICD-10-CM | POA: Diagnosis not present

## 2015-06-06 DIAGNOSIS — M6281 Muscle weakness (generalized): Secondary | ICD-10-CM | POA: Diagnosis not present

## 2015-06-10 DIAGNOSIS — M25561 Pain in right knee: Secondary | ICD-10-CM | POA: Diagnosis not present

## 2015-06-10 DIAGNOSIS — M1712 Unilateral primary osteoarthritis, left knee: Secondary | ICD-10-CM | POA: Diagnosis not present

## 2015-06-23 DIAGNOSIS — Z980 Intestinal bypass and anastomosis status: Secondary | ICD-10-CM | POA: Diagnosis not present

## 2015-06-23 DIAGNOSIS — M961 Postlaminectomy syndrome, not elsewhere classified: Secondary | ICD-10-CM | POA: Diagnosis not present

## 2015-06-23 DIAGNOSIS — M25561 Pain in right knee: Secondary | ICD-10-CM | POA: Diagnosis not present

## 2015-06-23 DIAGNOSIS — Z79891 Long term (current) use of opiate analgesic: Secondary | ICD-10-CM | POA: Diagnosis not present

## 2015-06-23 DIAGNOSIS — G894 Chronic pain syndrome: Secondary | ICD-10-CM | POA: Diagnosis not present

## 2015-07-25 DIAGNOSIS — T84038A Mechanical loosening of other internal prosthetic joint, initial encounter: Secondary | ICD-10-CM | POA: Diagnosis not present

## 2015-07-25 DIAGNOSIS — M1712 Unilateral primary osteoarthritis, left knee: Secondary | ICD-10-CM | POA: Diagnosis not present

## 2015-07-25 DIAGNOSIS — Z96652 Presence of left artificial knee joint: Secondary | ICD-10-CM | POA: Diagnosis not present

## 2015-07-29 DIAGNOSIS — M545 Low back pain: Secondary | ICD-10-CM | POA: Diagnosis not present

## 2015-07-29 DIAGNOSIS — I10 Essential (primary) hypertension: Secondary | ICD-10-CM | POA: Diagnosis not present

## 2015-07-29 DIAGNOSIS — Z6833 Body mass index (BMI) 33.0-33.9, adult: Secondary | ICD-10-CM | POA: Diagnosis not present

## 2015-08-04 ENCOUNTER — Other Ambulatory Visit: Payer: Self-pay | Admitting: Obstetrics & Gynecology

## 2015-08-04 NOTE — Telephone Encounter (Signed)
Medication refill request: Mycolog  Last AEX:  08/16/14 SM Next AEX: 09/12/15 with SM Last MMG (if hormonal medication request):  Refill authorized: please advise

## 2015-08-07 NOTE — Addendum Note (Signed)
Encounter addended by: Irma Newness on: 08/07/2015  8:57 AM<BR>     Documentation filed: Charges VN

## 2015-08-18 DIAGNOSIS — Z79891 Long term (current) use of opiate analgesic: Secondary | ICD-10-CM | POA: Diagnosis not present

## 2015-08-18 DIAGNOSIS — M961 Postlaminectomy syndrome, not elsewhere classified: Secondary | ICD-10-CM | POA: Diagnosis not present

## 2015-08-18 DIAGNOSIS — G894 Chronic pain syndrome: Secondary | ICD-10-CM | POA: Diagnosis not present

## 2015-08-18 DIAGNOSIS — M25561 Pain in right knee: Secondary | ICD-10-CM | POA: Diagnosis not present

## 2015-09-11 DIAGNOSIS — M25562 Pain in left knee: Secondary | ICD-10-CM | POA: Diagnosis not present

## 2015-09-11 DIAGNOSIS — Z6834 Body mass index (BMI) 34.0-34.9, adult: Secondary | ICD-10-CM | POA: Diagnosis not present

## 2015-09-11 DIAGNOSIS — E039 Hypothyroidism, unspecified: Secondary | ICD-10-CM | POA: Diagnosis not present

## 2015-09-11 DIAGNOSIS — Z23 Encounter for immunization: Secondary | ICD-10-CM | POA: Diagnosis not present

## 2015-09-11 DIAGNOSIS — R5383 Other fatigue: Secondary | ICD-10-CM | POA: Diagnosis not present

## 2015-09-11 DIAGNOSIS — I10 Essential (primary) hypertension: Secondary | ICD-10-CM | POA: Diagnosis not present

## 2015-09-12 ENCOUNTER — Encounter: Payer: Self-pay | Admitting: Obstetrics & Gynecology

## 2015-09-12 ENCOUNTER — Ambulatory Visit (INDEPENDENT_AMBULATORY_CARE_PROVIDER_SITE_OTHER): Payer: Medicare Other | Admitting: Obstetrics & Gynecology

## 2015-09-12 VITALS — BP 112/58 | HR 68 | Resp 16 | Ht 62.5 in | Wt 195.0 lb

## 2015-09-12 DIAGNOSIS — Z124 Encounter for screening for malignant neoplasm of cervix: Secondary | ICD-10-CM | POA: Diagnosis not present

## 2015-09-12 DIAGNOSIS — Z01419 Encounter for gynecological examination (general) (routine) without abnormal findings: Secondary | ICD-10-CM | POA: Diagnosis not present

## 2015-09-12 DIAGNOSIS — D62 Acute posthemorrhagic anemia: Secondary | ICD-10-CM | POA: Diagnosis not present

## 2015-09-12 DIAGNOSIS — R5382 Chronic fatigue, unspecified: Secondary | ICD-10-CM | POA: Diagnosis not present

## 2015-09-12 NOTE — Progress Notes (Signed)
74 y.o. G22P2 MarriedCaucasianF here for annual exam.  Pt reports she is still struggling with fatigue.  Her knee is going to have to be re-replaced.  She is not looking forward to this.  Has done some water aerobics.  States Dr. Sharlett Iles thinks she has depression.  On medication but she doesn't feel this has helped.    Reports her bladder function is improved.  Pt does not leak.  She reports when she is having back pain, she will have incontinence then.  Has done pelvic PT.  She is still doing her exercise.    Denies vaginal bleeding.    Patient's last menstrual period was 12/06/2000.          Sexually active: Yes.    The current method of family planning is status post hysterectomy.    Exercising: Yes.    water aerobics for arthritis Smoker:  no  Health Maintenance: Pap:  7/14 WNL History of abnormal Pap:  no MMG:  08/26/14 BiRads 1 negative,per patient thought it was 2016 Colonoscopy:  2016-repeat in 10 years [place] BMD:   2016-Dr Sharlett Iles TDaP:  UTD with PCP Screening Labs: PCP, Hb today: PCP, Urine today: PCP   reports that she has never smoked. She has never used smokeless tobacco. She reports that she drinks about 0.6 oz of alcohol per week. She reports that she does not use illicit drugs.  Past Medical History  Diagnosis Date  . Hypertension   . Hypothyroidism   . Chronic pain     lower back  . Bronchitis     history only  . Ulcer     hx  . Virus not detected 2010    postop, caught virus in hosp., respiratory symptoms, upon d/c to home, had allergic reaction /w plate size hives - not sure of the cause    . Sinus infection     recent sinus infection, finished Zpak- 08/16/2013  . Female bladder prolapse   . Anemia     took iron for a while  . SVD (spontaneous vaginal delivery)     x 2  . Missed ab     x 6 - 2 surgery, 4 resolved on it's own  . Seasonal allergies   . Arthritis     lower back  . Neuromuscular disorder (D[place])     , left foot has some numbeness- uses  cane  . PONV (postoperative nausea and vomiting)     states she had scop patch in past,states she sets off alarms when she is waking up, she "forgets to breathe"   . Allergy   . Blood transfusion without reported diagnosis   . Cataract   . Macular degeneration     wet- right eye  dry- left eye  . Hyperlipidemia   . Osteopenia     Past Surgical History  Procedure Laterality Date  . Hammer toe surgery  4/12    lt foot-2-3  . Appendectomy    . Metatarsal osteotomy  3/12    lt  . Thumb fusion  2008    rt  . Carpal tunnel release      both  . Knee arthroscopy  2007    both  . Orif toe fracture  11/04/2011    Procedure: OPEN REDUCTION INTERNAL FIXATION (ORIF) METATARSAL (TOE) FRACTURE;  Surgeon: Wylene Simmer, MD;  Location: [facility];  Service: Orthopedics;  Laterality: Left;  left revision orif 1st metatarsal with autograft from left calcaneous and left 2nd-3rd metatarsal  weil osteotomy  . 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 Dilation and curettage of uterus      x2  . Total knee arthroplasty  08/29/2012    Procedure: TOTAL KNEE ARTHROPLASTY;  Surgeon: Hessie Dibble, MD;  Location: [facility];  Service: Orthopedics;  Laterality: Right;  . Other surgical history  05/08/13    hardware removed from left foot  . Bunionectomy  03/02/11 and 11/04/11    left foot x2  . Joint replacement      R- TKA- 2013  . Tubal ligation    . Total shoulder arthroplasty Right 05/09/2014    Procedure: TOTAL SHOULDER ARTHROPLASTY;  Surgeon: Nita Sells, MD;  Location: [facility];  Service: Orthopedics;  Laterality: Right;  Right shoulder arthroplasty  . Lumbar laminectomy  05/1999    L4-L5  . Back surgery  09,00,10,99, 6/14,9/14    x6 surgeries on back -Spinal Fusin L4-L5, Lumbar Fusion L1-S1  . Breast surgery      left cyst- benign  . Abdominal hysterectomy Bilateral 10/07/2014    Procedure: TOTAL ABDOMINAL HYSTERECTOMY WITH BILATERAL SALPINGO OOPHORECTOMY AND HALBANS CULDOELASTY ;  Surgeon:  Lyman Speller, MD;  Location: [facility];  Service: Gynecology;  Laterality: Bilateral;  Deidrea Gaetz primary/Silva assist  4 hours total  . Anterior and posterior repair N/A 10/07/2014    Procedure: ANTERIOR (CYSTOCELE) AND POSTERIOR REPAIR (RECTOCELE);  Surgeon: Lyman Speller, MD;  Location: [facility];  Service: Gynecology;  Laterality: N/A;  . Bladder suspension N/A 10/07/2014    Procedure: TRANSVAGINAL TAPE (TVT) EXACT PROCEDURE;  Surgeon: Lyman Speller, MD;  Location: [facility];  Service: Gynecology;  Laterality: N/A;  . Cystoscopy N/A 10/07/2014    Procedure: CYSTOSCOPY;  Surgeon: Lyman Speller, MD;  Location: [facility];  Service: Gynecology;  Laterality: N/A;  . Removal of two osteomas      on forehead-2008    Current Outpatient Prescriptions  Medication Sig Dispense Refill  . Ascorbic Acid (VITAMIN C PO) Take 1,000 mg by mouth daily.     Marland Kitchen aspirin EC 81 MG tablet Take 81 mg by mouth daily.    Marland Kitchen atorvastatin (LIPITOR) 20 MG tablet Take 20 mg by mouth daily.    . Biotin 5000 MCG CAPS Take 1 capsule by mouth daily.    Marland Kitchen buPROPion (WELLBUTRIN SR) 100 MG 12 hr tablet 200 mg daily.   5  . Calcium Carbonate-Vit D-Min (CALCIUM 1200 PO) Take 1,200 mg by mouth daily.     . carvedilol (COREG) 6.25 MG tablet     . diazepam (VALIUM) 5 MG tablet Take 5 mg by mouth every 8 (eight) hours as needed for muscle spasms (for back spasms after surgery).    Marland Kitchen EPIPEN 2-PAK 0.3 MG/0.3ML SOAJ injection   11  . etodolac (LODINE) 400 MG tablet   0  . Flaxseed, Linseed, (FLAX SEED OIL PO) Take 2,000 mg by mouth daily.     . furosemide (LASIX) 40 MG tablet Take 40 mg by mouth daily.     Marland Kitchen gabapentin (NEURONTIN) 600 MG tablet Take 600 mg by mouth 3 (three) times daily.     . Homeopathic Products (CLEARLIFE ALLERGY RELIEF PO) Take by mouth. Takes Allerclear daily    . levothyroxine (SYNTHROID, LEVOTHROID) 75 MCG tablet Take 75 mcg by mouth daily.     . methadone (DOLOPHINE) 5 MG tablet Take 5 mg by mouth 4  (four) times daily.    . Multiple Vitamin (MULTIVITAMIN WITH  MINERALS) TABS Take 1 tablet by mouth daily.    . Multiple Vitamins-Minerals (PRESERVISION/LUTEIN PO) Take 1 tablet by mouth 2 (two) times daily.    Marland Kitchen nystatin-triamcinolone ointment (MYCOLOG) APPLY TO AFFECTED AREA TWICE A DAY. 30 g 0  . predniSONE (DELTASONE) 20 MG tablet   0  . senna (SENOKOT) 8.6 MG tablet Take 2-3 tablets by mouth 2 (two) times daily. 2 tablets in the morning, 3 tablets in the evening     No current facility-administered medications for this visit.    Family History  Problem Relation Age of Onset  . Hyperlipidemia Father   . Hypertension Father   . Heart attack Father 19    deceased  . Colon cancer Neg Hx   . Esophageal cancer Neg Hx   . Stomach cancer Neg Hx   . Rectal cancer Neg Hx     ROS:  Pertinent items are noted in HPI.  Otherwise, a comprehensive ROS was negative.  Exam:   BP 112/58 mmHg  Pulse 68  Resp 16  Ht 5' 2.5" (1.588 m)  Wt 195 lb (88.451 kg)  BMI 35.08 kg/m2  LMP 12/06/2000  Height: 5' 2.5" (158.8 cm)  Ht Readings from Last 3 Encounters:  09/12/15 5' 2.5" (1.588 m)  04/30/15 5\' 3"  (1.6 m)  04/16/15 5\' 3"  (1.6 m)    General appearance: alert, cooperative and appears stated age Head: Normocephalic, without obvious abnormality, atraumatic Neck: no adenopathy, supple, symmetrical, trachea midline and thyroid normal to inspection and palpation Lungs: clear to auscultation bilaterally Breasts: normal appearance, no masses or tenderness Heart: regular rate and rhythm Abdomen: soft, non-tender; bowel sounds normal; no masses,  no organomegaly Extremities: extremities normal, atraumatic, no cyanosis or edema Skin: Skin color, texture, turgor normal. No rashes or lesions Lymph nodes: Cervical, supraclavicular, and axillary nodes normal. No abnormal inguinal nodes palpated Neurologic: Grossly normal   Pelvic: External genitalia:  no lesions              Urethra:  normal  appearing urethra with no masses, tenderness or lesions              Bartholins and Skenes: normal                 Vagina: normal appearing vagina with normal color and discharge, no lesions              Cervix: absent              Pap taken: No. Bimanual Exam:  Uterus:  uterus absent              Adnexa: no mass, fullness, tenderness               Rectovaginal: Confirms               Anus:  normal sphincter tone, no lesions  Chaperone was present for exam.  A:  Well Woman with normal exam  Hypertension  Elevated lipids  Chronic back pain with history of multiple surgeries Fatigue, possible depression.  H/O anemia post operatively. S/p TAH/BSO, anterior and posterior repair, Halban culdoplasty, TVT done due to cystocele.  Small portion of ectocervix still present.  Complex endometrial hyperplasia on final pathology.  H/O breast necrosis.  Normal MMG 2015.   P: Mammogram done 9/15 Pap not indicated today.  Will continue with pap smears in the future.  Pt and I discussed doing this every other year or so.   Iron and ferritin levels  obtained today  return annually or prn

## 2015-09-13 LAB — IRON: IRON: 87 ug/dL (ref 45–160)

## 2015-09-13 LAB — FERRITIN: Ferritin: 90 ng/mL (ref 10–291)

## 2015-09-17 DIAGNOSIS — M1712 Unilateral primary osteoarthritis, left knee: Secondary | ICD-10-CM | POA: Diagnosis not present

## 2015-09-25 DIAGNOSIS — M1712 Unilateral primary osteoarthritis, left knee: Secondary | ICD-10-CM | POA: Diagnosis not present

## 2015-10-03 DIAGNOSIS — M1712 Unilateral primary osteoarthritis, left knee: Secondary | ICD-10-CM | POA: Diagnosis not present

## 2015-10-13 DIAGNOSIS — Z79891 Long term (current) use of opiate analgesic: Secondary | ICD-10-CM | POA: Diagnosis not present

## 2015-10-13 DIAGNOSIS — G894 Chronic pain syndrome: Secondary | ICD-10-CM | POA: Diagnosis not present

## 2015-10-13 DIAGNOSIS — M25561 Pain in right knee: Secondary | ICD-10-CM | POA: Diagnosis not present

## 2015-10-13 DIAGNOSIS — M961 Postlaminectomy syndrome, not elsewhere classified: Secondary | ICD-10-CM | POA: Diagnosis not present

## 2015-10-15 ENCOUNTER — Other Ambulatory Visit: Payer: Self-pay

## 2015-10-15 DIAGNOSIS — Z1231 Encounter for screening mammogram for malignant neoplasm of breast: Secondary | ICD-10-CM

## 2015-10-21 ENCOUNTER — Ambulatory Visit: Payer: Self-pay | Admitting: Orthopedic Surgery

## 2015-10-21 NOTE — Progress Notes (Signed)
Preoperative surgical orders have been place into the Epic hospital system for [place] on 10/21/2015, 12:26 PM  by Mickel Crow for surgery on 11-04-2015.  Preop Total Knee orders including Experal, IV Tylenol, and IV Decadron as long as there are no contraindications to the above medications. Arlee Muslim, PA-C

## 2015-10-27 ENCOUNTER — Encounter (HOSPITAL_COMMUNITY): Payer: Self-pay

## 2015-10-27 ENCOUNTER — Encounter (HOSPITAL_COMMUNITY)
Admission: RE | Admit: 2015-10-27 | Discharge: 2015-10-27 | Disposition: A | Discharge status: Home / Self Care | Payer: Medicare Other | Source: Ambulatory Visit | Attending: Orthopedic Surgery | Admitting: Orthopedic Surgery

## 2015-10-27 DIAGNOSIS — Z01818 Encounter for other preprocedural examination: Secondary | ICD-10-CM | POA: Diagnosis not present

## 2015-10-27 HISTORY — DX: Zoster without complications: B02.9

## 2015-10-27 HISTORY — DX: Myalgic encephalomyelitis/chronic fatigue syndrome: G93.32

## 2015-10-27 HISTORY — DX: Contusion of left hand, initial encounter: S60.222A

## 2015-10-27 HISTORY — DX: Other abnormalities of gait and mobility: R26.89

## 2015-10-27 HISTORY — DX: Hallux rigidus, unspecified foot: M20.20

## 2015-10-27 HISTORY — DX: Allergic urticaria: L50.0

## 2015-10-27 HISTORY — DX: Tinnitus, unspecified ear: H93.19

## 2015-10-27 HISTORY — DX: Mechanical loosening of other internal prosthetic joint, initial encounter: T84.038A

## 2015-10-27 HISTORY — DX: Presence of unspecified artificial knee joint: Z96.659

## 2015-10-27 HISTORY — DX: Chronic fatigue, unspecified: R53.82

## 2015-10-27 HISTORY — DX: Fracture of unspecified metatarsal bone(s), unspecified foot, initial encounter for closed fracture: S92.309A

## 2015-10-27 HISTORY — DX: Unspecified urinary incontinence: R32

## 2015-10-27 HISTORY — DX: Personal history of other infectious and parasitic diseases: Z86.19

## 2015-10-27 HISTORY — DX: Anesthesia of skin: R20.0

## 2015-10-27 HISTORY — DX: Asymptomatic menopausal state: Z78.0

## 2015-10-27 HISTORY — DX: Unequal limb length (acquired), unspecified site: M21.70

## 2015-10-27 HISTORY — DX: Metatarsalgia, left foot: M77.42

## 2015-10-27 HISTORY — DX: Pneumonia, unspecified organism: J18.9

## 2015-10-27 HISTORY — DX: Flat foot (pes planus) (acquired), left foot: M21.42

## 2015-10-27 HISTORY — DX: Trigger finger, left middle finger: M65.332

## 2015-10-27 LAB — CBC
HEMATOCRIT: 36.7 % (ref 36.0–46.0)
HEMOGLOBIN: 12.3 g/dL (ref 12.0–15.0)
MCH: 31.5 pg (ref 26.0–34.0)
MCHC: 33.5 g/dL (ref 30.0–36.0)
MCV: 93.9 fL (ref 78.0–100.0)
Platelets: 202 10*3/uL (ref 150–400)
RBC: 3.91 MIL/uL (ref 3.87–5.11)
RDW: 13.1 % (ref 11.5–15.5)
WBC: 5.5 10*3/uL (ref 4.0–10.5)

## 2015-10-27 LAB — COMPREHENSIVE METABOLIC PANEL
ALBUMIN: 4.1 g/dL (ref 3.5–5.0)
ALK PHOS: 96 U/L (ref 38–126)
ALT: 20 U/L (ref 14–54)
ANION GAP: 9 (ref 5–15)
AST: 28 U/L (ref 15–41)
BILIRUBIN TOTAL: 0.9 mg/dL (ref 0.3–1.2)
BUN: 20 mg/dL (ref 6–20)
CALCIUM: 9.1 mg/dL (ref 8.9–10.3)
CO2: 31 mmol/L (ref 22–32)
CREATININE: 1.06 mg/dL — AB (ref 0.44–1.00)
Chloride: 99 mmol/L — ABNORMAL LOW (ref 101–111)
GFR calc Af Amer: 59 mL/min — ABNORMAL LOW (ref >60)
GFR calc non Af Amer: 51 mL/min — ABNORMAL LOW (ref >60)
GLUCOSE: 99 mg/dL (ref 65–99)
Potassium: 3.6 mmol/L (ref 3.5–5.1)
Sodium: 139 mmol/L (ref 135–145)
TOTAL PROTEIN: 7.2 g/dL (ref 6.5–8.1)

## 2015-10-27 LAB — URINALYSIS, ROUTINE W REFLEX MICROSCOPIC
Glucose, UA: NEGATIVE mg/dL
Hgb urine dipstick: NEGATIVE
Ketones, ur: NEGATIVE mg/dL
LEUKOCYTES UA: NEGATIVE
NITRITE: NEGATIVE
PROTEIN: NEGATIVE mg/dL
SPECIFIC GRAVITY, URINE: 1.009 (ref 1.005–1.030)
pH: 6 (ref 5.0–8.0)

## 2015-10-27 LAB — PROTIME-INR
INR: 1.01 (ref 0.00–1.49)
Prothrombin Time: 13.5 seconds (ref 11.6–15.2)

## 2015-10-27 LAB — SURGICAL PCR SCREEN
MRSA, PCR: NEGATIVE
STAPHYLOCOCCUS AUREUS: POSITIVE — AB

## 2015-10-27 LAB — ABO/RH: ABO/RH(D): O POS

## 2015-10-27 LAB — APTT: APTT: 37 s (ref 24–37)

## 2015-10-27 NOTE — Patient Instructions (Signed)
[name] [name]  10/27/2015   Your procedure is scheduled on: Tuesday November 04, 2015   Report to [facility]  Entrance take [place]  elevators to 3rd floor to  [place] at 11:00  AM.  Call this number if you have problems the morning of surgery [phone]   Remember: ONLY 1 PERSON MAY GO WITH YOU TO SHORT STAY TO GET  READY MORNING OF [place].  Do not eat food After Midnight but may take clear liquids till 7:00 am day of surgery then nothing by mouth.      Take these medicines the morning of surgery with A SIP OF WATER: Bupropion (Wellbutrin); Carvedilol (Coreg); Gabapentin (Neurontin); Levothyroxine; Lorantidine (Claritin) if needed;  Methadone                        You may not have any metal on your body including hair pins and              piercings  Do not wear jewelry, makeup, lotions, powders or colognes, deodorant             Do not wear any nail polish.  Do not shave  48 hours prior to surgery.                 Do not bring valuables to the hospital. [place].  Contacts, dentures or bridgework may not be worn into surgery.  Leave suitcase in the car. After surgery it may be brought to your room.                   Please read over the following fact sheets you were given:MRSA INFORMATION SHEET; INCENTIVE SPIROMETER; BLOOD TRANSFUSION INFORMATION SHEET _____________________________________________________________________             [facility] - Preparing for Surgery Before surgery, you can play an important role.  Because skin is not sterile, your skin needs to be as free of germs as possible.  You can reduce the number of germs on your skin by washing with CHG (chlorahexidine gluconate) soap before surgery.  CHG is an antiseptic cleaner which kills germs and bonds with the skin to continue killing germs even after washing. Please DO NOT use if you have an allergy to CHG or  antibacterial soaps.  If your skin becomes reddened/irritated stop using the CHG and inform your nurse when you arrive at Short Stay. Do not shave (including legs and underarms) for at least 48 hours prior to the first CHG shower.  You may shave your face/neck. Please follow these instructions carefully:  1.  Shower with CHG Soap the night before surgery and the  morning of Surgery.  2.  If you choose to wash your hair, wash your hair first as usual with your  normal  shampoo.  3.  After you shampoo, rinse your hair and body thoroughly to remove the  shampoo.                           4.  Use CHG as you would any other liquid soap.  You can apply chg directly  to the skin and wash  Gently with a scrungie or clean washcloth.  5.  Apply the CHG Soap to your body ONLY FROM THE NECK DOWN.   Do not use on face/ open                           Wound or open sores. Avoid contact with eyes, ears mouth and genitals (private parts).                       Wash face,  Genitals (private parts) with your normal soap.             6.  Wash thoroughly, paying special attention to the area where your surgery  will be performed.  7.  Thoroughly rinse your body with warm water from the neck down.  8.  DO NOT shower/wash with your normal soap after using and rinsing off  the CHG Soap.                9.  Pat yourself dry with a clean towel.            10.  Wear clean pajamas.            11.  Place clean sheets on your bed the night of your first shower and do not  sleep with pets. Day of Surgery : Do not apply any lotions/deodorants the morning of surgery.  Please wear clean clothes to the hospital/surgery center.  FAILURE TO FOLLOW THESE INSTRUCTIONS MAY RESULT IN THE CANCELLATION OF YOUR SURGERY PATIENT SIGNATURE_________________________________  NURSE SIGNATURE__________________________________  ________________________________________________________________________    CLEAR LIQUID  DIET   Foods Allowed                                                                     Foods Excluded  Coffee and tea, regular and decaf                             liquids that you cannot  Plain Jell-O in any flavor                                             see through such as: Fruit ices (not with fruit pulp)                                     milk, soups, orange juice  Iced Popsicles                                    All solid food Carbonated beverages, regular and diet                                    Cranberry, grape and apple juices Sports drinks like Gatorade Lightly seasoned clear broth or consume(fat free) Sugar, honey syrup  Sample Menu Breakfast                                Lunch                                     Supper Cranberry juice                    Beef broth                            Chicken broth Jell-O                                     Grape juice                           Apple juice Coffee or tea                        Jell-O                                      Popsicle                                                Coffee or tea                        Coffee or tea  _____________________________________________________________________    Incentive Spirometer  An incentive spirometer is a tool that can help keep your lungs clear and active. This tool measures how well you are filling your lungs with each breath. Taking long deep breaths may help reverse or decrease the chance of developing breathing (pulmonary) problems (especially infection) following:  A long period of time when you are unable to move or be active. BEFORE THE PROCEDURE   If the spirometer includes an indicator to show your best effort, your nurse or respiratory therapist will set it to a desired goal.  If possible, sit up straight or lean slightly forward. Try not to slouch.  Hold the incentive spirometer in an upright position. INSTRUCTIONS FOR USE   Sit on the edge of  your bed if possible, or sit up as far as you can in bed or on a chair.  Hold the incentive spirometer in an upright position.  Breathe out normally.  Place the mouthpiece in your mouth and seal your lips tightly around it.  Breathe in slowly and as deeply as possible, raising the piston or the ball toward the top of the column.  Hold your breath for 3-5 seconds or for as long as possible. Allow the piston or ball to fall to the bottom of the column.  Remove the mouthpiece from your mouth and breathe out normally.  Rest for a few seconds and repeat Steps 1 through 7 at least 10 times every 1-2 hours when you are awake. Take your time and take a few normal breaths between  deep breaths.  The spirometer may include an indicator to show your best effort. Use the indicator as a goal to work toward during each repetition.  After each set of 10 deep breaths, practice coughing to be sure your lungs are clear. If you have an incision (the cut made at the time of surgery), support your incision when coughing by placing a pillow or rolled up towels firmly against it. Once you are able to get out of bed, walk around indoors and cough well. You may stop using the incentive spirometer when instructed by your caregiver.  RISKS AND COMPLICATIONS  Take your time so you do not get dizzy or light-headed.  If you are in pain, you may need to take or ask for pain medication before doing incentive spirometry. It is harder to take a deep breath if you are having pain. AFTER USE  Rest and breathe slowly and easily.  It can be helpful to keep track of a log of your progress. Your caregiver can provide you with a simple table to help with this. If you are using the spirometer at home, follow these instructions: [place] IF:   You are having difficultly using the spirometer.  You have trouble using the spirometer as often as instructed.  Your pain medication is not giving enough relief while using  the spirometer.  You develop fever of 100.5 F (38.1 C) or higher. SEEK IMMEDIATE MEDICAL CARE IF:   You cough up bloody sputum that had not been present before.  You develop fever of 102 F (38.9 C) or greater.  You develop worsening pain at or near the incision site. MAKE SURE YOU:   Understand these instructions.  Will watch your condition.  Will get help right away if you are not doing well or get worse. Document Released: 04/04/2007 Document Revised: 02/14/2012 Document Reviewed: 06/05/2007 ExitCare Patient Information 2014 ExitCare, [place].   ________________________________________________________________________  WHAT IS A BLOOD TRANSFUSION? Blood Transfusion Information  A transfusion is the replacement of blood or some of its parts. Blood is made up of multiple cells which provide different functions.  Red blood cells carry oxygen and are used for blood loss replacement.  White blood cells fight against infection.  Platelets control bleeding.  Plasma helps clot blood.  Other blood products are available for specialized needs, such as hemophilia or other clotting disorders. BEFORE THE TRANSFUSION  Who gives blood for transfusions?   Healthy volunteers who are fully evaluated to make sure their blood is safe. This is blood bank blood. Transfusion therapy is the safest it has ever been in the practice of medicine. Before blood is taken from a donor, a complete history is taken to make sure that person has no history of diseases nor engages in risky social behavior (examples are intravenous drug use or sexual activity with multiple partners). The donor's travel history is screened to minimize risk of transmitting infections, such as malaria. The donated blood is tested for signs of infectious diseases, such as HIV and hepatitis. The blood is then tested to be sure it is compatible with you in order to minimize the chance of a transfusion reaction. If you or a relative  donates blood, this is often done in anticipation of surgery and is not appropriate for emergency situations. It takes many days to process the donated blood. RISKS AND COMPLICATIONS Although transfusion therapy is very safe and saves many lives, the main dangers of transfusion include:   Getting an infectious disease.  Developing a transfusion reaction. This is an allergic reaction to something in the blood you were given. Every precaution is taken to prevent this. The decision to have a blood transfusion has been considered carefully by your caregiver before blood is given. Blood is not given unless the benefits outweigh the risks. AFTER THE TRANSFUSION  Right after receiving a blood transfusion, you will usually feel much better and more energetic. This is especially true if your red blood cells have gotten low (anemic). The transfusion raises the level of the red blood cells which carry oxygen, and this usually causes an energy increase.  The nurse administering the transfusion will monitor you carefully for complications. HOME CARE INSTRUCTIONS  No special instructions are needed after a transfusion. You may find your energy is better. Speak with your caregiver about any limitations on activity for underlying diseases you may have. SEEK MEDICAL CARE IF:   Your condition is not improving after your transfusion.  You develop redness or irritation at the intravenous (IV) site. SEEK IMMEDIATE MEDICAL CARE IF:  Any of the following symptoms occur over the next 12 hours:  Shaking chills.  You have a temperature by mouth above 102 F (38.9 C), not controlled by medicine.  Chest, back, or muscle pain.  People around you feel you are not acting correctly or are confused.  Shortness of breath or difficulty breathing.  Dizziness and fainting.  You get a rash or develop hives.  You have a decrease in urine output.  Your urine turns a dark color or changes to pink, red, or brown. Any  of the following symptoms occur over the next 10 days:  You have a temperature by mouth above 102 F (38.9 C), not controlled by medicine.  Shortness of breath.  Weakness after normal activity.  The white part of the eye turns yellow (jaundice).  You have a decrease in the amount of urine or are urinating less often.  Your urine turns a dark color or changes to pink, red, or brown. Document Released: 11/19/2000 Document Revised: 02/14/2012 Document Reviewed: 07/08/2008 [facility] Patient Information 2014 [place], [place].  _______________________________________________________________________

## 2015-10-27 NOTE — Progress Notes (Signed)
Pt aware to contact surgeon office for instruction on medications to discontinue prior to surgical date. No instruction given prior to PAT visit 10/27/2015.

## 2015-10-27 NOTE — Progress Notes (Signed)
Clearance note per chart per Dr Philip Aspen 06/05/2015

## 2015-10-27 NOTE — Progress Notes (Signed)
Surgical screening results in epic per PAT visit 10/27/2015 positive for STAPH. Results sent to Dr Wynelle Link. Mupriocin Ointment called to [facility] - spoke with [facility] - Pharmacist. Pt is aware.

## 2015-10-27 NOTE — Progress Notes (Signed)
ECHO care everywhere epic 08/24/1994

## 2015-10-28 ENCOUNTER — Ambulatory Visit
Admission: RE | Admit: 2015-10-28 | Discharge: 2015-10-28 | Disposition: A | Discharge status: Home / Self Care | Payer: Medicare Other | Source: Ambulatory Visit

## 2015-10-28 DIAGNOSIS — Z1231 Encounter for screening mammogram for malignant neoplasm of breast: Secondary | ICD-10-CM

## 2015-10-28 DIAGNOSIS — H43813 Vitreous degeneration, bilateral: Secondary | ICD-10-CM | POA: Diagnosis not present

## 2015-10-28 DIAGNOSIS — H353232 Exudative age-related macular degeneration, bilateral, with inactive choroidal neovascularization: Secondary | ICD-10-CM | POA: Diagnosis not present

## 2015-10-28 DIAGNOSIS — H353122 Nonexudative age-related macular degeneration, left eye, intermediate dry stage: Secondary | ICD-10-CM | POA: Diagnosis not present

## 2015-11-04 ENCOUNTER — Inpatient Hospital Stay (HOSPITAL_COMMUNITY): Payer: Medicare Other | Admitting: Anesthesiology

## 2015-11-04 ENCOUNTER — Encounter (HOSPITAL_COMMUNITY): Payer: Self-pay | Admitting: *Deleted

## 2015-11-04 ENCOUNTER — Ambulatory Visit: Payer: Self-pay | Admitting: Orthopedic Surgery

## 2015-11-04 ENCOUNTER — Inpatient Hospital Stay (HOSPITAL_COMMUNITY)
Admission: RE | Admit: 2015-11-04 | Discharge: 2015-11-06 | DRG: 468 | Disposition: A | Discharge status: Home Health | Payer: Medicare Other | Source: Ambulatory Visit | Attending: Orthopedic Surgery | Admitting: Orthopedic Surgery

## 2015-11-04 ENCOUNTER — Encounter (HOSPITAL_COMMUNITY)
Admission: RE | Disposition: A | Discharge status: Home Health | Payer: Self-pay | Source: Ambulatory Visit | Attending: Orthopedic Surgery

## 2015-11-04 DIAGNOSIS — Y792 Prosthetic and other implants, materials and accessory orthopedic devices associated with adverse incidents: Secondary | ICD-10-CM | POA: Diagnosis present

## 2015-11-04 DIAGNOSIS — Z981 Arthrodesis status: Secondary | ICD-10-CM

## 2015-11-04 DIAGNOSIS — Z96652 Presence of left artificial knee joint: Secondary | ICD-10-CM | POA: Diagnosis not present

## 2015-11-04 DIAGNOSIS — M1712 Unilateral primary osteoarthritis, left knee: Secondary | ICD-10-CM | POA: Diagnosis present

## 2015-11-04 DIAGNOSIS — Z79899 Other long term (current) drug therapy: Secondary | ICD-10-CM

## 2015-11-04 DIAGNOSIS — T84012A Broken internal right knee prosthesis, initial encounter: Secondary | ICD-10-CM

## 2015-11-04 DIAGNOSIS — T84032A Mechanical loosening of internal right knee prosthetic joint, initial encounter: Secondary | ICD-10-CM | POA: Diagnosis present

## 2015-11-04 DIAGNOSIS — I10 Essential (primary) hypertension: Secondary | ICD-10-CM | POA: Diagnosis present

## 2015-11-04 DIAGNOSIS — G8929 Other chronic pain: Secondary | ICD-10-CM | POA: Diagnosis present

## 2015-11-04 DIAGNOSIS — E039 Hypothyroidism, unspecified: Secondary | ICD-10-CM | POA: Diagnosis present

## 2015-11-04 DIAGNOSIS — Z96611 Presence of right artificial shoulder joint: Secondary | ICD-10-CM | POA: Diagnosis present

## 2015-11-04 DIAGNOSIS — Z7982 Long term (current) use of aspirin: Secondary | ICD-10-CM | POA: Diagnosis not present

## 2015-11-04 DIAGNOSIS — Z01812 Encounter for preprocedural laboratory examination: Secondary | ICD-10-CM | POA: Diagnosis not present

## 2015-11-04 DIAGNOSIS — H3530 Unspecified macular degeneration: Secondary | ICD-10-CM | POA: Diagnosis present

## 2015-11-04 DIAGNOSIS — Z96651 Presence of right artificial knee joint: Secondary | ICD-10-CM | POA: Diagnosis not present

## 2015-11-04 DIAGNOSIS — M545 Low back pain: Secondary | ICD-10-CM | POA: Diagnosis present

## 2015-11-04 DIAGNOSIS — T8489XA Other specified complication of internal orthopedic prosthetic devices, implants and grafts, initial encounter: Secondary | ICD-10-CM | POA: Diagnosis not present

## 2015-11-04 DIAGNOSIS — M25562 Pain in left knee: Secondary | ICD-10-CM | POA: Diagnosis not present

## 2015-11-04 DIAGNOSIS — M25561 Pain in right knee: Secondary | ICD-10-CM | POA: Diagnosis not present

## 2015-11-04 HISTORY — PX: TOTAL KNEE REVISION: SHX996

## 2015-11-04 HISTORY — PX: STERIOD INJECTION: SHX5046

## 2015-11-04 LAB — TYPE AND SCREEN
ABO/RH(D): O POS
Antibody Screen: NEGATIVE

## 2015-11-04 SURGERY — TOTAL KNEE REVISION
Anesthesia: General | Site: Knee | Laterality: Right

## 2015-11-04 MED ORDER — FLEET ENEMA 7-19 GM/118ML RE ENEM: 1.0000 | ENEMA | Freq: Once | RECTAL | Status: DC | PRN

## 2015-11-04 MED ORDER — ACETAMINOPHEN 650 MG RE SUPP: 650.0000 mg | Freq: Four times a day (QID) | RECTAL | Status: DC | PRN

## 2015-11-04 MED ORDER — LEVOTHYROXINE SODIUM 75 MCG PO TABS
75.0000 ug | ORAL_TABLET | Freq: Every day | ORAL | Status: DC
  Administered 2015-11-05 – 2015-11-06 (×2): 75 ug via ORAL
  Filled 2015-11-04 (×3): qty 1

## 2015-11-04 MED ORDER — METOCLOPRAMIDE HCL 10 MG PO TABS: 5.0000 mg | ORAL_TABLET | Freq: Three times a day (TID) | ORAL | Status: DC | PRN

## 2015-11-04 MED ORDER — ACETAMINOPHEN 10 MG/ML IV SOLN
INTRAVENOUS | Status: AC
  Filled 2015-11-04: qty 100

## 2015-11-04 MED ORDER — FENTANYL CITRATE (PF) 100 MCG/2ML IJ SOLN
25.0000 ug | INTRAMUSCULAR | Status: DC | PRN
Start: 2015-11-04 — End: 2015-11-04
  Administered 2015-11-04 (×3): 50 ug via INTRAVENOUS

## 2015-11-04 MED ORDER — HYDROMORPHONE HCL 1 MG/ML IJ SOLN
0.5000 mg | INTRAMUSCULAR | Status: DC | PRN
  Administered 2015-11-04 – 2015-11-05 (×2): 1 mg via INTRAVENOUS
  Filled 2015-11-04 (×2): qty 1

## 2015-11-04 MED ORDER — LACTATED RINGERS IV SOLN
INTRAVENOUS | Status: AC
  Administered 2015-11-04 (×4): via INTRAVENOUS

## 2015-11-04 MED ORDER — SUGAMMADEX SODIUM 200 MG/2ML IV SOLN
INTRAVENOUS | Status: DC | PRN
  Administered 2015-11-04: 180 mg via INTRAVENOUS

## 2015-11-04 MED ORDER — CARVEDILOL 6.25 MG PO TABS
6.2500 mg | ORAL_TABLET | Freq: Two times a day (BID) | ORAL | Status: DC
  Administered 2015-11-04 – 2015-11-06 (×4): 6.25 mg via ORAL
  Filled 2015-11-04 (×6): qty 1

## 2015-11-04 MED ORDER — BUPIVACAINE LIPOSOME 1.3 % IJ SUSP
INTRAMUSCULAR | Status: DC | PRN
  Administered 2015-11-04: 20 mL

## 2015-11-04 MED ORDER — SODIUM CHLORIDE 0.9 % IJ SOLN
INTRAMUSCULAR | Status: AC
  Filled 2015-11-04: qty 50

## 2015-11-04 MED ORDER — ATORVASTATIN CALCIUM 20 MG PO TABS
20.0000 mg | ORAL_TABLET | Freq: Every day | ORAL | Status: DC
  Administered 2015-11-04 – 2015-11-05 (×2): 20 mg via ORAL
  Filled 2015-11-04 (×3): qty 1

## 2015-11-04 MED ORDER — DEXAMETHASONE SODIUM PHOSPHATE 10 MG/ML IJ SOLN
10.0000 mg | Freq: Once | INTRAMUSCULAR | Status: AC
  Administered 2015-11-05: 10 mg via INTRAVENOUS
  Filled 2015-11-04: qty 1

## 2015-11-04 MED ORDER — ONDANSETRON HCL 4 MG/2ML IJ SOLN
INTRAMUSCULAR | Status: AC
  Filled 2015-11-04: qty 2

## 2015-11-04 MED ORDER — PHENYLEPHRINE HCL 10 MG/ML IJ SOLN
INTRAMUSCULAR | Status: DC | PRN
  Administered 2015-11-04: 80 ug via INTRAVENOUS

## 2015-11-04 MED ORDER — PROPOFOL 10 MG/ML IV BOLUS
INTRAVENOUS | Status: DC | PRN
  Administered 2015-11-04: 180 mg via INTRAVENOUS

## 2015-11-04 MED ORDER — CEFAZOLIN SODIUM-DEXTROSE 2-3 GM-% IV SOLR
INTRAVENOUS | Status: AC
  Filled 2015-11-04: qty 50

## 2015-11-04 MED ORDER — METHADONE HCL 5 MG PO TABS
5.0000 mg | ORAL_TABLET | Freq: Three times a day (TID) | ORAL | Status: DC
  Administered 2015-11-04 – 2015-11-06 (×6): 5 mg via ORAL
  Filled 2015-11-04 (×6): qty 1

## 2015-11-04 MED ORDER — BUPIVACAINE LIPOSOME 1.3 % IJ SUSP
20.0000 mL | Freq: Once | INTRAMUSCULAR | Status: DC
  Filled 2015-11-04: qty 20

## 2015-11-04 MED ORDER — SODIUM CHLORIDE 0.9 % IV SOLN: INTRAVENOUS | Status: DC

## 2015-11-04 MED ORDER — SCOPOLAMINE 1 MG/3DAYS TD PT72
1.0000 | MEDICATED_PATCH | Freq: Once | TRANSDERMAL | Status: AC
  Administered 2015-11-04: 1 via TRANSDERMAL
  Filled 2015-11-04: qty 1

## 2015-11-04 MED ORDER — LIDOCAINE HCL (CARDIAC) 20 MG/ML IV SOLN
INTRAVENOUS | Status: DC | PRN
  Administered 2015-11-04: 50 mg via INTRAVENOUS

## 2015-11-04 MED ORDER — TRAMADOL HCL 50 MG PO TABS: 50.0000 mg | ORAL_TABLET | Freq: Four times a day (QID) | ORAL | Status: DC | PRN

## 2015-11-04 MED ORDER — ACETAMINOPHEN 325 MG PO TABS: 650.0000 mg | ORAL_TABLET | Freq: Four times a day (QID) | ORAL | Status: DC | PRN

## 2015-11-04 MED ORDER — LIDOCAINE HCL (CARDIAC) 20 MG/ML IV SOLN
INTRAVENOUS | Status: AC
  Filled 2015-11-04: qty 5

## 2015-11-04 MED ORDER — GABAPENTIN 300 MG PO CAPS
600.0000 mg | ORAL_CAPSULE | Freq: Three times a day (TID) | ORAL | Status: DC
  Administered 2015-11-04 – 2015-11-06 (×5): 600 mg via ORAL
  Filled 2015-11-04 (×7): qty 2

## 2015-11-04 MED ORDER — BUPIVACAINE HCL 0.25 % IJ SOLN
INTRAMUSCULAR | Status: DC | PRN
  Administered 2015-11-04: 20 mL

## 2015-11-04 MED ORDER — ONDANSETRON HCL 4 MG/2ML IJ SOLN: 4.0000 mg | Freq: Four times a day (QID) | INTRAMUSCULAR | Status: DC | PRN

## 2015-11-04 MED ORDER — LORATADINE 10 MG PO TABS
10.0000 mg | ORAL_TABLET | Freq: Every day | ORAL | Status: DC | PRN
  Filled 2015-11-04: qty 1

## 2015-11-04 MED ORDER — FENTANYL CITRATE (PF) 100 MCG/2ML IJ SOLN
INTRAMUSCULAR | Status: DC | PRN
  Administered 2015-11-04: 100 ug via INTRAVENOUS

## 2015-11-04 MED ORDER — MIDAZOLAM HCL 5 MG/5ML IJ SOLN
INTRAMUSCULAR | Status: DC | PRN
  Administered 2015-11-04: 2 mg via INTRAVENOUS

## 2015-11-04 MED ORDER — LACTATED RINGERS IV SOLN: INTRAVENOUS | Status: DC

## 2015-11-04 MED ORDER — FUROSEMIDE 40 MG PO TABS
40.0000 mg | ORAL_TABLET | Freq: Every day | ORAL | Status: DC
  Filled 2015-11-04 (×2): qty 1

## 2015-11-04 MED ORDER — EPHEDRINE SULFATE 50 MG/ML IJ SOLN
INTRAMUSCULAR | Status: DC | PRN
  Administered 2015-11-04: 5 mg via INTRAVENOUS
  Administered 2015-11-04: 10 mg via INTRAVENOUS

## 2015-11-04 MED ORDER — METOCLOPRAMIDE HCL 5 MG/ML IJ SOLN: 5.0000 mg | Freq: Three times a day (TID) | INTRAMUSCULAR | Status: DC | PRN

## 2015-11-04 MED ORDER — FENTANYL CITRATE (PF) 100 MCG/2ML IJ SOLN
INTRAMUSCULAR | Status: AC
  Filled 2015-11-04: qty 2

## 2015-11-04 MED ORDER — HYDROMORPHONE HCL 2 MG PO TABS
2.0000 mg | ORAL_TABLET | ORAL | Status: DC | PRN
  Filled 2015-11-04: qty 2

## 2015-11-04 MED ORDER — CEFAZOLIN SODIUM-DEXTROSE 2-3 GM-% IV SOLR
2.0000 g | INTRAVENOUS | Status: AC
  Administered 2015-11-04: 2 g via INTRAVENOUS

## 2015-11-04 MED ORDER — TRANEXAMIC ACID 1000 MG/10ML IV SOLN
1000.0000 mg | INTRAVENOUS | Status: AC
  Administered 2015-11-04: 1000 mg via INTRAVENOUS
  Filled 2015-11-04: qty 10

## 2015-11-04 MED ORDER — CEFAZOLIN SODIUM-DEXTROSE 2-3 GM-% IV SOLR
2.0000 g | Freq: Four times a day (QID) | INTRAVENOUS | Status: AC
  Administered 2015-11-04 – 2015-11-05 (×2): 2 g via INTRAVENOUS
  Filled 2015-11-04 (×2): qty 50

## 2015-11-04 MED ORDER — POLYETHYLENE GLYCOL 3350 17 G PO PACK: 17.0000 g | PACK | Freq: Every day | ORAL | Status: DC | PRN

## 2015-11-04 MED ORDER — ONDANSETRON HCL 4 MG PO TABS: 4.0000 mg | ORAL_TABLET | Freq: Four times a day (QID) | ORAL | Status: DC | PRN

## 2015-11-04 MED ORDER — DEXAMETHASONE SODIUM PHOSPHATE 10 MG/ML IJ SOLN
INTRAMUSCULAR | Status: AC
  Filled 2015-11-04: qty 1

## 2015-11-04 MED ORDER — ACETAMINOPHEN 10 MG/ML IV SOLN
1000.0000 mg | Freq: Once | INTRAVENOUS | Status: AC
  Administered 2015-11-04: 1000 mg via INTRAVENOUS

## 2015-11-04 MED ORDER — SUGAMMADEX SODIUM 200 MG/2ML IV SOLN
INTRAVENOUS | Status: AC
  Filled 2015-11-04: qty 2

## 2015-11-04 MED ORDER — DEXAMETHASONE SODIUM PHOSPHATE 10 MG/ML IJ SOLN
10.0000 mg | Freq: Once | INTRAMUSCULAR | Status: AC
  Administered 2015-11-04: 10 mg via INTRAVENOUS

## 2015-11-04 MED ORDER — BUPIVACAINE HCL (PF) 0.25 % IJ SOLN
INTRAMUSCULAR | Status: AC
  Filled 2015-11-04: qty 30

## 2015-11-04 MED ORDER — EPHEDRINE SULFATE 50 MG/ML IJ SOLN
INTRAMUSCULAR | Status: AC
  Filled 2015-11-04: qty 1

## 2015-11-04 MED ORDER — MIDAZOLAM HCL 2 MG/2ML IJ SOLN
INTRAMUSCULAR | Status: AC
  Filled 2015-11-04: qty 2

## 2015-11-04 MED ORDER — METHYLPREDNISOLONE ACETATE 40 MG/ML IJ SUSP
INTRAMUSCULAR | Status: AC
  Filled 2015-11-04: qty 2

## 2015-11-04 MED ORDER — HYDROMORPHONE HCL 2 MG/ML IJ SOLN
INTRAMUSCULAR | Status: AC
  Filled 2015-11-04: qty 1

## 2015-11-04 MED ORDER — CHLORHEXIDINE GLUCONATE 4 % EX LIQD
60.0000 mL | Freq: Once | CUTANEOUS | Status: DC
Start: 2015-11-04 — End: 2015-11-04

## 2015-11-04 MED ORDER — BISACODYL 10 MG RE SUPP: 10.0000 mg | Freq: Every day | RECTAL | Status: DC | PRN

## 2015-11-04 MED ORDER — PHENOL 1.4 % MT LIQD: 1.0000 | OROMUCOSAL | Status: DC | PRN

## 2015-11-04 MED ORDER — OXYCODONE HCL 5 MG PO TABS
5.0000 mg | ORAL_TABLET | ORAL | Status: DC | PRN
  Administered 2015-11-04: 10 mg via ORAL
  Administered 2015-11-05: 15 mg via ORAL
  Administered 2015-11-05: 10 mg via ORAL
  Administered 2015-11-05: 15 mg via ORAL
  Administered 2015-11-05: 10 mg via ORAL
  Administered 2015-11-05 – 2015-11-06 (×5): 15 mg via ORAL
  Filled 2015-11-04: qty 2
  Filled 2015-11-04: qty 3
  Filled 2015-11-04: qty 2
  Filled 2015-11-04 (×4): qty 3
  Filled 2015-11-04: qty 2
  Filled 2015-11-04 (×2): qty 3
  Filled 2015-11-04: qty 2

## 2015-11-04 MED ORDER — PROPOFOL 10 MG/ML IV BOLUS
INTRAVENOUS | Status: AC
  Filled 2015-11-04: qty 20

## 2015-11-04 MED ORDER — SCOPOLAMINE 1 MG/3DAYS TD PT72
MEDICATED_PATCH | TRANSDERMAL | Status: AC
  Filled 2015-11-04: qty 1

## 2015-11-04 MED ORDER — SODIUM CHLORIDE 0.9 % IJ SOLN
INTRAMUSCULAR | Status: DC | PRN
  Administered 2015-11-04: 50 mL via INTRAVENOUS

## 2015-11-04 MED ORDER — MENTHOL 3 MG MT LOZG: 1.0000 | LOZENGE | OROMUCOSAL | Status: DC | PRN

## 2015-11-04 MED ORDER — KCL IN DEXTROSE-NACL 20-5-0.9 MEQ/L-%-% IV SOLN
INTRAVENOUS | Status: DC
  Administered 2015-11-04 – 2015-11-05 (×2): via INTRAVENOUS
  Filled 2015-11-04 (×5): qty 1000

## 2015-11-04 MED ORDER — BUPROPION HCL ER (SR) 100 MG PO TB12
100.0000 mg | ORAL_TABLET | Freq: Every day | ORAL | Status: DC
  Administered 2015-11-05 – 2015-11-06 (×2): 100 mg via ORAL
  Filled 2015-11-04 (×2): qty 1

## 2015-11-04 MED ORDER — APIXABAN 2.5 MG PO TABS
2.5000 mg | ORAL_TABLET | Freq: Two times a day (BID) | ORAL | Status: DC
  Administered 2015-11-05 – 2015-11-06 (×3): 2.5 mg via ORAL
  Filled 2015-11-04 (×5): qty 1

## 2015-11-04 MED ORDER — ROCURONIUM BROMIDE 100 MG/10ML IV SOLN
INTRAVENOUS | Status: AC
  Filled 2015-11-04: qty 1

## 2015-11-04 MED ORDER — SENNA 8.6 MG PO TABS
1.0000 | ORAL_TABLET | Freq: Two times a day (BID) | ORAL | Status: DC
  Administered 2015-11-04 – 2015-11-06 (×4): 8.6 mg via ORAL

## 2015-11-04 MED ORDER — DIAZEPAM 2 MG PO TABS
4.0000 mg | ORAL_TABLET | Freq: Three times a day (TID) | ORAL | Status: DC | PRN
  Administered 2015-11-05 – 2015-11-06 (×3): 4 mg via ORAL
  Filled 2015-11-04 (×3): qty 2

## 2015-11-04 MED ORDER — PHENYLEPHRINE 40 MCG/ML (10ML) SYRINGE FOR IV PUSH (FOR BLOOD PRESSURE SUPPORT)
PREFILLED_SYRINGE | INTRAVENOUS | Status: AC
  Filled 2015-11-04: qty 10

## 2015-11-04 MED ORDER — ROCURONIUM BROMIDE 100 MG/10ML IV SOLN
INTRAVENOUS | Status: DC | PRN
  Administered 2015-11-04: 35 mg via INTRAVENOUS
  Administered 2015-11-04: 5 mg via INTRAVENOUS

## 2015-11-04 MED ORDER — SUCCINYLCHOLINE CHLORIDE 20 MG/ML IJ SOLN
INTRAMUSCULAR | Status: DC | PRN
  Administered 2015-11-04: 100 mg via INTRAVENOUS

## 2015-11-04 MED ORDER — ONDANSETRON HCL 4 MG/2ML IJ SOLN
INTRAMUSCULAR | Status: DC | PRN
  Administered 2015-11-04: 4 mg via INTRAVENOUS

## 2015-11-04 MED ORDER — HYDROMORPHONE HCL 1 MG/ML IJ SOLN
INTRAMUSCULAR | Status: DC | PRN
  Administered 2015-11-04 (×2): 1 mg via INTRAVENOUS

## 2015-11-04 SURGICAL SUPPLY — 62 items
ADAPTER BOLT FEMORAL +2/-2 (Knees) ×3 IMPLANT
ADPR FEM +2/-2 OFST BOLT (Knees) ×2 IMPLANT
ADPR FEM 5D STRL KN PFC SGM (Orthopedic Implant) ×2 IMPLANT
AUG TIB SZ3 10 REV STP WDG (Knees) ×4 IMPLANT
BAG DECANTER FOR FLEXI CONT (MISCELLANEOUS) ×4 IMPLANT
BAG SPEC THK2 15X12 ZIP CLS (MISCELLANEOUS)
BAG ZIPLOCK 12X15 (MISCELLANEOUS) IMPLANT
BANDAGE ELASTIC 6 VELCRO ST LF (GAUZE/BANDAGES/DRESSINGS) ×4 IMPLANT
BLADE SAG 18X100X1.27 (BLADE) ×4 IMPLANT
BLADE SAW SGTL 11.0X1.19X90.0M (BLADE) ×4 IMPLANT
BONE CEMENT GENTAMICIN (Cement) ×4 IMPLANT
CEMENT BONE GENTAMICIN 40 (Cement) ×6 IMPLANT
CEMENT RESTRICTOR DEPUY SZ 7 (Cement) ×3 IMPLANT
CLOTH BEACON ORANGE TIMEOUT ST (SAFETY) ×4 IMPLANT
COMP FEM CEM RT SZ3 (Orthopedic Implant) ×4 IMPLANT
COMPONENT FEM CEM RT SZ3 (Orthopedic Implant) ×1 IMPLANT
CUFF TOURN SGL QUICK 34 (TOURNIQUET CUFF) ×4
CUFF TRNQT CYL 34X4X40X1 (TOURNIQUET CUFF) ×2 IMPLANT
DRAPE U-SHAPE 47X51 STRL (DRAPES) ×4 IMPLANT
DRSG ADAPTIC 3X8 NADH LF (GAUZE/BANDAGES/DRESSINGS) ×4 IMPLANT
DRSG PAD ABDOMINAL 8X10 ST (GAUZE/BANDAGES/DRESSINGS) ×4 IMPLANT
DURAPREP 26ML APPLICATOR (WOUND CARE) ×4 IMPLANT
ELECT REM PT RETURN 9FT ADLT (ELECTROSURGICAL) ×4
ELECTRODE REM PT RTRN 9FT ADLT (ELECTROSURGICAL) ×2 IMPLANT
EVACUATOR 1/8 PVC DRAIN (DRAIN) ×4 IMPLANT
FEMORAL ADAPTER (Orthopedic Implant) ×2 IMPLANT
GAUZE SPONGE 4X4 12PLY STRL (GAUZE/BANDAGES/DRESSINGS) ×4 IMPLANT
GAUZE SPONGE 4X4 16PLY XRAY LF (GAUZE/BANDAGES/DRESSINGS) ×2 IMPLANT
GLOVE BIO SURGEON STRL SZ7.5 (GLOVE) ×12 IMPLANT
GLOVE BIO SURGEON STRL SZ8 (GLOVE) ×4 IMPLANT
GLOVE BIOGEL PI IND STRL 8 (GLOVE) ×2 IMPLANT
GLOVE BIOGEL PI INDICATOR 8 (GLOVE) ×2
GLOVE SURG SS PI 6.5 STRL IVOR (GLOVE) ×6 IMPLANT
GOWN STRL REUS W/TWL LRG LVL3 (GOWN DISPOSABLE) ×4 IMPLANT
GOWN STRL REUS W/TWL XL LVL3 (GOWN DISPOSABLE) IMPLANT
HANDPIECE INTERPULSE COAX TIP (DISPOSABLE) ×4
IMMOBILIZER KNEE 20 (SOFTGOODS) ×4
IMMOBILIZER KNEE 20 THIGH 36 (SOFTGOODS) ×2 IMPLANT
INSERT TC3 TIBIAL 3.0 (Knees) ×3 IMPLANT
MANIFOLD NEPTUNE II (INSTRUMENTS) ×4 IMPLANT
NS IRRIG 1000ML POUR BTL (IV SOLUTION) ×4 IMPLANT
PACK TOTAL KNEE CUSTOM (KITS) ×4 IMPLANT
PADDING CAST COTTON 6X4 STRL (CAST SUPPLIES) ×8 IMPLANT
POSITIONER SURGICAL ARM (MISCELLANEOUS) ×4 IMPLANT
SET HNDPC FAN SPRY TIP SCT (DISPOSABLE) ×2 IMPLANT
STEM FLUTED UNIV REV 75X20 (Stem) ×2 IMPLANT
STEM TIBIA PFC 13X30MM (Stem) ×2 IMPLANT
SUT VIC AB 2-0 CT1 27 (SUTURE) ×12
SUT VIC AB 2-0 CT1 TAPERPNT 27 (SUTURE) ×6 IMPLANT
SUT VLOC 180 0 24IN GS25 (SUTURE) ×4 IMPLANT
SWAB COLLECTION DEVICE MRSA (MISCELLANEOUS) ×3 IMPLANT
SWAB CULTURE ESWAB REG 1ML (MISCELLANEOUS) ×3 IMPLANT
SYR 50ML LL SCALE MARK (SYRINGE) ×8 IMPLANT
TOWER CARTRIDGE SMART MIX (DISPOSABLE) ×4 IMPLANT
TRAY FOLEY W/METER SILVER 14FR (SET/KITS/TRAYS/PACK) ×1 IMPLANT
TRAY FOLEY W/METER SILVER 16FR (SET/KITS/TRAYS/PACK) ×4 IMPLANT
TRAY REVISION SZ 3 (Knees) ×3 IMPLANT
TRAY SLEEVE CEM ML (Knees) ×3 IMPLANT
TUBE KAMVAC SUCTION (TUBING) IMPLANT
WATER STERILE IRR 1500ML POUR (IV SOLUTION) ×4 IMPLANT
WEDGE SZ 3 10MM (Knees) ×4 IMPLANT
WRAP KNEE MAXI GEL POST OP (GAUZE/BANDAGES/DRESSINGS) IMPLANT

## 2015-11-04 NOTE — H&P (Signed)
[name] [name] DOB: [birth year] Married / Language: English / Race: White Female Date of Admission:  11/04/2015 CC: Right Knee Pain History of Present Illness The patient is a 74 year old female who comes in for a preoperative History and Physical. The patient is scheduled for a right total knee arthroplasty (revision) to be performed by Dr. Dione Plover. Aluisio, MD at [facility] on 11-04-2015. She states that both knees bother her. She had her right total knee done by Dr. Rhona Raider in 2013. She did not have any problems with wound drainage or fever, chills, or any type of infectious issue. She states over time the knee has been hurting more and has become unstable. She was seen in second opinion by Dr. Mayer Camel and was told that the knee was a loose. She was told she need a revision. She is here for another opinion with me today. In addition, she has had a lot of pain in her left knee. She has had cortisone and viscosupplement injections in the past. The viscosupplements did help. She is still having pain in the knee. Her bigger concern however is that right knee. She has got mechanical loosening of the knee. She had an infection workup by Dr. Rhona Raider and Dr. Mayer Camel and had fluid aspirated with normal values according to her. The knee is definitely loose with mechanical shifting of the tibial component. At this point, the only predictable way of getting her better would be a total knee arthroplasty revision. We did discuss that in detail and she wants to proceed with surgery at this time. They have been treated conservatively in the past for the above stated problem and despite conservative measures, they continue to have progressive pain and severe functional limitations and dysfunction. They have failed non-operative management including home exercise, medications. It is felt that they would benefit from undergoing revision of the total joint replacement. Risks and benefits of the procedure have been  discussed with the patient and they elect to proceed with surgery. There are no active contraindications to surgery such as ongoing infection or rapidly progressive neurological disease.  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)  Osteoarthritis  Hypothyroidism  Tinnitus  Macular Degeneration  Shingles  Urinary Incontinence  Urinary Tract Infection  Chronic fatigue syndrome  Degenerative Disc Disease  Measles  Mumps  Rubella  Allergies Pravastatin Sodium *ANTIHYPERLIPIDEMICS*  Lisinopril *CHEMICALS*  Adhesive Tape  Paper tape and Steristrips are OK Sulfa 10 *OPHTHALMIC AGENTS*  Cipro *FLUOROQUINOLONES*  Latex Gloves *MEDICAL DEVICES*  OxyCODONE HCl (Abuse Deter) *ANALGESICS - OPIOID*  allergic to pink dye Methocarbamol *CHEMICALS*  Cyclobenzaprine *CHEMICALS  Intolerance Morphine Sulfate (Concentrate) *ANALGESICS - OPIOID*  just not effective - NOT ALLERGY  Family History  Hyperlipidemia Father Hypertension Father  Heart attack Father 70 deceased   Social History Children  2 Alcohol use  current drinker; drinks beer and wine; less than 5 per week Tobacco use  never smoker Pain Contract  no Number of flights of stairs before winded  2-3 Drug/Alcohol Rehab (Previously)  no Drug/Alcohol Rehab (Currently)  no Current work status  disabled  Exercise  Exercises daily; does running / walking Marital status  married Living situation  live with spouse Illicit  drug use  no Advance Directives  Living Will, Healthcare POA Post-Surgical Plans  Home  Medication History Atorvastatin Calcium (20MG  Tablet, Oral) Active. Synthroid (75MCG Tablet, Oral) Active. Furosemide (40MG  Tablet, Oral) Active. Gabapentin (600MG  Tablet, Oral) Active. Carvedilol (6.25MG  Tablet, Oral two times daily) Active. Methadone HCl (5MG  Tablet, Oral) Active. Allegra Allergy (180MG  Tablet, Oral) Active. Diazepam (2MG  Tablet, 4 mg Oral) Active. Etodolac (400MG  Tablet, Oral) Active. Vitamin C CR (1000MG  Tablet ER, Oral) Active. Flax Seed Oil (1000MG  Capsule, Oral) Active. Multivitamin/Fluoride (0.25MG  Tablet Chewable, Oral) Active. PreserVision AREDS (Oral) Active. Aspirin Childrens (81MG  Tablet Chewable, Oral) Active. Senna Lax (8.6MG  Tablet, Oral) Active. Calcium (1200-1000MG -UNIT Tablet Chewable, Oral) Active. EpiPen 2-Pak (0.3MG /0.3ML Device, Injection) Active. Biotin (5000MCG Capsule, Oral) Active. Magnesium (Oral) Specific dose unknown - Active. B12-Active (1MG  Tablet Chewable, Oral) Active.  Past Surgical History  Appendectomy  Date: 64. Dilation and Curettage of Uterus - Multiple  1968, 1975 Breast Biopsy  Date: 1998. left Removal Synovial Cyst and Laminectomy L4-5  Date: 2000. I&D Spinal Abscess  Date: 06/1999. Spinal Fusion  Date: 03/2000. Spinal Fusion L4-L5 Carpal Tunnel Repair  Date: 2004. bilateral Arthroscopy of Knee  Date: 10/2005. bilateral Right Thumb Arthroplasty  Date: 11/2007. Removal Osteomas from Forehead  Date: 11/2007. Spinal Surgery  Date: 07/2008. Laminectomy, Removal Synovial Cysts L5-S1 Lumbar Fusion L5-S1, Removal 2 Cysts  Date: 07/2009. Foot Surgery  Date: 02/2011. Left Bunion Hammertoe Surgery, 2 toes Left Foot  Date: 03/2011. Redo Bunion, Hammertoe Surgery  Date: 10/2011. Total Knee Replacement - Right  Date: 08/29/2012. Removal Hardware Left Foot  Date: 05/2013. 6 Level Lumbar Fusion, L1-S1  Date:  08/27/2013. Right Total Shoulder Replacement  Date: 05/09/2014. Total Hysterectomy and Repair of Total Bladdeer Prolaspe  Date: 10/07/2014.   Review of Systems General Not Present- Chills, Fatigue, Fever, Memory Loss, Night Sweats, Weight Gain and Weight Loss. Skin Not Present- Eczema, Hives, Itching, Lesions and Rash. HEENT Not Present- Dentures, Double Vision, Headache, Hearing Loss, Tinnitus and Visual Loss. Respiratory Not Present- Allergies, Chronic Cough, Coughing up blood, Shortness of breath at rest and Shortness of breath with exertion. Cardiovascular Not Present- Chest Pain, Difficulty Breathing Lying Down, Murmur, Palpitations, Racing/skipping heartbeats and Swelling. Gastrointestinal Not Present- Abdominal Pain, Bloody Stool, Constipation, Diarrhea, Difficulty Swallowing, Heartburn, Jaundice, Loss of appetitie, Nausea and Vomiting. Female Genitourinary Not Present- Blood in Urine, Discharge, Flank Pain, Incontinence, Painful Urination, Urgency, Urinary frequency, Urinary Retention, Urinating at Night and Weak urinary stream. Musculoskeletal Present- Back Pain, Joint Pain, Joint Swelling, Morning Stiffness, Muscle Pain and Spasms. Not Present- Muscle Weakness. Neurological Not Present- Blackout spells, Difficulty with balance, Dizziness, Paralysis, Tremor and Weakness. Psychiatric Not Present- Insomnia.  Vitals Weight: 195 lb Height: 63.5in Body Surface Area: 1.92 m Body Mass Index: 34 kg/m  BP: 114/68 (Sitting, Left Arm, Standard)   Physical Exam General Mental Status -Alert, cooperative and good historian. General Appearance-pleasant, Not in acute distress. Orientation-Oriented X3. Build & Nutrition-Well nourished and Well developed.  Head and Neck Head-normocephalic, atraumatic . Neck Global Assessment - supple, no bruit auscultated on the right, no bruit auscultated on the left.  Eye Vision-Wears corrective lenses. Pupil - Bilateral-Regular and  Round. Motion - Bilateral-EOMI.  Chest and Lung Exam Auscultation Breath sounds - clear at anterior chest wall and clear at posterior chest wall. Adventitious sounds - No Adventitious sounds.  Cardiovascular Auscultation Rhythm - Regular rate and rhythm. Heart Sounds -  S1 WNL and S2 WNL. Murmurs & Other Heart Sounds - Auscultation of the heart reveals - No Murmurs.  Abdomen Palpation/Percussion Tenderness - Abdomen is non-tender to palpation. Rigidity (guarding) - Abdomen is soft. Auscultation Auscultation of the abdomen reveals - Bowel sounds normal.  Female Genitourinary Note: Not done, not pertinent to present illness  Musculoskeletal Note: Well-developed female, alert and oriented, in no apparent distress. Her hip show normal range of motion, no discomfort. Left knee shows no effusion. There is moderate crepitus on range of motion of the left knee, slight varus, range 5 to 125, tender medial greater than lateral with no instability noted. Her right knee shows slight effusion. There is no warmth about the right knee. Her ranges 0 to 115. She is very tender along the proximal tibia. She does have varus, valgus and AP laxity in this knee.  RADIOGRAPHS AP both knees and lateral show the prosthesis on the right has loosening of the tibial component with migration into varus. It looks like there may also be some lysis or loosening around the femoral component. Left knee shows advanced medial and patellofemoral degenerative change bone on bone.  Assessment & Plan Mechanical loosening of prosthetic knee, initial encounter [id number], Z96.659)  Note:Surgical Plans: Right Total Knee Revison  Disposition: Home  PCP: Dr. Leanna Battles - Patient has been seen preoperatively and felt to be stable for surgery.  IV TXA  Anesthesia Issues: Unable to do a spinal anesthetic due to the multiple back surgeries following recent hysterectomy. Postoperative nausea with  anesthesia.  Signed electronically by Joelene Millin, III PA-C

## 2015-11-04 NOTE — Interval H&P Note (Signed)
History and Physical Interval Note:  11/04/2015 1:17 PM  [name] [name]  has presented today for surgery, with the diagnosis of LOOSE RIGHT TOTAL ARTHROPLASTY  The various methods of treatment have been discussed with the patient and family. After consideration of risks, benefits and other options for treatment, the patient has consented to  Procedure(s): TOTAL KNEE ARTHROPLASTY REVISION (Right) as a surgical intervention .  The patient's history has been reviewed, patient examined, no change in status, stable for surgery.  I have reviewed the patient's chart and labs.  Questions were answered to the patient's satisfaction.     Gearlean Alf

## 2015-11-04 NOTE — Anesthesia Preprocedure Evaluation (Addendum)
Anesthesia Evaluation  Patient identified by MRN, date of birth, ID band Patient awake    Reviewed: Allergy & Precautions, H&P , NPO status , Patient's Chart, lab work & pertinent test results, reviewed documented beta blocker date and time   History of Anesthesia Complications (+) PONV and history of anesthetic complications  Airway Mallampati: II  TM Distance: >3 FB Neck ROM: full    Dental  (+) Teeth Intact, Dental Advisory Given   Pulmonary neg pulmonary ROS,    Pulmonary exam normal breath sounds clear to auscultation       Cardiovascular Exercise Tolerance: Good hypertension, On Home Beta Blockers Normal cardiovascular exam Rhythm:regular Rate:Normal     Neuro/Psych Chronic low back pain - takes gabapentin and methadone. Back surgery. Left foot numbness  Neuromuscular disease (left foot numbness, uses cane) negative psych ROS   GI/Hepatic Neg liver ROS, PUD (from ibuprofen), GERD (only with ibuprofen)  ,  Endo/Other  Hypothyroidism BMI 33.7  Renal/GU negative Renal ROS Bladder dysfunction Female GU complaint     Musculoskeletal  (+) Arthritis  (lower back s/p back surgery x6, s/p right total shoulder replacement, s/p right knee replacement),   Abdominal   Peds  Hematology   Anesthesia Other Findings Tylenol allergy - can take plain tylenol, but doesn't take it in combination products  Morphine allergy - morphine doesn't work for her  Egg allergy - eating straight eggs causes nausea, eating eggs cooked with other things (ie in cake or bread) is OK  Flexeril, red dye, cipro, latex, lisinopril, methcarbamol, sulfa - hives   Reproductive/Obstetrics negative OB ROS                            Anesthesia Physical Anesthesia Plan  ASA: III  Anesthesia Plan: General   Post-op Pain Management:    Induction: Intravenous  Airway Management Planned: Oral ETT  Additional Equipment:    Intra-op Plan:   Post-operative Plan: Extubation in OR  Informed Consent:   Plan Discussed with: Surgeon  Anesthesia Plan Comments:        Anesthesia Quick Evaluation

## 2015-11-04 NOTE — H&P (View-Only) (Signed)
[name] [name] DOB: November 09, 1941 Married / Language: English / Race: White Female Date of Admission:  11/04/2015 CC: Right Knee Pain History of Present Illness The patient is a 74 year old female who comes in for a preoperative History and Physical. The patient is scheduled for a right total knee arthroplasty (revision) to be performed by Dr. Dione Plover. Aluisio, MD at [facility] on 11-04-2015. She states that both knees bother her. She had her right total knee done by Dr. Rhona Raider in 2013. She did not have any problems with wound drainage or fever, chills, or any type of infectious issue. She states over time the knee has been hurting more and has become unstable. She was seen in second opinion by Dr. Mayer Camel and was told that the knee was a loose. She was told she need a revision. She is here for another opinion with me today. In addition, she has had a lot of pain in her left knee. She has had cortisone and viscosupplement injections in the past. The viscosupplements did help. She is still having pain in the knee. Her bigger concern however is that right knee. She has got mechanical loosening of the knee. She had an infection workup by Dr. Rhona Raider and Dr. Mayer Camel and had fluid aspirated with normal values according to her. The knee is definitely loose with mechanical shifting of the tibial component. At this point, the only predictable way of getting her better would be a total knee arthroplasty revision. We did discuss that in detail and she wants to proceed with surgery at this time. They have been treated conservatively in the past for the above stated problem and despite conservative measures, they continue to have progressive pain and severe functional limitations and dysfunction. They have failed non-operative management including home exercise, medications. It is felt that they would benefit from undergoing revision of the total joint replacement. Risks and benefits of the procedure have been  discussed with the patient and they elect to proceed with surgery. There are no active contraindications to surgery such as ongoing infection or rapidly progressive neurological disease.  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)  Osteoarthritis  Hypothyroidism  Tinnitus  Macular Degeneration  Shingles  Urinary Incontinence  Urinary Tract Infection  Chronic fatigue syndrome  Degenerative Disc Disease  Measles  Mumps  Rubella  Allergies Pravastatin Sodium *ANTIHYPERLIPIDEMICS*  Lisinopril *CHEMICALS*  Adhesive Tape  Paper tape and Steristrips are OK Sulfa 10 *OPHTHALMIC AGENTS*  Cipro *FLUOROQUINOLONES*  Latex Gloves *MEDICAL DEVICES*  OxyCODONE HCl (Abuse Deter) *ANALGESICS - OPIOID*  allergic to pink dye Methocarbamol *CHEMICALS*  Cyclobenzaprine *CHEMICALS  Intolerance Morphine Sulfate (Concentrate) *ANALGESICS - OPIOID*  just not effective - NOT ALLERGY  Family History  Hyperlipidemia Father Hypertension Father  Heart attack Father 67 deceased   Social History Children  2 Alcohol use  current drinker; drinks beer and wine; less than 5 per week Tobacco use  never smoker Pain Contract  no Number of flights of stairs before winded  2-3 Drug/Alcohol Rehab (Previously)  no Drug/Alcohol Rehab (Currently)  no Current work status  disabled  Exercise  Exercises daily; does running / walking Marital status  married Living situation  live with spouse Illicit  drug use  no Advance Directives  Living Will, Healthcare POA Post-Surgical Plans  Home  Medication History Atorvastatin Calcium (20MG  Tablet, Oral) Active. Synthroid (75MCG Tablet, Oral) Active. Furosemide (40MG  Tablet, Oral) Active. Gabapentin (600MG  Tablet, Oral) Active. Carvedilol (6.25MG  Tablet, Oral two times daily) Active. Methadone HCl (5MG  Tablet, Oral) Active. Allegra Allergy (180MG  Tablet, Oral) Active. Diazepam (2MG  Tablet, 4 mg Oral) Active. Etodolac (400MG  Tablet, Oral) Active. Vitamin C CR (1000MG  Tablet ER, Oral) Active. Flax Seed Oil (1000MG  Capsule, Oral) Active. Multivitamin/Fluoride (0.25MG  Tablet Chewable, Oral) Active. PreserVision AREDS (Oral) Active. Aspirin Childrens (81MG  Tablet Chewable, Oral) Active. Senna Lax (8.6MG  Tablet, Oral) Active. Calcium (1200-1000MG -UNIT Tablet Chewable, Oral) Active. EpiPen 2-Pak (0.3MG /0.3ML Device, Injection) Active. Biotin (5000MCG Capsule, Oral) Active. Magnesium (Oral) Specific dose unknown - Active. B12-Active (1MG  Tablet Chewable, Oral) Active.  Past Surgical History  Appendectomy  Date: 46. Dilation and Curettage of Uterus - Multiple  1968, 1975 Breast Biopsy  Date: 1998. left Removal Synovial Cyst and Laminectomy L4-5  Date: 2000. I&D Spinal Abscess  Date: 06/1999. Spinal Fusion  Date: 03/2000. Spinal Fusion L4-L5 Carpal Tunnel Repair  Date: 2004. bilateral Arthroscopy of Knee  Date: 10/2005. bilateral Right Thumb Arthroplasty  Date: 11/2007. Removal Osteomas from Forehead  Date: 11/2007. Spinal Surgery  Date: 07/2008. Laminectomy, Removal Synovial Cysts L5-S1 Lumbar Fusion L5-S1, Removal 2 Cysts  Date: 07/2009. Foot Surgery  Date: 02/2011. Left Bunion Hammertoe Surgery, 2 toes Left Foot  Date: 03/2011. Redo Bunion, Hammertoe Surgery  Date: 10/2011. Total Knee Replacement - Right  Date: 08/29/2012. Removal Hardware Left Foot  Date: 05/2013. 6 Level Lumbar Fusion, L1-S1  Date:  08/27/2013. Right Total Shoulder Replacement  Date: 05/09/2014. Total Hysterectomy and Repair of Total Bladdeer Prolaspe  Date: 10/07/2014.   Review of Systems General Not Present- Chills, Fatigue, Fever, Memory Loss, Night Sweats, Weight Gain and Weight Loss. Skin Not Present- Eczema, Hives, Itching, Lesions and Rash. HEENT Not Present- Dentures, Double Vision, Headache, Hearing Loss, Tinnitus and Visual Loss. Respiratory Not Present- Allergies, Chronic Cough, Coughing up blood, Shortness of breath at rest and Shortness of breath with exertion. Cardiovascular Not Present- Chest Pain, Difficulty Breathing Lying Down, Murmur, Palpitations, Racing/skipping heartbeats and Swelling. Gastrointestinal Not Present- Abdominal Pain, Bloody Stool, Constipation, Diarrhea, Difficulty Swallowing, Heartburn, Jaundice, Loss of appetitie, Nausea and Vomiting. Female Genitourinary Not Present- Blood in Urine, Discharge, Flank Pain, Incontinence, Painful Urination, Urgency, Urinary frequency, Urinary Retention, Urinating at Night and Weak urinary stream. Musculoskeletal Present- Back Pain, Joint Pain, Joint Swelling, Morning Stiffness, Muscle Pain and Spasms. Not Present- Muscle Weakness. Neurological Not Present- Blackout spells, Difficulty with balance, Dizziness, Paralysis, Tremor and Weakness. Psychiatric Not Present- Insomnia.  Vitals Weight: 195 lb Height: 63.5in Body Surface Area: 1.92 m Body Mass Index: 34 kg/m  BP: 114/68 (Sitting, Left Arm, Standard)   Physical Exam General Mental Status -Alert, cooperative and good historian. General Appearance-pleasant, Not in acute distress. Orientation-Oriented X3. Build & Nutrition-Well nourished and Well developed.  Head and Neck Head-normocephalic, atraumatic . Neck Global Assessment - supple, no bruit auscultated on the right, no bruit auscultated on the left.  Eye Vision-Wears corrective lenses. Pupil - Bilateral-Regular and  Round. Motion - Bilateral-EOMI.  Chest and Lung Exam Auscultation Breath sounds - clear at anterior chest wall and clear at posterior chest wall. Adventitious sounds - No Adventitious sounds.  Cardiovascular Auscultation Rhythm - Regular rate and rhythm. Heart Sounds -  S1 WNL and S2 WNL. Murmurs & Other Heart Sounds - Auscultation of the heart reveals - No Murmurs.  Abdomen Palpation/Percussion Tenderness - Abdomen is non-tender to palpation. Rigidity (guarding) - Abdomen is soft. Auscultation Auscultation of the abdomen reveals - Bowel sounds normal.  Female Genitourinary Note: Not done, not pertinent to present illness  Musculoskeletal Note: Well-developed female, alert and oriented, in no apparent distress. Her hip show normal range of motion, no discomfort. Left knee shows no effusion. There is moderate crepitus on range of motion of the left knee, slight varus, range 5 to 125, tender medial greater than lateral with no instability noted. Her right knee shows slight effusion. There is no warmth about the right knee. Her ranges 0 to 115. She is very tender along the proximal tibia. She does have varus, valgus and AP laxity in this knee.  RADIOGRAPHS AP both knees and lateral show the prosthesis on the right has loosening of the tibial component with migration into varus. It looks like there may also be some lysis or loosening around the femoral component. Left knee shows advanced medial and patellofemoral degenerative change bone on bone.  Assessment & Plan Mechanical loosening of prosthetic knee, initial encounter [id number], Z96.659)  Note:Surgical Plans: Right Total Knee Revison  Disposition: Home  PCP: Dr. Leanna Battles - Patient has been seen preoperatively and felt to be stable for surgery.  IV TXA  Anesthesia Issues: Unable to do a spinal anesthetic due to the multiple back surgeries following recent hysterectomy. Postoperative nausea with  anesthesia.  Signed electronically by Joelene Millin, III PA-C

## 2015-11-04 NOTE — Brief Op Note (Signed)
11/04/2015  3:23 PM  PATIENT:  [name] [name]  74 y.o. female  PRE-OPERATIVE DIAGNOSIS:  LOOSE RIGHT TOTAL ARTHROPLASTY, Osteoarthritis left knee  POST-OPERATIVE DIAGNOSIS:  LOOSE RIGHT TOTAL ARTHROPLASTY, Osteoarthritis left knee  PROCEDURE:  Procedure(s): TOTAL KNEE ARTHROPLASTY REVISION (Right) STEROID INJECTION (Left)  SURGEON:  Surgeon(s) and Role:    * Gaynelle Arabian, MD - Primary  PHYSICIAN ASSISTANT:   ASSISTANTS: Arlee Muslim, PA-C   ANESTHESIA:   general  EBL:  Total I/O In: 1000 [I.V.:1000] Out: 500 [Urine:500]  BLOOD ADMINISTERED:none  DRAINS: (Medium ) Hemovact drain(s) in the right knee with  Suction Open   LOCAL MEDICATIONS USED:  MARCAINE     COUNTS:  YES  TOURNIQUET:  Up 37 minutes @ 300 mm Hg; down 8 minutes; Up 20 minutes @ 300 mm Hg  DICTATION: .Other Dictation: Dictation Number [id number] (addendum [id number])  PLAN OF CARE: Admit to inpatient   PATIENT DISPOSITION:  PACU - hemodynamically stable.

## 2015-11-04 NOTE — Anesthesia Procedure Notes (Signed)
Procedure Name: Intubation Date/Time: 11/04/2015 1:34 PM Performed by: Noralyn Pick D Pre-anesthesia Checklist: Patient identified, Emergency Drugs available, Suction available and Patient being monitored Patient Re-evaluated:Patient Re-evaluated prior to inductionOxygen Delivery Method: Circle System Utilized Preoxygenation: Pre-oxygenation with 100% oxygen Intubation Type: IV induction Ventilation: Mask ventilation without difficulty Laryngoscope Size: Mac and 4 Grade View: Grade II Tube type: Oral Number of attempts: 1 Airway Equipment and Method: Stylet and Oral airway Placement Confirmation: ETT inserted through vocal cords under direct vision,  positive ETCO2 and breath sounds checked- equal and bilateral Secured at: 21 cm Tube secured with: Tape Dental Injury: Teeth and Oropharynx as per pre-operative assessment

## 2015-11-04 NOTE — Anesthesia Postprocedure Evaluation (Signed)
Anesthesia Post Note  Patient: [name] [name]  Procedure(s) Performed: Procedure(s) (LRB): TOTAL KNEE ARTHROPLASTY REVISION (Right) STEROID INJECTION (Left)  Patient location during evaluation: PACU Anesthesia Type: General Level of consciousness: awake and alert Pain management: pain level controlled Vital Signs Assessment: post-procedure vital signs reviewed and stable Respiratory status: spontaneous breathing, nonlabored ventilation, respiratory function stable and patient connected to nasal cannula oxygen Cardiovascular status: blood pressure returned to baseline and stable Postop Assessment: no signs of nausea or vomiting Anesthetic complications: no    Last Vitals:  Filed Vitals:   11/04/15 1854 11/04/15 1941  BP: 140/65 138/61  Pulse:  66  Temp:  36.3 C  Resp:  16    Last Pain:  Filed Vitals:   11/04/15 1945  PainSc: 7                  Cashtyn Pouliot L

## 2015-11-04 NOTE — Progress Notes (Signed)
Patient states she is allergic to red dye coating dilaudid po pain medication.  She has taken po oxycodone with good relief in the past.  MD paged to request medication change.

## 2015-11-04 NOTE — Transfer of Care (Signed)
Immediate Anesthesia Transfer of Care Note  Patient: [name] [name]  Procedure(s) Performed: Procedure(s): TOTAL KNEE ARTHROPLASTY REVISION (Right) STEROID INJECTION (Left)  Patient Location: PACU  Anesthesia Type:General  Level of Consciousness: awake, alert  and oriented  Airway & Oxygen Therapy: Patient Spontanous Breathing and Patient connected to face mask oxygen  Post-op Assessment: Report given to RN and Post -op Vital signs reviewed and stable  Post vital signs: Reviewed and stable  Last Vitals:  Filed Vitals:   11/04/15 1120 11/04/15 1540  BP: 132/61 153/73  Pulse: 79 92  Temp: 36.7 C 36.4 C  Resp: 16 12    Complications: No apparent anesthesia complications

## 2015-11-04 NOTE — Progress Notes (Signed)
Returned call from [facility] with telephone read back orders to change po pain medication from dilaudid to oxycodone.

## 2015-11-05 ENCOUNTER — Encounter (HOSPITAL_COMMUNITY): Payer: Self-pay | Admitting: Orthopedic Surgery

## 2015-11-05 LAB — CBC
HCT: 31.5 % — ABNORMAL LOW (ref 36.0–46.0)
HEMOGLOBIN: 10.5 g/dL — AB (ref 12.0–15.0)
MCH: 31.3 pg (ref 26.0–34.0)
MCHC: 33.3 g/dL (ref 30.0–36.0)
MCV: 93.8 fL (ref 78.0–100.0)
Platelets: 181 10*3/uL (ref 150–400)
RBC: 3.36 MIL/uL — ABNORMAL LOW (ref 3.87–5.11)
RDW: 13 % (ref 11.5–15.5)
WBC: 10.6 10*3/uL — ABNORMAL HIGH (ref 4.0–10.5)

## 2015-11-05 LAB — BASIC METABOLIC PANEL
Anion gap: 7 (ref 5–15)
BUN: 13 mg/dL (ref 6–20)
CHLORIDE: 102 mmol/L (ref 101–111)
CO2: 27 mmol/L (ref 22–32)
Calcium: 8.4 mg/dL — ABNORMAL LOW (ref 8.9–10.3)
Creatinine, Ser: 0.83 mg/dL (ref 0.44–1.00)
GLUCOSE: 151 mg/dL — AB (ref 65–99)
Potassium: 3.7 mmol/L (ref 3.5–5.1)
SODIUM: 136 mmol/L (ref 135–145)

## 2015-11-05 NOTE — Op Note (Signed)
NAME:  [name] [name], [name] [name]                    ACCOUNT NO.:  [id number]  MEDICAL RECORD NO.:  [id number]  LOCATION:  [id number]                         FACILITY:  [facility]  PHYSICIAN:  Gaynelle Arabian, M.D.    DATE OF BIRTH:  [birth year]  DATE OF PROCEDURE:  11/04/2015 DATE OF DISCHARGE:                              OPERATIVE REPORT   ADDENDUM:  DICTATION NUMBER:  [id number]  After the dressing was placed on the right knee, then I sterilely prepped the left knee with Betadine.  I then aspirated 40 mL of clear synovial fluid subsequently injecting with 80 mg of Depo-Medrol with no problems.  The patient tolerated this without difficulty.  A Band-Aid was placed.     Gaynelle Arabian, M.D.     FA/MEDQ  D:  11/04/2015  T:  11/05/2015  Job:  [id number]

## 2015-11-05 NOTE — Progress Notes (Signed)
Physical Therapy Treatment Patient Details Name: [name] [name] MRN: [id number] DOB: 1941-08-22 Today's Date: 11/05/2015    History of Present Illness R TKR revision with Aspiration and injection of L knee    PT Comments    Good progress with mobility this pm.    Follow Up Recommendations  Home health PT     Equipment Recommendations  None recommended by PT    Recommendations for Other Services OT consult     Precautions / Restrictions Precautions Precautions: Knee;Fall Required Braces or Orthoses: Knee Immobilizer - Right Knee Immobilizer - Right: Discontinue once straight leg raise with < 10 degree lag Restrictions Weight Bearing Restrictions: No Other Position/Activity Restrictions: WBAT    Mobility  Bed Mobility Overal bed mobility: Needs Assistance Bed Mobility: Sit to Supine       Sit to supine: Min assist   General bed mobility comments: oob  Transfers Overall transfer level: Needs assistance Equipment used: Rolling walker (2 wheeled) Transfers: Sit to/from Stand Sit to Stand: Min assist;Mod assist         General transfer comment: cues for LE management and use of UEs to self assist  Ambulation/Gait Ambulation/Gait assistance: Min assist Ambulation Distance (Feet): 75 Feet Assistive device: Rolling walker (2 wheeled) Gait Pattern/deviations: Step-to pattern;Decreased step length - right;Decreased step length - left;Shuffle;Trunk flexed Gait velocity: decr   General Gait Details: cues for sequence, posture and position from [employer]            Wheelchair Mobility    Modified Rankin (Stroke Patients Only)       Balance                                    Cognition Arousal/Alertness: Awake/alert Behavior During Therapy: WFL for tasks assessed/performed Overall Cognitive Status: Within Functional Limits for tasks assessed                      Exercises Total Joint Exercises Ankle Circles/Pumps:  AROM;Both;15 reps;Supine Quad Sets: AROM;Both;10 reps;Supine    General Comments        Pertinent Vitals/Pain Pain Assessment: 0-10 Pain Score: 8  Pain Location: R knee Pain Descriptors / Indicators: Aching;Sore Pain Intervention(s): Limited activity within patient's tolerance;Monitored during session;Premedicated before session;Ice applied;RN gave pain meds during session    [place] expects to be discharged to:: Private residence Living Arrangements: Spouse/significant other Available Help at Discharge: Family         Home Equipment: Bedside commode;Shower seat;Walker - 2 wheels      Prior Function Level of Independence: Independent;Independent with assistive device(s)          PT Goals (current goals can now be found in the care plan section) Acute Rehab PT Goals Patient Stated Goal: Resume previous lifestyle with decreased pain PT Goal Formulation: With patient Time For Goal Achievement: 11/08/15 Potential to Achieve Goals: Good Progress towards PT goals: Progressing toward goals    Frequency  7X/week    PT Plan Current plan remains appropriate    Co-evaluation             End of Session Equipment Utilized During Treatment: Gait belt;Right knee immobilizer Activity Tolerance: Patient tolerated treatment well;Patient limited by fatigue;Patient limited by pain Patient left: in bed;with call bell/phone within reach;with family/visitor present     Time: 1350-1417 PT Time Calculation (min) (ACUTE ONLY): 27 min  Charges:  $  Gait Training: 23-37 mins                    G Codes:      Garek Schuneman 12/01/15, 4:42 PM

## 2015-11-05 NOTE — Progress Notes (Signed)
Subjective: 1 Day Post-Op Procedure(s) (LRB): TOTAL KNEE ARTHROPLASTY REVISION (Right) STEROID INJECTION (Left) Patient reports pain as mild and moderate.   Patient seen in rounds by Dr. Wynelle Link.  She was in pain when she got up but could tell a big difference from before surgery. Patient is having problems with pain in the knee, requiring pain medications She already worked with today.  Plan is to go Home after hospital stay likely tomorrow afternoon.  Objective: Vital signs in last 24 hours: Temp:  [97.3 F (36.3 C)-99.4 F (37.4 C)] 98.3 F (36.8 C) (11/30 1500) Pulse Rate:  [66-88] 81 (11/30 1616) Resp:  [16] 16 (11/30 1500) BP: (120-142)/(60-111) 134/69 mmHg (11/30 1616) SpO2:  [93 %-97 %] 96 % (11/30 1500)  Intake/Output from previous day:  Intake/Output Summary (Last 24 hours) at 11/05/15 1651 Last data filed at 11/05/15 1300  Gross per 24 hour  Intake 2425.83 ml  Output    620 ml  Net 1805.83 ml    Intake/Output this shift: Total I/O In: 240 [P.O.:240] Out: -   Labs:  Recent Labs  11/05/15 0445  HGB 10.5*    Recent Labs  11/05/15 0445  WBC 10.6*  RBC 3.36*  HCT 31.5*  PLT 181    Recent Labs  11/05/15 0445  NA 136  K 3.7  CL 102  CO2 27  BUN 13  CREATININE 0.83  GLUCOSE 151*  CALCIUM 8.4*   No results for input(s): LABPT, INR in the last 72 hours.  EXAM General - Patient is Alert, Appropriate and Oriented Extremity - Neurovascular intact Sensation intact distally Dorsiflexion/Plantar flexion intact Dressing - dressing C/D/I Motor Function - intact, moving foot and toes well on exam.  Hemovac pulled without difficulty.  Past Medical History  Diagnosis Date  . Hypertension   . Hypothyroidism   . Chronic pain     lower back  . Bronchitis     history only  . Ulcer     hx  . Virus not detected 2010    postop, caught virus in hosp., respiratory symptoms, upon d/c to home, had allergic reaction /w plate size hives - not sure of  the cause    . Sinus infection     recent sinus infection, finished Zpak- 08/16/2013  . Female bladder prolapse   . Anemia     took iron for a while  . SVD (spontaneous vaginal delivery)     x 2  . Missed ab     x 6 - 2 surgery, 4 resolved on it's own  . Seasonal allergies   . Neuromuscular disorder ([place])     , left foot has some numbeness- uses cane  . PONV (postoperative nausea and vomiting)     states she had scop patch in past,states she sets off alarms when she is waking up, she "forgets to breathe"   . Allergy   . Blood transfusion without reported diagnosis   . Cataract   . Macular degeneration     wet- right eye  dry- left eye  . Hyperlipidemia   . Osteopenia   . Acquired left flat foot   . 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 Numbness     outer edge of heel left foot   . Tinnitus   . Imbalance   . Contusion of left hand   . Pneumonia   . Fracture of metatarsal bone     w nonunion   . Hallux rigidus   .  Urinary tract infection   . Urinary incontinence   . Acquired leg length discrepancy   . Metatarsalgia of left foot   . Trigger middle finger of left hand   . Allergic urticaria   . Shingles   . History of measles   . History of mumps   . History of rubella   . Menopause   . Chronic fatigue syndrome   . Arthritis     lower back, knees    Assessment/Plan: 1 Day Post-Op Procedure(s) (LRB): TOTAL KNEE ARTHROPLASTY REVISION (Right) STEROID INJECTION (Left) Principal Problem:   Failed total right knee replacement (HCC) Active Problems:   Failed total knee, right (HCC)  Estimated body mass index is 35.08 kg/(m^2) as calculated from the following:   Height as of this encounter: 5\' 3"  (1.6 m).   Weight as of this encounter: 89.812 kg (198 lb). Advance diet Up with therapy Plan for discharge tomorrow Discharge home with home health  DVT Prophylaxis - Eliquis Weight-Bearing as tolerated to right leg D/C O2 and Pulse OX and try on Room  Air  Arlee Muslim, PA-C Orthopaedic Surgery 11/05/2015, 4:51 PM

## 2015-11-05 NOTE — Evaluation (Signed)
Occupational Therapy Evaluation Patient Details Name: [name] [name] MRN: [id number] DOB: 12-21-1940 Today's Date: 11/05/2015    History of Present Illness R TKR revision with Aspiration and injection of L knee   Clinical Impression   This 74 year old female was admitted for the above.  Will plan to see pt one more time in acute setting to practice and review the shower transfer.  Family will assist with adls as needed at home    Follow Up Recommendations  No OT follow up;Supervision/Assistance - 24 hour    Equipment Recommendations  None recommended by OT    Recommendations for Other Services       Precautions / Restrictions Precautions Precautions: Knee;Fall Required Braces or Orthoses: Knee Immobilizer - Right Knee Immobilizer - Right: Discontinue once straight leg raise with < 10 degree lag Restrictions Other Position/Activity Restrictions: WBAT      Mobility Bed Mobility               General bed mobility comments: oob  Transfers   Equipment used: Rolling walker (2 wheeled) Transfers: Sit to/from Stand Sit to Stand: Mod assist         General transfer comment: cues for LE management and use of UEs to self assist    Balance                                            ADL Overall ADL's : Needs assistance/impaired     Grooming: Wash/dry face;Sitting;Set up   Upper Body Bathing: Set up;Sitting   Lower Body Bathing: Moderate assistance;Sit to/from stand   Upper Body Dressing : Set up;Sitting   Lower Body Dressing: Maximal assistance;Sit to/from stand   Toilet Transfer: Ambulation;BSC;RW;Moderate assistance (mod A for sit to stand; min A steps)   Toileting- Clothing Manipulation and Hygiene: Sit to/from stand;Moderate assistance (for sit to stand)         General ADL Comments: ambulated to bathroom and completed adl.  Educated on shower sequence, but pt did not practice today.  Family will assist with adls as needed--has AE  also     Vision     Perception     Praxis      Pertinent Vitals/Pain Pain Score: 5  Pain Location: R knee Pain Descriptors / Indicators: Aching Pain Intervention(s): Limited activity within patient's tolerance;Monitored during session;Premedicated before session;Repositioned;Ice applied     Hand Dominance     Extremity/Trunk Assessment Upper Extremity Assessment Upper Extremity Assessment: Overall WFL for tasks assessed           Communication Communication Communication: No difficulties   Cognition Arousal/Alertness: Awake/alert Behavior During Therapy: WFL for tasks assessed/performed Overall Cognitive Status: Within Functional Limits for tasks assessed                     General Comments       Exercises       Shoulder Instructions      Home Living Family/patient expects to be discharged to:: Private residence Living Arrangements: Spouse/significant other Available Help at Discharge: Family               Bathroom Shower/Tub: Walk-in Psychologist, prison and probation services: Standard     Home Equipment: Bedside commode;Shower seat;Walker - 2 wheels          Prior Functioning/Environment Level of Independence: Independent;Independent with assistive device(s)  OT Diagnosis: Acute pain;Generalized weakness   OT Problem List: Decreased strength;Decreased activity tolerance;Pain;Decreased knowledge of use of DME or AE   OT Treatment/Interventions: Self-care/ADL training;DME and/or AE instruction;Patient/family education    OT Goals(Current goals can be found in the care plan section) Acute Rehab OT Goals Patient Stated Goal: Resume previous lifestyle with decreased pain OT Goal Formulation: With patient Time For Goal Achievement: 11/12/15 Potential to Achieve Goals: Good ADL Goals Pt Will Perform Tub/Shower Transfer: Shower transfer;with min guard assist;ambulating;shower seat  OT Frequency: Min 2X/week   Barriers to D/C:             Co-evaluation              End of Session    Activity Tolerance: Patient tolerated treatment well Patient left: in chair;with call bell/phone within reach;with family/visitor present   Time: [id number] OT Time Calculation (min): 34 min Charges:  OT General Charges $OT Visit: 1 Procedure OT Evaluation $Initial OT Evaluation Tier I: 1 Procedure OT Treatments $Self Care/Home Management : 8-22 mins G-Codes:    Amine Adelson 11-21-2015, 2:24 PM Lesle Chris, OTR/L [phone] 2015-11-21

## 2015-11-05 NOTE — Care Management Note (Signed)
Case Management Note  Patient Details  Name: [name] [name] MRN: [id number] Date of Birth: [birth year]  Subjective/Objective:        S/p TOTAL KNEE ARTHROPLASTY REVISION             Action/Plan: Discharge planning, spoke with patient at bedside. Discussed need for [facility] PT at discharge. Patient has chosen [place]. Contacted [facility] for referral, no DME needs.   Expected Discharge Date:                  Expected Discharge Plan:  [place]  In-House Referral:  NA  Discharge planning Services  CM Consult  Post Acute Care Choice:  Home Health Choice offered to:  Patient  DME Arranged:  N/A DME Agency:  NA  HH Arranged:  PT HH Agency:  [facility]  Status of Service:  Completed, signed off  Medicare Important Message Given:    Date Medicare IM Given:    Medicare IM give by:    Date Additional Medicare IM Given:    Additional Medicare Important Message give by:     If discussed at [place] of Stay Meetings, dates discussed:    Additional Comments:  Guadalupe Maple, RN 11/05/2015, 10:31 AM

## 2015-11-05 NOTE — Op Note (Signed)
[name] [name], [name] [name]                    ACCOUNT NO.:  [id number]  MEDICAL RECORD NO.:  [id number]  LOCATION:  [id number]                         FACILITY:  [facility]  PHYSICIAN:  Gaynelle Arabian, M.D.    DATE OF BIRTH:  [birth year]  DATE OF PROCEDURE:  11/04/2015 DATE OF DISCHARGE:                              OPERATIVE REPORT   PREOPERATIVE DIAGNOSES: 1. Failed loose right total knee arthroplasty. 2. Symptomatic osteoarthritis, left knee.  POSTOPERATIVE DIAGNOSES: 1. Failed loose right total knee arthroplasty. 2. Symptomatic osteoarthritis, left knee.  PROCEDURE: 1. Right total knee revision. 2. Aspiration and cortisone injection of left knee.  SURGEON:  Gaynelle Arabian, MD  ASSISTANT:  Alexzandrew L. Perkins, PA-C  ANESTHESIA:  General.  ESTIMATED BLOOD LOSS:  Minimal.  DRAINS:  Hemovac x1.  COMPLICATIONS:  None.  CONDITION:  Stable to recovery.  BRIEF CLINICAL NOTE:  Ms. Pfuhl is a 74 year old female, who had a total knee arthroplasty done in 2013.  I saw her in consultation a few months ago.  We noted that she had loosening of the tibial component migration. In addition, she had an infection workup which was negative.  She presents now for right total knee arthroplasty revision.  In addition, she has a large effusion of the left knee with symptomatic arthritis. She has had cortisone and viscous supplements in the past and currently has significant pain and dysfunction.  She requested an aspiration injection in her left knee.  PROCEDURE IN DETAIL:  After successful administration of general anesthetic, a tourniquet was placed high on the right thigh.  Right lower extremity was prepped and draped in the usual sterile fashion. Extremities were wrapped in Esmarch and tourniquet inflated to 300 mmHg. A midline incision was made with a 10 blade through subcutaneous tissue to the extensor mechanism.  Fresh blade was used make a medial parapatellar arthrotomy.  Minimal fluid was present in  the joint and it was sent for stat Gram stain which was negative.  Soft tissue over the proximal medial tibia subperiosteally elevated to the joint line with a knife into the semimembranosus bursa with a Cobb elevator.  Soft tissue laterally was elevated with attention being paid to avoid the patellar tendon on tibial tubercle.  Patella was subluxed laterally, knee flexed 90 degrees.  The tibial polyethylene was removed from the tray.  Trays grossly loose.  There is also excessive posterior slope.  Retractors were placed and tibia subluxed forward.  I was able to remove the tray easily.  The extramedullary tibial alignment guide was then placed referencing proximally to medial aspect of tibial tubercle and distally along the second metatarsal axis and tibial crest.  I made my resection such that it would allow for a leveled resection to the posterior femur. Note that she had excessive slope so thus the posterior femur was well below normal level.  Resection was made with an oscillating saw.  The rest of the cement was then removed from the canal.  I then thoroughly irrigated tibial canal and reamed up to 13 mm for 13 mm cemented stem. Size 3 is the most appropriate size for the tibial component.  We then prepared proximally  with the modular drill.  The modular drill +13 x 30 stem for the size 3 MBT revision tray.  I also prepared proximally by broaching for a 29 sleeve.  We then addressed the femur.  The femoral components removed by disrupting the interface between the bone and metal.  The components subsequently removed with essentially no bone loss.  I removed the rest of the cement off the cut bone surfaces.  We then gained access to the femoral canal and thoroughly irrigated with saline.  I reamed the femoral canal up to 20 mm which had an excellent press-fit.  The 20 mm reamer was left in place to serve as our intramedullary cutting guide. Distal femoral cutting block was then  placed on the femur and I removed about 2 mm from each side.  The size 3 was the most appropriate femoral size.  The size 3 cutting block was placed in the +2 position effectively raising the stem and lowering the anterior aspect of the component to the anterior cortex of the femur.  A rotation was then marked at the epicondylar axis and confirmed with a spacer block to get a symmetric flexion space and 90 degrees of flexion.  The block was pinned in this position.  No bone was removed anteriorly, minimal bone posteriorly.  No bone removed from the chamfers.  We then placed the intercondylar block to make the intercondylar cut for the TC3 component. I did not need any augment when we were on the femur.  We then placed the trials.  On the tibial side, the size 3 MBT revision tray with 10 mm medial and lateral augment, a 29 mm sleeve and a 13 x 30 stem extension.  This was placed with excellent fit onto the tibia.  On the femoral side, the size 3 TC3 femur with a 20 x 75 stem extension in a +2 5 degree valgus position.  A 12.5 insert was placed and full extension was achieved with excellent varus-valgus and anterior- posterior balance throughout full range of motion.  Patelloplasty was performed to remove all the excess soft tissue over the patella.  The patella was well fixed with no wear and tracks normally.  We thus left the patella in place.  I then released the tourniquet for initial tourniquet time of 37 minutes.  It was held down for 8 minutes while the components were assembled on the back table.  Once the components were assembled on the back table, then the leg was rewrapped in Esmarch and tourniquet reinflated to 300 mmHg.  The cut bone surfaces were then prepared with pulsatile lavage.  The cement restrictor for the tibia is a size 4 and that was placed in the appropriate depth in the tibial canal.  The cut bone surfaces were further irrigated.  The cement was then mixed and  once ready for implantation, the tibial component which was size 3 MBT revision tray with 10 mm medial and lateral augments, 29 mm sleeve and 13 x 30 stem extension was impacted and extruded cement removed.  On the femoral side, we cemented distally with a press-fit stem.  Again, size 3 TC3 femur with a 20 x 75 stem extension in a +2, 5 degree valgus position.  It was impacted.  All extruded cement removed. Trial 12.5 inserts placed, knee held in full extension.  Then, the remainder of the extruded cement removed.  Once the cement was fully hardened, then the permanent 12.5 mm TC3 insert placed into the tibial  tray.  Wound was copiously irrigated with saline solution and the arthrotomy closed over Hemovac drain with running #1 V-Loc suture. Prior to closing this, I injected with 20 mL of Exparel mixed with 30 mL of saline and then additional 20 mL of 0.25% Marcaine was injected into the same tissues.  We released the tourniquet over second tourniquet time of 20 minutes.  Note that the initial time was 37 and the tourniquet was down for 8 and then up for an additional 20.  It was released and minimal bleeding was encountered.  Subcu was closed with interrupted 2-0 Vicryl over a second limb of a Hemovac drain. Subcuticular was closed with running 4-0 Monocryl.  Incisions cleaned and dried and Steri-Strips and a bulky sterile dressing applied.  She was placed into a knee immobilizer, awakened and transported to recovery in stable condition.  Note that a surgical assistant was a medical necessity for this procedure to do in a safe and expeditious manner.  Surgical assistant was necessary for retraction of vital ligaments and neurovascular structures as well as for proper positioning of the limb for safe removal of the implant as well as proper positioning for safe and accurate alignment of the new implants.     Gaynelle Arabian, M.D.     FA/MEDQ  D:  11/04/2015  T:  11/05/2015  Job:   [id number]

## 2015-11-05 NOTE — Evaluation (Signed)
Physical Therapy Evaluation Patient Details Name: [name] [name] MRN: [id number] DOB: 1941/09/13 Today's Date: 11/05/2015   History of Present Illness  R TKR revision with Aspiration and injection of L knee  Clinical Impression  Pt s/p R TKR revision presents with decreased R LE strength/ROM, ltd ROM L knee and post op pain limiting functional mobility.  Pt should progress to dc home with family assist and HHPT follow up.   Follow Up Recommendations Home health PT    Equipment Recommendations  None recommended by PT    Recommendations for Other Services OT consult     Precautions / Restrictions Precautions Precautions: Knee;Fall Required Braces or Orthoses: Knee Immobilizer - Right Knee Immobilizer - Right: Discontinue once straight leg raise with < 10 degree lag Restrictions Weight Bearing Restrictions: No Other Position/Activity Restrictions: WBAT      Mobility  Bed Mobility Overal bed mobility: Needs Assistance Bed Mobility: Supine to Sit     Supine to sit: Mod assist     General bed mobility comments: cues for sequence and use of L LE to self assist.  Physical assist to manage R LE, to bring trunk to upright and to scoot to EOB  Transfers Overall transfer level: Needs assistance Equipment used: Rolling walker (2 wheeled) Transfers: Sit to/from Stand Sit to Stand: Mod assist;+2 safety/equipment;+2 physical assistance;From elevated surface         General transfer comment: cues for LE management and use of UEs to self assist  Ambulation/Gait Ambulation/Gait assistance: Min assist;Mod assist;+2 safety/equipment Ambulation Distance (Feet): 15 Feet Assistive device: Rolling walker (2 wheeled) Gait Pattern/deviations: Step-to pattern;Decreased step length - right;Decreased step length - left;Shuffle;Trunk flexed Gait velocity: decr Gait velocity interpretation: Below normal speed for age/gender General Gait Details: cues for sequence, posture and position from  [employer]            Wheelchair Mobility    Modified Rankin (Stroke Patients Only)       Balance                                             Pertinent Vitals/Pain Pain Assessment: 0-10 Pain Score: 8  Pain Location: R knee Pain Descriptors / Indicators: Aching;Throbbing;Sore Pain Intervention(s): Limited activity within patient's tolerance;Monitored during session;Premedicated before session;Ice applied;Patient requesting pain meds-RN notified    Home Living Family/patient expects to be discharged to:: Private residence Living Arrangements: Spouse/significant other Available Help at Discharge: Family Type of Home: House Home Access: Stairs to enter Entrance Stairs-Rails: Right Entrance Stairs-Number of Steps: 5+1 (1 step is to get into high bed) Home Layout: Able to live on main level with bedroom/bathroom Home Equipment: Walker - 2 wheels;Cane - single point      Prior Function Level of Independence: Independent;Independent with assistive device(s)               Hand Dominance   Dominant Hand: Left    Extremity/Trunk Assessment   Upper Extremity Assessment: Overall WFL for tasks assessed           Lower Extremity Assessment: RLE deficits/detail;LLE deficits/detail RLE Deficits / Details: 2/5 quads with AAROM at knee -12 - 35 LLE Deficits / Details: Strength WFL with AROM at knee -5 - 75  Cervical / Trunk Assessment: Normal  Communication   Communication: No difficulties  Cognition Arousal/Alertness: Awake/alert Behavior During Therapy: WFL for tasks assessed/performed Overall  Cognitive Status: Within Functional Limits for tasks assessed                      General Comments      Exercises Total Joint Exercises Ankle Circles/Pumps: AROM;Both;15 reps;Supine Quad Sets: AROM;Both;10 reps;Supine Heel Slides: AAROM;Right;10 reps;Supine Straight Leg Raises: AAROM;Right;10 reps;Supine      Assessment/Plan     PT Assessment Patient needs continued PT services  PT Diagnosis Difficulty walking   PT Problem List Decreased strength;Decreased range of motion;Decreased activity tolerance;Decreased mobility;Decreased knowledge of use of DME;Pain;Decreased safety awareness  PT Treatment Interventions DME instruction;Gait training;Stair training;Functional mobility training;Therapeutic activities;Therapeutic exercise;Patient/family education   PT Goals (Current goals can be found in the Care Plan section) Acute Rehab PT Goals Patient Stated Goal: Resume previous lifestyle with decreased pain PT Goal Formulation: With patient Time For Goal Achievement: 11/08/15 Potential to Achieve Goals: Good    Frequency 7X/week   Barriers to discharge        Co-evaluation               End of Session Equipment Utilized During Treatment: Gait belt;Right knee immobilizer Activity Tolerance: Patient limited by fatigue;Patient limited by pain Patient left: in chair;with call bell/phone within reach Nurse Communication: Mobility status;Patient requests pain meds         Time: [id number] PT Time Calculation (min) (ACUTE ONLY): 30 min   Charges:   PT Evaluation $Initial PT Evaluation Tier I: 1 Procedure PT Treatments $Therapeutic Exercise: 8-22 mins   PT G Codes:        Derl Abalos 11-09-15, 10:27 AM

## 2015-11-06 LAB — CBC
HEMATOCRIT: 28.1 % — AB (ref 36.0–46.0)
Hemoglobin: 9.3 g/dL — ABNORMAL LOW (ref 12.0–15.0)
MCH: 32 pg (ref 26.0–34.0)
MCHC: 33.1 g/dL (ref 30.0–36.0)
MCV: 96.6 fL (ref 78.0–100.0)
PLATELETS: 153 10*3/uL (ref 150–400)
RBC: 2.91 MIL/uL — AB (ref 3.87–5.11)
RDW: 13.4 % (ref 11.5–15.5)
WBC: 10.1 10*3/uL (ref 4.0–10.5)

## 2015-11-06 LAB — BASIC METABOLIC PANEL
ANION GAP: 4 — AB (ref 5–15)
BUN: 12 mg/dL (ref 6–20)
CO2: 32 mmol/L (ref 22–32)
Calcium: 8.7 mg/dL — ABNORMAL LOW (ref 8.9–10.3)
Chloride: 104 mmol/L (ref 101–111)
Creatinine, Ser: 0.77 mg/dL (ref 0.44–1.00)
GFR calc Af Amer: >60 mL/min (ref >60)
GLUCOSE: 130 mg/dL — AB (ref 65–99)
POTASSIUM: 4.3 mmol/L (ref 3.5–5.1)
Sodium: 140 mmol/L (ref 135–145)

## 2015-11-06 MED ORDER — APIXABAN 2.5 MG PO TABS: 2.5000 mg | ORAL_TABLET | Freq: Two times a day (BID) | ORAL | Status: DC

## 2015-11-06 MED ORDER — OXYCODONE HCL 5 MG PO TABS: 5.0000 mg | ORAL_TABLET | ORAL | Status: DC | PRN

## 2015-11-06 MED ORDER — TRAMADOL HCL 50 MG PO TABS: 50.0000 mg | ORAL_TABLET | Freq: Four times a day (QID) | ORAL | Status: DC | PRN

## 2015-11-06 NOTE — Progress Notes (Signed)
Subjective: 2 Days Post-Op Procedure(s) (LRB): TOTAL KNEE ARTHROPLASTY REVISION (Right) STEROID INJECTION (Left) Patient reports pain as mild.   Patient seen in rounds for Dr. Wynelle Link. Patient is well, but has had some minor complaints of pain in the knee, requiring pain medications Patient is ready to go home this afternoon after therapy  Objective: Vital signs in last 24 hours: Temp:  [97.3 F (36.3 C)-98.4 F (36.9 C)] 98.4 F (36.9 C) (11/30 2050) Pulse Rate:  [74-88] 74 (12/01 0740) Resp:  [16] 16 (11/30 2050) BP: (120-134)/(62-72) 134/64 mmHg (12/01 0740) SpO2:  [95 %-97 %] 97 % (11/30 2050)  Intake/Output from previous day:  Intake/Output Summary (Last 24 hours) at 11/06/15 0904 Last data filed at 11/05/15 2237  Gross per 24 hour  Intake    600 ml  Output      0 ml  Net    600 ml    Labs:  Recent Labs  11/05/15 0445 11/06/15 0445  HGB 10.5* 9.3*    Recent Labs  11/05/15 0445 11/06/15 0445  WBC 10.6* 10.1  RBC 3.36* 2.91*  HCT 31.5* 28.1*  PLT 181 153    Recent Labs  11/05/15 0445 11/06/15 0445  NA 136 140  K 3.7 4.3  CL 102 104  CO2 27 32  BUN 13 12  CREATININE 0.83 0.77  GLUCOSE 151* 130*  CALCIUM 8.4* 8.7*   No results for input(s): LABPT, INR in the last 72 hours.  EXAM: General - Patient is Alert, Appropriate and Oriented Extremity - Neurovascular intact Sensation intact distally Dorsiflexion/Plantar flexion intact Incision - clean, dry, no drainage Motor Function - intact, moving foot and toes well on exam.   Assessment/Plan: 2 Days Post-Op Procedure(s) (LRB): TOTAL KNEE ARTHROPLASTY REVISION (Right) STEROID INJECTION (Left) Procedure(s) (LRB): TOTAL KNEE ARTHROPLASTY REVISION (Right) STEROID INJECTION (Left) Past Medical History  Diagnosis Date  . Hypertension   . Hypothyroidism   . Chronic pain     lower back  . Bronchitis     history only  . Ulcer     hx  . Virus not detected 2010    postop, caught virus in  hosp., respiratory symptoms, upon d/c to home, had allergic reaction /w plate size hives - not sure of the cause    . Sinus infection     recent sinus infection, finished Zpak- 08/16/2013  . Female bladder prolapse   . Anemia     took iron for a while  . SVD (spontaneous vaginal delivery)     x 2  . Missed ab     x 6 - 2 surgery, 4 resolved on it's own  . Seasonal allergies   . Neuromuscular disorder ([place])     , left foot has some numbeness- uses cane  . PONV (postoperative nausea and vomiting)     states she had scop patch in past,states she sets off alarms when she is waking up, she "forgets to breathe"   . Allergy   . Blood transfusion without reported diagnosis   . Cataract   . Macular degeneration     wet- right eye  dry- left eye  . Hyperlipidemia   . Osteopenia   . Acquired left flat foot   . 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 Numbness     outer edge of heel left foot   . Tinnitus   . Imbalance   . Contusion of left hand   . Pneumonia   . Fracture of  metatarsal bone     w nonunion   . Hallux rigidus   . Urinary tract infection   . Urinary incontinence   . Acquired leg length discrepancy   . Metatarsalgia of left foot   . Trigger middle finger of left hand   . Allergic urticaria   . Shingles   . History of measles   . History of mumps   . History of rubella   . Menopause   . Chronic fatigue syndrome   . Arthritis     lower back, knees   Principal Problem:   Failed total right knee replacement (HCC) Active Problems:   Failed total knee, right (HCC)  Estimated body mass index is 35.08 kg/(m^2) as calculated from the following:   Height as of this encounter: 5\' 3"  (1.6 m).   Weight as of this encounter: 89.812 kg (198 lb). Up with therapy Discharge home with home health Diet - Cardiac diet Follow up - in 2 weeks Activity - WBAT Disposition - Home Condition Upon Discharge - Stable D/C Meds - See DC Summary DVT Prophylaxis -  Eliquis  Arlee Muslim, PA-C Orthopaedic Surgery 11/06/2015, 9:04 AM

## 2015-11-06 NOTE — Progress Notes (Signed)
Physical Therapy Treatment Patient Details Name: [name] [name] MRN: [id number] DOB: 1941-04-04 Today's Date: 11/06/2015    History of Present Illness R TKR revision with Aspiration and injection of L knee    PT Comments    Pt progressing well with mobility.  Reviewed stairs and don/doff KI with pt and family.  Follow Up Recommendations  Home health PT     Equipment Recommendations  None recommended by PT    Recommendations for Other Services OT consult     Precautions / Restrictions Precautions Precautions: Knee;Fall Required Braces or Orthoses: Knee Immobilizer - Right Knee Immobilizer - Right: Discontinue once straight leg raise with < 10 degree lag Restrictions Weight Bearing Restrictions: No Other Position/Activity Restrictions: WBAT    Mobility  Bed Mobility Overal bed mobility: Needs Assistance Bed Mobility: Sit to Supine       Sit to supine: Min assist   General bed mobility comments: min assist for LE management with VC for sequence  Transfers Overall transfer level: Needs assistance Equipment used: Rolling walker (2 wheeled) Transfers: Sit to/from Stand Sit to Stand: Min guard         General transfer comment: cues for LE placement and use of UEs to self assist  Ambulation/Gait Ambulation/Gait assistance: Min guard;Supervision Ambulation Distance (Feet): 140 Feet Assistive device: Rolling walker (2 wheeled) Gait Pattern/deviations: Step-to pattern;Step-through pattern;Decreased step length - right;Decreased step length - left;Shuffle;Trunk flexed Gait velocity: decr   General Gait Details: min cues for sequence, posture and position from RW   Stairs Stairs: Yes Stairs assistance: Min assist Stair Management: One rail Right;No rails;Step to pattern;Backwards;Forwards;With crutches;With walker Number of Stairs: 8 General stair comments: 4 stairs with rail and crutch and single stair twice fwd and twice bkwd.  Cues for sequence and foot/crutch/RW  placement.  Family present and written instructions provided  Wheelchair Mobility    Modified Rankin (Stroke Patients Only)       Balance                                    Cognition Arousal/Alertness: Awake/alert Behavior During Therapy: WFL for tasks assessed/performed Overall Cognitive Status: Within Functional Limits for tasks assessed                      Exercises      General Comments        Pertinent Vitals/Pain Pain Assessment: 0-10 Pain Score: 5  Pain Location: R knee Pain Descriptors / Indicators: Aching;Sore Pain Intervention(s): Limited activity within patient's tolerance;Monitored during session;Premedicated before session;Ice applied    Home Living                      Prior Function            PT Goals (current goals can now be found in the care plan section) Acute Rehab PT Goals Patient Stated Goal: Resume previous lifestyle with decreased pain PT Goal Formulation: With patient Time For Goal Achievement: 11/08/15 Potential to Achieve Goals: Good Progress towards PT goals: Progressing toward goals    Frequency  7X/week    PT Plan Current plan remains appropriate    Co-evaluation             End of Session Equipment Utilized During Treatment: Gait belt;Right knee immobilizer Activity Tolerance: Patient tolerated treatment well Patient left: in bed;with call bell/phone within reach;with family/visitor present  Time: [id number] PT Time Calculation (min) (ACUTE ONLY): 48 min  Charges:  $Gait Training: 23-37 mins $Therapeutic Activity: 8-22 mins                    G Codes:      [name] [name] December 04, 2015, 4:18 PM

## 2015-11-06 NOTE — Discharge Summary (Signed)
Physician Discharge Summary   Patient ID: SURI TAFOLLA MRN: [id number] DOB/AGE: Sep 03, 1941 74 y.o.  Admit date: 11/04/2015 Discharge date: 11-06-2015  Primary Diagnosis:  1. Failed loose right total knee arthroplasty. 2. Symptomatic osteoarthritis, left knee.  Admission Diagnoses:  Past Medical History  Diagnosis Date  . Hypertension   . Hypothyroidism   . Chronic pain     lower back  . Bronchitis     history only  . Ulcer     hx  . Virus not detected 2010    postop, caught virus in hosp., respiratory symptoms, upon d/c to home, had allergic reaction /w plate size hives - not sure of the cause    . Sinus infection     recent sinus infection, finished Zpak- 08/16/2013  . Female bladder prolapse   . Anemia     took iron for a while  . SVD (spontaneous vaginal delivery)     x 2  . Missed ab     x 6 - 2 surgery, 4 resolved on it's own  . Seasonal allergies   . Neuromuscular disorder ([place])     , left foot has some numbeness- uses cane  . PONV (postoperative nausea and vomiting)     states she had scop patch in past,states she sets off alarms when she is waking up, she "forgets to breathe"   . Allergy   . Blood transfusion without reported diagnosis   . Cataract   . Macular degeneration     wet- right eye  dry- left eye  . Hyperlipidemia   . Osteopenia   . Acquired left flat foot   . 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 Numbness     outer edge of heel left foot   . Tinnitus   . Imbalance   . Contusion of left hand   . Pneumonia   . Fracture of metatarsal bone     w nonunion   . Hallux rigidus   . Urinary tract infection   . Urinary incontinence   . Acquired leg length discrepancy   . Metatarsalgia of left foot   . Trigger middle finger of left hand   . Allergic urticaria   . Shingles   . History of measles   . History of mumps   . History of rubella   . Menopause   . Chronic fatigue syndrome   . Arthritis     lower back, knees   Discharge  Diagnoses:   Principal Problem:   Failed total right knee replacement ([place]) Active Problems:   Failed total knee, right ([place])  Estimated body mass index is 35.08 kg/(m^2) as calculated from the following:   Height as of this encounter: '5\' 3"'  (1.6 m).   Weight as of this encounter: 89.812 kg (198 lb).  Procedure:  Procedure(s) (LRB): TOTAL KNEE ARTHROPLASTY REVISION (Right) STEROID INJECTION (Left)   Consults: None  HPI: Ms. Baik is a 74 year old female, who had a total knee arthroplasty done in 2013. I saw her in consultation a few months ago. We noted that she had loosening of the tibial component migration. In addition, she had an infection workup which was negative. She presents now for right total knee arthroplasty revision. In addition, she has a large effusion of the left knee with symptomatic arthritis. She has had cortisone and viscous supplements in the past and currently has significant pain and dysfunction. She requested an aspiration injection in her left knee.  Laboratory Data: Admission  on 11/04/2015  Component Date Value Ref Range Status  . Specimen Description 11/04/2015 SYNOVIAL RIGHT KNEE   Final  . Special Requests 11/04/2015 FLUID ON SWAB   Final  . Gram Stain 11/04/2015 PENDING   Incomplete  . Culture 11/04/2015    Final                   Value:NO ANAEROBES ISOLATED; CULTURE IN PROGRESS FOR 5 DAYS Performed at [facility]   . Report Status 11/04/2015 PENDING   Incomplete  . Specimen Description 11/04/2015 SYNOVIAL RIGHT KNEE   Final  . Special Requests 11/04/2015 FLUID ON SWAB   Final  . Gram Stain 11/04/2015    Final                   Value:NO WBC SEEN NO ORGANISMS SEEN   . Culture 11/04/2015    Final                   Value:NO GROWTH < 24 HOURS Performed at [facility]   . Report Status 11/04/2015 PENDING   Incomplete  . Specimen Description 11/04/2015 KNEE RIGHT   Final  . Special Requests 11/04/2015 NONE   Final  . Acid  Fast Smear 11/04/2015    Final                   Value:NO ACID FAST BACILLI SEEN Performed at [facility]   . Culture 11/04/2015    Final                   Value:CULTURE WILL BE EXAMINED FOR 6 WEEKS BEFORE ISSUING A FINAL REPORT Performed at [facility]   . Report Status 11/04/2015 PENDING   Incomplete  . WBC 11/05/2015 10.6* 4.0 - 10.5 K/uL Final  . RBC 11/05/2015 3.36* 3.87 - 5.11 MIL/uL Final  . Hemoglobin 11/05/2015 10.5* 12.0 - 15.0 g/dL Final  . HCT 11/05/2015 31.5* 36.0 - 46.0 % Final  . MCV 11/05/2015 93.8  78.0 - 100.0 fL Final  . MCH 11/05/2015 31.3  26.0 - 34.0 pg Final  . MCHC 11/05/2015 33.3  30.0 - 36.0 g/dL Final  . RDW 11/05/2015 13.0  11.5 - 15.5 % Final  . Platelets 11/05/2015 181  150 - 400 K/uL Final  . Sodium 11/05/2015 136  135 - 145 mmol/L Final  . Potassium 11/05/2015 3.7  3.5 - 5.1 mmol/L Final  . Chloride 11/05/2015 102  101 - 111 mmol/L Final  . CO2 11/05/2015 27  22 - 32 mmol/L Final  . Glucose, Bld 11/05/2015 151* 65 - 99 mg/dL Final  . BUN 11/05/2015 13  6 - 20 mg/dL Final  . Creatinine, Ser 11/05/2015 0.83  0.44 - 1.00 mg/dL Final  . Calcium 11/05/2015 8.4* 8.9 - 10.3 mg/dL Final  . GFR calc non Af Amer 11/05/2015 >60  >60 mL/min Final  . GFR calc Af Amer 11/05/2015 >60  >60 mL/min Final   Comment: (NOTE) The eGFR has been calculated using the CKD EPI equation. This calculation has not been validated in all clinical situations. eGFR's persistently <60 mL/min signify possible Chronic Kidney Disease.   . Anion gap 11/05/2015 7  5 - 15 Final  . WBC 11/06/2015 10.1  4.0 - 10.5 K/uL Final  . RBC 11/06/2015 2.91* 3.87 - 5.11 MIL/uL Final  . Hemoglobin 11/06/2015 9.3* 12.0 - 15.0 g/dL Final  . HCT 11/06/2015 28.1* 36.0 - 46.0 % Final  .  MCV 11/06/2015 96.6  78.0 - 100.0 fL Final  . MCH 11/06/2015 32.0  26.0 - 34.0 pg Final  . MCHC 11/06/2015 33.1  30.0 - 36.0 g/dL Final  . RDW 11/06/2015 13.4  11.5 - 15.5 % Final  . Platelets  11/06/2015 153  150 - 400 K/uL Final  . Sodium 11/06/2015 140  135 - 145 mmol/L Final  . Potassium 11/06/2015 4.3  3.5 - 5.1 mmol/L Final  . Chloride 11/06/2015 104  101 - 111 mmol/L Final  . CO2 11/06/2015 32  22 - 32 mmol/L Final  . Glucose, Bld 11/06/2015 130* 65 - 99 mg/dL Final  . BUN 11/06/2015 12  6 - 20 mg/dL Final  . Creatinine, Ser 11/06/2015 0.77  0.44 - 1.00 mg/dL Final  . Calcium 11/06/2015 8.7* 8.9 - 10.3 mg/dL Final  . GFR calc non Af Amer 11/06/2015 >60  >60 mL/min Final  . GFR calc Af Amer 11/06/2015 >60  >60 mL/min Final   Comment: (NOTE) The eGFR has been calculated using the CKD EPI equation. This calculation has not been validated in all clinical situations. eGFR's persistently <60 mL/min signify possible Chronic Kidney Disease.   Georgiann Hahn gap 11/06/2015 4* 5 - 15 Final  Hospital Outpatient Visit on 10/27/2015  Component Date Value Ref Range Status  . aPTT 10/27/2015 37  24 - 37 seconds Final   Comment:        IF BASELINE aPTT IS ELEVATED, SUGGEST PATIENT RISK ASSESSMENT BE USED TO DETERMINE APPROPRIATE ANTICOAGULANT THERAPY.   . WBC 10/27/2015 5.5  4.0 - 10.5 K/uL Final  . RBC 10/27/2015 3.91  3.87 - 5.11 MIL/uL Final  . Hemoglobin 10/27/2015 12.3  12.0 - 15.0 g/dL Final  . HCT 10/27/2015 36.7  36.0 - 46.0 % Final  . MCV 10/27/2015 93.9  78.0 - 100.0 fL Final  . MCH 10/27/2015 31.5  26.0 - 34.0 pg Final  . MCHC 10/27/2015 33.5  30.0 - 36.0 g/dL Final  . RDW 10/27/2015 13.1  11.5 - 15.5 % Final  . Platelets 10/27/2015 202  150 - 400 K/uL Final  . Sodium 10/27/2015 139  135 - 145 mmol/L Final  . Potassium 10/27/2015 3.6  3.5 - 5.1 mmol/L Final  . Chloride 10/27/2015 99* 101 - 111 mmol/L Final  . CO2 10/27/2015 31  22 - 32 mmol/L Final  . Glucose, Bld 10/27/2015 99  65 - 99 mg/dL Final  . BUN 10/27/2015 20  6 - 20 mg/dL Final  . Creatinine, Ser 10/27/2015 1.06* 0.44 - 1.00 mg/dL Final  . Calcium 10/27/2015 9.1  8.9 - 10.3 mg/dL Final  . Total Protein  10/27/2015 7.2  6.5 - 8.1 g/dL Final  . Albumin 10/27/2015 4.1  3.5 - 5.0 g/dL Final  . AST 10/27/2015 28  15 - 41 U/L Final  . ALT 10/27/2015 20  14 - 54 U/L Final  . Alkaline Phosphatase 10/27/2015 96  38 - 126 U/L Final  . Total Bilirubin 10/27/2015 0.9  0.3 - 1.2 mg/dL Final  . GFR calc non Af Amer 10/27/2015 51* >60 mL/min Final  . GFR calc Af Amer 10/27/2015 59* >60 mL/min Final   Comment: (NOTE) The eGFR has been calculated using the CKD EPI equation. This calculation has not been validated in all clinical situations. eGFR's persistently <60 mL/min signify possible Chronic Kidney Disease.   . Anion gap 10/27/2015 9  5 - 15 Final  . Prothrombin Time 10/27/2015 13.5  11.6 - 15.2 seconds Final  .  INR 10/27/2015 1.01  0.00 - 1.49 Final  . ABO/RH(D) 10/27/2015 O POS   Final  . Antibody Screen 10/27/2015 NEG   Final  . Sample Expiration 10/27/2015 11/07/2015   Final  . Extend sample reason 10/27/2015 NO TRANSFUSIONS OR PREGNANCY IN THE PAST 3 MONTHS   Final  . Color, Urine 10/27/2015 YELLOW  YELLOW Final  . APPearance 10/27/2015 CLEAR  CLEAR Final  . Specific Gravity, Urine 10/27/2015 1.009  1.005 - 1.030 Final  . pH 10/27/2015 6.0  5.0 - 8.0 Final  . Glucose, UA 10/27/2015 NEGATIVE  NEGATIVE mg/dL Final  . Hgb urine dipstick 10/27/2015 NEGATIVE  NEGATIVE Final  . Bilirubin Urine 10/27/2015 SMALL* NEGATIVE Final  . Ketones, ur 10/27/2015 NEGATIVE  NEGATIVE mg/dL Final  . Protein, ur 10/27/2015 NEGATIVE  NEGATIVE mg/dL Final  . Nitrite 10/27/2015 NEGATIVE  NEGATIVE Final  . Leukocytes, UA 10/27/2015 NEGATIVE  NEGATIVE Final   MICROSCOPIC NOT DONE ON URINES WITH NEGATIVE PROTEIN, BLOOD, LEUKOCYTES, NITRITE, OR GLUCOSE <1000 mg/dL.  Marland Kitchen MRSA, PCR 10/27/2015 NEGATIVE  NEGATIVE Final  . Staphylococcus aureus 10/27/2015 POSITIVE* NEGATIVE Final   Comment:        The Xpert SA Assay (FDA approved for NASAL specimens in patients over 17 years of age), is one component of a comprehensive  surveillance program.  Test performance has been validated by [facility] for patients greater than or equal to 31 year old. It is not intended to diagnose infection nor to guide or monitor treatment.   . ABO/RH(D) 10/27/2015 O POS   Final  Office Visit on 09/12/2015  Component Date Value Ref Range Status  . Iron 09/12/2015 87  45 - 160 ug/dL Final  . Ferritin 09/12/2015 90  10 - 291 ng/mL Final     X-Rays:Mm Screening Breast Tomo Bilateral  10/29/2015  CLINICAL DATA:  Screening. EXAM: DIGITAL SCREENING BILATERAL MAMMOGRAM WITH 3D TOMO WITH CAD COMPARISON:  Previous exam(s). ACR Breast Density Category b: There are scattered areas of fibroglandular density. FINDINGS: There are no findings suspicious for malignancy. Images were processed with CAD. IMPRESSION: No mammographic evidence of malignancy. A result letter of this screening mammogram will be mailed directly to the patient. RECOMMENDATION: Screening mammogram in one year. (Code:SM-B-01Y) BI-RADS CATEGORY  1: Negative. Electronically Signed   By: Altamese Cabal M.D.   On: 10/29/2015 09:43    EKG: Orders placed or performed during the hospital encounter of 10/27/15  . EKG 12-Lead  . EKG 12-Lead     Hospital Course: [name] [name] is a 74 y.o. who was [facility] They were brought to the operating room on 11/04/2015 and underwent Procedure(s): TOTAL KNEE ARTHROPLASTY REVISION STEROID INJECTION.  Patient tolerated the procedure well and was later transferred to the recovery room and then to the orthopaedic floor for postoperative care.  They were given PO and IV analgesics for pain control following their surgery.  They were given 24 hours of postoperative antibiotics of  Anti-infectives    Start     Dose/Rate Route Frequency Ordered Stop   11/04/15 2000  ceFAZolin (ANCEF) IVPB 2 g/50 mL premix     2 g 100 mL/hr over 30 Minutes Intravenous Every 6 hours 11/04/15 1703 11/05/15 0252   11/04/15 1128  ceFAZolin  (ANCEF) IVPB 2 g/50 mL premix     2 g 100 mL/hr over 30 Minutes Intravenous On call to O.R. 11/04/15 1128 11/04/15 1335     and started on DVT prophylaxis in the  form of Eliquis.   PT and OT were ordered for total joint protocol.  Discharge planning consulted to help with postop disposition and equipment needs.  Patient had a tough night on the evening of surgery with pain but could already tell a difference.  They started to get up OOB with therapy on day one. Hemovac drain was pulled without difficulty.  Continued to work with therapy into day two.  Dressing was changed on day two and the incision was healing well. Patient was seen in rounds and was ready to go home.  Discharge home with home health Diet - Cardiac diet Follow up - in 2 weeks Activity - WBAT Disposition - Home Condition Upon Discharge - Stable D/C Meds - See DC Summary DVT Prophylaxis - Eliquis   Discharge Instructions    Call MD / Call 911    Complete by:  As directed   If you experience chest pain or shortness of breath, CALL 911 and be transported to the hospital emergency room.  If you develope a fever above 101 F, pus (white drainage) or increased drainage or redness at the wound, or calf pain, call your surgeon's office.     Change dressing    Complete by:  As directed   Change dressing daily with sterile 4 x 4 inch gauze dressing and apply TED hose. Do not submerge the incision under water.     Constipation Prevention    Complete by:  As directed   Drink plenty of fluids.  Prune juice may be helpful.  You may use a stool softener, such as Colace (over the counter) 100 mg twice a day.  Use MiraLax (over the counter) for constipation as needed.     Diet - low sodium heart healthy    Complete by:  As directed      Discharge instructions    Complete by:  As directed   Pick up stool softner and laxative for home use following surgery while on pain medications. Do not submerge incision under water. Please use good  hand washing techniques while changing dressing each day. May shower starting three days after surgery. Please use a clean towel to pat the incision dry following showers. Continue to use ice for pain and swelling after surgery. Do not use any lotions or creams on the incision until instructed by your surgeon.  Take Eliquis twice a day for two and a half more weeks, then discontinue Eliquis. Once the patient has completed the Eliquis, they may resume the 81 mg Aspirin.  Postoperative Constipation Protocol  Constipation - defined medically as fewer than three stools per week and severe constipation as less than one stool per week.  One of the most common issues patients have following surgery is constipation.  Even if you have a regular bowel pattern at home, your normal regimen is likely to be disrupted due to multiple reasons following surgery.  Combination of anesthesia, postoperative narcotics, change in appetite and fluid intake all can affect your bowels.  In order to avoid complications following surgery, here are some recommendations in order to help you during your recovery period.  Colace (docusate) - Pick up an over-the-counter form of Colace or another stool softener and take twice a day as long as you are requiring postoperative pain medications.  Take with a full glass of water daily.  If you experience loose stools or diarrhea, hold the colace until you stool forms back up.  If your symptoms do not get better  within 1 week or if they get worse, check with your doctor.  Dulcolax (bisacodyl) - Pick up over-the-counter and take as directed by the product packaging as needed to assist with the movement of your bowels.  Take with a full glass of water.  Use this product as needed if not relieved by Colace only.   MiraLax (polyethylene glycol) - Pick up over-the-counter to have on hand.  MiraLax is a solution that will increase the amount of water in your bowels to assist with bowel  movements.  Take as directed and can mix with a glass of water, juice, soda, coffee, or tea.  Take if you go more than two days without a movement. Do not use MiraLax more than once per day. Call your doctor if you are still constipated or irregular after using this medication for 7 days in a row.  If you continue to have problems with postoperative constipation, please contact the office for further assistance and recommendations.  If you experience "the worst abdominal pain ever" or develop nausea or vomiting, please contact the office immediatly for further recommendations for treatment.     Do not put a pillow under the knee. Place it under the heel.    Complete by:  As directed      Do not sit on low chairs, stoools or toilet seats, as it may be difficult to get up from low surfaces    Complete by:  As directed      Driving restrictions    Complete by:  As directed   No driving until released by the physician.     Increase activity slowly as tolerated    Complete by:  As directed      Lifting restrictions    Complete by:  As directed   No lifting until released by the physician.     Patient may shower    Complete by:  As directed   You may shower without a dressing once there is no drainage.  Do not wash over the wound.  If drainage remains, do not shower until drainage stops.     TED hose    Complete by:  As directed   Use stockings (TED hose) for 3 weeks on both leg(s).  You may remove them at night for sleeping.     Weight bearing as tolerated    Complete by:  As directed   Laterality:  right  Extremity:  Lower            Medication List    STOP taking these medications        aspirin EC 81 MG tablet     Biotin 5000 MCG Caps     CALCIUM 1200 PO     etodolac 400 MG tablet  Commonly known as:  LODINE     FLAX SEED OIL PO     multivitamin with minerals Tabs tablet     PRESERVISION/LUTEIN PO     vitamin B-12 1000 MCG tablet  Commonly known as:  CYANOCOBALAMIN      VITAMIN C PO      TAKE these medications        apixaban 2.5 MG Tabs tablet  Commonly known as:  ELIQUIS  Take 1 tablet (2.5 mg total) by mouth every 12 (twelve) hours. Take Eliquis twice a day for two and a half more weeks, then discontinue Eliquis. Once the patient has completed the Eliquis, they may resume the 81 mg Aspirin.Marland Kitchen  atorvastatin 20 MG tablet  Commonly known as:  LIPITOR  Take 20 mg by mouth daily.     buPROPion 100 MG 12 hr tablet  Commonly known as:  WELLBUTRIN SR  100 mg daily.     carvedilol 6.25 MG tablet  Commonly known as:  COREG  Take 6.25 mg by mouth 2 (two) times daily with a meal.     diazepam 5 MG tablet  Commonly known as:  VALIUM  Take 4 mg by mouth every 8 (eight) hours as needed for muscle spasms (for back spasms after surgery).     EPIPEN 2-PAK 0.3 mg/0.3 mL Soaj injection  Generic drug:  EPINEPHrine     furosemide 40 MG tablet  Commonly known as:  LASIX  Take 40 mg by mouth daily.     gabapentin 600 MG tablet  Commonly known as:  NEURONTIN  Take 600 mg by mouth 3 (three) times daily.     levothyroxine 75 MCG tablet  Commonly known as:  SYNTHROID, LEVOTHROID  Take 75 mcg by mouth daily.     loratadine 10 MG tablet  Commonly known as:  CLARITIN  Take 10 mg by mouth daily as needed for allergies.     Magnesium 400 MG Caps  Take 400 mg by mouth daily.     methadone 5 MG tablet  Commonly known as:  DOLOPHINE  Take 5 mg by mouth every 8 (eight) hours.     nystatin-triamcinolone ointment  Commonly known as:  MYCOLOG  APPLY TO AFFECTED AREA TWICE A DAY.     oxyCODONE 5 MG immediate release tablet  Commonly known as:  Oxy IR/ROXICODONE  Take 1-3 tablets (5-15 mg total) by mouth every 4 (four) hours as needed for moderate pain or severe pain.     senna 8.6 MG tablet  Commonly known as:  SENOKOT  Take 2-3 tablets by mouth 2 (two) times daily. 3 tablets in the morning, 2 tablets in the evening     traMADol 50 MG tablet  Commonly  known as:  ULTRAM  Take 1-2 tablets (50-100 mg total) by mouth every 6 (six) hours as needed (mild pain).           Follow-up Information    Follow up with [facility].   Why:  physcial therapy   Contact information:   [address] [place] [address] [phone]       Follow up with Gearlean Alf, MD. Schedule an appointment as soon as possible for a visit on 11/18/2015.   Specialty:  Orthopedic Surgery   Why:  Call office at [phone] to setup appointment on Tuesday 11/18/15 with Dr. Wynelle Link.   Contact information:   [address] [place] [address] [phone]       Signed: Arlee Muslim, PA-C Orthopaedic Surgery 11/06/2015, 9:15 AM

## 2015-11-06 NOTE — Progress Notes (Signed)
Occupational Therapy Treatment Patient Details Name: [name] [name] MRN: [id number] DOB: 22-Nov-1941 Today's Date: 11/06/2015    History of present illness R TKR revision with Aspiration and injection of L knee   OT comments  All education completed.  No further OT is needed at this time  Follow Up Recommendations  No OT follow up;Supervision/Assistance - 24 hour    Equipment Recommendations  None recommended by OT    Recommendations for Other Services      Precautions / Restrictions Precautions Precautions: Knee;Fall Required Braces or Orthoses: Knee Immobilizer - Right Knee Immobilizer - Right: Discontinue once straight leg raise with < 10 degree lag Restrictions Weight Bearing Restrictions: No Other Position/Activity Restrictions: WBAT       Mobility Bed Mobility               General bed mobility comments: oob  Transfers   Equipment used: Rolling walker (2 wheeled) Transfers: Sit to/from Stand Sit to Stand: Min assist         General transfer comment: cues for LE placement    Balance                                   ADL       Grooming: Oral care;Wash/dry face;Standing;Supervision/safety (min guard for face)                   Toilet Transfer: Minimal assistance;Ambulation;BSC;RW       Tub/ Shower Transfer: Minimal assistance;Ambulation;Walk-in shower     General ADL Comments: pt wanted to perform grooming tasks and use commode while she was in the bathroom practicing shower transfer.  Pt needed min guard assist when washing face as eyes were closed and was a little less steady.  pt practiced lifting walker into shower stall once legs were in; she will need to do this to safely access seat.  Educated on removing KI once seated and reapplying after patting dry, before standing      Vision                     Perception     Praxis      Cognition   Behavior During Therapy: WFL for tasks assessed/performed Overall  Cognitive Status: Within Functional Limits for tasks assessed                       Extremity/Trunk Assessment               Exercises     Shoulder Instructions       General Comments      Pertinent Vitals/ Pain       Pain Score: 6  Pain Location: R knee Pain Descriptors / Indicators: Aching Pain Intervention(s): Limited activity within patient's tolerance;Monitored during session;Premedicated before session;Repositioned;Ice applied  Home Living                                          Prior Functioning/Environment              Frequency       Progress Toward Goals  OT Goals(current goals can now be found in the care plan section)  Progress towards OT goals: Goals met/education completed, patient discharged from [facility]  Co-evaluation                 End of Session CPM Right Knee CPM Right Knee: Off   Activity Tolerance Patient tolerated treatment well   Patient Left in chair;with call bell/phone within reach;with chair alarm set   Nurse Communication          Time: [phone] OT Time Calculation (min): 31 min  Charges: OT General Charges $OT Visit: 1 Procedure OT Treatments $Self Care/Home Management : 23-37 mins  Teyon Odette 11/06/2015, 10:40 AM  Lesle Chris, OTR/L [phone] 11/06/2015

## 2015-11-06 NOTE — Discharge Instructions (Addendum)

## 2015-11-06 NOTE — Progress Notes (Signed)
Physical Therapy Treatment Patient Details Name: [name] [name] MRN: [id number] DOB: [birth year] Today's Date: 11/06/2015    History of Present Illness R TKR revision with Aspiration and injection of L knee    PT Comments    Pt progressing well with mobility.  Will address stairs this pm.  Follow Up Recommendations  Home health PT     Equipment Recommendations  None recommended by PT    Recommendations for Other Services OT consult     Precautions / Restrictions Precautions Precautions: Knee;Fall Required Braces or Orthoses: Knee Immobilizer - Right Knee Immobilizer - Right: Discontinue once straight leg raise with < 10 degree lag Restrictions Weight Bearing Restrictions: No Other Position/Activity Restrictions: WBAT    Mobility  Bed Mobility Overal bed mobility: Needs Assistance Bed Mobility: Supine to Sit     Supine to sit: Min assist     General bed mobility comments: min assist for LE management with VC for sequence  Transfers Overall transfer level: Needs assistance Equipment used: Rolling walker (2 wheeled) Transfers: Sit to/from Stand Sit to Stand: Min guard;From elevated surface         General transfer comment: cues for LE placement and use of UEs to self assist  Ambulation/Gait Ambulation/Gait assistance: Min guard Ambulation Distance (Feet): 123 Feet Assistive device: Rolling walker (2 wheeled) Gait Pattern/deviations: Step-to pattern;Step-through pattern;Decreased step length - right;Decreased step length - left;Shuffle;Trunk flexed Gait velocity: decr   General Gait Details: cues for sequence, posture and position from [employer]            Wheelchair Mobility    Modified Rankin (Stroke Patients Only)       Balance                                    Cognition Arousal/Alertness: Awake/alert Behavior During Therapy: WFL for tasks assessed/performed Overall Cognitive Status: Within Functional Limits for tasks  assessed                      Exercises Total Joint Exercises Ankle Circles/Pumps: AROM;Both;15 reps;Supine Quad Sets: AROM;Both;Supine;15 reps Heel Slides: AAROM;Right;Supine;15 reps Straight Leg Raises: AAROM;Right;Supine;15 reps Goniometric ROM: AAROM at R knee -10 - 45    General Comments        Pertinent Vitals/Pain Pain Assessment: 0-10 Pain Score: 5  Pain Location: R knee Pain Descriptors / Indicators: Aching;Sore Pain Intervention(s): Limited activity within patient's tolerance;Monitored during session;Premedicated before session;Ice applied    Home Living                      Prior Function            PT Goals (current goals can now be found in the care plan section) Acute Rehab PT Goals Patient Stated Goal: Resume previous lifestyle with decreased pain PT Goal Formulation: With patient Time For Goal Achievement: 11/08/15 Potential to Achieve Goals: Good Progress towards PT goals: Progressing toward goals    Frequency  7X/week    PT Plan Current plan remains appropriate    Co-evaluation             End of Session Equipment Utilized During Treatment: Gait belt;Right knee immobilizer Activity Tolerance: Patient tolerated treatment well Patient left: in chair;with call bell/phone within reach     Time: [id number] PT Time Calculation (min) (ACUTE ONLY): 34 min  Charges:  $Gait Training: 8-22 mins $  Therapeutic Exercise: 8-22 mins                    G Codes:      Thanh Mottern 11-15-15, 11:08 AM

## 2015-11-07 DIAGNOSIS — Z4733 Aftercare following explantation of knee joint prosthesis: Secondary | ICD-10-CM | POA: Diagnosis not present

## 2015-11-07 DIAGNOSIS — M1712 Unilateral primary osteoarthritis, left knee: Secondary | ICD-10-CM | POA: Diagnosis not present

## 2015-11-07 DIAGNOSIS — H3530 Unspecified macular degeneration: Secondary | ICD-10-CM | POA: Diagnosis not present

## 2015-11-07 DIAGNOSIS — I10 Essential (primary) hypertension: Secondary | ICD-10-CM | POA: Diagnosis not present

## 2015-11-07 DIAGNOSIS — Z96651 Presence of right artificial knee joint: Secondary | ICD-10-CM | POA: Diagnosis not present

## 2015-11-07 DIAGNOSIS — M2142 Flat foot [pes planus] (acquired), left foot: Secondary | ICD-10-CM | POA: Diagnosis not present

## 2015-11-08 LAB — BODY FLUID CULTURE
CULTURE: NO GROWTH
Gram Stain: NONE SEEN

## 2015-11-09 LAB — ANAEROBIC CULTURE: Gram Stain: NONE SEEN

## 2015-11-10 DIAGNOSIS — Z96651 Presence of right artificial knee joint: Secondary | ICD-10-CM | POA: Diagnosis not present

## 2015-11-10 DIAGNOSIS — H3530 Unspecified macular degeneration: Secondary | ICD-10-CM | POA: Diagnosis not present

## 2015-11-10 DIAGNOSIS — M2142 Flat foot [pes planus] (acquired), left foot: Secondary | ICD-10-CM | POA: Diagnosis not present

## 2015-11-10 DIAGNOSIS — M1712 Unilateral primary osteoarthritis, left knee: Secondary | ICD-10-CM | POA: Diagnosis not present

## 2015-11-10 DIAGNOSIS — Z4733 Aftercare following explantation of knee joint prosthesis: Secondary | ICD-10-CM | POA: Diagnosis not present

## 2015-11-10 DIAGNOSIS — I10 Essential (primary) hypertension: Secondary | ICD-10-CM | POA: Diagnosis not present

## 2015-11-12 ENCOUNTER — Encounter (HOSPITAL_COMMUNITY): Payer: Self-pay | Admitting: Emergency Medicine

## 2015-11-12 ENCOUNTER — Emergency Department (HOSPITAL_COMMUNITY)
Admission: EM | Admit: 2015-11-12 | Discharge: 2015-11-12 | Disposition: A | Discharge status: Home / Self Care | Payer: Medicare Other | Attending: Emergency Medicine | Admitting: Emergency Medicine

## 2015-11-12 ENCOUNTER — Emergency Department (HOSPITAL_COMMUNITY): Payer: Medicare Other

## 2015-11-12 DIAGNOSIS — Z8742 Personal history of other diseases of the female genital tract: Secondary | ICD-10-CM | POA: Insufficient documentation

## 2015-11-12 DIAGNOSIS — G8929 Other chronic pain: Secondary | ICD-10-CM | POA: Diagnosis not present

## 2015-11-12 DIAGNOSIS — Z471 Aftercare following joint replacement surgery: Secondary | ICD-10-CM | POA: Diagnosis not present

## 2015-11-12 DIAGNOSIS — Z862 Personal history of diseases of the blood and blood-forming organs and certain disorders involving the immune mechanism: Secondary | ICD-10-CM | POA: Insufficient documentation

## 2015-11-12 DIAGNOSIS — E785 Hyperlipidemia, unspecified: Secondary | ICD-10-CM | POA: Diagnosis not present

## 2015-11-12 DIAGNOSIS — Z8781 Personal history of (healed) traumatic fracture: Secondary | ICD-10-CM | POA: Diagnosis not present

## 2015-11-12 DIAGNOSIS — E039 Hypothyroidism, unspecified: Secondary | ICD-10-CM | POA: Insufficient documentation

## 2015-11-12 DIAGNOSIS — Y998 Other external cause status: Secondary | ICD-10-CM | POA: Insufficient documentation

## 2015-11-12 DIAGNOSIS — M25551 Pain in right hip: Secondary | ICD-10-CM

## 2015-11-12 DIAGNOSIS — R51 Headache: Secondary | ICD-10-CM | POA: Diagnosis not present

## 2015-11-12 DIAGNOSIS — Z96659 Presence of unspecified artificial knee joint: Secondary | ICD-10-CM | POA: Diagnosis not present

## 2015-11-12 DIAGNOSIS — I10 Essential (primary) hypertension: Secondary | ICD-10-CM | POA: Diagnosis not present

## 2015-11-12 DIAGNOSIS — Z872 Personal history of diseases of the skin and subcutaneous tissue: Secondary | ICD-10-CM | POA: Diagnosis not present

## 2015-11-12 DIAGNOSIS — S59901A Unspecified injury of right elbow, initial encounter: Secondary | ICD-10-CM | POA: Diagnosis not present

## 2015-11-12 DIAGNOSIS — W19XXXA Unspecified fall, initial encounter: Secondary | ICD-10-CM

## 2015-11-12 DIAGNOSIS — Z96651 Presence of right artificial knee joint: Secondary | ICD-10-CM | POA: Diagnosis not present

## 2015-11-12 DIAGNOSIS — R9431 Abnormal electrocardiogram [ECG] [EKG]: Secondary | ICD-10-CM | POA: Diagnosis not present

## 2015-11-12 DIAGNOSIS — S0990XA Unspecified injury of head, initial encounter: Secondary | ICD-10-CM | POA: Insufficient documentation

## 2015-11-12 DIAGNOSIS — Y9389 Activity, other specified: Secondary | ICD-10-CM | POA: Diagnosis not present

## 2015-11-12 DIAGNOSIS — M25511 Pain in right shoulder: Secondary | ICD-10-CM | POA: Diagnosis not present

## 2015-11-12 DIAGNOSIS — W1830XA Fall on same level, unspecified, initial encounter: Secondary | ICD-10-CM | POA: Diagnosis not present

## 2015-11-12 DIAGNOSIS — Z780 Asymptomatic menopausal state: Secondary | ICD-10-CM | POA: Diagnosis not present

## 2015-11-12 DIAGNOSIS — Z8709 Personal history of other diseases of the respiratory system: Secondary | ICD-10-CM | POA: Insufficient documentation

## 2015-11-12 DIAGNOSIS — Z91040 Latex allergy status: Secondary | ICD-10-CM | POA: Insufficient documentation

## 2015-11-12 DIAGNOSIS — Z79899 Other long term (current) drug therapy: Secondary | ICD-10-CM | POA: Insufficient documentation

## 2015-11-12 DIAGNOSIS — Y9289 Other specified places as the place of occurrence of the external cause: Secondary | ICD-10-CM | POA: Diagnosis not present

## 2015-11-12 DIAGNOSIS — Z9849 Cataract extraction status, unspecified eye: Secondary | ICD-10-CM | POA: Diagnosis not present

## 2015-11-12 DIAGNOSIS — S4991XA Unspecified injury of right shoulder and upper arm, initial encounter: Secondary | ICD-10-CM | POA: Insufficient documentation

## 2015-11-12 DIAGNOSIS — Z7902 Long term (current) use of antithrombotics/antiplatelets: Secondary | ICD-10-CM | POA: Diagnosis not present

## 2015-11-12 DIAGNOSIS — Z8619 Personal history of other infectious and parasitic diseases: Secondary | ICD-10-CM | POA: Insufficient documentation

## 2015-11-12 DIAGNOSIS — S8991XA Unspecified injury of right lower leg, initial encounter: Secondary | ICD-10-CM | POA: Insufficient documentation

## 2015-11-12 DIAGNOSIS — Z8701 Personal history of pneumonia (recurrent): Secondary | ICD-10-CM | POA: Insufficient documentation

## 2015-11-12 DIAGNOSIS — M25521 Pain in right elbow: Secondary | ICD-10-CM | POA: Diagnosis not present

## 2015-11-12 DIAGNOSIS — Z8669 Personal history of other diseases of the nervous system and sense organs: Secondary | ICD-10-CM | POA: Diagnosis not present

## 2015-11-12 DIAGNOSIS — M1990 Unspecified osteoarthritis, unspecified site: Secondary | ICD-10-CM | POA: Insufficient documentation

## 2015-11-12 DIAGNOSIS — S79911A Unspecified injury of right hip, initial encounter: Secondary | ICD-10-CM | POA: Insufficient documentation

## 2015-11-12 DIAGNOSIS — R079 Chest pain, unspecified: Secondary | ICD-10-CM | POA: Diagnosis not present

## 2015-11-12 LAB — BASIC METABOLIC PANEL WITH GFR
Anion gap: 9 (ref 5–15)
BUN: 12 mg/dL (ref 6–20)
CO2: 29 mmol/L (ref 22–32)
Calcium: 8.8 mg/dL — ABNORMAL LOW (ref 8.9–10.3)
Chloride: 99 mmol/L — ABNORMAL LOW (ref 101–111)
Creatinine, Ser: 0.78 mg/dL (ref 0.44–1.00)
GFR calc Af Amer: >60 mL/min
GFR calc non Af Amer: >60 mL/min
Glucose, Bld: 120 mg/dL — ABNORMAL HIGH (ref 65–99)
Potassium: 4.3 mmol/L (ref 3.5–5.1)
Sodium: 137 mmol/L (ref 135–145)

## 2015-11-12 LAB — CBC WITH DIFFERENTIAL/PLATELET
Basophils Absolute: 0.1 K/uL (ref 0.0–0.1)
Basophils Relative: 1 %
Eosinophils Absolute: 0.4 K/uL (ref 0.0–0.7)
Eosinophils Relative: 4 %
HCT: 27.4 % — ABNORMAL LOW (ref 36.0–46.0)
Hemoglobin: 8.8 g/dL — ABNORMAL LOW (ref 12.0–15.0)
Lymphocytes Relative: 14 %
Lymphs Abs: 1.4 K/uL (ref 0.7–4.0)
MCH: 30.9 pg (ref 26.0–34.0)
MCHC: 32.1 g/dL (ref 30.0–36.0)
MCV: 96.1 fL (ref 78.0–100.0)
Monocytes Absolute: 1.1 K/uL — ABNORMAL HIGH (ref 0.1–1.0)
Monocytes Relative: 12 %
Neutro Abs: 6.8 K/uL (ref 1.7–7.7)
Neutrophils Relative %: 69 %
Platelets: 371 K/uL (ref 150–400)
RBC: 2.85 MIL/uL — ABNORMAL LOW (ref 3.87–5.11)
RDW: 13.7 % (ref 11.5–15.5)
WBC: 9.7 K/uL (ref 4.0–10.5)

## 2015-11-12 LAB — I-STAT CG4 LACTIC ACID, ED: Lactic Acid, Venous: 0.95 mmol/L (ref 0.5–2.0)

## 2015-11-12 MED ORDER — HYDROMORPHONE HCL 1 MG/ML IJ SOLN
1.0000 mg | Freq: Once | INTRAMUSCULAR | Status: AC
  Administered 2015-11-12: 1 mg via INTRAVENOUS
  Filled 2015-11-12: qty 1

## 2015-11-12 MED ORDER — ONDANSETRON HCL 4 MG/2ML IJ SOLN
4.0000 mg | Freq: Once | INTRAMUSCULAR | Status: AC
  Administered 2015-11-12: 4 mg via INTRAVENOUS
  Filled 2015-11-12: qty 2

## 2015-11-12 NOTE — ED Provider Notes (Signed)
CSN: [id number]     Arrival date & time 11/12/15  1301 History   First MD Initiated Contact with Patient 11/12/15 1308     Chief Complaint  Patient presents with  . Fall     (Consider location/radiation/quality/duration/timing/severity/associated sxs/prior Treatment) HPI   [name] [name] is a 75 y.o F with a pmhx of chronic pain, HTN, s/p total knee replacement 11/04/2015 who presents to the emergency department after a fall. Patient states this morning she was bent over towel drying her hair after a shower when she fell on her right side. Patient states that she does not remember becoming dizzy before the fall but is unsure of how she fell. Denies loss of consciousness. Patient states she did strike her head on the floor. Patient is on Eliquis. Patient states that she remained on the floor for approximately 20 min before EMS arrived. Pt now c/o headache, R hip pain, R shoulder pain and R elbow pain. Denies confusion, blurry vision, CP, SOB, dizziness, nausea, vomiting. Pt takes home methadone and oxycodone for chronic pain.  Of note, pt has been running fevers up to 102 over the last 4 days. Pt has been taking tylenol for this with minimal relief. Denies significant increase in pani in knee, obvious redness or warmth of knee. Pt able to ambulate with walker.   Past Medical History  Diagnosis Date  . Hypertension   . Hypothyroidism   . Chronic pain     lower back  . Bronchitis     history only  . Ulcer     hx  . Virus not detected 2010    postop, caught virus in hosp., respiratory symptoms, upon d/c to home, had allergic reaction /w plate size hives - not sure of the cause    . Sinus infection     recent sinus infection, finished Zpak- 08/16/2013  . Female bladder prolapse   . Anemia     took iron for a while  . SVD (spontaneous vaginal delivery)     x 2  . Missed ab     x 6 - 2 surgery, 4 resolved on it's own  . Seasonal allergies   . Neuromuscular disorder ([place])     , left foot has  some numbeness- uses cane  . PONV (postoperative nausea and vomiting)     states she had scop patch in past,states she sets off alarms when she is waking up, she "forgets to breathe"   . Allergy   . Blood transfusion without reported diagnosis   . Cataract   . Macular degeneration     wet- right eye  dry- left eye  . Hyperlipidemia   . Osteopenia   . Acquired left flat foot   . 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 Numbness     outer edge of heel left foot   . Tinnitus   . Imbalance   . Contusion of left hand   . Pneumonia   . Fracture of metatarsal bone     w nonunion   . Hallux rigidus   . Urinary tract infection   . Urinary incontinence   . Acquired leg length discrepancy   . Metatarsalgia of left foot   . Trigger middle finger of left hand   . Allergic urticaria   . Shingles   . History of measles   . History of mumps   . History of rubella   . Menopause   . Chronic fatigue syndrome   .  Arthritis     lower back, knees   Past Surgical History  Procedure Laterality Date  . Hammer toe surgery  4/12    lt foot-2-3  . Appendectomy    . Metatarsal osteotomy  3/12    lt  . Thumb fusion  2008    rt  . Carpal tunnel release      both  . Knee arthroscopy  2007    both  . Orif toe fracture  11/04/2011    Procedure: OPEN REDUCTION INTERNAL FIXATION (ORIF) METATARSAL (TOE) FRACTURE;  Surgeon: Wylene Simmer, MD;  Location: [facility];  Service: Orthopedics;  Laterality: Left;  left revision orif 1st metatarsal with autograft from left calcaneous and left 2nd-3rd metatarsal weil osteotomy  . 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 Dilation and curettage of uterus      x2  . Total knee arthroplasty  08/29/2012    Procedure: TOTAL KNEE ARTHROPLASTY;  Surgeon: Hessie Dibble, MD;  Location: [facility];  Service: Orthopedics;  Laterality: Right;  . Other surgical history  05/08/13    hardware removed from left foot  . Bunionectomy  03/02/11 and 11/04/11    left  foot x2  . Joint replacement      R- TKA- 2013  . Tubal ligation    . Total shoulder arthroplasty Right 05/09/2014    Procedure: TOTAL SHOULDER ARTHROPLASTY;  Surgeon: Nita Sells, MD;  Location: [facility];  Service: Orthopedics;  Laterality: Right;  Right shoulder arthroplasty  . Lumbar laminectomy  05/1999    L4-L5  . Back surgery  09,00,10,99, 6/14,9/14    x6 surgeries on back -Spinal Fusin L4-L5, Lumbar Fusion L1-S1  . Breast surgery      left cyst- benign  . Abdominal hysterectomy Bilateral 10/07/2014    Procedure: TOTAL ABDOMINAL HYSTERECTOMY WITH BILATERAL SALPINGO OOPHORECTOMY AND HALBANS CULDOELASTY ;  Surgeon: Lyman Speller, MD;  Location: [facility];  Service: Gynecology;  Laterality: Bilateral;  Miller primary/Silva assist  4 hours total  . Anterior and posterior repair N/A 10/07/2014    Procedure: ANTERIOR (CYSTOCELE) AND POSTERIOR REPAIR (RECTOCELE);  Surgeon: Lyman Speller, MD;  Location: [facility];  Service: Gynecology;  Laterality: N/A;  . Bladder suspension N/A 10/07/2014    Procedure: TRANSVAGINAL TAPE (TVT) EXACT PROCEDURE;  Surgeon: Lyman Speller, MD;  Location: [facility];  Service: Gynecology;  Laterality: N/A;  . Cystoscopy N/A 10/07/2014    Procedure: CYSTOSCOPY;  Surgeon: Lyman Speller, MD;  Location: [facility];  Service: Gynecology;  Laterality: N/A;  . Removal of two osteomas      on forehead-2008  . Total knee revision Right 11/04/2015    Procedure: TOTAL KNEE ARTHROPLASTY REVISION;  Surgeon: Gaynelle Arabian, MD;  Location: [facility];  Service: Orthopedics;  Laterality: Right;  . Steriod injection Left 11/04/2015    Procedure: STEROID INJECTION;  Surgeon: Gaynelle Arabian, MD;  Location: [facility];  Service: Orthopedics;  Laterality: Left;   Family History  Problem Relation Age of Onset  . Hyperlipidemia Father   . Hypertension Father   . Heart attack Father 58    deceased  . Colon cancer Neg Hx   . Esophageal cancer Neg Hx   . Stomach cancer Neg Hx     . Rectal cancer Neg Hx    Social History  Substance Use Topics  . Smoking status: Never Smoker   . Smokeless tobacco: Never Used     Comment: occ wine  .  Alcohol Use: 0.6 oz/week    1 Standard drinks or equivalent per week     Comment: glass of wine occas   OB History    Gravida Para Term Preterm AB TAB SAB Ectopic Multiple Living   9 2        2      Review of Systems  All other systems reviewed and are negative.     Allergies  Oxycodone; Flexeril; Other; Red dye; Adhesive; Ciprofloxacin; Pravastatin; Tylenol; Dilaudid; Latex; Lisinopril; Methocarbamol; Morphine and related; and Sulfa antibiotics  Home Medications   Prior to Admission medications   Medication Sig Start Date End Date Taking? Authorizing Provider  acetaminophen (TYLENOL) 325 MG tablet Take 650 mg by mouth every 6 (six) hours as needed for fever.   Yes Historical Provider, MD  apixaban (ELIQUIS) 2.5 MG TABS tablet Take 1 tablet (2.5 mg total) by mouth every 12 (twelve) hours. Take Eliquis twice a day for two and a half more weeks, then discontinue Eliquis. Once the patient has completed the Eliquis, they may resume the 81 mg Aspirin.. 11/06/15  Yes Arlee Muslim, PA-C  atorvastatin (LIPITOR) 20 MG tablet Take 20 mg by mouth daily.   Yes Historical Provider, MD  beta carotene w/minerals (OCUVITE) tablet Take 1 tablet by mouth daily.   Yes Historical Provider, MD  buPROPion (WELLBUTRIN SR) 100 MG 12 hr tablet Take 100 mg by mouth daily.  09/09/15  Yes Historical Provider, MD  carvedilol (COREG) 6.25 MG tablet Take 6.25 mg by mouth 2 (two) times daily with a meal.  09/25/14  Yes Historical Provider, MD  diazepam (VALIUM) 5 MG tablet Take 4 mg by mouth every 8 (eight) hours as needed for muscle spasms (for back spasms after surgery).    Yes Historical Provider, MD  furosemide (LASIX) 40 MG tablet Take 40 mg by mouth daily.    Yes Historical Provider, MD  gabapentin (NEURONTIN) 600 MG tablet Take 600 mg by mouth 3 (three)  times daily.    Yes Historical Provider, MD  levothyroxine (SYNTHROID, LEVOTHROID) 75 MCG tablet Take 75 mcg by mouth daily.    Yes Historical Provider, MD  loratadine (CLARITIN) 10 MG tablet Take 10 mg by mouth daily as needed for allergies.   Yes Historical Provider, MD  Magnesium 400 MG CAPS Take 400 mg by mouth at bedtime.    Yes Historical Provider, MD  methadone (DOLOPHINE) 5 MG tablet Take 5 mg by mouth every 8 (eight) hours.    Yes Historical Provider, MD  oxyCODONE (OXY IR/ROXICODONE) 5 MG immediate release tablet Take 1-3 tablets (5-15 mg total) by mouth every 4 (four) hours as needed for moderate pain or severe pain. 11/06/15  Yes Arlee Muslim, PA-C  senna (SENOKOT) 8.6 MG tablet Take 2-3 tablets by mouth 2 (two) times daily. 3 tablets in the morning, 2 tablets in the evening   Yes Historical Provider, MD  EPIPEN 2-PAK 0.3 MG/0.3ML SOAJ injection  11/21/14   Historical Provider, MD  nystatin-triamcinolone ointment (MYCOLOG) APPLY TO AFFECTED AREA TWICE A DAY. Patient taking differently: APPLY TO AFFECTED AREA TWICE A DAY as needed for fungal infection 08/04/15   Megan Salon, MD  traMADol (ULTRAM) 50 MG tablet Take 1-2 tablets (50-100 mg total) by mouth every 6 (six) hours as needed (mild pain). 11/06/15   Arlee Muslim, PA-C   BP 131/58 mmHg  Pulse 58  Temp(Src) 98.8 F (37.1 C) (Oral)  Resp 16  SpO2 98%  LMP 12/06/2000 Physical Exam  Constitutional:  She is oriented to person, place, and time. She appears well-developed and well-nourished. No distress.  HENT:  Head: Normocephalic and atraumatic.  Mouth/Throat: Oropharynx is clear and moist. No oropharyngeal exudate.  Eyes: Conjunctivae and EOM are normal. Pupils are equal, round, and reactive to light. Right eye exhibits no discharge. Left eye exhibits no discharge. No scleral icterus.  Neck: Neck supple.  Cardiovascular: Normal rate, regular rhythm, normal heart sounds and intact distal pulses.  Exam reveals no gallop and no  friction rub.   No murmur heard. Pulmonary/Chest: Effort normal and breath sounds normal. No respiratory distress. She has no wheezes. She has no rales. She exhibits no tenderness.  Abdominal: Soft. Bowel sounds are normal. She exhibits no distension. There is no tenderness. There is no guarding.  Musculoskeletal: Normal range of motion. She exhibits no edema.  Negative hawkins test, negative Neer's test, mild TTP over shoulder and elbow. No pain with flexion/extension/abduction/adduction internal or external rotation. No obvious bony deformity.  TTP over bony process of R hip. Negative SLR. NO obvious bony deformity.  See photos of R knee. No varus or valgus laxity.   No midline spinal tenderness.      Lymphadenopathy:    She has no cervical adenopathy.  Neurological: She is alert and oriented to person, place, and time.  Skin: Skin is warm and dry. No rash noted. She is not diaphoretic. No erythema. No pallor.  Psychiatric: She has a normal mood and affect. Her behavior is normal.  Nursing note and vitals reviewed.       ED Course  Procedures (including critical care time) Labs Review Labs Reviewed  BASIC METABOLIC PANEL - Abnormal; Notable for the following:    Chloride 99 (*)    Glucose, Bld 120 (*)    Calcium 8.8 (*)    All other components within normal limits  CBC WITH DIFFERENTIAL/PLATELET - Abnormal; Notable for the following:    RBC 2.85 (*)    Hemoglobin 8.8 (*)    HCT 27.4 (*)    Monocytes Absolute 1.1 (*)    All other components within normal limits  I-STAT CG4 LACTIC ACID, ED    Imaging Review Dg Chest 2 View  11/12/2015  CLINICAL DATA:  [place] today with pain EXAM: CHEST  2 VIEW COMPARISON:  Chest x-ray of 08/23/2013 FINDINGS: Linear scarring remains stable at both lung bases. No active infiltrate or effusion is seen. The lungs are slightly hyperaerated. Mild cardiomegaly is stable. Mediastinal and hilar contours are unchanged. There are mild degenerative  changes in the lower thoracic spine. A right shoulder prosthesis is present. IMPRESSION: Slight hyper aeration with stable bibasilar linear scarring. Mild cardiomegaly. Electronically Signed   By: Ivar Drape M.D.   On: 11/12/2015 14:31   Dg Shoulder Right  11/12/2015  CLINICAL DATA:  Fall while getting out of shower today with right shoulder pain. Right shoulder replacement June 2015. EXAM: RIGHT SHOULDER - 2+ VIEW COMPARISON:  05/09/2014 FINDINGS: Right shoulder arthroplasty is intact and unchanged. Single surgical clip projected over the glenoid. No acute fracture or dislocation. Minimal degenerate change of the glenohumeral joint. Remainder the exam is within normal. IMPRESSION: No acute findings.  Right shoulder arthroplasty unchanged. Electronically Signed   By: Marin Olp M.D.   On: 11/12/2015 14:31   Dg Elbow Complete Right  11/12/2015  CLINICAL DATA:  74 year old female who fell at home in the bathroom today. Pain. Initial encounter. EXAM: RIGHT ELBOW - COMPLETE 3+ VIEW COMPARISON:  None. FINDINGS: Bone  mineralization is within normal limits for age. No elbow joint effusion identified. Mild joint space loss and subchondral sclerosis. Alignment about the right elbow is preserved. Small chronic medial supracondylar ossific fragment. No acute fracture. IMPRESSION: No acute fracture or dislocation identified about the right elbow. Electronically Signed   By: Genevie Ann M.D.   On: 11/12/2015 14:30   Ct Head Wo Contrast  11/12/2015  CLINICAL DATA:  Pain following fall.  Recent syncopal episodes EXAM: CT HEAD WITHOUT CONTRAST TECHNIQUE: Contiguous axial images were obtained from the base of the skull through the vertex without intravenous contrast. COMPARISON:  February 12, 2007 FINDINGS: The ventricles are normal in size and configuration for age. There is no intracranial mass, hemorrhage, extra-axial fluid collection, or midline shift. Gray-white compartments appear normal. No acute infarct evident. Bony  calvarium appears intact. Mastoids on the left are clear. There is opacification of several inferior mastoid air cells on the right. There is no mastoid air-fluid level. There is mucosal thickening in the left maxillary antrum. No intraorbital lesions are identified. IMPRESSION: No intracranial mass, hemorrhage, or extra-axial fluid collection. No acute infarct evident. Mild inferior right mastoid air cell disease. Moderate mucosal thickening left maxillary antrum. Electronically Signed   By: Lowella Grip III M.D.   On: 11/12/2015 14:44   Dg Knee Complete 4 Views Right  11/12/2015  CLINICAL DATA:  Status post total knee replacement 1 week prior. Fall earlier today EXAM: RIGHT KNEE - COMPLETE 4+ VIEW COMPARISON:  None. FINDINGS: Frontal, lateral, and bilateral oblique views were obtained. There is soft tissue swelling anteriorly. There is evidence of total knee replacement with prosthetic components appearing well-seated. No acute fracture or dislocation. There is a sizable joint effusion with scattered foci of air in the suprapatellar bursa. No erosive change or bony destruction is apparent. IMPRESSION: Prosthetic components appear well seated. No acute fracture or dislocation. No bony destruction. There is extensive soft tissue swelling anteriorly with sizable joint effusion with air within the suprapatellar bursa in the effusion. These changes may be of postoperative etiology. A degree of hemorrhage and/or infection, however, must be of concern. Arthrocentesis potentially may be warranted in this circumstance. Electronically Signed   By: Lowella Grip III M.D.   On: 11/12/2015 14:32   Dg Hip Unilat With Pelvis 2-3 Views Right  11/12/2015  CLINICAL DATA:  [place] today with lateral right hip pain EXAM: DG HIP (WITH OR WITHOUT PELVIS) 2-3V RIGHT COMPARISON:  None. FINDINGS: No acute fracture is seen. Only mild degenerative joint disease of the hips is noted. There is a well corticated bony density  adjacent to the right femoral greater trochanter most consistent with prior trauma. The pelvic rami are intact. The SI joints are corticated. Hardware for fusion of the lower lumbar spine is noted. 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  No acute abnormality. Electronically Signed   By: Ivar Drape M.D.   On: 11/12/2015 14:30   I have personally reviewed and evaluated these images and lab results as part of my medical decision-making.   EKG Interpretation   Date/Time:  Wednesday November 12 2015 13:19:42 EST Ventricular Rate:  78 PR Interval:  166 QRS Duration: 99 QT Interval:  401 QTC Calculation: 457 R Axis:   62 Text Interpretation:  Sinus rhythm Borderline T abnormalities, anterior  leads Confirmed by Christy Gentles  MD, DONALD ([id number]) on 11/12/2015 1:41:06 PM      MDM   Final diagnoses:  Fall  Hip pain, right  74 year old female with a recent right total knee replacement, on Eliquis presents to the ED for a fall. Patient struck her head on the floor as well as her right hip, right shoulder, right elbow and right knee. No loss of consciousness. Will CT head and obtain x-ray images of affected bones. Patient appears well in ED, nontoxic. Alert and oriented 4. We will manage pain in the emergency department. Patient also reports intermittent fevers over the last several days which she's been taken Tylenol for. Patient is afebrile in the emergency department. We will obtain basic labs to evaluate for infection. Surgical incision site on right knee appears to be healing well. No erythema or warmth. No drainage noted.  CT head negative for acute abnormality. X-rays of shoulder elbow and hip are all negative for acute fracture. X-ray of right knee reveals extensive soft tissue swelling with sizable joint effusion with air within the suprapatellar bursa in the effusion. These are most likely postoperative etiology. However infection cannot be excluded. No  leukocytosis.  Spoke with Dr. Wynelle Link who performed the patient's total knee replacement. He reviewed the x-ray as well as the images that were uploaded into the chart. He states that these are postoperative changes and he is not concerned about further infection. Patient may follow in his office tomorrow if her symptoms do not improve.  Discussed the results with the patient and the patient's family. Patient will be discharged home to follow up with her orthopedic surgeon as needed or if symptoms worsen. Patient is agreeable. Patient has home medication that she takes for pain. Patient is hemodynamic stable and ready for discharge.  Patient was discussed with and seen by Dr. Kandis Mannan who agrees with the treatment plan.      Dondra Spry [place], PA-C 11/13/15 1727  Ripley Fraise, MD 11/13/15 [phone]

## 2015-11-12 NOTE — ED Notes (Signed)
From home via [facility], fell with right sided pain, recent revision of Right knee replacement, CMS intact, VSS, no LOC, A/O X4

## 2015-11-12 NOTE — ED Provider Notes (Signed)
Patient gave verbal permission to utilize photo for medical documentation only The image was not stored on any personal device    Pt with fall today now with pain in right knee/hip Pt otherwise stable PA will consult ortho for further guidance Pt stable at this time   Ripley Fraise, MD 11/12/15 [phone]

## 2015-11-12 NOTE — Discharge Instructions (Signed)
Cryotherapy Cryotherapy is when you put ice on your injury. Ice helps lessen pain and puffiness (swelling) after an injury. Ice works the best when you start using it in the first 24 to 48 hours after an injury. HOME CARE  Put a dry or damp towel between the ice pack and your skin.  You may press gently on the ice pack.  Leave the ice on for no more than 10 to 20 minutes at a time.  Check your skin after 5 minutes to make sure your skin is okay.  Rest at least 20 minutes between ice pack uses.  Stop using ice when your skin loses feeling (numbness).  Do not use ice on someone who cannot tell you when it hurts. This includes small children and people with memory problems (dementia). GET HELP RIGHT AWAY IF:  You have white spots on your skin.  Your skin turns blue or pale.  Your skin feels waxy or hard.  Your puffiness gets worse. MAKE SURE YOU:   Understand these instructions.  Will watch your condition.  Will get help right away if you are not doing well or get worse.   This information is not intended to replace advice given to you by your health care provider. Make sure you discuss any questions you have with your health care provider.   Document Released: 05/10/2008 Document Revised: 02/14/2012 Document Reviewed: 07/15/2011 Elsevier Interactive Patient Education 2016 [employer]  Hip Pain Your hip is the joint between your upper legs and your lower pelvis. The bones, cartilage, tendons, and muscles of your hip joint perform a lot of work each day supporting your body weight and allowing you to move around. Hip pain can range from a minor ache to severe pain in one or both of your hips. Pain may be felt on the inside of the hip joint near the groin, or the outside near the buttocks and upper thigh. You may have swelling or stiffness as well.  HOME CARE INSTRUCTIONS   Take medicines only as directed by your health care provider.  Apply ice to the injured area:  Put  ice in a plastic bag.  Place a towel between your skin and the bag.  Leave the ice on for 15-20 minutes at a time, 3-4 times a day.  Keep your leg raised (elevated) when possible to lessen swelling.  Avoid activities that cause pain.  Follow specific exercises as directed by your health care provider.  Sleep with a pillow between your legs on your most comfortable side.  Record how often you have hip pain, the location of the pain, and what it feels like. SEEK MEDICAL CARE IF:   You are unable to put weight on your leg.  Your hip is red or swollen or very tender to touch.  Your pain or swelling continues or worsens after 1 week.  You have increasing difficulty walking.  You have a fever. SEEK IMMEDIATE MEDICAL CARE IF:   You have fallen.  You have a sudden increase in pain and swelling in your hip. MAKE SURE YOU:   Understand these instructions.  Will watch your condition.  Will get help right away if you are not doing well or get worse.   This information is not intended to replace advice given to you by your health care provider. Make sure you discuss any questions you have with your health care provider.   Document Released: 05/12/2010 Document Revised: 12/13/2014 Document Reviewed: 07/19/2013 Elsevier Interactive Patient Education  2016 [employer]  Musculoskeletal Pain Musculoskeletal pain is muscle and boney aches and pains. These pains can occur in any part of the body. Your caregiver may treat you without knowing the cause of the pain. They may treat you if blood or urine tests, X-rays, and other tests were normal.  CAUSES There is often not a definite cause or reason for these pains. These pains may be caused by a type of germ (virus). The discomfort may also come from overuse. Overuse includes working out too hard when your body is not fit. Boney aches also come from weather changes. Bone is sensitive to atmospheric pressure changes. HOME CARE INSTRUCTIONS     Ask when your test results will be ready. Make sure you get your test results.  Only take over-the-counter or prescription medicines for pain, discomfort, or fever as directed by your caregiver. If you were given medications for your condition, do not drive, operate machinery or power tools, or sign legal documents for 24 hours. Do not drink alcohol. Do not take sleeping pills or other medications that may interfere with treatment.  Continue all activities unless the activities cause more pain. When the pain lessens, slowly resume normal activities. Gradually increase the intensity and duration of the activities or exercise.  During periods of severe pain, bed rest may be helpful. Lay or sit in any position that is comfortable.  Putting ice on the injured area.  Put ice in a bag.  Place a towel between your skin and the bag.  Leave the ice on for 15 to 20 minutes, 3 to 4 times a day.  Follow up with your caregiver for continued problems and no reason can be found for the pain. If the pain becomes worse or does not go away, it may be necessary to repeat tests or do additional testing. Your caregiver may need to look further for a possible cause. SEEK IMMEDIATE MEDICAL CARE IF:  You have pain that is getting worse and is not relieved by medications.  You develop chest pain that is associated with shortness or breath, sweating, feeling sick to your stomach (nauseous), or throw up (vomit).  Your pain becomes localized to the abdomen.  You develop any new symptoms that seem different or that concern you. MAKE SURE YOU:   Understand these instructions.  Will watch your condition.  Will get help right away if you are not doing well or get worse.   This information is not intended to replace advice given to you by your health care provider. Make sure you discuss any questions you have with your health care provider.   Follow up with your orthopedic surgeon as needed or if symptoms  worsen. Return to the Emergency Department if you experience severe increase in your pain, fever, vomiting, chest pain, or shortness of breath.

## 2015-11-13 DIAGNOSIS — Z4733 Aftercare following explantation of knee joint prosthesis: Secondary | ICD-10-CM | POA: Diagnosis not present

## 2015-11-13 DIAGNOSIS — Z96651 Presence of right artificial knee joint: Secondary | ICD-10-CM | POA: Diagnosis not present

## 2015-11-13 DIAGNOSIS — M2142 Flat foot [pes planus] (acquired), left foot: Secondary | ICD-10-CM | POA: Diagnosis not present

## 2015-11-13 DIAGNOSIS — I10 Essential (primary) hypertension: Secondary | ICD-10-CM | POA: Diagnosis not present

## 2015-11-13 DIAGNOSIS — H3530 Unspecified macular degeneration: Secondary | ICD-10-CM | POA: Diagnosis not present

## 2015-11-13 DIAGNOSIS — M1712 Unilateral primary osteoarthritis, left knee: Secondary | ICD-10-CM | POA: Diagnosis not present

## 2015-11-14 DIAGNOSIS — Z4733 Aftercare following explantation of knee joint prosthesis: Secondary | ICD-10-CM | POA: Diagnosis not present

## 2015-11-14 DIAGNOSIS — I10 Essential (primary) hypertension: Secondary | ICD-10-CM | POA: Diagnosis not present

## 2015-11-14 DIAGNOSIS — M2142 Flat foot [pes planus] (acquired), left foot: Secondary | ICD-10-CM | POA: Diagnosis not present

## 2015-11-14 DIAGNOSIS — M1712 Unilateral primary osteoarthritis, left knee: Secondary | ICD-10-CM | POA: Diagnosis not present

## 2015-11-14 DIAGNOSIS — Z96651 Presence of right artificial knee joint: Secondary | ICD-10-CM | POA: Diagnosis not present

## 2015-11-14 DIAGNOSIS — H3530 Unspecified macular degeneration: Secondary | ICD-10-CM | POA: Diagnosis not present

## 2015-11-17 DIAGNOSIS — Z96651 Presence of right artificial knee joint: Secondary | ICD-10-CM | POA: Diagnosis not present

## 2015-11-17 DIAGNOSIS — H3530 Unspecified macular degeneration: Secondary | ICD-10-CM | POA: Diagnosis not present

## 2015-11-17 DIAGNOSIS — M1712 Unilateral primary osteoarthritis, left knee: Secondary | ICD-10-CM | POA: Diagnosis not present

## 2015-11-17 DIAGNOSIS — Z4733 Aftercare following explantation of knee joint prosthesis: Secondary | ICD-10-CM | POA: Diagnosis not present

## 2015-11-17 DIAGNOSIS — I10 Essential (primary) hypertension: Secondary | ICD-10-CM | POA: Diagnosis not present

## 2015-11-17 DIAGNOSIS — M2142 Flat foot [pes planus] (acquired), left foot: Secondary | ICD-10-CM | POA: Diagnosis not present

## 2015-11-18 DIAGNOSIS — Z471 Aftercare following joint replacement surgery: Secondary | ICD-10-CM | POA: Diagnosis not present

## 2015-11-18 DIAGNOSIS — Z96651 Presence of right artificial knee joint: Secondary | ICD-10-CM | POA: Diagnosis not present

## 2015-11-19 DIAGNOSIS — Z79891 Long term (current) use of opiate analgesic: Secondary | ICD-10-CM | POA: Diagnosis not present

## 2015-11-19 DIAGNOSIS — M25561 Pain in right knee: Secondary | ICD-10-CM | POA: Diagnosis not present

## 2015-11-19 DIAGNOSIS — M961 Postlaminectomy syndrome, not elsewhere classified: Secondary | ICD-10-CM | POA: Diagnosis not present

## 2015-11-19 DIAGNOSIS — G894 Chronic pain syndrome: Secondary | ICD-10-CM | POA: Diagnosis not present

## 2015-11-24 DIAGNOSIS — Z96651 Presence of right artificial knee joint: Secondary | ICD-10-CM | POA: Diagnosis not present

## 2015-11-24 DIAGNOSIS — M25561 Pain in right knee: Secondary | ICD-10-CM | POA: Diagnosis not present

## 2015-11-24 DIAGNOSIS — Z471 Aftercare following joint replacement surgery: Secondary | ICD-10-CM | POA: Diagnosis not present

## 2015-11-26 DIAGNOSIS — M25561 Pain in right knee: Secondary | ICD-10-CM | POA: Diagnosis not present

## 2015-11-28 DIAGNOSIS — M25561 Pain in right knee: Secondary | ICD-10-CM | POA: Diagnosis not present

## 2015-12-02 DIAGNOSIS — M25561 Pain in right knee: Secondary | ICD-10-CM | POA: Diagnosis not present

## 2015-12-05 DIAGNOSIS — M25561 Pain in right knee: Secondary | ICD-10-CM | POA: Diagnosis not present

## 2015-12-09 DIAGNOSIS — M25561 Pain in right knee: Secondary | ICD-10-CM | POA: Diagnosis not present

## 2015-12-11 DIAGNOSIS — Z96651 Presence of right artificial knee joint: Secondary | ICD-10-CM | POA: Diagnosis not present

## 2015-12-11 DIAGNOSIS — M25561 Pain in right knee: Secondary | ICD-10-CM | POA: Diagnosis not present

## 2015-12-11 DIAGNOSIS — Z471 Aftercare following joint replacement surgery: Secondary | ICD-10-CM | POA: Diagnosis not present

## 2015-12-16 DIAGNOSIS — Z96651 Presence of right artificial knee joint: Secondary | ICD-10-CM | POA: Diagnosis not present

## 2015-12-16 DIAGNOSIS — Z471 Aftercare following joint replacement surgery: Secondary | ICD-10-CM | POA: Diagnosis not present

## 2015-12-17 DIAGNOSIS — Z79891 Long term (current) use of opiate analgesic: Secondary | ICD-10-CM | POA: Diagnosis not present

## 2015-12-17 DIAGNOSIS — M25561 Pain in right knee: Secondary | ICD-10-CM | POA: Diagnosis not present

## 2015-12-17 DIAGNOSIS — M961 Postlaminectomy syndrome, not elsewhere classified: Secondary | ICD-10-CM | POA: Diagnosis not present

## 2015-12-17 DIAGNOSIS — G894 Chronic pain syndrome: Secondary | ICD-10-CM | POA: Diagnosis not present

## 2015-12-17 LAB — AFB CULTURE WITH SMEAR (NOT AT ARMC): Acid Fast Smear: NONE SEEN

## 2015-12-18 DIAGNOSIS — M25561 Pain in right knee: Secondary | ICD-10-CM | POA: Diagnosis not present

## 2015-12-18 DIAGNOSIS — Z471 Aftercare following joint replacement surgery: Secondary | ICD-10-CM | POA: Diagnosis not present

## 2015-12-18 DIAGNOSIS — Z96651 Presence of right artificial knee joint: Secondary | ICD-10-CM | POA: Diagnosis not present

## 2015-12-23 DIAGNOSIS — H2511 Age-related nuclear cataract, right eye: Secondary | ICD-10-CM | POA: Diagnosis not present

## 2015-12-23 DIAGNOSIS — H18411 Arcus senilis, right eye: Secondary | ICD-10-CM | POA: Diagnosis not present

## 2015-12-23 DIAGNOSIS — H18412 Arcus senilis, left eye: Secondary | ICD-10-CM | POA: Diagnosis not present

## 2015-12-23 DIAGNOSIS — H353130 Nonexudative age-related macular degeneration, bilateral, stage unspecified: Secondary | ICD-10-CM | POA: Diagnosis not present

## 2015-12-23 DIAGNOSIS — H02839 Dermatochalasis of unspecified eye, unspecified eyelid: Secondary | ICD-10-CM | POA: Diagnosis not present

## 2015-12-31 DIAGNOSIS — E784 Other hyperlipidemia: Secondary | ICD-10-CM | POA: Diagnosis not present

## 2015-12-31 DIAGNOSIS — N183 Chronic kidney disease, stage 3 (moderate): Secondary | ICD-10-CM | POA: Diagnosis not present

## 2015-12-31 DIAGNOSIS — E038 Other specified hypothyroidism: Secondary | ICD-10-CM | POA: Diagnosis not present

## 2016-01-12 DIAGNOSIS — E784 Other hyperlipidemia: Secondary | ICD-10-CM | POA: Diagnosis not present

## 2016-01-12 DIAGNOSIS — G4733 Obstructive sleep apnea (adult) (pediatric): Secondary | ICD-10-CM | POA: Diagnosis not present

## 2016-01-12 DIAGNOSIS — E038 Other specified hypothyroidism: Secondary | ICD-10-CM | POA: Diagnosis not present

## 2016-01-12 DIAGNOSIS — M545 Low back pain: Secondary | ICD-10-CM | POA: Diagnosis not present

## 2016-01-12 DIAGNOSIS — L609 Nail disorder, unspecified: Secondary | ICD-10-CM | POA: Diagnosis not present

## 2016-01-12 DIAGNOSIS — N183 Chronic kidney disease, stage 3 (moderate): Secondary | ICD-10-CM | POA: Diagnosis not present

## 2016-01-12 DIAGNOSIS — Z6833 Body mass index (BMI) 33.0-33.9, adult: Secondary | ICD-10-CM | POA: Diagnosis not present

## 2016-01-12 DIAGNOSIS — I10 Essential (primary) hypertension: Secondary | ICD-10-CM | POA: Diagnosis not present

## 2016-01-12 DIAGNOSIS — Z0000 Encounter for general adult medical examination without abnormal findings: Secondary | ICD-10-CM | POA: Diagnosis not present

## 2016-01-12 DIAGNOSIS — Z1389 Encounter for screening for other disorder: Secondary | ICD-10-CM | POA: Diagnosis not present

## 2016-01-12 DIAGNOSIS — M25562 Pain in left knee: Secondary | ICD-10-CM | POA: Diagnosis not present

## 2016-01-20 DIAGNOSIS — Z96651 Presence of right artificial knee joint: Secondary | ICD-10-CM | POA: Diagnosis not present

## 2016-01-20 DIAGNOSIS — M1712 Unilateral primary osteoarthritis, left knee: Secondary | ICD-10-CM | POA: Diagnosis not present

## 2016-01-20 DIAGNOSIS — Z471 Aftercare following joint replacement surgery: Secondary | ICD-10-CM | POA: Diagnosis not present

## 2016-02-03 DIAGNOSIS — M545 Low back pain: Secondary | ICD-10-CM | POA: Diagnosis not present

## 2016-02-03 DIAGNOSIS — Z6832 Body mass index (BMI) 32.0-32.9, adult: Secondary | ICD-10-CM | POA: Diagnosis not present

## 2016-02-16 DIAGNOSIS — Z79891 Long term (current) use of opiate analgesic: Secondary | ICD-10-CM | POA: Diagnosis not present

## 2016-02-16 DIAGNOSIS — G894 Chronic pain syndrome: Secondary | ICD-10-CM | POA: Diagnosis not present

## 2016-02-16 DIAGNOSIS — M961 Postlaminectomy syndrome, not elsewhere classified: Secondary | ICD-10-CM | POA: Diagnosis not present

## 2016-02-16 DIAGNOSIS — M25562 Pain in left knee: Secondary | ICD-10-CM | POA: Diagnosis not present

## 2016-02-17 DIAGNOSIS — Z471 Aftercare following joint replacement surgery: Secondary | ICD-10-CM | POA: Diagnosis not present

## 2016-02-17 DIAGNOSIS — Z96651 Presence of right artificial knee joint: Secondary | ICD-10-CM | POA: Diagnosis not present

## 2016-02-17 DIAGNOSIS — M1712 Unilateral primary osteoarthritis, left knee: Secondary | ICD-10-CM | POA: Diagnosis not present

## 2016-03-18 DIAGNOSIS — H353122 Nonexudative age-related macular degeneration, left eye, intermediate dry stage: Secondary | ICD-10-CM | POA: Diagnosis not present

## 2016-03-18 DIAGNOSIS — H353212 Exudative age-related macular degeneration, right eye, with inactive choroidal neovascularization: Secondary | ICD-10-CM | POA: Diagnosis not present

## 2016-03-18 DIAGNOSIS — H04123 Dry eye syndrome of bilateral lacrimal glands: Secondary | ICD-10-CM | POA: Diagnosis not present

## 2016-03-18 DIAGNOSIS — H00015 Hordeolum externum left lower eyelid: Secondary | ICD-10-CM | POA: Diagnosis not present

## 2016-03-29 DIAGNOSIS — H0015 Chalazion left lower eyelid: Secondary | ICD-10-CM | POA: Diagnosis not present

## 2016-04-12 DIAGNOSIS — Z79891 Long term (current) use of opiate analgesic: Secondary | ICD-10-CM | POA: Diagnosis not present

## 2016-04-12 DIAGNOSIS — M25562 Pain in left knee: Secondary | ICD-10-CM | POA: Diagnosis not present

## 2016-04-12 DIAGNOSIS — M961 Postlaminectomy syndrome, not elsewhere classified: Secondary | ICD-10-CM | POA: Diagnosis not present

## 2016-04-12 DIAGNOSIS — G894 Chronic pain syndrome: Secondary | ICD-10-CM | POA: Diagnosis not present

## 2016-04-26 DIAGNOSIS — H353132 Nonexudative age-related macular degeneration, bilateral, intermediate dry stage: Secondary | ICD-10-CM | POA: Diagnosis not present

## 2016-04-26 DIAGNOSIS — H01029 Squamous blepharitis unspecified eye, unspecified eyelid: Secondary | ICD-10-CM | POA: Diagnosis not present

## 2016-04-26 DIAGNOSIS — H2513 Age-related nuclear cataract, bilateral: Secondary | ICD-10-CM | POA: Diagnosis not present

## 2016-04-26 DIAGNOSIS — H0015 Chalazion left lower eyelid: Secondary | ICD-10-CM | POA: Diagnosis not present

## 2016-04-29 ENCOUNTER — Ambulatory Visit: Payer: Self-pay | Admitting: Orthopedic Surgery

## 2016-04-29 NOTE — Progress Notes (Signed)
Preoperative surgical orders have been place into the [facility] hospital system for [place] on 04/29/2016, 1:08 PM  by Mickel Crow for surgery on 05-17-16.  Preop Total Knee orders including Experal, IV Tylenol, and IV Decadron as long as there are no contraindications to the above medications. Arlee Muslim, PA-C

## 2016-05-05 ENCOUNTER — Encounter (HOSPITAL_COMMUNITY)
Admission: RE | Admit: 2016-05-05 | Discharge: 2016-05-05 | Disposition: A | Discharge status: Home / Self Care | Payer: Medicare Other | Source: Ambulatory Visit | Attending: Orthopedic Surgery | Admitting: Orthopedic Surgery

## 2016-05-05 ENCOUNTER — Encounter (HOSPITAL_COMMUNITY): Payer: Self-pay

## 2016-05-05 DIAGNOSIS — M1712 Unilateral primary osteoarthritis, left knee: Secondary | ICD-10-CM | POA: Diagnosis not present

## 2016-05-05 DIAGNOSIS — Z01812 Encounter for preprocedural laboratory examination: Secondary | ICD-10-CM | POA: Insufficient documentation

## 2016-05-05 LAB — TYPE AND SCREEN
ABO/RH(D): O POS
ANTIBODY SCREEN: NEGATIVE

## 2016-05-05 LAB — COMPREHENSIVE METABOLIC PANEL
ALBUMIN: 4.4 g/dL (ref 3.5–5.0)
ALK PHOS: 95 U/L (ref 38–126)
ALT: 22 U/L (ref 14–54)
ANION GAP: 8 (ref 5–15)
AST: 25 U/L (ref 15–41)
BILIRUBIN TOTAL: 0.9 mg/dL (ref 0.3–1.2)
BUN: 15 mg/dL (ref 6–20)
CALCIUM: 9.2 mg/dL (ref 8.9–10.3)
CO2: 30 mmol/L (ref 22–32)
CREATININE: 0.89 mg/dL (ref 0.44–1.00)
Chloride: 97 mmol/L — ABNORMAL LOW (ref 101–111)
GFR calc Af Amer: >60 mL/min (ref >60)
GLUCOSE: 94 mg/dL (ref 65–99)
Potassium: 3.8 mmol/L (ref 3.5–5.1)
Sodium: 135 mmol/L (ref 135–145)
Total Protein: 7.4 g/dL (ref 6.5–8.1)

## 2016-05-05 LAB — URINALYSIS, ROUTINE W REFLEX MICROSCOPIC
BILIRUBIN URINE: NEGATIVE
GLUCOSE, UA: NEGATIVE mg/dL
HGB URINE DIPSTICK: NEGATIVE
Ketones, ur: NEGATIVE mg/dL
Leukocytes, UA: NEGATIVE
Nitrite: NEGATIVE
PH: 6.5 (ref 5.0–8.0)
Protein, ur: NEGATIVE mg/dL
SPECIFIC GRAVITY, URINE: 1.004 — AB (ref 1.005–1.030)

## 2016-05-05 LAB — CBC
HEMATOCRIT: 36.2 % (ref 36.0–46.0)
HEMOGLOBIN: 12.3 g/dL (ref 12.0–15.0)
MCH: 30.8 pg (ref 26.0–34.0)
MCHC: 34 g/dL (ref 30.0–36.0)
MCV: 90.5 fL (ref 78.0–100.0)
Platelets: 217 10*3/uL (ref 150–400)
RBC: 4 MIL/uL (ref 3.87–5.11)
RDW: 13.6 % (ref 11.5–15.5)
WBC: 7.2 10*3/uL (ref 4.0–10.5)

## 2016-05-05 LAB — SURGICAL PCR SCREEN
MRSA, PCR: NEGATIVE
Staphylococcus aureus: NEGATIVE

## 2016-05-05 LAB — APTT: aPTT: 36 seconds (ref 24–37)

## 2016-05-05 LAB — PROTIME-INR
INR: 1.13 (ref 0.00–1.49)
Prothrombin Time: 14.3 seconds (ref 11.6–15.2)

## 2016-05-05 NOTE — Patient Instructions (Addendum)
[name] [name]  05/05/2016   Your procedure is scheduled on: Monday May 17, 2016  Report to [facility]  Entrance take [place]  elevators to 3rd floor to  [place] at 8:30 AM.  Call this number if you have problems the morning of surgery [phone]   Remember: ONLY 1 PERSON MAY GO WITH YOU TO SHORT STAY TO GET  READY MORNING OF [place].  Do not eat food or drink liquids :After Midnight.     Take these medicines the morning of surgery with A SIP OF WATER: Bupropion (Wellbutrin); Carvedilol (Coreg); Diazepam (Valium); May use flonase nasal spray if needed (bring bottle with you day of surgery); Gabapentin (Neurontin); Synthyroid; Loratadine if needed (allergy medication); Oxycodone if needed; Methadone; May use eye drops if needed                                You may not have any metal on your body including hair pins and              piercings  Do not wear jewelry, make-up, lotions, powders or perfumes, deodorant             Do not wear nail polish.  Do not shave  48 hours prior to surgery.              Do not bring valuables to the hospital. [place].  Contacts, dentures or bridgework may not be worn into surgery.  Leave suitcase in the car. After surgery it may be brought to your room.                Please read over the following fact sheets you were given:[facility]SA INFORMATION SHEET; INCENTIVE SPIROMETER; BLOOD TRANSFUSION INFORMATION SHEET  _____________________________________________________________________               Incentive Spirometer  An incentive spirometer is a tool that can help keep your lungs clear and active. This tool measures how well you are filling your lungs with each breath. Taking long deep breaths may help reverse or decrease the chance of developing breathing (pulmonary) problems (especially infection) following:  A long period of time when you are unable to move or  be active. BEFORE THE PROCEDURE   If the spirometer includes an indicator to show your best effort, your nurse or respiratory therapist will set it to a desired goal.  If possible, sit up straight or lean slightly forward. Try not to slouch.  Hold the incentive spirometer in an upright position. INSTRUCTIONS FOR USE   Sit on the edge of your bed if possible, or sit up as far as you can in bed or on a chair.  Hold the incentive spirometer in an upright position.  Breathe out normally.  Place the mouthpiece in your mouth and seal your lips tightly around it.  Breathe in slowly and as deeply as possible, raising the piston or the ball toward the top of the column.  Hold your breath for 3-5 seconds or for as long as possible. Allow the piston or ball to fall to the bottom of the column.  Remove the mouthpiece from your mouth and breathe out normally.  Rest for a few seconds  and repeat Steps 1 through 7 at least 10 times every 1-2 hours when you are awake. Take your time and take a few normal breaths between deep breaths.  The spirometer may include an indicator to show your best effort. Use the indicator as a goal to work toward during each repetition.  After each set of 10 deep breaths, practice coughing to be sure your lungs are clear. If you have an incision (the cut made at the time of surgery), support your incision when coughing by placing a pillow or rolled up towels firmly against it. Once you are able to get out of bed, walk around indoors and cough well. You may stop using the incentive spirometer when instructed by your caregiver.  RISKS AND COMPLICATIONS  Take your time so you do not get dizzy or light-headed.  If you are in pain, you may need to take or ask for pain medication before doing incentive spirometry. It is harder to take a deep breath if you are having pain. AFTER USE  Rest and breathe slowly and easily.  It can be helpful to keep track of a log of your  progress. Your caregiver can provide you with a simple table to help with this. If you are using the spirometer at home, follow these instructions: [place] IF:   You are having difficultly using the spirometer.  You have trouble using the spirometer as often as instructed.  Your pain medication is not giving enough relief while using the spirometer.  You develop fever of 100.5 F (38.1 C) or higher. SEEK IMMEDIATE MEDICAL CARE IF:   You cough up bloody sputum that had not been present before.  You develop fever of 102 F (38.9 C) or greater.  You develop worsening pain at or near the incision site. MAKE SURE YOU:   Understand these instructions.  Will watch your condition.  Will get help right away if you are not doing well or get worse. Document Released: 04/04/2007 Document Revised: 02/14/2012 Document Reviewed: 06/05/2007 ExitCare Patient Information 2014 ExitCare, [place].   ________________________________________________________________________  WHAT IS A BLOOD TRANSFUSION? Blood Transfusion Information  A transfusion is the replacement of blood or some of its parts. Blood is made up of multiple cells which provide different functions.  Red blood cells carry oxygen and are used for blood loss replacement.  White blood cells fight against infection.  Platelets control bleeding.  Plasma helps clot blood.  Other blood products are available for specialized needs, such as hemophilia or other clotting disorders. BEFORE THE TRANSFUSION  Who gives blood for transfusions?   Healthy volunteers who are fully evaluated to make sure their blood is safe. This is blood bank blood. Transfusion therapy is the safest it has ever been in the practice of medicine. Before blood is taken from a donor, a complete history is taken to make sure that person has no history of diseases nor engages in risky social behavior (examples are intravenous drug use or sexual activity with  multiple partners). The donor's travel history is screened to minimize risk of transmitting infections, such as malaria. The donated blood is tested for signs of infectious diseases, such as HIV and hepatitis. The blood is then tested to be sure it is compatible with you in order to minimize the chance of a transfusion reaction. If you or a relative donates blood, this is often done in anticipation of surgery and is not appropriate for emergency situations. It takes many days to process the  donated blood. RISKS AND COMPLICATIONS Although transfusion therapy is very safe and saves many lives, the main dangers of transfusion include:   Getting an infectious disease.  Developing a transfusion reaction. This is an allergic reaction to something in the blood you were given. Every precaution is taken to prevent this. The decision to have a blood transfusion has been considered carefully by your caregiver before blood is given. Blood is not given unless the benefits outweigh the risks. AFTER THE TRANSFUSION  Right after receiving a blood transfusion, you will usually feel much better and more energetic. This is especially true if your red blood cells have gotten low (anemic). The transfusion raises the level of the red blood cells which carry oxygen, and this usually causes an energy increase.  The nurse administering the transfusion will monitor you carefully for complications. HOME CARE INSTRUCTIONS  No special instructions are needed after a transfusion. You may find your energy is better. Speak with your caregiver about any limitations on activity for underlying diseases you may have. SEEK MEDICAL CARE IF:   Your condition is not improving after your transfusion.  You develop redness or irritation at the intravenous (IV) site. SEEK IMMEDIATE MEDICAL CARE IF:  Any of the following symptoms occur over the next 12 hours:  Shaking chills.  You have a temperature by mouth above 102 F (38.9 C), not  controlled by medicine.  Chest, back, or muscle pain.  People around you feel you are not acting correctly or are confused.  Shortness of breath or difficulty breathing.  Dizziness and fainting.  You get a rash or develop hives.  You have a decrease in urine output.  Your urine turns a dark color or changes to pink, red, or brown. Any of the following symptoms occur over the next 10 days:  You have a temperature by mouth above 102 F (38.9 C), not controlled by medicine.  Shortness of breath.  Weakness after normal activity.  The white part of the eye turns yellow (jaundice).  You have a decrease in the amount of urine or are urinating less often.  Your urine turns a dark color or changes to pink, red, or brown. Document Released: 11/19/2000 Document Revised: 02/14/2012 Document Reviewed: 07/08/2008 ExitCare Patient Information 2014 [place].  _______________________________________________________________________ [facility] - Preparing for Surgery Before surgery, you can play an important role.  Because skin is not sterile, your skin needs to be as free of germs as possible.  You can reduce the number of germs on your skin by washing with CHG (chlorahexidine gluconate) soap before surgery.  CHG is an antiseptic cleaner which kills germs and bonds with the skin to continue killing germs even after washing. Please DO NOT use if you have an allergy to CHG or antibacterial soaps.  If your skin becomes reddened/irritated stop using the CHG and inform your nurse when you arrive at Short Stay. Do not shave (including legs and underarms) for at least 48 hours prior to the first CHG shower.  You may shave your face/neck. Please follow these instructions carefully:  1.  Shower with CHG Soap the night before surgery and the  morning of Surgery.  2.  If you choose to wash your hair, wash your hair first as usual with your  normal  shampoo.  3.  After you shampoo, rinse your hair  and body thoroughly to remove the  shampoo.  4.  Use CHG as you would any other liquid soap.  You can apply chg directly  to the skin and wash                       Gently with a scrungie or clean washcloth.  5.  Apply the CHG Soap to your body ONLY FROM THE NECK DOWN.   Do not use on face/ open                           Wound or open sores. Avoid contact with eyes, ears mouth and genitals (private parts).                       Wash face,  Genitals (private parts) with your normal soap.             6.  Wash thoroughly, paying special attention to the area where your surgery  will be performed.  7.  Thoroughly rinse your body with warm water from the neck down.  8.  DO NOT shower/wash with your normal soap after using and rinsing off  the CHG Soap.                9.  Pat yourself dry with a clean towel.            10.  Wear clean pajamas.            11.  Place clean sheets on your bed the night of your first shower and do not  sleep with pets. Day of Surgery : Do not apply any lotions/deodorants the morning of surgery.  Please wear clean clothes to the hospital/surgery center.  FAILURE TO FOLLOW THESE INSTRUCTIONS MAY RESULT IN THE CANCELLATION OF YOUR SURGERY PATIENT SIGNATURE_________________________________  NURSE SIGNATURE__________________________________  ________________________________________________________________________

## 2016-05-11 NOTE — H&P (Signed)
TOTAL KNEE ADMISSION H&P  Patient is being admitted for left total knee arthroplasty.  Subjective:  Chief Complaint:left knee pain.  HPI: [name] [name], 75 y.o. female, has a history of pain and functional disability in the left knee due to arthritis and has failed non-surgical conservative treatments for greater than 12 weeks to includeNSAID's and/or analgesics, corticosteriod injections, use of assistive devices and activity modification.  Onset of symptoms was gradual, starting >10 years ago with gradually worsening course since that time. The patient noted prior procedures on the knee to include  arthroscopy and menisectomy on the left knee(s).  Patient currently rates pain in the left knee(s) at 8 out of 10 with activity. Patient has night pain, worsening of pain with activity and weight bearing, pain that interferes with activities of daily living, pain with passive range of motion, crepitus and joint swelling.  Patient has evidence of periarticular osteophytes and joint space narrowing by imaging studies.  There is no active infection.  Patient Active Problem List   Diagnosis Date Noted  . 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 Failed total knee, right ([place]) 11/04/2015  . S/P TAH (total abdominal hysterectomy) 10/08/2014  . Pelvic prolapse 10/07/2014  . Cystocele 08/16/2014  . Glenohumeral arthritis 05/09/2014  . Right knee DJD 08/29/2012    Class: Chronic   Past Medical History  Diagnosis Date  . Hypertension   . Hypothyroidism   . Chronic pain     lower back  . Bronchitis     history only  . Ulcer     hx  . Virus not detected 2010    postop, caught virus in hosp., respiratory symptoms, upon d/c to home, had allergic reaction /w plate size hives - not sure of the cause    . Sinus infection     recent sinus infection, finished Zpak- 08/16/2013  . Female bladder prolapse   . Anemia     took iron for a while  . SVD (spontaneous vaginal delivery)     x 2  .  Missed ab     x 6 - 2 surgery, 4 resolved on it's own  . Seasonal allergies   . Neuromuscular disorder ([place])     , left foot has some numbeness- uses cane  . PONV (postoperative nausea and vomiting)     states she had scop patch in past,states she sets off alarms when she is waking up, she "forgets to breathe"   . Allergy   . Blood transfusion without reported diagnosis   . Cataract   . Macular degeneration     wet- right eye  dry- left eye  . Hyperlipidemia   . Osteopenia   . Acquired left flat foot   . 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 Numbness     outer edge of heel left foot   . Tinnitus   . Imbalance   . Contusion of left hand   . Pneumonia   . Fracture of metatarsal bone     w nonunion   . Hallux rigidus   . Urinary tract infection   . Urinary incontinence   . Acquired leg length discrepancy   . Metatarsalgia of left foot   . Trigger middle finger of left hand   . Allergic urticaria   . Shingles   . History of measles   . History of mumps   . History of rubella   . Menopause   . Chronic fatigue syndrome   .  Arthritis     lower back, knees    Past Surgical History  Procedure Laterality Date  . Hammer toe surgery  4/12    lt foot-2-3  . Appendectomy    . Metatarsal osteotomy  3/12    lt  . Thumb fusion  2008    rt  . Carpal tunnel release      both  . Knee arthroscopy  2007    both  . Orif toe fracture  11/04/2011    Procedure: OPEN REDUCTION INTERNAL FIXATION (ORIF) METATARSAL (TOE) FRACTURE;  Surgeon: Wylene Simmer, MD;  Location: [facility];  Service: Orthopedics;  Laterality: Left;  left revision orif 1st metatarsal with autograft from left calcaneous and left 2nd-3rd metatarsal weil osteotomy  . 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 Dilation and curettage of uterus      x2  . Total knee arthroplasty  08/29/2012    Procedure: TOTAL KNEE ARTHROPLASTY;  Surgeon: Hessie Dibble, MD;  Location: [facility];  Service: Orthopedics;   Laterality: Right;  . Other surgical history  05/08/13    hardware removed from left foot  . Bunionectomy  03/02/11 and 11/04/11    left foot x2  . Joint replacement      R- TKA- 2013  . Tubal ligation    . Total shoulder arthroplasty Right 05/09/2014    Procedure: TOTAL SHOULDER ARTHROPLASTY;  Surgeon: Nita Sells, MD;  Location: [facility];  Service: Orthopedics;  Laterality: Right;  Right shoulder arthroplasty  . Lumbar laminectomy  05/1999    L4-L5  . Back surgery  09,00,10,99, 6/14,9/14    x6 surgeries on back -Spinal Fusin L4-L5, Lumbar Fusion L1-S1  . Breast surgery      left cyst- benign  . Abdominal hysterectomy Bilateral 10/07/2014    Procedure: TOTAL ABDOMINAL HYSTERECTOMY WITH BILATERAL SALPINGO OOPHORECTOMY AND HALBANS CULDOELASTY ;  Surgeon: Lyman Speller, MD;  Location: [facility];  Service: Gynecology;  Laterality: Bilateral;  Miller primary/Silva assist  4 hours total  . Anterior and posterior repair N/A 10/07/2014    Procedure: ANTERIOR (CYSTOCELE) AND POSTERIOR REPAIR (RECTOCELE);  Surgeon: Lyman Speller, MD;  Location: [facility];  Service: Gynecology;  Laterality: N/A;  . Bladder suspension N/A 10/07/2014    Procedure: TRANSVAGINAL TAPE (TVT) EXACT PROCEDURE;  Surgeon: Lyman Speller, MD;  Location: [facility];  Service: Gynecology;  Laterality: N/A;  . Cystoscopy N/A 10/07/2014    Procedure: CYSTOSCOPY;  Surgeon: Lyman Speller, MD;  Location: [facility];  Service: Gynecology;  Laterality: N/A;  . Removal of two osteomas      on forehead-2008  . Total knee revision Right 11/04/2015    Procedure: TOTAL KNEE ARTHROPLASTY REVISION;  Surgeon: Gaynelle Arabian, MD;  Location: [facility];  Service: Orthopedics;  Laterality: Right;  . Steriod injection Left 11/04/2015    Procedure: STEROID INJECTION;  Surgeon: Gaynelle Arabian, MD;  Location: [facility];  Service: Orthopedics;  Laterality: Left;  . I&d and spina abscess l4-5        Current outpatient prescriptions:  .  Ascorbic  Acid (VITAMIN C) 1000 MG tablet, Take 1,000 mg by mouth daily., Disp: , Rfl:  .  aspirin EC 81 MG tablet, Take 81 mg by mouth daily., Disp: , Rfl:  .  atorvastatin (LIPITOR) 20 MG tablet, Take 20 mg by mouth at bedtime. , Disp: , Rfl:  .  buPROPion (WELLBUTRIN SR) 100 MG 12 hr tablet, Take 100 mg by mouth  daily. , Disp: , Rfl: 5 .  Calcium-Magnesium-Vitamin D (CALCIUM 1200+D3 PO), Take 1 tablet by mouth 2 (two) times daily., Disp: , Rfl:  .  Carboxymethylcellulose Sodium (REFRESH TEARS OP), Place 2 drops into both eyes daily as needed (For dry eyes.)., Disp: , Rfl:  .  carvedilol (COREG) 6.25 MG tablet, Take 6.25 mg by mouth 2 (two) times daily with a meal. , Disp: , Rfl:  .  Cyanocobalamin (VITAMIN B-12) 5000 MCG TBDP, Take 5,000 mcg by mouth daily., Disp: , Rfl:  .  diazepam (VALIUM) 5 MG tablet, Take 5 mg by mouth every 6 (six) hours as needed (For back spasms.). , Disp: , Rfl:  .  EPIPEN 2-PAK 0.3 MG/0.3ML SOAJ injection, Inject 0.3 mg into the muscle once as needed (For anaphylaxis.). , Disp: , Rfl: 11 .  etodolac (LODINE) 400 MG tablet, Take 400 mg by mouth 2 (two) times daily., Disp: , Rfl:  .  fluorometholone (FML) 0.1 % ophthalmic suspension, Place 1 drop into both eyes 2 (two) times daily., Disp: , Rfl:  .  fluticasone (FLONASE) 50 MCG/ACT nasal spray, Place 2 sprays into both nostrils daily as needed for allergies., Disp: , Rfl:  .  furosemide (LASIX) 40 MG tablet, Take 40 mg by mouth daily before supper. , Disp: , Rfl:  .  gabapentin (NEURONTIN) 600 MG tablet, Take 600 mg by mouth 3 (three) times daily. , Disp: , Rfl:  .  levothyroxine (SYNTHROID, LEVOTHROID) 75 MCG tablet, Take 75 mcg by mouth daily. , Disp: , Rfl:  .  loratadine (KLS ALLERCLEAR) 10 MG tablet, Take 10 mg by mouth daily as needed for allergies., Disp: , Rfl:  .  magnesium oxide (MAG-OX) 400 MG tablet, Take 400 mg by mouth daily., Disp: , Rfl:  .  methadone (DOLOPHINE) 5 MG tablet, Take 5 mg by mouth every 8 (eight)  hours. , Disp: , Rfl:  .  Multiple Vitamin (MULTIVITAMIN WITH MINERALS) TABS tablet, Take 1 tablet by mouth daily., Disp: , Rfl:  .  Multiple Vitamins-Minerals (PRESERVISION AREDS PO), Take 1 tablet by mouth 2 (two) times daily., Disp: , Rfl:  .  OVER THE COUNTER MEDICATION, Take 2,000 mg by mouth daily. Flaxseed Oil 2000mg , Disp: , Rfl:  .  OVER THE COUNTER MEDICATION, Take 1,000 mcg by mouth 2 (two) times daily. Biotin 1066mcg, Disp: , Rfl:  .  oxyCODONE (OXY IR/ROXICODONE) 5 MG immediate release tablet, Take 1-3 tablets (5-15 mg total) by mouth every 4 (four) hours as needed for moderate pain or severe pain., Disp: 90 tablet, Rfl: 0 .  senna (SENOKOT) 8.6 MG tablet, Take 2-3 tablets by mouth 2 (two) times daily. Take two tablets in the morning and three tablets in the evening., Disp: , Rfl:  .  traMADol (ULTRAM) 50 MG tablet, Take 1-2 tablets (50-100 mg total) by mouth every 6 (six) hours as needed (mild pain)., Disp: 80 tablet, Rfl: 1  Allergies  Allergen Reactions  . Oxycodone Hives    10mg  pink pill, allergic only to pink pills, if not pink she can take  . Flexeril [Cyclobenzaprine] Hives  . Red Dye Hives and Swelling  . Adhesive [Tape] Other (See Comments)    Blisters - Tolerates paper tape  . Ciprofloxacin Hives  . Crab [Shellfish Allergy] Swelling and Other (See Comments)    Lip swellig with artificial crab meat  . Pravastatin Hives and Other (See Comments)    Tolerates atorvastatin  . Tylenol [Acetaminophen] Hives and Other (See Comments)  Makes her feel funny. Cannot take Tylenol in combination products (Percocet, Vicodin), but can take it alone Tolerates opiates individually except with red dye  . Latex Hives and Other (See Comments)    Blisters  . Lisinopril Hives  . Methocarbamol Hives  . Morphine And Related Other (See Comments)    Does not provide any pain relief  . Sulfa Antibiotics Hives    Social History  Substance Use Topics  . Smoking status: Never Smoker    . Smokeless tobacco: Never Used     Comment: occ wine  . Alcohol Use: 0.6 oz/week    1 Standard drinks or equivalent per week     Comment: glass of wine occas    Family History  Problem Relation Age of Onset  . Hyperlipidemia Father   . Hypertension Father   . Heart attack Father 64    deceased  . Colon cancer Neg Hx   . Esophageal cancer Neg Hx   . Stomach cancer Neg Hx   . Rectal cancer Neg Hx      Review of Systems  Constitutional: Positive for malaise/fatigue. Negative for fever, chills, weight loss and diaphoresis.  HENT: Positive for tinnitus. Negative for congestion, ear discharge, ear pain, hearing loss, nosebleeds and sore throat.   Eyes: Negative.   Respiratory: Negative.  Negative for stridor.   Cardiovascular: Negative.   Gastrointestinal: Negative.   Genitourinary: Negative.   Musculoskeletal: Positive for myalgias, back pain and joint pain. Negative for falls and neck pain.  Skin: Negative.   Neurological: Positive for headaches. Negative for dizziness, tingling, tremors, sensory change, speech change, focal weakness, seizures, loss of consciousness and weakness.  Endo/Heme/Allergies: Positive for environmental allergies. Negative for polydipsia. Does not bruise/bleed easily.  Psychiatric/Behavioral: Negative.     Objective:  Physical Exam  Constitutional: She is oriented to person, place, and time. She appears well-developed. No distress.  Overweight  HENT:  Head: Normocephalic and atraumatic.  Right Ear: External ear normal.  Left Ear: External ear normal.  Nose: Nose normal.  Eyes: Conjunctivae and EOM are normal.  Neck: Normal range of motion. Neck supple.  Cardiovascular: Normal rate, regular rhythm, normal heart sounds and intact distal pulses.   No murmur heard. Respiratory: Effort normal and breath sounds normal. No respiratory distress. She has no wheezes.  GI: Soft. Bowel sounds are normal. She exhibits no distension. There is no tenderness.   Musculoskeletal:       Right hip: Normal.       Left hip: Normal.  Her right knee looks excellent. Her range of motion is about 0 to 122. There is no tenderness. There is no instability about the knee. Left knee, no effusion. Range about 5 to 110. Marked crepitus on range of motion. Tender medial greater than lateral. No instability.  Neurological: She is alert and oriented to person, place, and time. She has normal strength and normal reflexes. No sensory deficit.  Skin: No rash noted. She is not diaphoretic. No erythema.  Psychiatric: She has a normal mood and affect. Her behavior is normal.    Vitals  Weight: 188 lb Height: 63in Body Surface Area: 1.88 m Body Mass Index: 33.3 kg/m  Pulse: 76 (Regular)  BP: 120/76 (Sitting, Right Arm, Standard)  Imaging Review Plain radiographs demonstrate severe degenerative joint disease of the left knee(s). The overall alignment ismild varus. The bone quality appears to be good for age and reported activity level.  Assessment/Plan:  End stage primary osteoarthritis, left knee  The patient history, physical examination, clinical judgment of the provider and imaging studies are consistent with end stage degenerative joint disease of the left knee(s) and total knee arthroplasty is deemed medically necessary. The treatment options including medical management, injection therapy arthroscopy and arthroplasty were discussed at length. The risks and benefits of total knee arthroplasty were presented and reviewed. The risks due to aseptic loosening, infection, stiffness, patella tracking problems, thromboembolic complications and other imponderables were discussed. The patient acknowledged the explanation, agreed to proceed with the plan and consent was signed. Patient is being admitted for inpatient treatment for surgery, pain control, PT, OT, prophylactic antibiotics, VTE prophylaxis, progressive ambulation and ADL's and discharge planning. The  patient is planning to be discharged home with home health services (wants Erin/Erika with [facility])     PCP: Dr. Philip Aspen  No steri-strips  Ardeen Jourdain. PA-C

## 2016-05-17 ENCOUNTER — Encounter (HOSPITAL_COMMUNITY)
Admission: RE | Disposition: A | Discharge status: Home Health | Payer: Self-pay | Source: Ambulatory Visit | Attending: Orthopedic Surgery

## 2016-05-17 ENCOUNTER — Encounter (HOSPITAL_COMMUNITY): Payer: Self-pay | Admitting: *Deleted

## 2016-05-17 ENCOUNTER — Inpatient Hospital Stay (HOSPITAL_COMMUNITY)
Admission: RE | Admit: 2016-05-17 | Discharge: 2016-05-20 | DRG: 470 | Disposition: A | Discharge status: Home Health | Payer: Medicare Other | Source: Ambulatory Visit | Attending: Orthopedic Surgery | Admitting: Orthopedic Surgery

## 2016-05-17 ENCOUNTER — Inpatient Hospital Stay (HOSPITAL_COMMUNITY): Payer: Medicare Other | Admitting: Anesthesiology

## 2016-05-17 DIAGNOSIS — M170 Bilateral primary osteoarthritis of knee: Secondary | ICD-10-CM | POA: Diagnosis present

## 2016-05-17 DIAGNOSIS — G8918 Other acute postprocedural pain: Secondary | ICD-10-CM | POA: Diagnosis not present

## 2016-05-17 DIAGNOSIS — Z6832 Body mass index (BMI) 32.0-32.9, adult: Secondary | ICD-10-CM | POA: Diagnosis not present

## 2016-05-17 DIAGNOSIS — Z96651 Presence of right artificial knee joint: Secondary | ICD-10-CM | POA: Diagnosis present

## 2016-05-17 DIAGNOSIS — J302 Other seasonal allergic rhinitis: Secondary | ICD-10-CM | POA: Diagnosis present

## 2016-05-17 DIAGNOSIS — R002 Palpitations: Secondary | ICD-10-CM | POA: Diagnosis not present

## 2016-05-17 DIAGNOSIS — E669 Obesity, unspecified: Secondary | ICD-10-CM | POA: Diagnosis present

## 2016-05-17 DIAGNOSIS — Z7982 Long term (current) use of aspirin: Secondary | ICD-10-CM | POA: Diagnosis not present

## 2016-05-17 DIAGNOSIS — R0902 Hypoxemia: Secondary | ICD-10-CM | POA: Diagnosis not present

## 2016-05-17 DIAGNOSIS — R000 Tachycardia, unspecified: Secondary | ICD-10-CM | POA: Diagnosis not present

## 2016-05-17 DIAGNOSIS — Z882 Allergy status to sulfonamides status: Secondary | ICD-10-CM

## 2016-05-17 DIAGNOSIS — Z8249 Family history of ischemic heart disease and other diseases of the circulatory system: Secondary | ICD-10-CM

## 2016-05-17 DIAGNOSIS — Z881 Allergy status to other antibiotic agents status: Secondary | ICD-10-CM | POA: Diagnosis not present

## 2016-05-17 DIAGNOSIS — E039 Hypothyroidism, unspecified: Secondary | ICD-10-CM | POA: Diagnosis present

## 2016-05-17 DIAGNOSIS — R918 Other nonspecific abnormal finding of lung field: Secondary | ICD-10-CM | POA: Diagnosis not present

## 2016-05-17 DIAGNOSIS — Z91040 Latex allergy status: Secondary | ICD-10-CM | POA: Diagnosis not present

## 2016-05-17 DIAGNOSIS — M179 Osteoarthritis of knee, unspecified: Secondary | ICD-10-CM | POA: Diagnosis present

## 2016-05-17 DIAGNOSIS — E876 Hypokalemia: Secondary | ICD-10-CM

## 2016-05-17 DIAGNOSIS — R61 Generalized hyperhidrosis: Secondary | ICD-10-CM | POA: Diagnosis not present

## 2016-05-17 DIAGNOSIS — Z96611 Presence of right artificial shoulder joint: Secondary | ICD-10-CM | POA: Diagnosis present

## 2016-05-17 DIAGNOSIS — R9431 Abnormal electrocardiogram [ECG] [EKG]: Secondary | ICD-10-CM | POA: Diagnosis not present

## 2016-05-17 DIAGNOSIS — J9601 Acute respiratory failure with hypoxia: Secondary | ICD-10-CM | POA: Diagnosis not present

## 2016-05-17 DIAGNOSIS — M1712 Unilateral primary osteoarthritis, left knee: Secondary | ICD-10-CM | POA: Diagnosis not present

## 2016-05-17 DIAGNOSIS — Z79891 Long term (current) use of opiate analgesic: Secondary | ICD-10-CM | POA: Diagnosis not present

## 2016-05-17 DIAGNOSIS — Z9109 Other allergy status, other than to drugs and biological substances: Secondary | ICD-10-CM

## 2016-05-17 DIAGNOSIS — Z79899 Other long term (current) drug therapy: Secondary | ICD-10-CM

## 2016-05-17 DIAGNOSIS — Z91013 Allergy to seafood: Secondary | ICD-10-CM | POA: Diagnosis not present

## 2016-05-17 DIAGNOSIS — M25562 Pain in left knee: Secondary | ICD-10-CM | POA: Diagnosis not present

## 2016-05-17 DIAGNOSIS — Z981 Arthrodesis status: Secondary | ICD-10-CM

## 2016-05-17 DIAGNOSIS — M25762 Osteophyte, left knee: Secondary | ICD-10-CM | POA: Diagnosis present

## 2016-05-17 DIAGNOSIS — Z9102 Food additives allergy status: Secondary | ICD-10-CM | POA: Diagnosis not present

## 2016-05-17 DIAGNOSIS — R42 Dizziness and giddiness: Secondary | ICD-10-CM | POA: Diagnosis not present

## 2016-05-17 DIAGNOSIS — R7981 Abnormal blood-gas level: Secondary | ICD-10-CM

## 2016-05-17 DIAGNOSIS — Z885 Allergy status to narcotic agent status: Secondary | ICD-10-CM | POA: Diagnosis not present

## 2016-05-17 DIAGNOSIS — I10 Essential (primary) hypertension: Secondary | ICD-10-CM | POA: Diagnosis present

## 2016-05-17 DIAGNOSIS — J9811 Atelectasis: Secondary | ICD-10-CM | POA: Diagnosis not present

## 2016-05-17 DIAGNOSIS — E785 Hyperlipidemia, unspecified: Secondary | ICD-10-CM | POA: Diagnosis present

## 2016-05-17 DIAGNOSIS — M1710 Unilateral primary osteoarthritis, unspecified knee: Secondary | ICD-10-CM | POA: Diagnosis present

## 2016-05-17 DIAGNOSIS — Z888 Allergy status to other drugs, medicaments and biological substances status: Secondary | ICD-10-CM

## 2016-05-17 HISTORY — PX: TOTAL KNEE ARTHROPLASTY: SHX125

## 2016-05-17 SURGERY — ARTHROPLASTY, KNEE, TOTAL
Anesthesia: General | Site: Knee | Laterality: Left

## 2016-05-17 MED ORDER — MIDAZOLAM HCL 2 MG/2ML IJ SOLN
INTRAMUSCULAR | Status: AC
  Filled 2016-05-17: qty 2

## 2016-05-17 MED ORDER — TRANEXAMIC ACID 1000 MG/10ML IV SOLN
1000.0000 mg | INTRAVENOUS | Status: AC
  Administered 2016-05-17: 1000 mg via INTRAVENOUS
  Filled 2016-05-17: qty 10

## 2016-05-17 MED ORDER — BUPIVACAINE-EPINEPHRINE (PF) 0.5% -1:200000 IJ SOLN
INTRAMUSCULAR | Status: AC
  Filled 2016-05-17: qty 30

## 2016-05-17 MED ORDER — HYDROMORPHONE HCL 1 MG/ML IJ SOLN
INTRAMUSCULAR | Status: DC | PRN
  Administered 2016-05-17: .5 mg via INTRAVENOUS
  Administered 2016-05-17: 1 mg via INTRAVENOUS

## 2016-05-17 MED ORDER — PROMETHAZINE HCL 25 MG/ML IJ SOLN: 6.2500 mg | INTRAMUSCULAR | Status: DC | PRN

## 2016-05-17 MED ORDER — SCOPOLAMINE 1 MG/3DAYS TD PT72
MEDICATED_PATCH | TRANSDERMAL | Status: AC
  Filled 2016-05-17: qty 1

## 2016-05-17 MED ORDER — SODIUM CHLORIDE 0.9 % IV SOLN
INTRAVENOUS | Status: DC
  Administered 2016-05-17: 15:00:00 via INTRAVENOUS

## 2016-05-17 MED ORDER — ACETAMINOPHEN 10 MG/ML IV SOLN
INTRAVENOUS | Status: AC
  Filled 2016-05-17: qty 100

## 2016-05-17 MED ORDER — HYDROMORPHONE HCL 2 MG PO TABS
2.0000 mg | ORAL_TABLET | ORAL | Status: DC | PRN
  Administered 2016-05-17 – 2016-05-18 (×5): 2 mg via ORAL
  Filled 2016-05-17 (×6): qty 1

## 2016-05-17 MED ORDER — TRAMADOL HCL 50 MG PO TABS
50.0000 mg | ORAL_TABLET | Freq: Four times a day (QID) | ORAL | Status: DC | PRN
  Administered 2016-05-18: 100 mg via ORAL
  Filled 2016-05-17 (×2): qty 2

## 2016-05-17 MED ORDER — ATORVASTATIN CALCIUM 20 MG PO TABS
20.0000 mg | ORAL_TABLET | Freq: Every day | ORAL | Status: DC
  Administered 2016-05-17 – 2016-05-19 (×3): 20 mg via ORAL
  Filled 2016-05-17 (×3): qty 1

## 2016-05-17 MED ORDER — BUPIVACAINE LIPOSOME 1.3 % IJ SUSP
INTRAMUSCULAR | Status: DC | PRN
  Administered 2016-05-17: 20 mL

## 2016-05-17 MED ORDER — MENTHOL 3 MG MT LOZG: 1.0000 | LOZENGE | OROMUCOSAL | Status: DC | PRN

## 2016-05-17 MED ORDER — HYDROMORPHONE HCL 2 MG/ML IJ SOLN
INTRAMUSCULAR | Status: AC
  Filled 2016-05-17: qty 1

## 2016-05-17 MED ORDER — LIDOCAINE HCL (CARDIAC) 20 MG/ML IV SOLN
INTRAVENOUS | Status: AC
  Filled 2016-05-17: qty 5

## 2016-05-17 MED ORDER — EPHEDRINE SULFATE 50 MG/ML IJ SOLN
INTRAMUSCULAR | Status: DC | PRN
  Administered 2016-05-17 (×2): 10 mg via INTRAVENOUS

## 2016-05-17 MED ORDER — PHENOL 1.4 % MT LIQD: 1.0000 | OROMUCOSAL | Status: DC | PRN

## 2016-05-17 MED ORDER — ACETAMINOPHEN 325 MG PO TABS: 650.0000 mg | ORAL_TABLET | Freq: Four times a day (QID) | ORAL | Status: DC | PRN

## 2016-05-17 MED ORDER — PROPOFOL 10 MG/ML IV BOLUS
INTRAVENOUS | Status: AC
  Filled 2016-05-17: qty 20

## 2016-05-17 MED ORDER — ROCURONIUM BROMIDE 100 MG/10ML IV SOLN
INTRAVENOUS | Status: DC | PRN
  Administered 2016-05-17: 40 mg via INTRAVENOUS

## 2016-05-17 MED ORDER — BISACODYL 10 MG RE SUPP: 10.0000 mg | Freq: Every day | RECTAL | Status: DC | PRN

## 2016-05-17 MED ORDER — LEVOTHYROXINE SODIUM 75 MCG PO TABS
75.0000 ug | ORAL_TABLET | Freq: Every day | ORAL | Status: DC
  Administered 2016-05-18 – 2016-05-20 (×3): 75 ug via ORAL
  Filled 2016-05-17 (×3): qty 1

## 2016-05-17 MED ORDER — DEXAMETHASONE SODIUM PHOSPHATE 10 MG/ML IJ SOLN
10.0000 mg | Freq: Once | INTRAMUSCULAR | Status: AC
  Administered 2016-05-18: 10 mg via INTRAVENOUS
  Filled 2016-05-17: qty 1

## 2016-05-17 MED ORDER — CEFAZOLIN SODIUM-DEXTROSE 2-4 GM/100ML-% IV SOLN
INTRAVENOUS | Status: AC
  Filled 2016-05-17: qty 100

## 2016-05-17 MED ORDER — MIDAZOLAM HCL 5 MG/5ML IJ SOLN
INTRAMUSCULAR | Status: DC | PRN
  Administered 2016-05-17: 2 mg via INTRAVENOUS

## 2016-05-17 MED ORDER — DIAZEPAM 5 MG PO TABS
5.0000 mg | ORAL_TABLET | Freq: Four times a day (QID) | ORAL | Status: DC | PRN
  Administered 2016-05-18: 5 mg via ORAL
  Filled 2016-05-17: qty 1

## 2016-05-17 MED ORDER — KETAMINE HCL 10 MG/ML IJ SOLN
INTRAMUSCULAR | Status: DC | PRN
  Administered 2016-05-17 (×2): 20 mg via INTRAVENOUS

## 2016-05-17 MED ORDER — CHLORHEXIDINE GLUCONATE 4 % EX LIQD: 60.0000 mL | Freq: Once | CUTANEOUS | Status: DC

## 2016-05-17 MED ORDER — SODIUM CHLORIDE 0.9 % IJ SOLN
INTRAMUSCULAR | Status: AC
  Filled 2016-05-17: qty 10

## 2016-05-17 MED ORDER — SCOPOLAMINE 1 MG/3DAYS TD PT72
MEDICATED_PATCH | TRANSDERMAL | Status: DC | PRN
  Administered 2016-05-17: 1 via TRANSDERMAL

## 2016-05-17 MED ORDER — METOCLOPRAMIDE HCL 5 MG PO TABS
5.0000 mg | ORAL_TABLET | Freq: Three times a day (TID) | ORAL | Status: DC | PRN
  Filled 2016-05-17: qty 2

## 2016-05-17 MED ORDER — LIDOCAINE HCL (CARDIAC) 20 MG/ML IV SOLN
INTRAVENOUS | Status: DC | PRN
  Administered 2016-05-17: 80 mg via INTRAVENOUS

## 2016-05-17 MED ORDER — DIPHENHYDRAMINE HCL 12.5 MG/5ML PO ELIX: 12.5000 mg | ORAL_SOLUTION | ORAL | Status: DC | PRN

## 2016-05-17 MED ORDER — CEFAZOLIN SODIUM-DEXTROSE 2-4 GM/100ML-% IV SOLN
2.0000 g | Freq: Four times a day (QID) | INTRAVENOUS | Status: AC
  Administered 2016-05-17 (×2): 2 g via INTRAVENOUS
  Filled 2016-05-17 (×2): qty 100

## 2016-05-17 MED ORDER — SUGAMMADEX SODIUM 200 MG/2ML IV SOLN
INTRAVENOUS | Status: DC | PRN
  Administered 2016-05-17: 160 mg via INTRAVENOUS

## 2016-05-17 MED ORDER — SODIUM CHLORIDE 0.9 % IR SOLN
Status: DC | PRN
  Administered 2016-05-17: 1000 mL

## 2016-05-17 MED ORDER — ONDANSETRON HCL 4 MG PO TABS: 4.0000 mg | ORAL_TABLET | Freq: Four times a day (QID) | ORAL | Status: DC | PRN

## 2016-05-17 MED ORDER — GABAPENTIN 300 MG PO CAPS
600.0000 mg | ORAL_CAPSULE | Freq: Three times a day (TID) | ORAL | Status: DC
  Administered 2016-05-17 – 2016-05-20 (×10): 600 mg via ORAL
  Filled 2016-05-17 (×10): qty 2

## 2016-05-17 MED ORDER — FENTANYL CITRATE (PF) 100 MCG/2ML IJ SOLN
INTRAMUSCULAR | Status: AC
  Filled 2016-05-17: qty 2

## 2016-05-17 MED ORDER — CARVEDILOL 6.25 MG PO TABS
6.2500 mg | ORAL_TABLET | Freq: Two times a day (BID) | ORAL | Status: DC
  Administered 2016-05-17 – 2016-05-20 (×6): 6.25 mg via ORAL
  Filled 2016-05-17 (×6): qty 1

## 2016-05-17 MED ORDER — ACETAMINOPHEN 10 MG/ML IV SOLN
INTRAVENOUS | Status: DC | PRN
  Administered 2016-05-17: 1000 mg via INTRAVENOUS

## 2016-05-17 MED ORDER — FUROSEMIDE 40 MG PO TABS
40.0000 mg | ORAL_TABLET | Freq: Every day | ORAL | Status: DC
  Administered 2016-05-17 – 2016-05-19 (×3): 40 mg via ORAL
  Filled 2016-05-17 (×3): qty 1

## 2016-05-17 MED ORDER — FLEET ENEMA 7-19 GM/118ML RE ENEM: 1.0000 | ENEMA | Freq: Once | RECTAL | Status: DC | PRN

## 2016-05-17 MED ORDER — SODIUM CHLORIDE 0.9 % IJ SOLN
INTRAMUSCULAR | Status: AC
  Filled 2016-05-17: qty 50

## 2016-05-17 MED ORDER — BUPIVACAINE HCL (PF) 0.25 % IJ SOLN
INTRAMUSCULAR | Status: AC
  Filled 2016-05-17: qty 30

## 2016-05-17 MED ORDER — HYDROMORPHONE HCL 1 MG/ML IJ SOLN
INTRAMUSCULAR | Status: AC
  Filled 2016-05-17: qty 1

## 2016-05-17 MED ORDER — HYDROMORPHONE HCL 1 MG/ML IJ SOLN: 0.5000 mg | INTRAMUSCULAR | Status: DC | PRN

## 2016-05-17 MED ORDER — ONDANSETRON HCL 4 MG/2ML IJ SOLN
INTRAMUSCULAR | Status: AC
  Filled 2016-05-17: qty 2

## 2016-05-17 MED ORDER — BUPROPION HCL ER (SR) 100 MG PO TB12
100.0000 mg | ORAL_TABLET | Freq: Every day | ORAL | Status: DC
  Administered 2016-05-18 – 2016-05-20 (×3): 100 mg via ORAL
  Filled 2016-05-17 (×3): qty 1

## 2016-05-17 MED ORDER — ONDANSETRON HCL 4 MG/2ML IJ SOLN: 4.0000 mg | Freq: Four times a day (QID) | INTRAMUSCULAR | Status: DC | PRN

## 2016-05-17 MED ORDER — KETAMINE HCL 10 MG/ML IJ SOLN
INTRAMUSCULAR | Status: AC
  Filled 2016-05-17: qty 1

## 2016-05-17 MED ORDER — HYDROMORPHONE HCL 1 MG/ML IJ SOLN
0.2500 mg | INTRAMUSCULAR | Status: DC | PRN
  Administered 2016-05-17 (×4): 0.25 mg via INTRAVENOUS

## 2016-05-17 MED ORDER — SODIUM CHLORIDE 0.9 % IJ SOLN
INTRAMUSCULAR | Status: DC | PRN
  Administered 2016-05-17: 30 mL via INTRAVENOUS

## 2016-05-17 MED ORDER — ONDANSETRON HCL 4 MG/2ML IJ SOLN
INTRAMUSCULAR | Status: DC | PRN
  Administered 2016-05-17: 4 mg via INTRAVENOUS

## 2016-05-17 MED ORDER — METOCLOPRAMIDE HCL 5 MG/ML IJ SOLN: 5.0000 mg | Freq: Three times a day (TID) | INTRAMUSCULAR | Status: DC | PRN

## 2016-05-17 MED ORDER — CEFAZOLIN SODIUM-DEXTROSE 2-4 GM/100ML-% IV SOLN
2.0000 g | INTRAVENOUS | Status: AC
  Administered 2016-05-17: 2 g via INTRAVENOUS

## 2016-05-17 MED ORDER — ACETAMINOPHEN 650 MG RE SUPP: 650.0000 mg | Freq: Four times a day (QID) | RECTAL | Status: DC | PRN

## 2016-05-17 MED ORDER — FLUTICASONE PROPIONATE 50 MCG/ACT NA SUSP
2.0000 | Freq: Every day | NASAL | Status: DC | PRN
Start: 2016-05-17 — End: 2016-05-20
  Filled 2016-05-17: qty 16

## 2016-05-17 MED ORDER — POLYETHYLENE GLYCOL 3350 17 G PO PACK: 17.0000 g | PACK | Freq: Every day | ORAL | Status: DC | PRN

## 2016-05-17 MED ORDER — BUPIVACAINE LIPOSOME 1.3 % IJ SUSP
20.0000 mL | Freq: Once | INTRAMUSCULAR | Status: DC
  Filled 2016-05-17: qty 20

## 2016-05-17 MED ORDER — PROPOFOL 10 MG/ML IV BOLUS
INTRAVENOUS | Status: DC | PRN
Start: 2016-05-17 — End: 2016-05-17
  Administered 2016-05-17: 180 mg via INTRAVENOUS

## 2016-05-17 MED ORDER — DOCUSATE SODIUM 100 MG PO CAPS
100.0000 mg | ORAL_CAPSULE | Freq: Two times a day (BID) | ORAL | Status: DC
  Administered 2016-05-17 – 2016-05-20 (×6): 100 mg via ORAL
  Filled 2016-05-17 (×6): qty 1

## 2016-05-17 MED ORDER — BUPIVACAINE-EPINEPHRINE (PF) 0.5% -1:200000 IJ SOLN
INTRAMUSCULAR | Status: DC | PRN
  Administered 2016-05-17: 30 mL via PERINEURAL

## 2016-05-17 MED ORDER — DEXAMETHASONE SODIUM PHOSPHATE 10 MG/ML IJ SOLN
10.0000 mg | Freq: Once | INTRAMUSCULAR | Status: AC
  Administered 2016-05-17: 10 mg via INTRAVENOUS

## 2016-05-17 MED ORDER — RIVAROXABAN 10 MG PO TABS
10.0000 mg | ORAL_TABLET | Freq: Every day | ORAL | Status: DC
  Administered 2016-05-18 – 2016-05-20 (×3): 10 mg via ORAL
  Filled 2016-05-17 (×4): qty 1

## 2016-05-17 MED ORDER — LACTATED RINGERS IV SOLN
INTRAVENOUS | Status: DC | PRN
  Administered 2016-05-17: 11:00:00 via INTRAVENOUS

## 2016-05-17 MED ORDER — ROCURONIUM BROMIDE 100 MG/10ML IV SOLN
INTRAVENOUS | Status: AC
  Filled 2016-05-17: qty 1

## 2016-05-17 MED ORDER — METHADONE HCL 5 MG PO TABS
5.0000 mg | ORAL_TABLET | Freq: Three times a day (TID) | ORAL | Status: DC
  Administered 2016-05-17 – 2016-05-20 (×10): 5 mg via ORAL
  Filled 2016-05-17 (×10): qty 1

## 2016-05-17 MED ORDER — BUPIVACAINE HCL 0.25 % IJ SOLN
INTRAMUSCULAR | Status: DC | PRN
  Administered 2016-05-17: 30 mL

## 2016-05-17 MED ORDER — FENTANYL CITRATE (PF) 100 MCG/2ML IJ SOLN
INTRAMUSCULAR | Status: DC | PRN
Start: 2016-05-17 — End: 2016-05-17
  Administered 2016-05-17 (×2): 50 ug via INTRAVENOUS

## 2016-05-17 SURGICAL SUPPLY — 52 items
BAG DECANTER FOR FLEXI CONT (MISCELLANEOUS) ×3 IMPLANT
BAG SPEC THK2 15X12 ZIP CLS (MISCELLANEOUS) ×1
BAG ZIPLOCK 12X15 (MISCELLANEOUS) ×3 IMPLANT
BANDAGE ACE 6X5 VEL STRL LF (GAUZE/BANDAGES/DRESSINGS) ×3 IMPLANT
BLADE SAG 18X100X1.27 (BLADE) ×3 IMPLANT
BLADE SAW SGTL 11.0X1.19X90.0M (BLADE) ×3 IMPLANT
BOWL SMART MIX CTS (DISPOSABLE) ×3 IMPLANT
CAP KNEE TOTAL 3 SIGMA ×2 IMPLANT
CEMENT HV SMART SET (Cement) ×6 IMPLANT
CLOSURE WOUND 1/2 X4 (GAUZE/BANDAGES/DRESSINGS) ×2
CLOTH BEACON ORANGE TIMEOUT ST (SAFETY) ×3 IMPLANT
CUFF TOURN SGL QUICK 34 (TOURNIQUET CUFF) ×3
CUFF TRNQT CYL 34X4X40X1 (TOURNIQUET CUFF) ×1 IMPLANT
DECANTER SPIKE VIAL GLASS SM (MISCELLANEOUS) ×3 IMPLANT
DRAPE U-SHAPE 47X51 STRL (DRAPES) ×3 IMPLANT
DRSG ADAPTIC 3X8 NADH LF (GAUZE/BANDAGES/DRESSINGS) ×3 IMPLANT
DRSG PAD ABDOMINAL 8X10 ST (GAUZE/BANDAGES/DRESSINGS) ×3 IMPLANT
DURAPREP 26ML APPLICATOR (WOUND CARE) ×3 IMPLANT
ELECT REM PT RETURN 9FT ADLT (ELECTROSURGICAL) ×3
ELECTRODE REM PT RTRN 9FT ADLT (ELECTROSURGICAL) ×1 IMPLANT
EVACUATOR 1/8 PVC DRAIN (DRAIN) ×3 IMPLANT
GAUZE SPONGE 4X4 12PLY STRL (GAUZE/BANDAGES/DRESSINGS) ×3 IMPLANT
GLOVE BIO SURGEON STRL SZ7.5 (GLOVE) IMPLANT
GLOVE BIO SURGEON STRL SZ8 (GLOVE) ×1 IMPLANT
GLOVE BIOGEL PI IND STRL 6.5 (GLOVE) IMPLANT
GLOVE BIOGEL PI IND STRL 8 (GLOVE) ×1 IMPLANT
GLOVE BIOGEL PI INDICATOR 6.5 (GLOVE) ×8
GLOVE BIOGEL PI INDICATOR 8 (GLOVE)
GLOVE SURG SS PI 6.5 STRL IVOR (GLOVE) ×4 IMPLANT
GOWN STRL REUS W/TWL LRG LVL3 (GOWN DISPOSABLE) ×3 IMPLANT
GOWN STRL REUS W/TWL XL LVL3 (GOWN DISPOSABLE) IMPLANT
HANDPIECE INTERPULSE COAX TIP (DISPOSABLE) ×3
IMMOBILIZER KNEE 20 (SOFTGOODS) ×3
IMMOBILIZER KNEE 20 THIGH 36 (SOFTGOODS) ×1 IMPLANT
LIQUID BAND (GAUZE/BANDAGES/DRESSINGS) ×2 IMPLANT
MANIFOLD NEPTUNE II (INSTRUMENTS) ×3 IMPLANT
NS IRRIG 1000ML POUR BTL (IV SOLUTION) ×3 IMPLANT
PACK TOTAL KNEE CUSTOM (KITS) ×3 IMPLANT
PADDING CAST COTTON 6X4 STRL (CAST SUPPLIES) ×7 IMPLANT
POSITIONER SURGICAL ARM (MISCELLANEOUS) ×3 IMPLANT
SET HNDPC FAN SPRY TIP SCT (DISPOSABLE) ×1 IMPLANT
STRIP CLOSURE SKIN 1/2X4 (GAUZE/BANDAGES/DRESSINGS) ×4 IMPLANT
SUT MNCRL AB 4-0 PS2 18 (SUTURE) ×3 IMPLANT
SUT VIC AB 2-0 CT1 27 (SUTURE) ×9
SUT VIC AB 2-0 CT1 TAPERPNT 27 (SUTURE) ×3 IMPLANT
SUT VLOC 180 0 24IN GS25 (SUTURE) ×3 IMPLANT
SYR 50ML LL SCALE MARK (SYRINGE) ×1 IMPLANT
TRAY FOLEY W/METER SILVER 14FR (SET/KITS/TRAYS/PACK) IMPLANT
TRAY FOLEY W/METER SILVER 16FR (SET/KITS/TRAYS/PACK) IMPLANT
WATER STERILE IRR 1500ML POUR (IV SOLUTION) ×3 IMPLANT
WRAP KNEE MAXI GEL POST OP (GAUZE/BANDAGES/DRESSINGS) ×3 IMPLANT
YANKAUER SUCT BULB TIP 10FT TU (MISCELLANEOUS) ×3 IMPLANT

## 2016-05-17 NOTE — Progress Notes (Signed)
AssistedDr. Delma Post with left, ultrasound guided, adductor canal  block. Side rails up, monitors on throughout procedure. See vital signs in flow sheet. Tolerated Procedure well.

## 2016-05-17 NOTE — Interval H&P Note (Signed)
History and Physical Interval Note:  05/17/2016 10:33 AM  [name] [name]  has presented today for surgery, with the diagnosis of Left Knee Osteoarthritis  The various methods of treatment have been discussed with the patient and family. After consideration of risks, benefits and other options for treatment, the patient has consented to  Procedure(s): LEFT TOTAL KNEE ARTHROPLASTY (Left) as a surgical intervention .  The patient's history has been reviewed, patient examined, no change in status, stable for surgery.  I have reviewed the patient's chart and labs.  Questions were answered to the patient's satisfaction.     Gearlean Alf

## 2016-05-17 NOTE — Anesthesia Postprocedure Evaluation (Signed)
Anesthesia Post Note  Patient: [name] [name]  Procedure(s) Performed: Procedure(s) (LRB): LEFT TOTAL KNEE ARTHROPLASTY (Left)  Patient location during evaluation: PACU Anesthesia Type: General and Regional Level of consciousness: awake and alert Pain management: pain level controlled Vital Signs Assessment: post-procedure vital signs reviewed and stable Respiratory status: spontaneous breathing, nonlabored ventilation, respiratory function stable and patient connected to nasal cannula oxygen Cardiovascular status: blood pressure returned to baseline and stable Postop Assessment: no signs of nausea or vomiting Anesthetic complications: no    Last Vitals:  Filed Vitals:   05/17/16 1445 05/17/16 1456  BP: 132/68 132/61  Pulse: 66 71  Temp:  36.4 C  Resp: 12 16    Last Pain:  Filed Vitals:   05/17/16 1459  PainSc: 5                  Ruchel Brandenburger J

## 2016-05-17 NOTE — Anesthesia Preprocedure Evaluation (Addendum)
Anesthesia Evaluation  Patient identified by MRN, date of birth, ID band Patient awake    Reviewed: Allergy & Precautions, NPO status , Patient's Chart, lab work & pertinent test results  History of Anesthesia Complications (+) PONV and history of anesthetic complications  Airway Mallampati: II  TM Distance: >3 FB Neck ROM: Full    Dental no notable dental hx.    Pulmonary pneumonia,    Pulmonary exam normal breath sounds clear to auscultation       Cardiovascular hypertension, Pt. on medications and Pt. on home beta blockers Normal cardiovascular exam Rhythm:Regular Rate:Normal     Neuro/Psych PSYCHIATRIC DISORDERS  Neuromuscular disease    GI/Hepatic negative GI ROS, Neg liver ROS,   Endo/Other  Hypothyroidism   Renal/GU negative Renal ROS  negative genitourinary   Musculoskeletal  (+) Arthritis ,   Abdominal (+) + obese,   Peds negative pediatric ROS (+)  Hematology  (+) anemia ,   Anesthesia Other Findings   Reproductive/Obstetrics negative OB ROS                            Anesthesia Physical Anesthesia Plan  ASA: II  Anesthesia Plan: General   Post-op Pain Management: GA combined w/ Regional for post-op pain   Induction: Intravenous  Airway Management Planned: Oral ETT  Additional Equipment:   Intra-op Plan:   Post-operative Plan: Extubation in OR  Informed Consent: I have reviewed the patients History and Physical, chart, labs and discussed the procedure including the risks, benefits and alternatives for the proposed anesthesia with the patient or authorized representative who has indicated his/her understanding and acceptance.   Dental advisory given  Plan Discussed with: CRNA  Anesthesia Plan Comments: (Discussed risks of adductor canal block including failure, bleeding, infection, nerve damage.  Adductor canal block does not usually prevent all pain.  Specifically, it treats the anterior, but often not the posterior knee. Questions answered.  Patient consents to block.)        Anesthesia Quick Evaluation

## 2016-05-17 NOTE — Transfer of Care (Signed)
Immediate Anesthesia Transfer of Care Note  Patient: [name] [name]  Procedure(s) Performed: Procedure(s): LEFT TOTAL KNEE ARTHROPLASTY (Left)  Patient Location: PACU  Anesthesia Type:General  Level of Consciousness: sedated, patient cooperative and responds to stimulation  Airway & Oxygen Therapy: Patient Spontanous Breathing and Patient connected to T-piece oxygen  Post-op Assessment: Report given to RN and Post -op Vital signs reviewed and stable  Post vital signs: Reviewed and stable  Last Vitals:  Filed Vitals:   05/17/16 0856  BP: 129/54  Pulse: 72  Temp: 36.8 C  Resp: 18    Last Pain: There were no vitals filed for this visit.    Patients Stated Pain Goal: 4 ([id number])  Complications: No apparent anesthesia complications

## 2016-05-17 NOTE — Anesthesia Procedure Notes (Addendum)
Anesthesia Regional Block:  Adductor canal block  Pre-Anesthetic Checklist: ,, timeout performed, Correct Patient, Correct Site, Correct Laterality, Correct Procedure, Correct Position, site marked, Risks and benefits discussed,  Surgical consent,  Pre-op evaluation,  At surgeon's request and post-op pain management  Laterality: Lower and Left  Prep: chloraprep       Needles:  Injection technique: Single-shot  Needle Type: Echogenic Needle     Needle Length: 10cm 10 cm Needle Gauge: 21 and 21 G    Additional Needles:  Procedures: ultrasound guided (picture in chart) Adductor canal block  Nerve Stimulator or Paresthesia:  Response: 0.6 mA,   Additional Responses:   Narrative:  Injection made incrementally with aspirations every 5 mL.  Performed by: Personally  Anesthesiologist: Franne Grip  Additional Notes: No pain on injection. No increased resistance to injection. Motor intact immediately after block.   Procedure Name: Intubation Date/Time: 05/17/2016 11:43 AM Performed by: Glory Buff Pre-anesthesia Checklist: Patient identified, Emergency Drugs available, Suction available, Patient being monitored and Timeout performed Patient Re-evaluated:Patient Re-evaluated prior to inductionOxygen Delivery Method: Circle system utilized Preoxygenation: Pre-oxygenation with 100% oxygen Intubation Type: IV induction Ventilation: Mask ventilation without difficulty Grade View: Grade I Tube type: Oral Tube size: 7.0 mm Number of attempts: 1 Airway Equipment and Method: Patient positioned with wedge pillow and Stylet Placement Confirmation: ETT inserted through vocal cords under direct vision,  positive ETCO2 and breath sounds checked- equal and bilateral Secured at: 21 cm Tube secured with: Tape

## 2016-05-17 NOTE — Op Note (Signed)
Pre-operative diagnosis- Osteoarthritis  Left knee(s)  Post-operative diagnosis- Osteoarthritis Left knee(s)  Procedure-  Left  Total Knee Arthroplasty  Surgeon- Dione Plover. Saleemah Mollenhauer, MD  Assistant- Ardeen Jourdain, PA-C   Anesthesia-  General and adductor canal block  EBL-* No blood loss amount entered *   Drains Hemovac  Tourniquet time-  Total Tourniquet Time Documented: Thigh (Left) - 34 minutes Total: Thigh (Left) - 34 minutes     Complications- None  Condition-PACU - hemodynamically stable.   Brief Clinical Note  [name] [name] is a 75 y.o. year old female with end stage OA of her left knee with progressively worsening pain and dysfunction. She has constant pain, with activity and at rest and significant functional deficits with difficulties even with ADLs. She has had extensive non-op management including analgesics, injections of cortisone and viscosupplements, and home exercise program, but remains in significant pain with significant dysfunction. Radiographs show bone on bone arthritis medial and patellofemoral. She presents now for left Total Knee Arthroplasty.    Procedure in detail---   The patient is brought into the operating room and positioned supine on the operating table. After successful administration of  adductor canal block then geneeral,   a tourniquet is placed high on the  Left thigh(s) and the lower extremity is prepped and draped in the usual sterile fashion. Time out is performed by the operating team and then the  Left lower extremity is wrapped in Esmarch, knee flexed and the tourniquet inflated to 300 mmHg.       A midline incision is made with a ten blade through the subcutaneous tissue to the level of the extensor mechanism. A fresh blade is used to make a medial parapatellar arthrotomy. Soft tissue over the proximal medial tibia is subperiosteally elevated to the joint line with a knife and into the semimembranosus bursa with a Cobb elevator. Soft tissue over  the proximal lateral tibia is elevated with attention being paid to avoiding the patellar tendon on the tibial tubercle. The patella is everted, knee flexed 90 degrees and the ACL and PCL are removed. Findings are bone on bone medial and patellofemoral with large global osteophytes.        The drill is used to create a starting hole in the distal femur and the canal is thoroughly irrigated with sterile saline to remove the fatty contents. The 5 degree Left  valgus alignment guide is placed into the femoral canal and the distal femoral cutting block is pinned to remove 10 mm off the distal femur. Resection is made with an oscillating saw.      The tibia is subluxed forward and the menisci are removed. The extramedullary alignment guide is placed referencing proximally at the medial aspect of the tibial tubercle and distally along the second metatarsal axis and tibial crest. The block is pinned to remove 79mm off the more deficient medial  side. Resection is made with an oscillating saw. Size 3is the most appropriate size for the tibia and the proximal tibia is prepared with the modular drill and keel punch for that size.      The femoral sizing guide is placed and size 4 is most appropriate. Rotation is marked off the epicondylar axis and confirmed by creating a rectangular flexion gap at 90 degrees. The size 4 cutting block is pinned in this rotation and the anterior, posterior and chamfer cuts are made with the oscillating saw. The intercondylar block is then placed and that cut is made.  Trial size 3 tibial component, trial size 4 narrow posterior stabilized femur and a 12.5  mm posterior stabilized rotating platform insert trial is placed. Full extension is achieved with excellent varus/valgus and anterior/posterior balance throughout full range of motion. The patella is everted and thickness measured to be 24  mm. Free hand resection is taken to 14 mm, a 38 template is placed, lug holes are drilled, trial  patella is placed, and it tracks normally. Osteophytes are removed off the posterior femur with the trial in place. All trials are removed and the cut bone surfaces prepared with pulsatile lavage. Cement is mixed and once ready for implantation, the size 3 tibial implant, size  4 narrow posterior stabilized femoral component, and the size 38 patella are cemented in place and the patella is held with the clamp. The trial insert is placed and the knee held in full extension. The Exparel (20 ml mixed with 30 ml saline) and .25% Bupivicaine, are injected into the extensor mechanism, posterior capsule, medial and lateral gutters and subcutaneous tissues.  All extruded cement is removed and once the cement is hard the permanent 12.5 mm posterior stabilized rotating platform insert is placed into the tibial tray.      The wound is copiously irrigated with saline solution and the extensor mechanism closed over a hemovac drain with #1 V-loc suture. The tourniquet is released for a total tourniquet time of 34  minutes. Flexion against gravity is 140 degrees and the patella tracks normally. Subcutaneous tissue is closed with 2.0 vicryl and subcuticular with running 4.0 Monocryl. The incision is cleaned and dried and steri-strips and a bulky sterile dressing are applied. The limb is placed into a knee immobilizer and the patient is awakened and transported to recovery in stable condition.      Please note that a surgical assistant was a medical necessity for this procedure in order to perform it in a safe and expeditious manner. Surgical assistant was necessary to retract the ligaments and vital neurovascular structures to prevent injury to them and also necessary for proper positioning of the limb to allow for anatomic placement of the prosthesis.   Dione Plover Devean Skoczylas, MD    05/17/2016, 12:54 PM

## 2016-05-18 ENCOUNTER — Inpatient Hospital Stay (HOSPITAL_COMMUNITY): Payer: Medicare Other

## 2016-05-18 LAB — BASIC METABOLIC PANEL
ANION GAP: 6 (ref 5–15)
BUN: 12 mg/dL (ref 6–20)
CALCIUM: 8.4 mg/dL — AB (ref 8.9–10.3)
CHLORIDE: 101 mmol/L (ref 101–111)
CO2: 33 mmol/L — AB (ref 22–32)
CREATININE: 0.81 mg/dL (ref 0.44–1.00)
Glucose, Bld: 117 mg/dL — ABNORMAL HIGH (ref 65–99)
Potassium: 3.7 mmol/L (ref 3.5–5.1)
SODIUM: 140 mmol/L (ref 135–145)

## 2016-05-18 LAB — CBC
HEMATOCRIT: 31.2 % — AB (ref 36.0–46.0)
HEMOGLOBIN: 10.8 g/dL — AB (ref 12.0–15.0)
MCH: 31.2 pg (ref 26.0–34.0)
MCHC: 34.6 g/dL (ref 30.0–36.0)
MCV: 90.2 fL (ref 78.0–100.0)
Platelets: 210 10*3/uL (ref 150–400)
RBC: 3.46 MIL/uL — ABNORMAL LOW (ref 3.87–5.11)
RDW: 13.6 % (ref 11.5–15.5)
WBC: 11.3 10*3/uL — AB (ref 4.0–10.5)

## 2016-05-18 LAB — TROPONIN I: Troponin I: <0.03 ng/mL (ref <0.031)

## 2016-05-18 MED ORDER — HYDROMORPHONE HCL 2 MG PO TABS: 2.0000 mg | ORAL_TABLET | ORAL | Status: DC | PRN

## 2016-05-18 MED ORDER — RIVAROXABAN 10 MG PO TABS: 10.0000 mg | ORAL_TABLET | Freq: Every day | ORAL | Status: DC

## 2016-05-18 NOTE — Progress Notes (Signed)
Pt remains to be in a rate of 140s with periods of desatting into the 70s and 80s on 3 liters. Pt denies CP, SOB, nausea, dizziness while at rest. EKG performed per MD's order shows ST rates in 140s and Troponin has resulted at <0.03. RRT-RN called at this time as HR remains elevated and pt have frequent periods of desatting while at rest and pt is not on telemetry as it is not available in this department. This RN has been in communication with PA on call and he has been made aware of pt condition. Will continue to monitor pt. RRT-RN at pt's bedside currently

## 2016-05-18 NOTE — Discharge Instructions (Addendum)
° °Dr. Frank Aluisio °Total Joint Specialist °[facility] °[address] °, [place] [address] °[phone] ° °TOTAL KNEE REPLACEMENT POSTOPERATIVE DIRECTIONS ° °Knee Rehabilitation, Guidelines Following Surgery  °Results after knee surgery are often greatly improved when you follow the exercise, range of motion and muscle strengthening exercises prescribed by your doctor. Safety measures are also important to protect the knee from further injury. Any time any of these exercises cause you to have increased pain or swelling in your knee joint, decrease the amount until you are comfortable again and slowly increase them. If you have problems or questions, call your caregiver or physical therapist for advice.  ° °HOME CARE INSTRUCTIONS  °Remove items at home which could result in a fall. This includes throw rugs or furniture in walking pathways.  °· ICE to the affected knee every three hours for 30 minutes at a time and then as needed for pain and swelling.  Continue to use ice on the knee for pain and swelling from surgery. You may notice swelling that will progress down to the foot and ankle.  This is normal after surgery.  Elevate the leg when you are not up walking on it.   °· Continue to use the breathing machine which will help keep your temperature down.  It is common for your temperature to cycle up and down following surgery, especially at night when you are not up moving around and exerting yourself.  The breathing machine keeps your lungs expanded and your temperature down. °· Do not place pillow under knee, focus on keeping the knee straight while resting ° °DIET °You may resume your previous home diet once your are discharged from the hospital. ° °DRESSING / WOUND CARE / SHOWERING °You may start showering once you are discharged home but do not submerge the incision under water. Just pat the incision dry and apply a dry gauze dressing on daily. °Change the surgical dressing  daily and reapply a dry dressing each time. ° °ACTIVITY °Walk with your walker as instructed. °Use walker as long as suggested by your caregivers. °Avoid periods of inactivity such as sitting longer than an hour when not asleep. This helps prevent blood clots.  °You may resume a sexual relationship in one month or when given the OK by your doctor.  °You may return to work once you are cleared by your doctor.  °Do not drive a car for 6 weeks or until released by you surgeon.  °Do not drive while taking narcotics. ° °WEIGHT BEARING °Weight bearing as tolerated with assist device (walker, cane, etc) as directed, use it as long as suggested by your surgeon or therapist, typically at least 4-6 weeks. ° °POSTOPERATIVE CONSTIPATION PROTOCOL °Constipation - defined medically as fewer than three stools per week and severe constipation as less than one stool per week. ° °One of the most common issues patients have following surgery is constipation.  Even if you have a regular bowel pattern at home, your normal regimen is likely to be disrupted due to multiple reasons following surgery.  Combination of anesthesia, postoperative narcotics, change in appetite and fluid intake all can affect your bowels.  In order to avoid complications following surgery, here are some recommendations in order to help you during your recovery period. ° °Colace (docusate) - Pick up an over-the-counter form of Colace or another stool softener and take twice a day as long as you are requiring postoperative pain medications.  Take with a full glass of water   daily.  If you experience loose stools or diarrhea, hold the colace until you stool forms back up.  If your symptoms do not get better within 1 week or if they get worse, check with your doctor. ° °Dulcolax (bisacodyl) - Pick up over-the-counter and take as directed by the product packaging as needed to assist with the movement of your bowels.  Take with a full glass of water.  Use this product as  needed if not relieved by Colace only.  ° °MiraLax (polyethylene glycol) - Pick up over-the-counter to have on hand.  MiraLax is a solution that will increase the amount of water in your bowels to assist with bowel movements.  Take as directed and can mix with a glass of water, juice, soda, coffee, or tea.  Take if you go more than two days without a movement. °Do not use MiraLax more than once per day. Call your doctor if you are still constipated or irregular after using this medication for 7 days in a row. ° °If you continue to have problems with postoperative constipation, please contact the office for further assistance and recommendations.  If you experience "the worst abdominal pain ever" or develop nausea or vomiting, please contact the office immediatly for further recommendations for treatment. ° °ITCHING ° If you experience itching with your medications, try taking only a single pain pill, or even half a pain pill at a time.  You can also use Benadryl over the counter for itching or also to help with sleep.  ° °TED HOSE STOCKINGS °Wear the elastic stockings on both legs for three weeks following surgery during the day but you may remove then at night for sleeping. ° °MEDICATIONS °See your medication summary on the “After Visit Summary” that the nursing staff will review with you prior to discharge.  You may have some home medications which will be placed on hold until you complete the course of blood thinner medication.  It is important for you to complete the blood thinner medication as prescribed by your surgeon.  Continue your approved medications as instructed at time of discharge. ° °PRECAUTIONS °If you experience chest pain or shortness of breath - call 911 immediately for transfer to the hospital emergency department.  °If you develop a fever greater that 101 F, purulent drainage from wound, increased redness or drainage from wound, foul odor from the wound/dressing, or calf pain - CONTACT YOUR  SURGEON.   °                                                °FOLLOW-UP APPOINTMENTS °Make sure you keep all of your appointments after your operation with your surgeon and caregivers. You should call the office at the above phone number and make an appointment for approximately two weeks after the date of your surgery or on the date instructed by your surgeon outlined in the "After Visit Summary". ° ° °RANGE OF MOTION AND STRENGTHENING EXERCISES  °Rehabilitation of the knee is important following a knee injury or an operation. After just a few days of immobilization, the muscles of the thigh which control the knee become weakened and shrink (atrophy). Knee exercises are designed to build up the tone and strength of the thigh muscles and to improve knee motion. Often times heat used for twenty to thirty minutes before working out will loosen   up your tissues and help with improving the range of motion but do not use heat for the first two weeks following surgery. These exercises can be done on a training (exercise) mat, on the floor, on a table or on a bed. Use what ever works the best and is most comfortable for you Knee exercises include:  °Leg Lifts - While your knee is still immobilized in a splint or cast, you can do straight leg raises. Lift the leg to 60 degrees, hold for 3 sec, and slowly lower the leg. Repeat 10-20 times 2-3 times daily. Perform this exercise against resistance later as your knee gets better.  °Quad and Hamstring Sets - Tighten up the muscle on the front of the thigh (Quad) and hold for 5-10 sec. Repeat this 10-20 times hourly. Hamstring sets are done by pushing the foot backward against an object and holding for 5-10 sec. Repeat as with quad sets.  °· Leg Slides: Lying on your back, slowly slide your foot toward your buttocks, bending your knee up off the floor (only go as far as is comfortable). Then slowly slide your foot back down until your leg is flat on the floor again. °· Angel Wings:  Lying on your back spread your legs to the side as far apart as you can without causing discomfort.  °A rehabilitation program following serious knee injuries can speed recovery and prevent re-injury in the future due to weakened muscles. Contact your doctor or a physical therapist for more information on knee rehabilitation.  ° °IF YOU ARE TRANSFERRED TO A SKILLED REHAB FACILITY °If the patient is transferred to a skilled rehab facility following release from the hospital, a list of the current medications will be sent to the facility for the patient to continue.  When discharged from the skilled rehab facility, please have the facility set up the patient's Home Health Physical Therapy prior to being released. Also, the skilled facility will be responsible for providing the patient with their medications at time of release from the facility to include their pain medication, the muscle relaxants, and their blood thinner medication. If the patient is still at the rehab facility at time of the two week follow up appointment, the skilled rehab facility will also need to assist the patient in arranging follow up appointment in our office and any transportation needs. ° °MAKE SURE YOU:  °Understand these instructions.  °Get help right away if you are not doing well or get worse.  ° ° °Pick up stool softner and laxative for home use following surgery while on pain medications. °Do not submerge incision under water. °Please use good hand washing techniques while changing dressing each day. °May shower starting three days after surgery. °Please use a clean towel to pat the incision dry following showers. °Continue to use ice for pain and swelling after surgery. °Do not use any lotions or creams on the incision until instructed by your surgeon. ° °Information on my medicine - XARELTO® (Rivaroxaban) ° °This medication education was reviewed with me or my healthcare representative as part of my discharge preparation.  The  pharmacist that spoke with me during my hospital stay was:  Green, Terri L, RPH ° °Why was Xarelto® prescribed for you? °Xarelto® was prescribed for you to reduce the risk of blood clots forming after orthopedic surgery. The medical term for these abnormal blood clots is venous thromboembolism (VTE). ° °What do you need to know about xarelto® ? °Take your Xarelto® ONCE DAILY at   the same time every day. °You may take it either with or without food. ° °If you have difficulty swallowing the tablet whole, you may crush it and mix in applesauce just prior to taking your dose. ° °Take Xarelto® exactly as prescribed by your doctor and DO NOT stop taking Xarelto® without talking to the doctor who prescribed the medication.  Stopping without other VTE prevention medication to take the place of Xarelto® may increase your risk of developing a clot. ° °After discharge, you should have regular check-up appointments with your healthcare provider that is prescribing your Xarelto®.   ° °What do you do if you miss a dose? °If you miss a dose, take it as soon as you remember on the same day then continue your regularly scheduled once daily regimen the next day. Do not take two doses of Xarelto® on the same day.  ° °Important Safety Information °A possible side effect of Xarelto® is bleeding. You should call your healthcare provider right away if you experience any of the following: °? Bleeding from an injury or your nose that does not stop. °? Unusual colored urine (red or dark brown) or unusual colored stools (red or black). °? Unusual bruising for unknown reasons. °? A serious fall or if you hit your head (even if there is no bleeding). ° °Some medicines may interact with Xarelto® and might increase your risk of bleeding while on Xarelto®. To help avoid this, consult your healthcare provider or pharmacist prior to using any new prescription or non-prescription medications, including herbals, vitamins, non-steroidal  anti-inflammatory drugs (NSAIDs) and supplements. ° °This website has more information on Xarelto®: www.xarelto.com. ° ° ° °

## 2016-05-18 NOTE — Progress Notes (Signed)
Continued to receive calls about patient's desaturation.  She continues to be tachycardic, hypoxic on 3 L O2, and nurses are now describing changes in mentation.  Patient continues to deny chest pain, SOB, or calf pain.   Consult to hospitalist has been arranged for concern of potential pneumonia.  Mechele Claude, PA-C, [employer] Office:  [phone]

## 2016-05-18 NOTE — Progress Notes (Addendum)
Subjective: 1 Day Post-Op Procedure(s) (LRB): LEFT TOTAL KNEE ARTHROPLASTY (Left) Patient reports pain as mild.   Patient seen in rounds with Dr. Wynelle Link. Patient is well, and has had no acute complaints or problems. No issues overnight. No SOB or chest pain.  Plan is to go Home after hospital stay.  Objective: Vital signs in last 24 hours: Temp:  [97.4 F (36.3 C)-98.3 F (36.8 C)] 98.1 F (36.7 C) (06/13 0532) Pulse Rate:  [56-100] 80 (06/13 0532) Resp:  [9-19] 16 (06/13 0532) BP: (109-155)/(46-82) 123/46 mmHg (06/13 0532) SpO2:  [95 %-100 %] 98 % (06/13 0532) FiO2 (%):  [100 %] 100 % (06/12 1339) Weight:  [83.604 kg (184 lb 5 oz)] 83.604 kg (184 lb 5 oz) (06/12 0937)  Intake/Output from previous day:  Intake/Output Summary (Last 24 hours) at 05/18/16 0727 Last data filed at 05/18/16 0534  Gross per 24 hour  Intake 3192.5 ml  Output   2645 ml  Net  547.5 ml     Labs:  Recent Labs  05/18/16 0409  HGB 10.8*    Recent Labs  05/18/16 0409  WBC 11.3*  RBC 3.46*  HCT 31.2*  PLT 210    Recent Labs  05/18/16 0409  NA 140  K 3.7  CL 101  CO2 33*  BUN 12  CREATININE 0.81  GLUCOSE 117*  CALCIUM 8.4*    EXAM General - Patient is Alert and Oriented Extremity - Neurologically intact Intact pulses distally Dorsiflexion/Plantar flexion intact No cellulitis present Compartment soft Dressing - dressing C/D/I Motor Function - intact, moving foot and toes well on exam.  Hemovac pulled without difficulty.  Past Medical History  Diagnosis Date  . Hypertension   . Hypothyroidism   . Chronic pain     lower back  . Bronchitis     history only  . Ulcer     hx  . Virus not detected 2010    postop, caught virus in hosp., respiratory symptoms, upon d/c to home, had allergic reaction /w plate size hives - not sure of the cause    . Sinus infection     recent sinus infection, finished Zpak- 08/16/2013  . Female bladder prolapse   . Anemia     took iron  for a while  . SVD (spontaneous vaginal delivery)     x 2  . Missed ab     x 6 - 2 surgery, 4 resolved on it's own  . Seasonal allergies   . Neuromuscular disorder ([place])     , left foot has some numbeness- uses cane  . PONV (postoperative nausea and vomiting)     states she had scop patch in past,states she sets off alarms when she is waking up, she "forgets to breathe"   . Allergy   . Blood transfusion without reported diagnosis   . Cataract   . Macular degeneration     wet- right eye  dry- left eye  . Hyperlipidemia   . Osteopenia   . Acquired left flat foot   . 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 Numbness     outer edge of heel left foot   . Tinnitus   . Imbalance   . Contusion of left hand   . Pneumonia   . Fracture of metatarsal bone     w nonunion   . Hallux rigidus   . Urinary tract infection   . Urinary incontinence   . Acquired leg length discrepancy   .  Metatarsalgia of left foot   . Trigger middle finger of left hand   . Allergic urticaria   . Shingles   . History of measles   . History of mumps   . History of rubella   . Menopause   . Chronic fatigue syndrome   . Arthritis     lower back, knees    Assessment/Plan: 1 Day Post-Op Procedure(s) (LRB): LEFT TOTAL KNEE ARTHROPLASTY (Left) Principal Problem:   OA (osteoarthritis) of knee  Estimated body mass index is 32.66 kg/(m^2) as calculated from the following:   Height as of this encounter: 5\' 3"  (1.6 m).   Weight as of this encounter: 83.604 kg (184 lb 5 oz). Advance diet Up with therapy D/C IV fluids when tolerating POs well   DVT Prophylaxis - Xarelto Weight-Bearing as tolerated  D/C O2 and Pulse OX and try on Room Air  Therapy today. Will plan for DC home tomorrow if she progresses well.   Ardeen Jourdain, PA-C Orthopaedic Surgery 05/18/2016, 7:27 AM

## 2016-05-18 NOTE — Progress Notes (Signed)
Physical Therapy Treatment Note    05/18/16 1500  PT Visit Information  Last PT Received On 05/18/16  Assistance Needed +1  History of Present Illness Pt is a 75 year old female s/p L TKA  Subjective Data  Subjective Pt assisted to/from bathroom.  Pt very limited due to dizziness.  Pt able to perform LE exercises upon return to bed. Vitals after ambulating back to bed from bathroom: SpO2 93% room air, 100 bpm, 144/49 mmHg  Precautions  Precautions Knee  Required Braces or Orthoses Knee Immobilizer - Left  Restrictions  Other Position/Activity Restrictions WBAT  Pain Assessment  Pain Assessment 0-10  Pain Score 5  Pain Location L knee  Pain Descriptors / Indicators Aching;Sore  Pain Intervention(s) Limited activity within patient's tolerance;Monitored during session;Repositioned;Ice applied  Cognition  Arousal/Alertness Awake/alert  Behavior During Therapy WFL for tasks assessed/performed  Overall Cognitive Status Within Functional Limits for tasks assessed  Bed Mobility  Overal bed mobility Needs Assistance  Bed Mobility Supine to Sit;Sit to Supine  Supine to sit Min assist  Sit to supine Min assist  General bed mobility comments verbal cues for technique, assist for L LE  Transfers  Overall transfer level Needs assistance  Equipment used Rolling walker (2 wheeled)  Transfers Sit to/from Stand  Sit to Stand Min assist  General transfer comment verbal cues for UE and LE positioning  Ambulation/Gait  Ambulation/Gait assistance Min guard  Ambulation Distance (Feet) 10 Feet  Assistive device Rolling walker (2 wheeled)  Gait Pattern/deviations Step-to pattern;Antalgic;Decreased stance time - left  General Gait Details verbal cues for sequence, RW positioning, pt reporting dizziness with ambulation so distance limited  Exercises  Exercises Total Joint  Total Joint Exercises  Ankle Circles/Pumps AROM;Both;10 reps  Quad Sets AROM;Left;10 reps  Heel Slides AAROM;Left;10 reps   Hip ABduction/ADduction AROM;Left;10 reps  Goniometric ROM AAROM knee flexion 35*  PT - End of Session  Equipment Utilized During Treatment Left knee immobilizer  Activity Tolerance Patient limited by fatigue  Patient left in bed;with call bell/phone within reach;with bed alarm set;with family/visitor present  PT - Assessment/Plan  PT Plan Current plan remains appropriate  PT Frequency (ACUTE ONLY) 7X/week  Follow Up Recommendations Home health PT  PT equipment Rolling walker with 5" wheels  PT Goal Progression  Progress towards PT goals Progressing toward goals  PT Time Calculation  PT Start Time (ACUTE ONLY) 1350  PT Stop Time (ACUTE ONLY) 1420  PT Time Calculation (min) (ACUTE ONLY) 30 min  PT General Charges  $$ ACUTE PT VISIT 1 Procedure  PT Treatments  $Gait Training 8-22 mins  $Therapeutic Exercise 8-22 mins   Carmelia Bake, PT, DPT 05/18/2016 Pager: [phone]

## 2016-05-18 NOTE — Progress Notes (Signed)
OT Cancellation Note  Patient Details Name: [name] [name] MRN: [id number] DOB: 14-Jan-1941   Cancelled Treatment:    Reason Eval/Treat Not Completed: PT screened, no needs identified, will sign off. Pt has had multiple orthopedic sxs.  [name] 05/18/2016, 11:56 AM  Lesle Chris, OTR/L [phone] 05/18/2016

## 2016-05-18 NOTE — Care Management Note (Signed)
Case Management Note  Patient Details  Name: Rosell H Bures MRN: [id number] Date of Birth: 08/09/1941  Subjective/Objective:                  LEFT TOTAL KNEE ARTHROPLASTY (Left) Action/Plan: Discharge planning Expected Discharge Date:  05/19/16               Expected Discharge Plan:  Home w Home Health Services  In-House Referral:     Discharge planning Services  CM Consult  Post Acute Care Choice:    Choice offered to:  Patient  DME Arranged:  Walker rolling DME Agency:  [facility]  HH Arranged:  PT HH Agency:  [facility]  Status of Service:  Completed, signed off  Medicare Important Message Given:    Date Medicare IM Given:    Medicare IM give by:    Date Additional Medicare IM Given:    Additional Medicare Important Message give by:     If discussed at Long Length of Stay Meetings, dates discussed:    Additional Comments: CM met with pt to offer choice of home health agency.  Pt chooses [facility]  Referral given to [facility] rep, Tim.  CM called [facility] rep, Karen to please deliver a rolling walker to room prior to discharge.  No other CM needs were communicated. Jeffries, Sarah Christine, RN 05/18/2016, 11:18 AM  

## 2016-05-18 NOTE — Significant Event (Signed)
Rapid Response Event Note  Overview: Time Called: 2215 Arrival Time: 2225 Event Type: Cardiac  Initial Focused Assessment:Patient in bed and appears alert. V/S are stable. Her home meds were reviewed. When asking questions regarding home meds patient falls asleep easily and converstation drifts and rambles. Her heart rate has been >135 for several hours with no fever. She was increased to 3L [place] from room air for sats <90%prior to arrival.  EKG and CXR were done prior PTA.  She has received lasix and diuresed >1800cc. Her husband is at bedside. Troponin was negative.    Interventions:Monitored, reviewed hx and v/s. PA called given update.  Plan of Care (if not transferred):Hospitalist consult   Event Summary: Name of Physician Notified: Duncan Dull PA at 2250    at    Outcome: Stayed in room and stabalized (consult to be made to hospitalist)  Event End Time: 2300  Pricilla Riffle

## 2016-05-18 NOTE — Evaluation (Signed)
Physical Therapy Evaluation Patient Details Name: AREON KAMMER MRN: [id number] DOB: 12-Dec-1940 Today's Date: 05/18/2016   History of Present Illness  Pt is a 75 year old female s/p L TKA  Clinical Impression  Pt is s/p L TKA resulting in the deficits listed below (see PT Problem List).  Pt will benefit from skilled PT to increase their independence and safety with mobility to allow discharge to the venue listed below.  Pt limited by dizziness with mobility POD #1 however plans to d/c home with spouse assist.     Follow Up Recommendations Home health PT    Equipment Recommendations  Rolling walker with 5" wheels    Recommendations for Other Services       Precautions / Restrictions Precautions Precautions: Knee Required Braces or Orthoses: Knee Immobilizer - Left Restrictions Weight Bearing Restrictions: No Other Position/Activity Restrictions: WBAT      Mobility  Bed Mobility Overal bed mobility: Needs Assistance Bed Mobility: Supine to Sit     Supine to sit: Min assist     General bed mobility comments: verbal cues for technique, assist for L LE  Transfers Overall transfer level: Needs assistance Equipment used: Rolling walker (2 wheeled) Transfers: Sit to/from Stand Sit to Stand: Min assist         General transfer comment: verbal cues for UE and LE positioning  Ambulation/Gait Ambulation/Gait assistance: Min assist Ambulation Distance (Feet): 15 Feet Assistive device: Rolling walker (2 wheeled) Gait Pattern/deviations: Step-to pattern;Antalgic     General Gait Details: verbal cues for sequence, step length, RW positioning, pt ambulated to bathroom and then recliner, distance limited to in room due to dizziness  Stairs            Wheelchair Mobility    Modified Rankin (Stroke Patients Only)       Balance                                             Pertinent Vitals/Pain Pain Assessment: 0-10 Pain Score: 6  Pain  Location: L knee Pain Descriptors / Indicators: Sore;Aching Pain Intervention(s): Limited activity within patient's tolerance;Monitored during session;Repositioned;Ice applied;Premedicated before session    Home Living Family/patient expects to be discharged to:: Private residence Living Arrangements: Spouse/significant other Available Help at Discharge: Family Type of Home: House Home Access: Stairs to enter Entrance Stairs-Rails: Right Entrance Stairs-Number of Steps: 5 Home Layout: Able to live on main level with bedroom/bathroom Home Equipment: Bedside commode;Shower seat      Prior Function Level of Independence: Independent with assistive device(s)               Hand Dominance        Extremity/Trunk Assessment               Lower Extremity Assessment: LLE deficits/detail   LLE Deficits / Details: unable to perform SLR, ROM TBA     Communication   Communication: No difficulties  Cognition Arousal/Alertness: Awake/alert Behavior During Therapy: WFL for tasks assessed/performed Overall Cognitive Status: Within Functional Limits for tasks assessed                      General Comments      Exercises        Assessment/Plan    PT Assessment Patient needs continued PT services  PT Diagnosis Difficulty walking;Acute pain   PT Problem  List Decreased strength;Decreased range of motion;Decreased mobility;Decreased activity tolerance;Pain;Decreased knowledge of use of DME  PT Treatment Interventions Gait training;DME instruction;Functional mobility training;Patient/family education;Therapeutic activities;Therapeutic exercise;Stair training   PT Goals (Current goals can be found in the Care Plan section) Acute Rehab PT Goals PT Goal Formulation: With patient Time For Goal Achievement: 05/22/16 Potential to Achieve Goals: Good    Frequency 7X/week   Barriers to discharge        Co-evaluation               End of Session Equipment  Utilized During Treatment: Gait belt Activity Tolerance: Patient limited by fatigue Patient left: in chair;with call bell/phone within reach (pt aware to use call bell for out of chair)           Time: [id number] PT Time Calculation (min) (ACUTE ONLY): 22 min   Charges:   PT Evaluation $PT Eval Low Complexity: 1 Procedure     PT G Codes:        Cobie Leidner,KATHrine E 05/18/2016, 12:12 PM Carmelia Bake, PT, DPT 05/18/2016 Pager: [phone]

## 2016-05-18 NOTE — Progress Notes (Addendum)
Call from nurse regarding periods of desaturation and intermittent tachycardia.   Patient is day 1 post-op S/P L TKR. Patient denies SOB, chest pain, or calf pain.  Patient is on xeralto for DVT prophylaxis.  PMH significant for HTN for which she is on carvedilol, however denies history of A-fib, blood clots, stroke, MI, other cardiac related pathology.  EKG was obtained, however it was not uploaded and images are not available. Troponin was ordered and negative.  CXR was ordered with suspicion for post-op atelectasis due to pain and splinting.  CXR demonstrates mild left basilar airspace opacity that could reflect atelectasis or mild pneumonia.  Patient is encouraged to utilize incentive spirometry, and to get up and move about as able.  Another CXR to be ordered in the morning, to be checked and r/o airspace opacity progression.  Patient to utilize O2 prn.    Mechele Claude, PA-C, [facility] [employer] Office:  [phone]

## 2016-05-19 ENCOUNTER — Inpatient Hospital Stay (HOSPITAL_COMMUNITY): Payer: Medicare Other

## 2016-05-19 ENCOUNTER — Encounter (HOSPITAL_COMMUNITY): Payer: Self-pay

## 2016-05-19 DIAGNOSIS — E876 Hypokalemia: Secondary | ICD-10-CM

## 2016-05-19 DIAGNOSIS — M179 Osteoarthritis of knee, unspecified: Secondary | ICD-10-CM

## 2016-05-19 DIAGNOSIS — R9431 Abnormal electrocardiogram [ECG] [EKG]: Secondary | ICD-10-CM

## 2016-05-19 DIAGNOSIS — R000 Tachycardia, unspecified: Secondary | ICD-10-CM

## 2016-05-19 DIAGNOSIS — J9601 Acute respiratory failure with hypoxia: Secondary | ICD-10-CM

## 2016-05-19 LAB — BASIC METABOLIC PANEL
ANION GAP: 8 (ref 5–15)
BUN: 8 mg/dL (ref 6–20)
CHLORIDE: 95 mmol/L — AB (ref 101–111)
CO2: 34 mmol/L — AB (ref 22–32)
Calcium: 8.1 mg/dL — ABNORMAL LOW (ref 8.9–10.3)
Creatinine, Ser: 0.69 mg/dL (ref 0.44–1.00)
GFR calc non Af Amer: >60 mL/min (ref >60)
Glucose, Bld: 138 mg/dL — ABNORMAL HIGH (ref 65–99)
POTASSIUM: 2.7 mmol/L — AB (ref 3.5–5.1)
Sodium: 137 mmol/L (ref 135–145)

## 2016-05-19 LAB — BLOOD GAS, ARTERIAL
ACID-BASE EXCESS: 9 mmol/L — AB (ref 0.0–2.0)
Bicarbonate: 32.6 mEq/L — ABNORMAL HIGH (ref 20.0–24.0)
Drawn by: 308601
O2 CONTENT: 3 L/min
O2 SAT: 93.9 %
PATIENT TEMPERATURE: 98.6
PO2 ART: 64.7 mmHg — AB (ref 80.0–100.0)
TCO2: 29.1 mmol/L (ref 0–100)
pCO2 arterial: 40.2 mmHg (ref 35.0–45.0)
pH, Arterial: 7.519 — ABNORMAL HIGH (ref 7.350–7.450)

## 2016-05-19 LAB — CBC
HEMATOCRIT: 32.4 % — AB (ref 36.0–46.0)
HEMOGLOBIN: 10.8 g/dL — AB (ref 12.0–15.0)
MCH: 31.4 pg (ref 26.0–34.0)
MCHC: 33.3 g/dL (ref 30.0–36.0)
MCV: 94.2 fL (ref 78.0–100.0)
Platelets: 188 10*3/uL (ref 150–400)
RBC: 3.44 MIL/uL — AB (ref 3.87–5.11)
RDW: 13.9 % (ref 11.5–15.5)
WBC: 9.9 10*3/uL (ref 4.0–10.5)

## 2016-05-19 LAB — MAGNESIUM: Magnesium: 1.8 mg/dL (ref 1.7–2.4)

## 2016-05-19 LAB — TROPONIN I: Troponin I: <0.03 ng/mL (ref <0.031)

## 2016-05-19 LAB — TSH: TSH: 0.452 u[IU]/mL (ref 0.350–4.500)

## 2016-05-19 MED ORDER — HYDROMORPHONE HCL 1 MG/ML IJ SOLN: 0.5000 mg | INTRAMUSCULAR | Status: DC | PRN

## 2016-05-19 MED ORDER — POTASSIUM CHLORIDE CRYS ER 20 MEQ PO TBCR
40.0000 meq | EXTENDED_RELEASE_TABLET | ORAL | Status: AC
  Administered 2016-05-19 (×2): 40 meq via ORAL
  Filled 2016-05-19 (×2): qty 2

## 2016-05-19 MED ORDER — POTASSIUM CHLORIDE 10 MEQ/100ML IV SOLN
10.0000 meq | INTRAVENOUS | Status: AC
  Administered 2016-05-19 (×2): 10 meq via INTRAVENOUS
  Filled 2016-05-19 (×2): qty 100

## 2016-05-19 MED ORDER — HYDROMORPHONE HCL 2 MG PO TABS
2.0000 mg | ORAL_TABLET | ORAL | Status: DC | PRN
  Administered 2016-05-19 – 2016-05-20 (×4): 2 mg via ORAL
  Filled 2016-05-19 (×6): qty 1

## 2016-05-19 MED ORDER — IOPAMIDOL (ISOVUE-370) INJECTION 76%
100.0000 mL | Freq: Once | INTRAVENOUS | Status: AC | PRN
  Administered 2016-05-19: 100 mL via INTRAVENOUS

## 2016-05-19 NOTE — Progress Notes (Signed)
Physical Therapy Treatment Note    05/19/16 1500  PT Visit Information  Last PT Received On 05/19/16  Assistance Needed +1  History of Present Illness Pt is a 75 year old female s/p L TKA  Subjective Data  Subjective Pt ambulated in hallway and SpO2 remained in 90s on room air during gait.  Pt also performed a few LE exercises.  Precautions  Precautions Knee  Required Braces or Orthoses Knee Immobilizer - Left  Restrictions  Other Position/Activity Restrictions WBAT  Pain Assessment  Pain Assessment 0-10  Pain Score 5  Pain Location L knee  Pain Descriptors / Indicators Aching;Sore  Pain Intervention(s) Limited activity within patient's tolerance;Monitored during session;Repositioned;Ice applied;Patient requesting pain meds-RN notified  Cognition  Arousal/Alertness Awake/alert  Behavior During Therapy WFL for tasks assessed/performed  Overall Cognitive Status Within Functional Limits for tasks assessed  Bed Mobility  Overal bed mobility Needs Assistance  Bed Mobility Supine to Sit  Supine to sit Min guard  General bed mobility comments verbal cues for technique, increased time for pt to self assist  Transfers  Overall transfer level Needs assistance  Equipment used Rolling walker (2 wheeled)  Transfers Sit to/from Stand  Sit to Stand Min assist  General transfer comment verbal cues for UE and LE positioning  Ambulation/Gait  Ambulation/Gait assistance Min guard  Ambulation Distance (Feet) 45 Feet  Assistive device Rolling walker (2 wheeled)  Gait Pattern/deviations Step-to pattern;Antalgic  General Gait Details verbal cues for RW positoning, step length, posture, SpO2 remained in 90s on room air, recliner following for safety  Exercises  Exercises Total Joint  Total Joint Exercises  Ankle Circles/Pumps AROM;Both;10 reps  Quad Sets AROM;Left;10 reps  [place];Left;10 reps  Heel Slides AAROM;Left;10 reps  Hip ABduction/ADduction AROM;Left;10 reps  Straight  Leg Raises AAROM;Left;10 reps  PT - End of Session  Equipment Utilized During Treatment Left knee immobilizer;Gait belt  Activity Tolerance Patient tolerated treatment well  Patient left in chair;with call bell/phone within reach;with chair alarm set  Nurse Communication Mobility status  PT - Assessment/Plan  PT Plan Current plan remains appropriate  PT Frequency (ACUTE ONLY) 7X/week  Follow Up Recommendations Home health PT  PT equipment Rolling walker with 5" wheels  PT Goal Progression  Progress towards PT goals Progressing toward goals  PT Time Calculation  PT Start Time (ACUTE ONLY) 1402  PT Stop Time (ACUTE ONLY) 1439  PT Time Calculation (min) (ACUTE ONLY) 37 min  PT General Charges  $$ ACUTE PT VISIT 1 Procedure  PT Treatments  $Gait Training 8-22 mins  $Therapeutic Exercise 8-22 mins   05/19/2016 Carmelia Bake, PT, DPT 05/19/2016 Pager: [phone]

## 2016-05-19 NOTE — Progress Notes (Signed)
Subjective: 2 Days Post-Op Procedure(s) (LRB): LEFT TOTAL KNEE ARTHROPLASTY (Left) Patient reports pain as moderate.   Patient seen in rounds with Dr. Wynelle Link. Patient is feeling better compared to yesterday. Reports that her breathing is better. No chest pain.    Objective: Vital signs in last 24 hours: Temp:  [98.3 F (36.8 C)-99.6 F (37.6 C)] 98.3 F (36.8 C) (06/14 0400) Pulse Rate:  [85-150] 91 (06/14 0600) Resp:  [14-19] 19 (06/14 0600) BP: (110-144)/(44-79) 125/70 mmHg (06/14 0600) SpO2:  [68 %-99 %] 95 % (06/14 0600)  Intake/Output from previous day:  Intake/Output Summary (Last 24 hours) at 05/19/16 0733 Last data filed at 05/18/16 2200  Gross per 24 hour  Intake 823.75 ml  Output   2350 ml  Net -1526.25 ml     Labs:  Recent Labs  05/18/16 0409 05/19/16 0317  HGB 10.8* 10.8*    Recent Labs  05/18/16 0409 05/19/16 0317  WBC 11.3* 9.9  RBC 3.46* 3.44*  HCT 31.2* 32.4*  PLT 210 188    Recent Labs  05/18/16 0409 05/19/16 0317  NA 140 137  K 3.7 2.7*  CL 101 95*  CO2 33* 34*  BUN 12 8  CREATININE 0.81 0.69  GLUCOSE 117* 138*  CALCIUM 8.4* 8.1*    EXAM General - Patient is Alert and Oriented Extremity - Neurologically intact Intact pulses distally Dorsiflexion/Plantar flexion intact No cellulitis present Compartment soft Dressing/Incision - mild bloody drainage from hemovac site. Incision clean and dry.  Motor Function - intact, moving foot and toes well on exam.   Past Medical History  Diagnosis Date  . Hypertension   . Hypothyroidism   . Chronic pain     lower back  . Bronchitis     history only  . Ulcer     hx  . Virus not detected 2010    postop, caught virus in hosp., respiratory symptoms, upon d/c to home, had allergic reaction /w plate size hives - not sure of the cause    . Sinus infection     recent sinus infection, finished Zpak- 08/16/2013  . Female bladder prolapse   . Anemia     took iron for a while  . SVD  (spontaneous vaginal delivery)     x 2  . Missed ab     x 6 - 2 surgery, 4 resolved on it's own  . Seasonal allergies   . Neuromuscular disorder ([place])     , left foot has some num[place]- uses cane  . PONV (postoperative nausea and vomiting)     states she had scop patch in past,states she sets off alarms when she is waking up, she "forgets to breathe"   . Allergy   . Blood transfusion without reported diagnosis   . Cataract   . Macular degeneration     wet- right eye  dry- left eye  . Hyperlipidemia   . Osteopenia   . Acquired left flat foot   . 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 Numbness     outer edge of heel left foot   . Tinnitus   . Imbalance   . Contusion of left hand   . Pneumonia   . Fracture of metatarsal bone     w nonunion   . Hallux rigidus   . Urinary tract infection   . Urinary incontinence   . Acquired leg length discrepancy   . Metatarsalgia of left foot   . Trigger middle finger  of left hand   . Allergic urticaria   . Shingles   . History of measles   . History of mumps   . History of rubella   . Menopause   . Chronic fatigue syndrome   . Arthritis     lower back, knees    Assessment/Plan: 2 Days Post-Op Procedure(s) (LRB): LEFT TOTAL KNEE ARTHROPLASTY (Left) Principal Problem:   OA (osteoarthritis) of knee  Estimated body mass index is 32.66 kg/(m^2) as calculated from the following:   Height as of this encounter: 5\' 3"  (1.6 m).   Weight as of this encounter: 83.604 kg (184 lb 5 oz). Advance diet Up with therapy D/C IV fluids when tolerating POs well  DVT Prophylaxis - Xarelto Weight-Bearing as tolerated   She is doing better this morning. Hopeful to taper off O2. If she improves and is cleared by medicine, transfer back to [place]. No more IV dilaudid and try not hold PO dilaudid as well. Hopeful to DC home tomorrow.   Ardeen Jourdain, PA-C Orthopaedic Surgery 05/19/2016, 7:33 AM

## 2016-05-19 NOTE — Progress Notes (Signed)
Report given on pt. Transferred and put on tele. V/S taken. I agree with the assessment put in by the previous nurse for today. Will continue to monitor pt. Closely.

## 2016-05-19 NOTE — Consult Note (Signed)
[facility] Medical Consultation  [name] [name] [id number] DOB: July 09, 1941 DOA: 05/17/2016 PCP: [name] Lopes, MD   Requesting physician: Dr. Wynelle [name] Date of consultation: May 18, 2016 Reason for consultation: Post-operative tachycardia, hypoxia, new O2 requirement, EKG changes  Impression/Recommendations Principal Problem:   OA (osteoarthritis) of knee    1. New onset tachycardia (now resolved), hypoxia (with O2 requirement) in the post-operative setting --Must rule out PE even though she has been on xarelto for DVT prophylaxis; CTA chest ASAP --Post-operative pneumonia would also be a consideration; hold on IV antibiotics and blood cultures unless we confirm infiltrate on chest CT.  Chest xray reviewed by me; there is a questionable left lower lobe infiltrate (vs atelectasis) --Caution with IV and oral dilaudid (suppression of respiratory drive); patient already on methadone TID at baseline. --Incentive spirometer use q1h while awake --Check ABG to rule out acute CO2 retention  2.  EKG changes without chest pain (T wave inversions in II, V5, V6; ST depressions in V3, V4) --Trend troponin --Repeat EKG shows resolution of changes noted above, NSR, no longer tachycardic.  Continue to monitor.  Low threshold for cardiology consult in the AM; particularly if CTA chest is unrevealing and/or troponin becomes positive.  Hospitalist will followup again tomorrow.  Thank you for this consultation.  Chief Complaint: Post-operative tachycardia, hypoxia, new O2 requirement, EKG changes  HPI: Mrs. [name] [name] is a 75yo woman with a history of HTN, HLD, and OA who is admitted to the orthopedic service.  She is POD #1 S/P left total knee replacement.  She was up with physical therapy today and subsequently developed palpitations, light-headedness, and diaphoresis.  She denies chest pain or shortness of breath.  No cough.  No nausea or vomiting.  Rapid Response was called for hypoxia with  oxygen desaturations into the 80's and new sinus tachycardia with T wave inversions in leads II, V5, and V6 as well as ST depressions in V3 and V4.  Hypoxia improved with supplemental oxygen.  Tachycardia had been persistent but the patient is now relatively asymptomatic.  She has increased lethargy that is attributed to her narcotic pain medications, but she answers orientation questions appropriately.  Hospitalist asked to evaluate.   Review of Systems: Limited ROS negative except as stated in the HPI.  Past Medical History  Diagnosis Date  . Hypertension   . Hypothyroidism   . Chronic pain     lower back  . Bronchitis     history only  . Ulcer     hx  . Virus not detected 2010    postop, caught virus in hosp., respiratory symptoms, upon d/c to home, had allergic reaction /w plate size hives - not sure of the cause    . Sinus infection     recent sinus infection, finished Zpak- 08/16/2013  . Female bladder prolapse   . Anemia     took iron for a while  . SVD (spontaneous vaginal delivery)     x 2  . Missed ab     x 6 - 2 surgery, 4 resolved on it's own  . Seasonal allergies   . Neuromuscular disorder ([place])     , left foot has some numbeness- uses cane  . PONV (postoperative nausea and vomiting)     states she had scop patch in past,states she sets off alarms when she is waking up, she "forgets to breathe"   . Allergy   . Blood transfusion without reported diagnosis   . Cataract   .  Macular degeneration     wet- right eye  dry- left eye  . Hyperlipidemia   . Osteopenia   . Acquired left flat foot   . 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 Numbness     outer edge of heel left foot   . Tinnitus   . Imbalance   . Contusion of left hand   . Pneumonia   . Fracture of metatarsal bone     w nonunion   . Hallux rigidus   . Urinary tract infection   . Urinary incontinence   . Acquired leg length discrepancy   . Metatarsalgia of left foot   . Trigger middle  finger of left hand   . Allergic urticaria   . Shingles   . History of measles   . History of mumps   . History of rubella   . Menopause   . Chronic fatigue syndrome   . Arthritis     lower back, knees   Past Surgical History  Procedure Laterality Date  . Hammer toe surgery  4/12    lt foot-2-3  . Appendectomy    . Metatarsal osteotomy  3/12    lt  . Thumb fusion  2008    rt  . Carpal tunnel release      both  . Knee arthroscopy  2007    both  . Orif toe fracture  11/04/2011    Procedure: OPEN REDUCTION INTERNAL FIXATION (ORIF) METATARSAL (TOE) FRACTURE;  Surgeon: Wylene Simmer, MD;  Location: [place];  Service: Orthopedics;  Laterality: Left;  left revision orif 1st metatarsal with autograft from left calcaneous and left 2nd-3rd metatarsal weil osteotomy  . 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 Dilation and curettage of uterus      x2  . Total knee arthroplasty  08/29/2012    Procedure: TOTAL KNEE ARTHROPLASTY;  Surgeon: Hessie Dibble, MD;  Location: [place];  Service: Orthopedics;  Laterality: Right;  . Other surgical history  05/08/13    hardware removed from left foot  . Bunionectomy  03/02/11 and 11/04/11    left foot x2  . Joint replacement      R- TKA- 2013  . Tubal ligation    . Total shoulder arthroplasty Right 05/09/2014    Procedure: TOTAL SHOULDER ARTHROPLASTY;  Surgeon: Nita Sells, MD;  Location: [place];  Service: Orthopedics;  Laterality: Right;  Right shoulder arthroplasty  . Lumbar laminectomy  05/1999    L4-L5  . Back surgery  09,00,10,99, 6/14,9/14    x6 surgeries on back -Spinal Fusin L4-L5, Lumbar Fusion L1-S1  . Breast surgery      left cyst- benign  . Abdominal hysterectomy Bilateral 10/07/2014    Procedure: TOTAL ABDOMINAL HYSTERECTOMY WITH BILATERAL SALPINGO OOPHORECTOMY AND HALBANS CULDOELASTY ;  Surgeon: Lyman Speller, MD;  Location: [facility];  Service: Gynecology;  Laterality: Bilateral;  Miller primary/Silva assist  4 hours  total  . Anterior and posterior repair N/A 10/07/2014    Procedure: ANTERIOR (CYSTOCELE) AND POSTERIOR REPAIR (RECTOCELE);  Surgeon: Lyman Speller, MD;  Location: [facility];  Service: Gynecology;  Laterality: N/A;  . Bladder suspension N/A 10/07/2014    Procedure: TRANSVAGINAL TAPE (TVT) EXACT PROCEDURE;  Surgeon: Lyman Speller, MD;  Location: [facility];  Service: Gynecology;  Laterality: N/A;  . Cystoscopy N/A 10/07/2014    Procedure: CYSTOSCOPY;  Surgeon: Lyman Speller, MD;  Location: [facility];  Service: Gynecology;  Laterality: N/A;  . Removal of two osteomas      on forehead-2008  . Total knee revision Right 11/04/2015    Procedure: TOTAL KNEE ARTHROPLASTY REVISION;  Surgeon: Gaynelle Arabian, MD;  Location: [facility];  Service: Orthopedics;  Laterality: Right;  . Steriod injection Left 11/04/2015    Procedure: STEROID INJECTION;  Surgeon: Gaynelle Arabian, MD;  Location: [facility];  Service: Orthopedics;  Laterality: Left;  . I&d and spina abscess l4-5    . Total knee arthroplasty Left 05/17/2016    Procedure: LEFT TOTAL KNEE ARTHROPLASTY;  Surgeon: Gaynelle Arabian, MD;  Location: [facility];  Service: Orthopedics;  Laterality: Left;   Social History:  reports that she has never smoked. She has never used smokeless tobacco. She reports that she drinks about 0.6 oz of alcohol per week. She reports that she does not use illicit drugs.  Allergies  Allergen Reactions  . Oxycodone Hives    10mg  pink pill, allergic only to pink pills, if not pink she can take  . Flexeril [Cyclobenzaprine] Hives  . Red Dye Hives and Swelling  . Adhesive [Tape] Other (See Comments)    Blisters - Tolerates paper tape  . Ciprofloxacin Hives  . Crab [Shellfish Allergy] Swelling and Other (See Comments)    Lip swellig with artificial crab meat  . Pravastatin Hives and Other (See Comments)    Tolerates atorvastatin  . Tylenol [Acetaminophen] Hives and Other (See Comments)    Makes her feel funny. Cannot take Tylenol in  combination products (Percocet, Vicodin), but can take it alone Tolerates opiates individually except with red dye  . Latex Hives and Other (See Comments)    Blisters  . Lisinopril Hives  . Methocarbamol Hives  . Morphine And Related Other (See Comments)    Does not provide any pain relief  . Sulfa Antibiotics Hives   Family History  Problem Relation Age of Onset  . Hyperlipidemia Father   . Hypertension Father   . Heart attack Father 18    deceased  . Colon cancer Neg Hx   . Esophageal cancer Neg Hx   . Stomach cancer Neg Hx   . Rectal cancer Neg Hx     Prior to Admission medications   Medication Sig Start Date End Date Taking? Authorizing Provider  Ascorbic Acid (VITAMIN C) 1000 MG tablet Take 1,000 mg by mouth daily.   Yes Historical Provider, MD  aspirin EC 81 MG tablet Take 81 mg by mouth daily.   Yes Historical Provider, MD  atorvastatin (LIPITOR) 20 MG tablet Take 20 mg by mouth at bedtime.    Yes Historical Provider, MD  buPROPion (WELLBUTRIN SR) 100 MG 12 hr tablet Take 100 mg by mouth daily.  09/09/15  Yes Historical Provider, MD  Calcium-Magnesium-Vitamin D (CALCIUM 1200+D3 PO) Take 1 tablet by mouth 2 (two) times daily.   Yes Historical Provider, MD  Carboxymethylcellulose Sodium (REFRESH TEARS OP) Place 2 drops into both eyes daily as needed (For dry eyes.).   Yes Historical Provider, MD  carvedilol (COREG) 6.25 MG tablet Take 6.25 mg by mouth 2 (two) times daily with a meal.  09/25/14  Yes Historical Provider, MD  Cyanocobalamin (VITAMIN B-12) 5000 MCG TBDP Take 5,000 mcg by mouth daily.   Yes Historical Provider, MD  diazepam (VALIUM) 5 MG tablet Take 5 mg by mouth every 6 (six) hours as needed (For back spasms.).    Yes Historical Provider, MD  etodolac (LODINE) 400 MG tablet Take 400 mg by mouth  2 (two) times daily.   Yes Historical Provider, MD  fluorometholone (FML) 0.1 % ophthalmic suspension Place 1 drop into both eyes 2 (two) times daily.   Yes Historical  Provider, MD  fluticasone (FLONASE) 50 MCG/ACT nasal spray Place 2 sprays into both nostrils daily as needed for allergies.   Yes Historical Provider, MD  furosemide (LASIX) 40 MG tablet Take 40 mg by mouth daily before supper.    Yes Historical Provider, MD  gabapentin (NEURONTIN) 600 MG tablet Take 600 mg by mouth 3 (three) times daily.    Yes Historical Provider, MD  levothyroxine (SYNTHROID, LEVOTHROID) 75 MCG tablet Take 75 mcg by mouth daily.    Yes Historical Provider, MD  loratadine (KLS ALLERCLEAR) 10 MG tablet Take 10 mg by mouth daily as needed for allergies.   Yes Historical Provider, MD  magnesium oxide (MAG-OX) 400 MG tablet Take 400 mg by mouth daily.   Yes Historical Provider, MD  methadone (DOLOPHINE) 5 MG tablet Take 5 mg by mouth every 8 (eight) hours.    Yes Historical Provider, MD  Multiple Vitamin (MULTIVITAMIN WITH MINERALS) TABS tablet Take 1 tablet by mouth daily.   Yes Historical Provider, MD  Multiple Vitamins-Minerals (PRESERVISION AREDS PO) Take 1 tablet by mouth 2 (two) times daily.   Yes Historical Provider, MD  OVER THE COUNTER MEDICATION Take 2,000 mg by mouth daily. Flaxseed Oil 2000mg    Yes Historical Provider, MD  OVER THE COUNTER MEDICATION Take 1,000 mcg by mouth 2 (two) times daily. Biotin 1061mcg   Yes Historical Provider, MD  oxyCODONE (OXY IR/ROXICODONE) 5 MG immediate release tablet Take 1-3 tablets (5-15 mg total) by mouth every 4 (four) hours as needed for moderate pain or severe pain. 11/06/15  Yes Arlee Muslim, PA-C  senna (SENOKOT) 8.6 MG tablet Take 2-3 tablets by mouth 2 (two) times daily. Take two tablets in the morning and three tablets in the evening.   Yes Historical Provider, MD  EPIPEN 2-PAK 0.3 MG/0.3ML SOAJ injection Inject 0.3 mg into the muscle once as needed (For anaphylaxis.).  11/21/14   Historical Provider, MD  HYDROmorphone (DILAUDID) 2 MG tablet Take 1-2 tablets (2-4 mg total) by mouth every 4 (four) hours as needed for severe pain.  05/18/16   Amber Cecilio Asper, PA-C  rivaroxaban (XARELTO) 10 MG TABS tablet Take 1 tablet (10 mg total) by mouth daily with breakfast. 05/18/16   Ardeen Jourdain, PA-C  traMADol (ULTRAM) 50 MG tablet Take 1-2 tablets (50-100 mg total) by mouth every 6 (six) hours as needed (mild pain). 11/06/15   Arlee Muslim, PA-C   Physical Exam: Blood pressure 110/73, pulse 86, temperature 98.6 F (37 C), temperature source Oral, resp. rate 19, height 5\' 3"  (1.6 m), weight 83.604 kg (184 lb 5 oz), last menstrual period 12/06/2000, SpO2 83 %. Filed Vitals:   05/18/16 2240 05/19/16 0000  BP: 126/79 110/73  Pulse: 142 86  Temp:    Resp: 18 19     General:  Lethargic but arousable.  NAD.  Oriented x 4.  Eyes: PERRL bilaterally, EOMI intact  ENT: mucous membranes are slightly dry, normal dentition  Neck: supple  Cardiovascular: NR/RR, no significant pitting edema  Respiratory: CTA bilaterally, no wheezing  Abdomen: soft, nontender, nondistended, bowel sounds are hypoactive  Skin: warm and dry  Musculoskeletal: Left leg is in an immobilizer, moves all other extremities without limitation  Psychiatric: Lethargic but appropriate insight  Neurologic: No focal deficits  Labs on Admission:  Basic Metabolic Panel:  Recent Labs Lab 05/18/16 0409  NA 140  K 3.7  CL 101  CO2 33*  GLUCOSE 117*  BUN 12  CREATININE 0.81  CALCIUM 8.4*   CBC:  Recent Labs Lab 05/18/16 0409  WBC 11.3*  HGB 10.8*  HCT 31.2*  MCV 90.2  PLT 210   Cardiac Enzymes:  Recent Labs Lab 05/18/16 2112  TROPONINI <0.03   Radiological Exams on Admission: Dg Chest Port 1 View  05/18/2016  CLINICAL DATA:  Acute onset of decreased O2 saturation. Initial encounter. EXAM: PORTABLE CHEST 1 VIEW COMPARISON:  Chest radiograph performed 11/12/2015 FINDINGS: Mild left basilar airspace opacity could reflect atelectasis or mild pneumonia. No definite pleural effusion or pneumothorax is seen. The cardiomediastinal silhouette  is normal in size. No acute osseous abnormalities are seen. A right shoulder arthroplasty is grossly unremarkable in appearance, though incompletely imaged. IMPRESSION: Mild left basilar airspace opacity could reflect atelectasis or mild pneumonia. Electronically Signed   By: Garald Balding M.D.   On: 05/18/2016 18:41    EKG: Independently reviewed. Multiple EKGs reviewed by me.  Pertinent findings as outlined above.  Time spent: 70 minutes   [facility]  If 7PM-7AM, please contact night-coverage [email or web address] Password [email or web address] 05/19/2016, 1:40 AM

## 2016-05-19 NOTE — Progress Notes (Signed)
Physical Therapy Treatment Patient Details Name: [name] [name] MRN: [id number] DOB: [birth year] Today's Date: 18-Jun-2016    History of Present Illness Pt is a 75 year old female s/p L TKA    PT Comments    Pt assisted OOB to recliner and only able to tolerate 5 feet of ambulation due to dizziness and nausea.    Follow Up Recommendations  Home health PT     Equipment Recommendations  Rolling walker with 5" wheels    Recommendations for Other Services       Precautions / Restrictions Precautions Precautions: Knee Required Braces or Orthoses: Knee Immobilizer - Left Restrictions Other Position/Activity Restrictions: WBAT    Mobility  Bed Mobility Overal bed mobility: Needs Assistance Bed Mobility: Supine to Sit     Supine to sit: Mod assist     General bed mobility comments: verbal cues for technique, pt self assist L LE however required assist for trunk from flat HOB  Transfers Overall transfer level: Needs assistance Equipment used: Rolling walker (2 wheeled) Transfers: Sit to/from Stand Sit to Stand: Min assist         General transfer comment: verbal cues for UE and LE positioning  Ambulation/Gait Ambulation/Gait assistance: Min assist Ambulation Distance (Feet): 5 Feet Assistive device: Rolling walker (2 wheeled)       General Gait Details: verbal cues for sequence, RW positioning, pt reporting dizziness and nausea limiting distance, remained on 2L O2 [place] (1.5L at rest) and SpO2 92-97% with mobility, HR 95 bpm upon sitting in recliner   Stairs            Wheelchair Mobility    Modified Rankin (Stroke Patients Only)       Balance                                    Cognition Arousal/Alertness: Awake/alert Behavior During Therapy: WFL for tasks assessed/performed Overall Cognitive Status: Within Functional Limits for tasks assessed                      Exercises      General Comments        Pertinent  Vitals/Pain Pain Assessment: 0-10 Pain Score: 6  Pain Location: L knee Pain Descriptors / Indicators: Sore;Aching Pain Intervention(s): Limited activity within patient's tolerance;Monitored during session;Repositioned    Home Living                      Prior Function            PT Goals (current goals can now be found in the care plan section) Progress towards PT goals: Progressing toward goals    Frequency  7X/week    PT Plan Current plan remains appropriate    Co-evaluation             End of Session Equipment Utilized During Treatment: Left knee immobilizer;Gait belt Activity Tolerance: Patient limited by fatigue Patient left: in chair;with call bell/phone within reach;with family/visitor present  NT notified of pt with soiled bed linen and pt up in recliner.     Time: [id number] PT Time Calculation (min) (ACUTE ONLY): 24 min  Charges:  $Gait Training: 8-22 mins                    G Codes:      Moniqua Engebretsen,KATHrine E 06-18-2016, 12:23 PM Carmelia Bake, PT, DPT  05/19/2016 Pager: [phone]

## 2016-05-19 NOTE — Progress Notes (Signed)
Pt transferred to ICU/SD and bedside report given to Emeterio Reeve, RN. Family at bedside and updated on plan of care for pt.

## 2016-05-19 NOTE — Progress Notes (Signed)
Subjective: Patient was seen by [facility] hospital[facility] this morning, see detailed note by Dr. Lily Kocher. 75yo woman with a history of HTN, HLD, and OA who is admitted to the orthopedic service. She is POD #1 S/P left total knee replacement. She was up with physical therapy today and subsequently developed palpitations, light-headedness, and diaphoresis. She denies chest pain or shortness of breath. No cough. No nausea or vomiting. Rapid Response was called for hypoxia with oxygen desaturations into the 80's and new sinus tachycardia with T wave inversions in leads II, V5, and V6 as well as ST depressions in V3 and V4. Hypoxia improved with supplemental oxygen. Tachycardia had been persistent but the patient is now relatively asymptomatic. She has increased lethargy that is attributed to her narcotic pain medications, but she answers orientation questions appropriately. Hospitalist asked to evaluate.  Patient denies any complaints. Denies chest pain. Tachycardia has resolved. Filed Vitals:   05/19/16 1200 05/19/16 1251  BP:  124/68  Pulse: 93 89  Temp:  98.9 F (37.2 C)  Resp: 17 20    Chest: Clear Bilaterally Heart : S1S2 RRR Abdomen: Soft, nontender Ext : No edema Neuro: Alert, oriented x 3  A/P Sinus tachycardia Hypokalemia EKG changes  Cardiac enzymes are negative so far, will replace potassium Continue to monitor on telemetry     [place] Pager- [phone]

## 2016-05-20 DIAGNOSIS — R000 Tachycardia, unspecified: Secondary | ICD-10-CM

## 2016-05-20 LAB — CBC
HCT: 27.7 % — ABNORMAL LOW (ref 36.0–46.0)
HEMOGLOBIN: 9.4 g/dL — AB (ref 12.0–15.0)
MCH: 31.1 pg (ref 26.0–34.0)
MCHC: 33.9 g/dL (ref 30.0–36.0)
MCV: 91.7 fL (ref 78.0–100.0)
Platelets: 189 10*3/uL (ref 150–400)
RBC: 3.02 MIL/uL — AB (ref 3.87–5.11)
RDW: 13.9 % (ref 11.5–15.5)
WBC: 9.8 10*3/uL (ref 4.0–10.5)

## 2016-05-20 LAB — BASIC METABOLIC PANEL
ANION GAP: 8 (ref 5–15)
BUN: 10 mg/dL (ref 6–20)
CHLORIDE: 96 mmol/L — AB (ref 101–111)
CO2: 31 mmol/L (ref 22–32)
CREATININE: 0.73 mg/dL (ref 0.44–1.00)
Calcium: 8.3 mg/dL — ABNORMAL LOW (ref 8.9–10.3)
GFR calc non Af Amer: >60 mL/min (ref >60)
Glucose, Bld: 121 mg/dL — ABNORMAL HIGH (ref 65–99)
POTASSIUM: 3.4 mmol/L — AB (ref 3.5–5.1)
SODIUM: 135 mmol/L (ref 135–145)

## 2016-05-20 MED ORDER — LEVOTHYROXINE SODIUM 50 MCG PO TABS: 50.0000 ug | ORAL_TABLET | Freq: Every day | ORAL | Status: DC

## 2016-05-20 MED ORDER — POTASSIUM CHLORIDE CRYS ER 10 MEQ PO TBCR: 10.0000 meq | EXTENDED_RELEASE_TABLET | Freq: Every day | ORAL | Status: DC

## 2016-05-20 MED ORDER — POTASSIUM CHLORIDE CRYS ER 20 MEQ PO TBCR
40.0000 meq | EXTENDED_RELEASE_TABLET | Freq: Two times a day (BID) | ORAL | Status: DC
  Administered 2016-05-20: 40 meq via ORAL
  Filled 2016-05-20: qty 2

## 2016-05-20 NOTE — Progress Notes (Signed)
[facility]  PROGRESS NOTE  [name] [name] [id number] DOB: 08-20-1941 DOA: 05/17/2016 PCP: [name], MD    Brief HPI:   Principal Problem:   OA (osteoarthritis) of knee Active Problems:   Hypokalemia   Sinus tachycardia (HCC)   Assessment/Plan: 1. Sinus tachycardia- likely from uncontrolled pain and Synthroid, as TSH came back 0.452. We will cut down the dose of Synthroid 50 g by mouth daily. Advised patient not to take Synthroid for next 3 days, and start taking from Monday, 05/24/2016. I have made the changes in the discharge med reconciliation and electronically sent the new prescription to the pharmacy. 2. Hypokalemia- potassium is 3.4 today, which is being replaced. We will also add Kdur 10 mEq daily, as outpatient. I will update the discharge med reconciliation.        Okay for  discharge from medical standpoint.      Subjective: Patient seen and examined, still become tachycardic on ambulation. Patient takes Synthroid 75 g daily, TSH level in the hospital was 0.452.   Objective: Filed Vitals:   05/19/16 1251 05/19/16 2158 05/20/16 0441 05/20/16 0830  BP: 124/68 121/58 132/53 147/71  Pulse: 89 86 89 115  Temp: 98.9 F (37.2 C) 99.2 F (37.3 C) 99.8 F (37.7 C) 99.1 F (37.3 C)  TempSrc: Oral Oral Oral Oral  Resp: 20 20 18    Height:      Weight:      SpO2: 94% 96% 91% 93%    Intake/Output Summary (Last 24 hours) at 05/20/16 0959 Last data filed at 05/20/16 0900  Gross per 24 hour  Intake    240 ml  Output      0 ml  Net    240 ml   Filed Weights   05/17/16 0937  Weight: 83.604 kg (184 lb 5 oz)    Examination:  General exam: Appears calm and comfortable  Respiratory system: Clear to auscultation. Respiratory effort normal. Cardiovascular system: S1 & S2 heard, RRR. No JVD, murmurs, rubs, gallops or clicks. No pedal edema. Gastrointestinal system: Abdomen is nondistended, soft and nontender. No organomegaly or masses felt. Normal bowel  sounds heard. Central nervous system: Alert and oriented. No focal neurological deficits. Extremities: Symmetric 5 x 5 power. Skin: No rashes, lesions or ulcers Psychiatry: Judgement and insight appear normal. Mood & affect appropriate.    Data Reviewed: I have personally reviewed following labs and imaging studies Basic Metabolic Panel:  Recent Labs Lab 05/18/16 0409 05/19/16 0317 05/20/16 0445  NA 140 137 135  K 3.7 2.7* 3.4*  CL 101 95* 96*  CO2 33* 34* 31  GLUCOSE 117* 138* 121*  BUN 12 8 10   CREATININE 0.81 0.69 0.73  CALCIUM 8.4* 8.1* 8.3*  MG  --  1.8  --    CBC:  Recent Labs Lab 05/18/16 0409 05/19/16 0317 05/20/16 0445  WBC 11.3* 9.9 9.8  HGB 10.8* 10.8* 9.4*  HCT 31.2* 32.4* 27.7*  MCV 90.2 94.2 91.7  PLT 210 188 189   Cardiac Enzymes:  Recent Labs Lab 05/18/16 2112 05/19/16 0317 05/19/16 0943 05/19/16 1540  TROPONINI <0.03 <0.03 <0.03 <0.03     Studies: Ct Angio Chest Pe W/cm &/or Wo Cm  05/19/2016  CLINICAL DATA:  Tachycardia and decreased oxygen saturations after knee surgery on 05/17/2016. EXAM: CT ANGIOGRAPHY CHEST WITH CONTRAST TECHNIQUE: Multidetector CT imaging of the chest was performed using the standard protocol during bolus administration of intravenous contrast. Multiplanar CT image reconstructions and MIPs were obtained to evaluate  the vascular anatomy. CONTRAST:  100 mL Isovue 370 COMPARISON:  None. FINDINGS: Technically adequate study with good opacification of the central and segmental pulmonary arteries. No focal filling defects are demonstrated. No evidence of significant pulmonary embolus. Normal heart size. Mild coronary artery calcification. Normal caliber thoracic aorta. No aneurysm or dissection. Great vessel origins are patent. Esophagus is decompressed. No significant lymphadenopathy in the chest. Dependent atelectasis in the lung bases. No focal consolidation. Airways are patent. No pleural effusions. No pneumothorax. Included  portions of the upper abdominal organs are grossly unremarkable. Degenerative changes in the spine. No destructive bone lesions. Review of the MIP images confirms the above findings. IMPRESSION: No evidence of significant pulmonary embolus. Atelectasis in the lung bases. Electronically Signed   By: Lucienne Capers M.D.   On: 05/19/2016 05:51   Dg Chest Port 1 View  05/19/2016  CLINICAL DATA:  Atelectasis. EXAM: PORTABLE CHEST 1 VIEW COMPARISON:  05/18/2016. FINDINGS: Mediastinum hilar structures normal. Low lung volumes with mild bibasilar atelectasis and/or infiltrates. Small left pleural effusion. No pneumothorax. Right shoulder replacement. Lumbar spine fusion. IMPRESSION: Bibasilar subsegmental atelectasis and/or infiltrates. Small left pleural effusion. No significant interim change from prior exam. Electronically Signed   By: [place]   On: 05/19/2016 06:48   Dg Chest Port 1 View  05/18/2016  CLINICAL DATA:  Acute onset of decreased O2 saturation. Initial encounter. EXAM: PORTABLE CHEST 1 VIEW COMPARISON:  Chest radiograph performed 11/12/2015 FINDINGS: Mild left basilar airspace opacity could reflect atelectasis or mild pneumonia. No definite pleural effusion or pneumothorax is seen. The cardiomediastinal silhouette is normal in size. No acute osseous abnormalities are seen. A right shoulder arthroplasty is grossly unremarkable in appearance, though incompletely imaged. IMPRESSION: Mild left basilar airspace opacity could reflect atelectasis or mild pneumonia. Electronically Signed   By: Garald Balding M.D.   On: 05/18/2016 18:41    Scheduled Meds: . atorvastatin  20 mg Oral QHS  . buPROPion  100 mg Oral Daily  . carvedilol  6.25 mg Oral BID WC  . docusate sodium  100 mg Oral BID  . furosemide  40 mg Oral QAC supper  . gabapentin  600 mg Oral TID  . methadone  5 mg Oral Q8H  . potassium chloride  40 mEq Oral BID  . rivaroxaban  10 mg Oral Q breakfast   Continuous Infusions: .  sodium chloride Stopped (05/18/16 1051)       Time spent: 25 min    Between Hospitalists Pager [phone]. If 7PM-7AM, please contact night-coverage at [email or web address], Office  [phone]  password [email or web address] 05/20/2016, 9:59 AM  LOS: 3 days

## 2016-05-20 NOTE — Care Management Note (Signed)
Case Management Note  Patient Details  Name: [name] [name] MRN: [id number] Date of Birth: [birth year]  Subjective/Objective:                    Action/Plan:d/c home w/[facility]HC-[facility]/dme rw-[facility]   Expected Discharge Date:                  Expected Discharge Plan:  [name]  In-House Referral:     Discharge planning Services  CM Consult  Post Acute Care Choice:    Choice offered to:  Patient  DME Arranged:  [facility] DME Agency:  [place] Arranged:  PT [facility] Agency:  [place]  Status of Service:  Completed, signed off  Medicare Important Message Given:    Date Medicare IM Given:    Medicare IM give by:    Date Additional Medicare IM Given:    Additional Medicare Important Message give by:     If discussed at [place] of Stay Meetings, dates discussed:    Additional Comments:  Dessa Phi, RN 05/20/2016, 9:52 AM

## 2016-05-20 NOTE — Progress Notes (Signed)
Physical Therapy Treatment Patient Details Name: [name] [name] MRN: [id number] DOB: 1941/07/14 Today's Date: 05/20/2016    History of Present Illness Pt is a 74 year old female s/p L TKA    PT Comments    POD # 3 am session Resting HR 120.  Assisted OOB to amb to bathroom HR increased to 148.  Max c/o fatigue.  That's when Medical Physician arrived.  Assisted to EOB as he updated pt and daughter on current condition.  Practiced only 2 steps with daughter then assisted back to bed.    Follow Up Recommendations  Home health PT     Equipment Recommendations  Rolling walker with 5" wheels    Recommendations for Other Services       Precautions / Restrictions Precautions Precautions: Knee Precaution Comments: instructed on KI use for amb and def for stairs.  Instructed pt and daughter Required Braces or Orthoses: Knee Immobilizer - Left Restrictions Weight Bearing Restrictions: No Other Position/Activity Restrictions: WBAT    Mobility  Bed Mobility Overal bed mobility: Needs Assistance Bed Mobility: Supine to Sit     Supine to sit: Min guard     General bed mobility comments: had dght hands on assist pt OOB with instruction on safe handling  Transfers Overall transfer level: Needs assistance Equipment used: Rolling walker (2 wheeled) Transfers: Sit to/from Stand Sit to Stand: Min guard         General transfer comment: 25% VC's on proper tech and hand placement, had dghtr hands on assist pt with transfer toileting.    Ambulation/Gait Ambulation/Gait assistance: Supervision;Min guard Ambulation Distance (Feet): 16 Feet (8 feet x 2 to and from bathroom only due to high HR) Assistive device: Rolling walker (2 wheeled) Gait Pattern/deviations: Step-to pattern;Decreased step length - left     General Gait Details: verbal cues for RW positoning, step length.  Had daght hands on assist pt with instruction from therpist.    Stairs Stairs: Yes Stairs assistance: Min  assist Stair Management: One rail Right;Step to pattern;Forwards;With crutches Number of Stairs: 2 General stair comments: with dght hands on instruction on proper tech and safe handling.  Pt used one R rail and one L crutch.  Limited stairs due to high HR.    Wheelchair Mobility    Modified Rankin (Stroke Patients Only)       Balance                                    Cognition Arousal/Alertness: Awake/alert Behavior During Therapy: WFL for tasks assessed/performed Overall Cognitive Status: Within Functional Limits for tasks assessed                      Exercises      General Comments        Pertinent Vitals/Pain Pain Assessment: 0-10 Pain Score: 5  Pain Location: L knee Pain Descriptors / Indicators: Discomfort;Sore;Tender Pain Intervention(s): Monitored during session;Premedicated before session;Repositioned;Ice applied    Home Living                      Prior Function            PT Goals (current goals can now be found in the care plan section) Progress towards PT goals: Progressing toward goals    Frequency  7X/week    PT Plan Current plan remains appropriate    Co-evaluation  End of Session Equipment Utilized During Treatment: Left knee immobilizer;Gait belt Activity Tolerance: Treatment limited secondary to medical complications (Comment) (tachycardia) Patient left: in bed;with bed alarm set;with family/visitor present     Time: [id number] PT Time Calculation (min) (ACUTE ONLY): 40 min  Charges:  $Gait Training: 23-37 mins $Therapeutic Activity: 8-22 mins                    G Codes:      Rica Koyanagi  PTA [facility]  Acute  Rehab Pager      [phone]

## 2016-05-20 NOTE — Progress Notes (Signed)
Completed D/C teaching with patient and family. Gave prescription to pt. Answered all questions. Pt will be D/C home with family in stable condition

## 2016-05-20 NOTE — Progress Notes (Signed)
Physical Therapy Treatment Patient Details Name: [name] [name] MRN: [id number] DOB: 06/11/1941 Today's Date: 05/20/2016    History of Present Illness Pt is a 75 year old female s/p L TKA    PT Comments    POD # 2 pm session Returned to perform and educate on TKR TE's.  Performed al supine TE's following HEP handout.  Instructed on proper tech and use of ICE.  Resting HR was 92 and with TE's avg 97.   Follow Up Recommendations  Home health PT     Equipment Recommendations  Rolling walker with 5" wheels    Recommendations for Other Services       Precautions / Restrictions Precautions Precautions: Knee Precaution Comments: instructed on KI use for amb and def for stairs.  Instructed pt and daughter Required Braces or Orthoses: Knee Immobilizer - Left Restrictions Weight Bearing Restrictions: No Other Position/Activity Restrictions: WBAT       Balance                                    Cognition Arousal/Alertness: Awake/alert Behavior During Therapy: WFL for tasks assessed/performed Overall Cognitive Status: Within Functional Limits for tasks assessed                      Exercises   Total Knee Replacement TE's 10 reps B LE ankle pumps 10 reps towel squeezes 10 reps knee presses 10 reps heel slides  10 reps SAQ's 10 reps SLR's 10 reps ABD Followed by ICE     General Comments        Pertinent Vitals/Pain Pain Assessment: 0-10 Pain Score: 5  Pain Location: L knee Pain Descriptors / Indicators: Discomfort;Sore;Tender Pain Intervention(s): Monitored during session;Premedicated before session;Repositioned;Ice applied    Home Living                      Prior Function            PT Goals (current goals can now be found in the care plan section) Progress towards PT goals: Progressing toward goals    Frequency  7X/week    PT Plan Current plan remains appropriate    Co-evaluation             End of Session  Equipment Utilized During Treatment: Left knee immobilizer;Gait belt Activity Tolerance: Treatment limited secondary to medical complications (Comment) (tachycardia) Patient left: in bed;with bed alarm set;with family/visitor present     Time: [id number] PT Time Calculation (min) (ACUTE ONLY): 26 min  Charges:   $Therapeutic Exercise: 23-37 mins                     G Codes:      Rica Koyanagi  PTA [facility]  Acute  Rehab Pager      [phone]

## 2016-05-20 NOTE — Care Management Important Message (Signed)
Important Message  Patient Details  Name: [name] [name] MRN: [id number] Date of Birth: 1941-02-10   Medicare Important Message Given:  Yes    Camillo Flaming 05/20/2016, 11:19 AMImportant Message  Patient Details  Name: [name] [name] MRN: [id number] Date of Birth: February 20, 1941   Medicare Important Message Given:  Yes    Camillo Flaming 05/20/2016, 11:19 AM

## 2016-05-20 NOTE — Progress Notes (Signed)
Pt urine concentrated and malodorous. No c/o pain or burning upon urination.

## 2016-05-20 NOTE — Progress Notes (Signed)
Subjective: 3 Days Post-Op Procedure(s) (LRB): LEFT TOTAL KNEE ARTHROPLASTY (Left) Patient reports pain as mild.   Patient seen in rounds with Dr. Wynelle Link. Patient reports that she is feeling better. No lightheadedness, SOB or chest pain. Has had some abnormal urine, very concentrated and malodorous as per the RN. No abdominal pain or dysuria. WBC normal. No temp.  Plan is to go Home after hospital stay.  Objective: Vital signs in last 24 hours: Temp:  [98.8 F (37.1 C)-99.8 F (37.7 C)] 99.8 F (37.7 C) (06/15 0441) Pulse Rate:  [85-156] 89 (06/15 0441) Resp:  [15-20] 18 (06/15 0441) BP: (114-132)/(53-92) 132/53 mmHg (06/15 0441) SpO2:  [84 %-97 %] 91 % (06/15 0441)  Intake/Output from previous day:  Intake/Output Summary (Last 24 hours) at 05/20/16 0743 Last data filed at 05/19/16 0824  Gross per 24 hour  Intake    100 ml  Output      0 ml  Net    100 ml     Labs:  Recent Labs  05/18/16 0409 05/19/16 0317 05/20/16 0445  HGB 10.8* 10.8* 9.4*    Recent Labs  05/19/16 0317 05/20/16 0445  WBC 9.9 9.8  RBC 3.44* 3.02*  HCT 32.4* 27.7*  PLT 188 189    Recent Labs  05/19/16 0317 05/20/16 0445  NA 137 135  K 2.7* 3.4*  CL 95* 96*  CO2 34* 31  BUN 8 10  CREATININE 0.69 0.73  GLUCOSE 138* 121*  CALCIUM 8.1* 8.3*    EXAM General - Patient is Alert and Oriented Extremity - Neurologically intact Intact pulses distally Dorsiflexion/Plantar flexion intact No cellulitis present Compartment soft Dressing/Incision - clean, dry, no drainage Motor Function - intact, moving foot and toes well on exam.   Past Medical History  Diagnosis Date  . Hypertension   . Hypothyroidism   . Chronic pain     lower back  . Bronchitis     history only  . Ulcer     hx  . Virus not detected 2010    postop, caught virus in hosp., respiratory symptoms, upon d/c to home, had allergic reaction /w plate size hives - not sure of the cause    . Sinus infection     recent  sinus infection, finished Zpak- 08/16/2013  . Female bladder prolapse   . Anemia     took iron for a while  . SVD (spontaneous vaginal delivery)     x 2  . Missed ab     x 6 - 2 surgery, 4 resolved on it's own  . Seasonal allergies   . Neuromuscular disorder ([place])     , left foot has some numbeness- uses cane  . PONV (postoperative nausea and vomiting)     states she had scop patch in past,states she sets off alarms when she is waking up, she "forgets to breathe"   . Allergy   . Blood transfusion without reported diagnosis   . Cataract   . Macular degeneration     wet- right eye  dry- left eye  . Hyperlipidemia   . Osteopenia   . Acquired left flat foot   . 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 Numbness     outer edge of heel left foot   . Tinnitus   . Imbalance   . Contusion of left hand   . Pneumonia   . Fracture of metatarsal bone     w nonunion   . Hallux rigidus   .  Urinary tract infection   . Urinary incontinence   . Acquired leg length discrepancy   . Metatarsalgia of left foot   . Trigger middle finger of left hand   . Allergic urticaria   . Shingles   . History of measles   . History of mumps   . History of rubella   . Menopause   . Chronic fatigue syndrome   . Arthritis     lower back, knees    Assessment/Plan: 3 Days Post-Op Procedure(s) (LRB): LEFT TOTAL KNEE ARTHROPLASTY (Left) Principal Problem:   OA (osteoarthritis) of knee Active Problems:   Hypokalemia   Sinus tachycardia (HCC)  Estimated body mass index is 32.66 kg/(m^2) as calculated from the following:   Height as of this encounter: 5\' 3"  (1.6 m).   Weight as of this encounter: 83.604 kg (184 lb 5 oz). Advance diet Up with therapy Discharge home with home health when medically stable  DVT Prophylaxis - Xarelto Weight-Bearing as tolerated   Patient is feeling better today. Will continue therapy today. If she progresses well and is cleared by medicine, DC home this  afternoon. Will give another couple doses of potassium supplementation today. Will hold off on UA as patient is asymptomatic. Encourage increased hydration.   Ardeen Jourdain PA-C Orthopaedic Surgery 05/20/2016, 7:43 AM

## 2016-05-21 NOTE — Discharge Summary (Signed)
Physician Discharge Summary   Patient ID: [name] [name] MRN: [id number] DOB/AGE: 1941/05/06 75 y.o.  Admit date: 05/17/2016 Discharge date: 05/20/2016  Primary Diagnosis: Primary osteoarthritis left knee  Admission Diagnoses:  Past Medical History  Diagnosis Date  . Hypertension   . Hypothyroidism   . Chronic pain     lower back  . Bronchitis     history only  . Ulcer     hx  . Virus not detected 2010    postop, caught virus in hosp., respiratory symptoms, upon d/c to home, had allergic reaction /w plate size hives - not sure of the cause    . Sinus infection     recent sinus infection, finished Zpak- 08/16/2013  . Female bladder prolapse   . Anemia     took iron for a while  . SVD (spontaneous vaginal delivery)     x 2  . Missed ab     x 6 - 2 surgery, 4 resolved on it's own  . Seasonal allergies   . Neuromuscular disorder ([place])     , left foot has some numbeness- uses cane  . PONV (postoperative nausea and vomiting)     states she had scop patch in past,states she sets off alarms when she is waking up, she "forgets to breathe"   . Allergy   . Blood transfusion without reported diagnosis   . Cataract   . Macular degeneration     wet- right eye  dry- left eye  . Hyperlipidemia   . Osteopenia   . Acquired left flat foot   . 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 Numbness     outer edge of heel left foot   . Tinnitus   . Imbalance   . Contusion of left hand   . Pneumonia   . Fracture of metatarsal bone     w nonunion   . Hallux rigidus   . Urinary tract infection   . Urinary incontinence   . Acquired leg length discrepancy   . Metatarsalgia of left foot   . Trigger middle finger of left hand   . Allergic urticaria   . Shingles   . History of measles   . History of mumps   . History of rubella   . Menopause   . Chronic fatigue syndrome   . Arthritis     lower back, knees   Discharge Diagnoses:   Principal Problem:   OA (osteoarthritis) of  knee Active Problems:   Hypokalemia   Sinus tachycardia (HCC)  Estimated body mass index is 32.66 kg/(m^2) as calculated from the following:   Height as of this encounter: 5' 3" (1.6 m).   Weight as of this encounter: 83.604 kg (184 lb 5 oz).  Procedure:  Procedure(s) (LRB): LEFT TOTAL KNEE ARTHROPLASTY (Left)   Consults: pulmonary/intensive care  HPI: [place], 75 y.o. female, has a history of pain and functional disability in the left knee due to arthritis and has failed non-surgical conservative treatments for greater than 12 weeks to includeNSAID's and/or analgesics, corticosteriod injections, use of assistive devices and activity modification. Onset of symptoms was gradual, starting >10 years ago with gradually worsening course since that time. The patient noted prior procedures on the knee to include arthroscopy and menisectomy on the left knee(s). Patient currently rates pain in the left knee(s) at 8 out of 10 with activity. Patient has night pain, worsening of pain with activity and weight bearing, pain that interferes  with activities of daily living, pain with passive range of motion, crepitus and joint swelling. Patient has evidence of periarticular osteophytes and joint space narrowing by imaging studies. There is no active infection.  Laboratory Data: Admission on 05/17/2016, Discharged on 05/20/2016  Component Date Value Ref Range Status  . WBC 05/18/2016 11.3* 4.0 - 10.5 K/uL Final  . RBC 05/18/2016 3.46* 3.87 - 5.11 MIL/uL Final  . Hemoglobin 05/18/2016 10.8* 12.0 - 15.0 g/dL Final  . HCT 05/18/2016 31.2* 36.0 - 46.0 % Final  . MCV 05/18/2016 90.2  78.0 - 100.0 fL Final  . MCH 05/18/2016 31.2  26.0 - 34.0 pg Final  . MCHC 05/18/2016 34.6  30.0 - 36.0 g/dL Final  . RDW 05/18/2016 13.6  11.5 - 15.5 % Final  . Platelets 05/18/2016 210  150 - 400 K/uL Final  . Sodium 05/18/2016 140  135 - 145 mmol/L Final  . Potassium 05/18/2016 3.7  3.5 - 5.1 mmol/L Final  . Chloride  05/18/2016 101  101 - 111 mmol/L Final  . CO2 05/18/2016 33* 22 - 32 mmol/L Final  . Glucose, Bld 05/18/2016 117* 65 - 99 mg/dL Final  . BUN 05/18/2016 12  6 - 20 mg/dL Final  . Creatinine, Ser 05/18/2016 0.81  0.44 - 1.00 mg/dL Final  . Calcium 05/18/2016 8.4* 8.9 - 10.3 mg/dL Final  . GFR calc non Af Amer 05/18/2016 >60  >60 mL/min Final  . GFR calc Af Amer 05/18/2016 >60  >60 mL/min Final   Comment: (NOTE) The eGFR has been calculated using the CKD EPI equation. This calculation has not been validated in all clinical situations. eGFR's persistently <60 mL/min signify possible Chronic Kidney Disease.   . Anion gap 05/18/2016 6  5 - 15 Final  . WBC 05/19/2016 9.9  4.0 - 10.5 K/uL Final  . RBC 05/19/2016 3.44* 3.87 - 5.11 MIL/uL Final  . Hemoglobin 05/19/2016 10.8* 12.0 - 15.0 g/dL Final  . HCT 05/19/2016 32.4* 36.0 - 46.0 % Final  . MCV 05/19/2016 94.2  78.0 - 100.0 fL Final  . MCH 05/19/2016 31.4  26.0 - 34.0 pg Final  . MCHC 05/19/2016 33.3  30.0 - 36.0 g/dL Final  . RDW 05/19/2016 13.9  11.5 - 15.5 % Final  . Platelets 05/19/2016 188  150 - 400 K/uL Final  . Sodium 05/19/2016 137  135 - 145 mmol/L Final  . Potassium 05/19/2016 2.7* 3.5 - 5.1 mmol/L Final   Comment: RESULT REPEATED AND VERIFIED DELTA CHECK NOTED CRITICAL RESULT CALLED TO, READ BACK BY AND VERIFIED WITH: M.SHRAM RN [id number] A.QUIZON   . Chloride 05/19/2016 95* 101 - 111 mmol/L Final  . CO2 05/19/2016 34* 22 - 32 mmol/L Final  . Glucose, Bld 05/19/2016 138* 65 - 99 mg/dL Final  . BUN 05/19/2016 8  6 - 20 mg/dL Final  . Creatinine, Ser 05/19/2016 0.69  0.44 - 1.00 mg/dL Final  . Calcium 05/19/2016 8.1* 8.9 - 10.3 mg/dL Final  . GFR calc non Af Amer 05/19/2016 >60  >60 mL/min Final  . GFR calc Af Amer 05/19/2016 >60  >60 mL/min Final   Comment: (NOTE) The eGFR has been calculated using the CKD EPI equation. This calculation has not been validated in all clinical situations. eGFR's persistently <60 mL/min  signify possible Chronic Kidney Disease.   . Anion gap 05/19/2016 8  5 - 15 Final  . Troponin I 05/18/2016 <0.03  <0.031 ng/mL Final   Comment:  NO INDICATION OF MYOCARDIAL INJURY.   . O2 Content 05/19/2016 3.0   Final  . Delivery systems 05/19/2016 NASAL CANNULA   Final  . pH, Arterial 05/19/2016 7.519* 7.350 - 7.450 Final  . pCO2 arterial 05/19/2016 40.2  35.0 - 45.0 mmHg Final  . pO2, Arterial 05/19/2016 64.7* 80.0 - 100.0 mmHg Final  . Bicarbonate 05/19/2016 32.6* 20.0 - 24.0 mEq/L Final  . TCO2 05/19/2016 29.1  0 - 100 mmol/L Final  . Acid-Base Excess 05/19/2016 9.0* 0.0 - 2.0 mmol/L Final  . O2 Saturation 05/19/2016 93.9   Final  . Patient temperature 05/19/2016 98.6   Final  . Collection site 05/19/2016 RIGHT RADIAL   Final  . Drawn by 05/19/2016 517001   Final  . Sample type 05/19/2016 ARTERIAL DRAW   Final  . [place]y Reading test (pass/fail) 05/19/2016 PASS  PASS Final  . Troponin I 05/19/2016 <0.03  <0.031 ng/mL Final   Comment:        NO INDICATION OF MYOCARDIAL INJURY.   . Troponin I 05/19/2016 <0.03  <0.031 ng/mL Final   Comment:        NO INDICATION OF MYOCARDIAL INJURY.   . Troponin I 05/19/2016 <0.03  <0.031 ng/mL Final   Comment:        NO INDICATION OF MYOCARDIAL INJURY.   . Magnesium 05/19/2016 1.8  1.7 - 2.4 mg/dL Final  . TSH 05/19/2016 0.452  0.350 - 4.500 uIU/mL Final  . WBC 05/20/2016 9.8  4.0 - 10.5 K/uL Final  . RBC 05/20/2016 3.02* 3.87 - 5.11 MIL/uL Final  . Hemoglobin 05/20/2016 9.4* 12.0 - 15.0 g/dL Final  . HCT 05/20/2016 27.7* 36.0 - 46.0 % Final  . MCV 05/20/2016 91.7  78.0 - 100.0 fL Final  . MCH 05/20/2016 31.1  26.0 - 34.0 pg Final  . MCHC 05/20/2016 33.9  30.0 - 36.0 g/dL Final  . RDW 05/20/2016 13.9  11.5 - 15.5 % Final  . Platelets 05/20/2016 189  150 - 400 K/uL Final  . Sodium 05/20/2016 135  135 - 145 mmol/L Final  . Potassium 05/20/2016 3.4* 3.5 - 5.1 mmol/L Final   Comment: RESULT REPEATED AND VERIFIED DELTA CHECK NOTED NO  VISIBLE HEMOLYSIS   . Chloride 05/20/2016 96* 101 - 111 mmol/L Final  . CO2 05/20/2016 31  22 - 32 mmol/L Final  . Glucose, Bld 05/20/2016 121* 65 - 99 mg/dL Final  . BUN 05/20/2016 10  6 - 20 mg/dL Final  . Creatinine, Ser 05/20/2016 0.73  0.44 - 1.00 mg/dL Final  . Calcium 05/20/2016 8.3* 8.9 - 10.3 mg/dL Final  . GFR calc non Af Amer 05/20/2016 >60  >60 mL/min Final  . GFR calc Af Amer 05/20/2016 >60  >60 mL/min Final   Comment: (NOTE) The eGFR has been calculated using the CKD EPI equation. This calculation has not been validated in all clinical situations. eGFR's persistently <60 mL/min signify possible Chronic Kidney Disease.   Georgiann Hahn gap 05/20/2016 8  5 - 15 Final  Hospital Outpatient Visit on 05/05/2016  Component Date Value Ref Range Status  . aPTT 05/05/2016 36  24 - 37 seconds Final  . WBC 05/05/2016 7.2  4.0 - 10.5 K/uL Final  . RBC 05/05/2016 4.00  3.87 - 5.11 MIL/uL Final  . Hemoglobin 05/05/2016 12.3  12.0 - 15.0 g/dL Final  . HCT 05/05/2016 36.2  36.0 - 46.0 % Final  . MCV 05/05/2016 90.5  78.0 - 100.0 fL Final  . MCH 05/05/2016 30.8  26.0 -  34.0 pg Final  . MCHC 05/05/2016 34.0  30.0 - 36.0 g/dL Final  . RDW 05/05/2016 13.6  11.5 - 15.5 % Final  . Platelets 05/05/2016 217  150 - 400 K/uL Final  . Sodium 05/05/2016 135  135 - 145 mmol/L Final  . Potassium 05/05/2016 3.8  3.5 - 5.1 mmol/L Final  . Chloride 05/05/2016 97* 101 - 111 mmol/L Final  . CO2 05/05/2016 30  22 - 32 mmol/L Final  . Glucose, Bld 05/05/2016 94  65 - 99 mg/dL Final  . BUN 05/05/2016 15  6 - 20 mg/dL Final  . Creatinine, Ser 05/05/2016 0.89  0.44 - 1.00 mg/dL Final  . Calcium 05/05/2016 9.2  8.9 - 10.3 mg/dL Final  . Total Protein 05/05/2016 7.4  6.5 - 8.1 g/dL Final  . Albumin 05/05/2016 4.4  3.5 - 5.0 g/dL Final  . AST 05/05/2016 25  15 - 41 U/L Final  . ALT 05/05/2016 22  14 - 54 U/L Final  . Alkaline Phosphatase 05/05/2016 95  38 - 126 U/L Final  . Total Bilirubin 05/05/2016 0.9  0.3 -  1.2 mg/dL Final  . GFR calc non Af Amer 05/05/2016 >60  >60 mL/min Final  . GFR calc Af Amer 05/05/2016 >60  >60 mL/min Final   Comment: (NOTE) The eGFR has been calculated using the CKD EPI equation. This calculation has not been validated in all clinical situations. eGFR's persistently <60 mL/min signify possible Chronic Kidney Disease.   . Anion gap 05/05/2016 8  5 - 15 Final  . Prothrombin Time 05/05/2016 14.3  11.6 - 15.2 seconds Final  . INR 05/05/2016 1.13  0.00 - 1.49 Final  . ABO/RH(D) 05/05/2016 O POS   Final  . Antibody Screen 05/05/2016 NEG   Final  . Sample Expiration 05/05/2016 05/19/2016   Final  . Extend sample reason 05/05/2016 NO TRANSFUSIONS OR PREGNANCY IN THE PAST 3 MONTHS   Final  . Color, Urine 05/05/2016 YELLOW  YELLOW Final  . APPearance 05/05/2016 CLEAR  CLEAR Final  . Specific Gravity, Urine 05/05/2016 1.004* 1.005 - 1.030 Final  . pH 05/05/2016 6.5  5.0 - 8.0 Final  . Glucose, UA 05/05/2016 NEGATIVE  NEGATIVE mg/dL Final  . Hgb urine dipstick 05/05/2016 NEGATIVE  NEGATIVE Final  . Bilirubin Urine 05/05/2016 NEGATIVE  NEGATIVE Final  . Ketones, ur 05/05/2016 NEGATIVE  NEGATIVE mg/dL Final  . Protein, ur 05/05/2016 NEGATIVE  NEGATIVE mg/dL Final  . Nitrite 05/05/2016 NEGATIVE  NEGATIVE Final  . Leukocytes, UA 05/05/2016 NEGATIVE  NEGATIVE Final   MICROSCOPIC NOT DONE ON URINES WITH NEGATIVE PROTEIN, BLOOD, LEUKOCYTES, NITRITE, OR GLUCOSE <1000 mg/dL.  Marland Kitchen MRSA, PCR 05/05/2016 NEGATIVE  NEGATIVE Final  . Staphylococcus aureus 05/05/2016 NEGATIVE  NEGATIVE Final   Comment:        The Xpert SA Assay (FDA approved for NASAL specimens in patients over 59 years of age), is one component of a comprehensive surveillance program.  Test performance has been validated by [facility] for patients greater than or equal to 105 year old. It is not intended to diagnose infection nor to guide or monitor treatment.      X-Rays:Ct Angio Chest Pe W/cm &/or Wo  Cm  05/19/2016  CLINICAL DATA:  Tachycardia and decreased oxygen saturations after knee surgery on 05/17/2016. EXAM: CT ANGIOGRAPHY CHEST WITH CONTRAST TECHNIQUE: Multidetector CT imaging of the chest was performed using the standard protocol during bolus administration of intravenous contrast. Multiplanar CT image reconstructions and MIPs were obtained  to evaluate the vascular anatomy. CONTRAST:  100 mL Isovue 370 COMPARISON:  None. FINDINGS: Technically adequate study with good opacification of the central and segmental pulmonary arteries. No focal filling defects are demonstrated. No evidence of significant pulmonary embolus. Normal heart size. Mild coronary artery calcification. Normal caliber thoracic aorta. No aneurysm or dissection. Great vessel origins are patent. Esophagus is decompressed. No significant lymphadenopathy in the chest. Dependent atelectasis in the lung bases. No focal consolidation. Airways are patent. No pleural effusions. No pneumothorax. Included portions of the upper abdominal organs are grossly unremarkable. Degenerative changes in the spine. No destructive bone lesions. Review of the MIP images confirms the above findings. IMPRESSION: No evidence of significant pulmonary embolus. Atelectasis in the lung bases. Electronically Signed   By: Lucienne Capers M.D.   On: 05/19/2016 05:51   Dg Chest Port 1 View  05/19/2016  CLINICAL DATA:  Atelectasis. EXAM: PORTABLE CHEST 1 VIEW COMPARISON:  05/18/2016. FINDINGS: Mediastinum hilar structures normal. Low lung volumes with mild bibasilar atelectasis and/or infiltrates. Small left pleural effusion. No pneumothorax. Right shoulder replacement. Lumbar spine fusion. IMPRESSION: Bibasilar subsegmental atelectasis and/or infiltrates. Small left pleural effusion. No significant interim change from prior exam. Electronically Signed   By: [place]   On: 05/19/2016 06:48   Dg Chest Port 1 View  05/18/2016  CLINICAL DATA:  Acute onset of  decreased O2 saturation. Initial encounter. EXAM: PORTABLE CHEST 1 VIEW COMPARISON:  Chest radiograph performed 11/12/2015 FINDINGS: Mild left basilar airspace opacity could reflect atelectasis or mild pneumonia. No definite pleural effusion or pneumothorax is seen. The cardiomediastinal silhouette is normal in size. No acute osseous abnormalities are seen. A right shoulder arthroplasty is grossly unremarkable in appearance, though incompletely imaged. IMPRESSION: Mild left basilar airspace opacity could reflect atelectasis or mild pneumonia. Electronically Signed   By: Garald Balding M.D.   On: 05/18/2016 18:41    EKG: Orders placed or performed during the hospital encounter of 05/17/16  . EKG 12-Lead  . EKG 12-Lead  . EKG 12-Lead  . EKG 12-Lead  . EKG 12-Lead  . EKG 12-Lead     Hospital Course: INA SCRIVENS is a 75 y.o. who was admitted to [facility]. They were brought to the operating room on 05/17/2016 and underwent Procedure(s): LEFT TOTAL KNEE ARTHROPLASTY.  Patient tolerated the procedure well and was later transferred to the recovery room and then to the orthopaedic floor for postoperative care.  They were given PO and IV analgesics for pain control following their surgery.  They were given 24 hours of postoperative antibiotics of  Anti-infectives    Start     Dose/Rate Route Frequency Ordered Stop   05/17/16 1800  ceFAZolin (ANCEF) IVPB 2g/100 mL premix     2 g 200 mL/hr over 30 Minutes Intravenous Every 6 hours 05/17/16 1510 05/17/16 2353   05/17/16 0925  ceFAZolin (ANCEF) IVPB 2g/100 mL premix     2 g 200 mL/hr over 30 Minutes Intravenous On call to O.R. 05/17/16 4259 05/17/16 1157     and started on DVT prophylaxis in the form of Xarelto.   PT and OT were ordered for total joint protocol.  Discharge planning consulted to help with postop disposition and equipment needs.  Patient had a fair night on the evening of surgery. She started to get up OOB with therapy on day one.  Hemovac drain was pulled without difficulty. She developed poor saturation post op day one, as well as some sedation, secondary to  pain medication. Medicine was consulted and CT showed no PE. She tapered off of nasal cannula O2. Continued to work with therapy into day two once stablilized.  Dressing was changed on day two and the incision was clean and dry.  By day three, the patient had progressed with therapy and meeting their goals.  Incision was healing well.  Patient was seen in rounds and was ready to go home.   Diet: Cardiac diet Activity:WBAT Follow-up:in 2 weeks Disposition - Home Discharged Condition: stable   Discharge Instructions    Call MD / Call 911    Complete by:  As directed   If you experience chest pain or shortness of breath, CALL 911 and be transported to the hospital emergency room.  If you develope a fever above 101 F, pus (white drainage) or increased drainage or redness at the wound, or calf pain, call your surgeon's office.     Constipation Prevention    Complete by:  As directed   Drink plenty of fluids.  Prune juice may be helpful.  You may use a stool softener, such as Colace (over the counter) 100 mg twice a day.  Use MiraLax (over the counter) for constipation as needed.     Diet - low sodium heart healthy    Complete by:  As directed      Discharge instructions    Complete by:  As directed   Dr. Gaynelle Arabian Total Joint Specialist [facility] [address] [place], [place] [address] [phone]  TOTAL KNEE REPLACEMENT POSTOPERATIVE DIRECTIONS  Knee Rehabilitation, Guidelines Following Surgery  Results after knee surgery are often greatly improved when you follow the exercise, range of motion and muscle strengthening exercises prescribed by your doctor. Safety measures are also important to protect the knee from further injury. Any time any of these exercises cause you to have increased pain or swelling in your knee joint,  decrease the amount until you are comfortable again and slowly increase them. If you have problems or questions, call your caregiver or physical therapist for advice.   HOME CARE INSTRUCTIONS  Remove items at home which could result in a fall. This includes throw rugs or furniture in walking pathways.  ICE to the affected knee every three hours for 30 minutes at a time and then as needed for pain and swelling.  Continue to use ice on the knee for pain and swelling from surgery. You may notice swelling that will progress down to the foot and ankle.  This is normal after surgery.  Elevate the leg when you are not up walking on it.   Continue to use the breathing machine which will help keep your temperature down.  It is common for your temperature to cycle up and down following surgery, especially at night when you are not up moving around and exerting yourself.  The breathing machine keeps your lungs expanded and your temperature down. Do not place pillow under knee, focus on keeping the knee straight while resting  DIET You may resume your previous home diet once your are discharged from the hospital.  DRESSING / WOUND CARE / SHOWERING You may start showering once you are discharged home but do not submerge the incision under water. Just pat the incision dry and apply a dry gauze dressing on daily. Change the surgical dressing daily and reapply a dry dressing each time.  ACTIVITY Walk with your walker as instructed. Use walker as long as suggested by your caregivers. Avoid  periods of inactivity such as sitting longer than an hour when not asleep. This helps prevent blood clots.  You may resume a sexual relationship in one month or when given the OK by your doctor.  You may return to work once you are cleared by your doctor.  Do not drive a car for 6 weeks or until released by you surgeon.  Do not drive while taking narcotics.  WEIGHT BEARING Weight bearing as tolerated with assist device  (walker, cane, etc) as directed, use it as long as suggested by your surgeon or therapist, typically at least 4-6 weeks.  POSTOPERATIVE CONSTIPATION PROTOCOL Constipation - defined medically as fewer than three stools per week and severe constipation as less than one stool per week.  One of the most common issues patients have following surgery is constipation.  Even if you have a regular bowel pattern at home, your normal regimen is likely to be disrupted due to multiple reasons following surgery.  Combination of anesthesia, postoperative narcotics, change in appetite and fluid intake all can affect your bowels.  In order to avoid complications following surgery, here are some recommendations in order to help you during your recovery period.  Colace (docusate) - Pick up an over-the-counter form of Colace or another stool softener and take twice a day as long as you are requiring postoperative pain medications.  Take with a full glass of water daily.  If you experience loose stools or diarrhea, hold the colace until you stool forms back up.  If your symptoms do not get better within 1 week or if they get worse, check with your doctor.  Dulcolax (bisacodyl) - Pick up over-the-counter and take as directed by the product packaging as needed to assist with the movement of your bowels.  Take with a full glass of water.  Use this product as needed if not relieved by Colace only.   MiraLax (polyethylene glycol) - Pick up over-the-counter to have on hand.  MiraLax is a solution that will increase the amount of water in your bowels to assist with bowel movements.  Take as directed and can mix with a glass of water, juice, soda, coffee, or tea.  Take if you go more than two days without a movement. Do not use MiraLax more than once per day. Call your doctor if you are still constipated or irregular after using this medication for 7 days in a row.  If you continue to have problems with postoperative constipation,  please contact the office for further assistance and recommendations.  If you experience "the worst abdominal pain ever" or develop nausea or vomiting, please contact the office immediatly for further recommendations for treatment.  ITCHING  If you experience itching with your medications, try taking only a single pain pill, or even half a pain pill at a time.  You can also use Benadryl over the counter for itching or also to help with sleep.   TED HOSE STOCKINGS Wear the elastic stockings on both legs for three weeks following surgery during the day but you may remove then at night for sleeping.  MEDICATIONS See your medication summary on the "After Visit Summary" that the nursing staff will review with you prior to discharge.  You may have some home medications which will be placed on hold until you complete the course of blood thinner medication.  It is important for you to complete the blood thinner medication as prescribed by your surgeon.  Continue your approved medications as instructed at  time of discharge.  PRECAUTIONS If you experience chest pain or shortness of breath - call 911 immediately for transfer to the hospital emergency department.  If you develop a fever greater that 101 F, purulent drainage from wound, increased redness or drainage from wound, foul odor from the wound/dressing, or calf pain - CONTACT YOUR SURGEON.                                                   FOLLOW-UP APPOINTMENTS Make sure you keep all of your appointments after your operation with your surgeon and caregivers. You should call the office at the above phone number and make an appointment for approximately two weeks after the date of your surgery or on the date instructed by your surgeon outlined in the "After Visit Summary".   RANGE OF MOTION AND STRENGTHENING EXERCISES  Rehabilitation of the knee is important following a knee injury or an operation. After just a few days of immobilization, the muscles  of the thigh which control the knee become weakened and shrink (atrophy). Knee exercises are designed to build up the tone and strength of the thigh muscles and to improve knee motion. Often times heat used for twenty to thirty minutes before working out will loosen up your tissues and help with improving the range of motion but do not use heat for the first two weeks following surgery. These exercises can be done on a training (exercise) mat, on the floor, on a table or on a bed. Use what ever works the best and is most comfortable for you Knee exercises include:  Leg Lifts - While your knee is still immobilized in a splint or cast, you can do straight leg raises. Lift the leg to 60 degrees, hold for 3 sec, and slowly lower the leg. Repeat 10-20 times 2-3 times daily. Perform this exercise against resistance later as your knee gets better.  Quad and Hamstring Sets - Tighten up the muscle on the front of the thigh (Quad) and hold for 5-10 sec. Repeat this 10-20 times hourly. Hamstring sets are done by pushing the foot backward against an object and holding for 5-10 sec. Repeat as with quad sets.  Leg Slides: Lying on your back, slowly slide your foot toward your buttocks, bending your knee up off the floor (only go as far as is comfortable). Then slowly slide your foot back down until your leg is flat on the floor again. Angel Wings: Lying on your back spread your legs to the side as far apart as you can without causing discomfort.  A rehabilitation program following serious knee injuries can speed recovery and prevent re-injury in the future due to weakened muscles. Contact your doctor or a physical therapist for more information on knee rehabilitation.   IF YOU ARE TRANSFERRED TO A SKILLED REHAB FACILITY If the patient is transferred to a skilled rehab facility following release from the hospital, a list of the current medications will be sent to the facility for the patient to continue.  When discharged  from the skilled rehab facility, please have the facility set up the patient's [place] prior to being released. Also, the skilled facility will be responsible for providing the patient with their medications at time of release from the facility to include their pain medication, the muscle relaxants, and their blood  thinner medication. If the patient is still at the rehab facility at time of the two week follow up appointment, the skilled rehab facility will also need to assist the patient in arranging follow up appointment in our office and any transportation needs.  MAKE SURE YOU:  Understand these instructions.  Get help right away if you are not doing well or get worse.    Pick up stool softner and laxative for home use following surgery while on pain medications. Do not submerge incision under water. Please use good hand washing techniques while changing dressing each day. May shower starting three days after surgery. Please use a clean towel to pat the incision dry following showers. Continue to use ice for pain and swelling after surgery. Do not use any lotions or creams on the incision until instructed by your surgeon.     Discharge instructions    Complete by:  As directed   Dr. Gaynelle Arabian Total Joint Specialist [facility] [address], [place], [place] [address] [phone]  TOTAL KNEE REPLACEMENT POSTOPERATIVE DIRECTIONS  Knee Rehabilitation, Guidelines Following Surgery  Results after knee surgery are often greatly improved when you follow the exercise, range of motion and muscle strengthening exercises prescribed by your doctor. Safety measures are also important to protect the knee from further injury. Any time any of these exercises cause you to have increased pain or swelling in your knee joint, decrease the amount until you are comfortable again and slowly increase them. If you have problems or questions, call your caregiver  or physical therapist for advice.   HOME CARE INSTRUCTIONS  Remove items at home which could result in a fall. This includes throw rugs or furniture in walking pathways.  ICE to the affected knee every three hours for 30 minutes at a time and then as needed for pain and swelling.  Continue to use ice on the knee for pain and swelling from surgery. You may notice swelling that will progress down to the foot and ankle.  This is normal after surgery.  Elevate the leg when you are not up walking on it.   Continue to use the breathing machine which will help keep your temperature down.  It is common for your temperature to cycle up and down following surgery, especially at night when you are not up moving around and exerting yourself.  The breathing machine keeps your lungs expanded and your temperature down. Do not place pillow under knee, focus on keeping the knee straight while resting  DIET You may resume your previous home diet once your are discharged from the hospital.  DRESSING / WOUND CARE / SHOWERING You may start showering once you are discharged home but do not submerge the incision under water. Just pat the incision dry and apply a dry gauze dressing on daily. Change the surgical dressing daily and reapply a dry dressing each time.  ACTIVITY Walk with your walker as instructed. Use walker as long as suggested by your caregivers. Avoid periods of inactivity such as sitting longer than an hour when not asleep. This helps prevent blood clots.  You may resume a sexual relationship in one month or when given the OK by your doctor.  You may return to work once you are cleared by your doctor.  Do not drive a car for 6 weeks or until released by you surgeon.  Do not drive while taking narcotics.  WEIGHT BEARING Weight bearing as tolerated with assist device (walker, cane, etc)  as directed, use it as long as suggested by your surgeon or therapist, typically at least 4-6  weeks.  POSTOPERATIVE CONSTIPATION PROTOCOL Constipation - defined medically as fewer than three stools per week and severe constipation as less than one stool per week.  One of the most common issues patients have following surgery is constipation.  Even if you have a regular bowel pattern at home, your normal regimen is likely to be disrupted due to multiple reasons following surgery.  Combination of anesthesia, postoperative narcotics, change in appetite and fluid intake all can affect your bowels.  In order to avoid complications following surgery, here are some recommendations in order to help you during your recovery period.  Colace (docusate) - Pick up an over-the-counter form of Colace or another stool softener and take twice a day as long as you are requiring postoperative pain medications.  Take with a full glass of water daily.  If you experience loose stools or diarrhea, hold the colace until you stool forms back up.  If your symptoms do not get better within 1 week or if they get worse, check with your doctor.  Dulcolax (bisacodyl) - Pick up over-the-counter and take as directed by the product packaging as needed to assist with the movement of your bowels.  Take with a full glass of water.  Use this product as needed if not relieved by Colace only.   MiraLax (polyethylene glycol) - Pick up over-the-counter to have on hand.  MiraLax is a solution that will increase the amount of water in your bowels to assist with bowel movements.  Take as directed and can mix with a glass of water, juice, soda, coffee, or tea.  Take if you go more than two days without a movement. Do not use MiraLax more than once per day. Call your doctor if you are still constipated or irregular after using this medication for 7 days in a row.  If you continue to have problems with postoperative constipation, please contact the office for further assistance and recommendations.  If you experience "the worst abdominal  pain ever" or develop nausea or vomiting, please contact the office immediatly for further recommendations for treatment.  ITCHING  If you experience itching with your medications, try taking only a single pain pill, or even half a pain pill at a time.  You can also use Benadryl over the counter for itching or also to help with sleep.   TED HOSE STOCKINGS Wear the elastic stockings on both legs for three weeks following surgery during the day but you may remove then at night for sleeping.  MEDICATIONS See your medication summary on the "After Visit Summary" that the nursing staff will review with you prior to discharge.  You may have some home medications which will be placed on hold until you complete the course of blood thinner medication.  It is important for you to complete the blood thinner medication as prescribed by your surgeon.  Continue your approved medications as instructed at time of discharge.  PRECAUTIONS If you experience chest pain or shortness of breath - call 911 immediately for transfer to the hospital emergency department.  If you develop a fever greater that 101 F, purulent drainage from wound, increased redness or drainage from wound, foul odor from the wound/dressing, or calf pain - CONTACT YOUR SURGEON.  FOLLOW-UP APPOINTMENTS Make sure you keep all of your appointments after your operation with your surgeon and caregivers. You should call the office at the above phone number and make an appointment for approximately two weeks after the date of your surgery or on the date instructed by your surgeon outlined in the "After Visit Summary".   RANGE OF MOTION AND STRENGTHENING EXERCISES  Rehabilitation of the knee is important following a knee injury or an operation. After just a few days of immobilization, the muscles of the thigh which control the knee become weakened and shrink (atrophy). Knee exercises are designed to build  up the tone and strength of the thigh muscles and to improve knee motion. Often times heat used for twenty to thirty minutes before working out will loosen up your tissues and help with improving the range of motion but do not use heat for the first two weeks following surgery. These exercises can be done on a training (exercise) mat, on the floor, on a table or on a bed. Use what ever works the best and is most comfortable for you Knee exercises include:  Leg Lifts - While your knee is still immobilized in a splint or cast, you can do straight leg raises. Lift the leg to 60 degrees, hold for 3 sec, and slowly lower the leg. Repeat 10-20 times 2-3 times daily. Perform this exercise against resistance later as your knee gets better.  Quad and Hamstring Sets - Tighten up the muscle on the front of the thigh (Quad) and hold for 5-10 sec. Repeat this 10-20 times hourly. Hamstring sets are done by pushing the foot backward against an object and holding for 5-10 sec. Repeat as with quad sets.  Leg Slides: Lying on your back, slowly slide your foot toward your buttocks, bending your knee up off the floor (only go as far as is comfortable). Then slowly slide your foot back down until your leg is flat on the floor again. Angel Wings: Lying on your back spread your legs to the side as far apart as you can without causing discomfort.  A rehabilitation program following serious knee injuries can speed recovery and prevent re-injury in the future due to weakened muscles. Contact your doctor or a physical therapist for more information on knee rehabilitation.   IF YOU ARE TRANSFERRED TO A SKILLED REHAB FACILITY If the patient is transferred to a skilled rehab facility following release from the hospital, a list of the current medications will be sent to the facility for the patient to continue.  When discharged from the skilled rehab facility, please have the facility set up the patient's [place]  prior to being released. Also, the skilled facility will be responsible for providing the patient with their medications at time of release from the facility to include their pain medication, the muscle relaxants, and their blood thinner medication. If the patient is still at the rehab facility at time of the two week follow up appointment, the skilled rehab facility will also need to assist the patient in arranging follow up appointment in our office and any transportation needs.  MAKE SURE YOU:  Understand these instructions.  Get help right away if you are not doing well or get worse.    Pick up stool softner and laxative for home use following surgery while on pain medications. Do not submerge incision under water. Please use good hand washing techniques while changing dressing each day. May shower starting three days after surgery.  Please use a clean towel to pat the incision dry following showers. Continue to use ice for pain and swelling after surgery. Do not use any lotions or creams on the incision until instructed by your surgeon.     Increase activity slowly as tolerated    Complete by:  As directed             Medication List    STOP taking these medications        aspirin EC 81 MG tablet     EPIPEN 2-PAK 0.3 mg/0.3 mL Soaj injection  Generic drug:  EPINEPHrine     etodolac 400 MG tablet  Commonly known as:  LODINE     fluorometholone 0.1 % ophthalmic suspension  Commonly known as:  FML     magnesium oxide 400 MG tablet  Commonly known as:  MAG-OX     multivitamin with minerals Tabs tablet     OVER THE COUNTER MEDICATION     OVER THE COUNTER MEDICATION     PRESERVISION AREDS PO     REFRESH TEARS OP     vitamin C 1000 MG tablet      TAKE these medications        atorvastatin 20 MG tablet  Commonly known as:  LIPITOR  Take 20 mg by mouth at bedtime.     buPROPion 100 MG 12 hr tablet  Commonly known as:  WELLBUTRIN SR  Take 100 mg by mouth daily.      CALCIUM 1200+D3 PO  Take 1 tablet by mouth 2 (two) times daily.     carvedilol 6.25 MG tablet  Commonly known as:  COREG  Take 6.25 mg by mouth 2 (two) times daily with a meal.     diazepam 5 MG tablet  Commonly known as:  VALIUM  Take 5 mg by mouth every 6 (six) hours as needed (For back spasms.).     fluticasone 50 MCG/ACT nasal spray  Commonly known as:  FLONASE  Place 2 sprays into both nostrils daily as needed for allergies.     furosemide 40 MG tablet  Commonly known as:  LASIX  Take 40 mg by mouth daily before supper.     gabapentin 600 MG tablet  Commonly known as:  NEURONTIN  Take 600 mg by mouth 3 (three) times daily.     KLS ALLERCLEAR 10 MG tablet  Generic drug:  loratadine  Take 10 mg by mouth daily as needed for allergies.     levothyroxine 50 MCG tablet  Commonly known as:  SYNTHROID  Take 1 tablet (50 mcg total) by mouth daily before breakfast.     methadone 5 MG tablet  Commonly known as:  DOLOPHINE  Take 5 mg by mouth every 8 (eight) hours.     oxyCODONE 5 MG immediate release tablet  Commonly known as:  Oxy IR/ROXICODONE  Take 1-3 tablets (5-15 mg total) by mouth every 4 (four) hours as needed for moderate pain or severe pain.     potassium chloride 10 MEQ tablet  Commonly known as:  K-DUR,KLOR-CON  Take 1 tablet (10 mEq total) by mouth daily. To be taken along with Lasix     rivaroxaban 10 MG Tabs tablet  Commonly known as:  XARELTO  Take 1 tablet (10 mg total) by mouth daily with breakfast.     senna 8.6 MG tablet  Commonly known as:  SENOKOT  Take 2-3 tablets by mouth 2 (two) times daily. Take two tablets in the  morning and three tablets in the evening.     traMADol 50 MG tablet  Commonly known as:  ULTRAM  Take 1-2 tablets (50-100 mg total) by mouth every 6 (six) hours as needed (mild pain).     Vitamin B-12 5000 MCG Tbdp  Take 5,000 mcg by mouth daily.           Follow-up Information    Follow up with Gearlean Alf, MD.  Schedule an appointment as soon as possible for a visit on 06/01/2016.   Specialty:  Orthopedic Surgery   Why:  Call [phone] tomorrow to make the appointment   Contact information:   [address] [place] [address] [phone]       Follow up with Emily.   Why:  rolling walker   Contact information:   [address] [place] [address] [phone]       Follow up with [facility].   Why:  home health physical therapy   Contact information:   [address] [place] [address] [phone]       Signed: Ardeen Jourdain, PA-C Orthopaedic Surgery 05/21/2016, 8:27 AM

## 2016-05-22 DIAGNOSIS — M479 Spondylosis, unspecified: Secondary | ICD-10-CM | POA: Diagnosis not present

## 2016-05-22 DIAGNOSIS — H3530 Unspecified macular degeneration: Secondary | ICD-10-CM | POA: Diagnosis not present

## 2016-05-22 DIAGNOSIS — G709 Myoneural disorder, unspecified: Secondary | ICD-10-CM | POA: Diagnosis not present

## 2016-05-22 DIAGNOSIS — Z471 Aftercare following joint replacement surgery: Secondary | ICD-10-CM | POA: Diagnosis not present

## 2016-05-22 DIAGNOSIS — G8929 Other chronic pain: Secondary | ICD-10-CM | POA: Diagnosis not present

## 2016-05-22 DIAGNOSIS — I10 Essential (primary) hypertension: Secondary | ICD-10-CM | POA: Diagnosis not present

## 2016-05-24 DIAGNOSIS — Z471 Aftercare following joint replacement surgery: Secondary | ICD-10-CM | POA: Diagnosis not present

## 2016-05-24 DIAGNOSIS — H3530 Unspecified macular degeneration: Secondary | ICD-10-CM | POA: Diagnosis not present

## 2016-05-24 DIAGNOSIS — G8929 Other chronic pain: Secondary | ICD-10-CM | POA: Diagnosis not present

## 2016-05-24 DIAGNOSIS — I10 Essential (primary) hypertension: Secondary | ICD-10-CM | POA: Diagnosis not present

## 2016-05-24 DIAGNOSIS — M479 Spondylosis, unspecified: Secondary | ICD-10-CM | POA: Diagnosis not present

## 2016-05-24 DIAGNOSIS — G709 Myoneural disorder, unspecified: Secondary | ICD-10-CM | POA: Diagnosis not present

## 2016-05-26 DIAGNOSIS — M479 Spondylosis, unspecified: Secondary | ICD-10-CM | POA: Diagnosis not present

## 2016-05-26 DIAGNOSIS — H3530 Unspecified macular degeneration: Secondary | ICD-10-CM | POA: Diagnosis not present

## 2016-05-26 DIAGNOSIS — G8929 Other chronic pain: Secondary | ICD-10-CM | POA: Diagnosis not present

## 2016-05-26 DIAGNOSIS — Z471 Aftercare following joint replacement surgery: Secondary | ICD-10-CM | POA: Diagnosis not present

## 2016-05-26 DIAGNOSIS — G709 Myoneural disorder, unspecified: Secondary | ICD-10-CM | POA: Diagnosis not present

## 2016-05-26 DIAGNOSIS — I10 Essential (primary) hypertension: Secondary | ICD-10-CM | POA: Diagnosis not present

## 2016-05-28 DIAGNOSIS — M1712 Unilateral primary osteoarthritis, left knee: Secondary | ICD-10-CM | POA: Diagnosis not present

## 2016-05-31 DIAGNOSIS — M1712 Unilateral primary osteoarthritis, left knee: Secondary | ICD-10-CM | POA: Diagnosis not present

## 2016-06-01 DIAGNOSIS — Z471 Aftercare following joint replacement surgery: Secondary | ICD-10-CM | POA: Diagnosis not present

## 2016-06-01 DIAGNOSIS — Z96652 Presence of left artificial knee joint: Secondary | ICD-10-CM | POA: Diagnosis not present

## 2016-06-02 DIAGNOSIS — M1712 Unilateral primary osteoarthritis, left knee: Secondary | ICD-10-CM | POA: Diagnosis not present

## 2016-06-04 DIAGNOSIS — M1712 Unilateral primary osteoarthritis, left knee: Secondary | ICD-10-CM | POA: Diagnosis not present

## 2016-06-07 DIAGNOSIS — M1712 Unilateral primary osteoarthritis, left knee: Secondary | ICD-10-CM | POA: Diagnosis not present

## 2016-06-07 DIAGNOSIS — H2513 Age-related nuclear cataract, bilateral: Secondary | ICD-10-CM | POA: Diagnosis not present

## 2016-06-07 DIAGNOSIS — H0015 Chalazion left lower eyelid: Secondary | ICD-10-CM | POA: Diagnosis not present

## 2016-06-07 DIAGNOSIS — H01029 Squamous blepharitis unspecified eye, unspecified eyelid: Secondary | ICD-10-CM | POA: Diagnosis not present

## 2016-06-07 DIAGNOSIS — H353132 Nonexudative age-related macular degeneration, bilateral, intermediate dry stage: Secondary | ICD-10-CM | POA: Diagnosis not present

## 2016-06-09 DIAGNOSIS — M1712 Unilateral primary osteoarthritis, left knee: Secondary | ICD-10-CM | POA: Diagnosis not present

## 2016-06-11 DIAGNOSIS — M1712 Unilateral primary osteoarthritis, left knee: Secondary | ICD-10-CM | POA: Diagnosis not present

## 2016-06-11 DIAGNOSIS — M25562 Pain in left knee: Secondary | ICD-10-CM | POA: Diagnosis not present

## 2016-06-11 DIAGNOSIS — G894 Chronic pain syndrome: Secondary | ICD-10-CM | POA: Diagnosis not present

## 2016-06-11 DIAGNOSIS — M961 Postlaminectomy syndrome, not elsewhere classified: Secondary | ICD-10-CM | POA: Diagnosis not present

## 2016-06-11 DIAGNOSIS — Z79891 Long term (current) use of opiate analgesic: Secondary | ICD-10-CM | POA: Diagnosis not present

## 2016-06-14 DIAGNOSIS — R5383 Other fatigue: Secondary | ICD-10-CM | POA: Diagnosis not present

## 2016-06-14 DIAGNOSIS — M1712 Unilateral primary osteoarthritis, left knee: Secondary | ICD-10-CM | POA: Diagnosis not present

## 2016-06-14 DIAGNOSIS — D649 Anemia, unspecified: Secondary | ICD-10-CM | POA: Diagnosis not present

## 2016-06-14 DIAGNOSIS — G4733 Obstructive sleep apnea (adult) (pediatric): Secondary | ICD-10-CM | POA: Diagnosis not present

## 2016-06-14 DIAGNOSIS — I10 Essential (primary) hypertension: Secondary | ICD-10-CM | POA: Diagnosis not present

## 2016-06-14 DIAGNOSIS — E038 Other specified hypothyroidism: Secondary | ICD-10-CM | POA: Diagnosis not present

## 2016-06-14 DIAGNOSIS — N183 Chronic kidney disease, stage 3 (moderate): Secondary | ICD-10-CM | POA: Diagnosis not present

## 2016-06-14 DIAGNOSIS — Z6832 Body mass index (BMI) 32.0-32.9, adult: Secondary | ICD-10-CM | POA: Diagnosis not present

## 2016-06-16 DIAGNOSIS — M1712 Unilateral primary osteoarthritis, left knee: Secondary | ICD-10-CM | POA: Diagnosis not present

## 2016-06-18 DIAGNOSIS — M1712 Unilateral primary osteoarthritis, left knee: Secondary | ICD-10-CM | POA: Diagnosis not present

## 2016-06-21 DIAGNOSIS — M1712 Unilateral primary osteoarthritis, left knee: Secondary | ICD-10-CM | POA: Diagnosis not present

## 2016-06-23 DIAGNOSIS — M1712 Unilateral primary osteoarthritis, left knee: Secondary | ICD-10-CM | POA: Diagnosis not present

## 2016-06-24 DIAGNOSIS — Z96652 Presence of left artificial knee joint: Secondary | ICD-10-CM | POA: Diagnosis not present

## 2016-06-24 DIAGNOSIS — Z471 Aftercare following joint replacement surgery: Secondary | ICD-10-CM | POA: Diagnosis not present

## 2016-06-25 DIAGNOSIS — M1712 Unilateral primary osteoarthritis, left knee: Secondary | ICD-10-CM | POA: Diagnosis not present

## 2016-06-29 DIAGNOSIS — M1712 Unilateral primary osteoarthritis, left knee: Secondary | ICD-10-CM | POA: Diagnosis not present

## 2016-07-01 DIAGNOSIS — M1712 Unilateral primary osteoarthritis, left knee: Secondary | ICD-10-CM | POA: Diagnosis not present

## 2016-07-06 DIAGNOSIS — M1712 Unilateral primary osteoarthritis, left knee: Secondary | ICD-10-CM | POA: Diagnosis not present

## 2016-07-08 DIAGNOSIS — M1712 Unilateral primary osteoarthritis, left knee: Secondary | ICD-10-CM | POA: Diagnosis not present

## 2016-07-12 DIAGNOSIS — M25562 Pain in left knee: Secondary | ICD-10-CM | POA: Diagnosis not present

## 2016-07-12 DIAGNOSIS — Z79891 Long term (current) use of opiate analgesic: Secondary | ICD-10-CM | POA: Diagnosis not present

## 2016-07-12 DIAGNOSIS — M961 Postlaminectomy syndrome, not elsewhere classified: Secondary | ICD-10-CM | POA: Diagnosis not present

## 2016-07-12 DIAGNOSIS — G894 Chronic pain syndrome: Secondary | ICD-10-CM | POA: Diagnosis not present

## 2016-07-29 DIAGNOSIS — Z471 Aftercare following joint replacement surgery: Secondary | ICD-10-CM | POA: Diagnosis not present

## 2016-07-29 DIAGNOSIS — Z96651 Presence of right artificial knee joint: Secondary | ICD-10-CM | POA: Diagnosis not present

## 2016-08-03 DIAGNOSIS — Z6831 Body mass index (BMI) 31.0-31.9, adult: Secondary | ICD-10-CM | POA: Diagnosis not present

## 2016-08-03 DIAGNOSIS — M545 Low back pain: Secondary | ICD-10-CM | POA: Diagnosis not present

## 2016-08-04 DIAGNOSIS — M25511 Pain in right shoulder: Secondary | ICD-10-CM | POA: Diagnosis not present

## 2016-09-06 DIAGNOSIS — M25562 Pain in left knee: Secondary | ICD-10-CM | POA: Diagnosis not present

## 2016-09-06 DIAGNOSIS — M961 Postlaminectomy syndrome, not elsewhere classified: Secondary | ICD-10-CM | POA: Diagnosis not present

## 2016-09-06 DIAGNOSIS — G894 Chronic pain syndrome: Secondary | ICD-10-CM | POA: Diagnosis not present

## 2016-09-06 DIAGNOSIS — Z79891 Long term (current) use of opiate analgesic: Secondary | ICD-10-CM | POA: Diagnosis not present

## 2016-09-16 DIAGNOSIS — H353212 Exudative age-related macular degeneration, right eye, with inactive choroidal neovascularization: Secondary | ICD-10-CM | POA: Diagnosis not present

## 2016-10-07 ENCOUNTER — Other Ambulatory Visit: Payer: Self-pay | Admitting: Obstetrics & Gynecology

## 2016-10-07 DIAGNOSIS — Z1231 Encounter for screening mammogram for malignant neoplasm of breast: Secondary | ICD-10-CM

## 2016-10-12 DIAGNOSIS — Z23 Encounter for immunization: Secondary | ICD-10-CM | POA: Diagnosis not present

## 2016-10-18 DIAGNOSIS — M25562 Pain in left knee: Secondary | ICD-10-CM | POA: Diagnosis not present

## 2016-10-18 DIAGNOSIS — M961 Postlaminectomy syndrome, not elsewhere classified: Secondary | ICD-10-CM | POA: Diagnosis not present

## 2016-10-18 DIAGNOSIS — Z79891 Long term (current) use of opiate analgesic: Secondary | ICD-10-CM | POA: Diagnosis not present

## 2016-10-18 DIAGNOSIS — G894 Chronic pain syndrome: Secondary | ICD-10-CM | POA: Diagnosis not present

## 2016-11-08 ENCOUNTER — Ambulatory Visit
Admission: RE | Admit: 2016-11-08 | Discharge: 2016-11-08 | Disposition: A | Discharge status: Home / Self Care | Payer: Medicare Other | Source: Ambulatory Visit | Attending: Obstetrics & Gynecology | Admitting: Obstetrics & Gynecology

## 2016-11-08 DIAGNOSIS — Z1231 Encounter for screening mammogram for malignant neoplasm of breast: Secondary | ICD-10-CM

## 2016-12-13 DIAGNOSIS — G894 Chronic pain syndrome: Secondary | ICD-10-CM | POA: Diagnosis not present

## 2016-12-13 DIAGNOSIS — M961 Postlaminectomy syndrome, not elsewhere classified: Secondary | ICD-10-CM | POA: Diagnosis not present

## 2016-12-13 DIAGNOSIS — M25562 Pain in left knee: Secondary | ICD-10-CM | POA: Diagnosis not present

## 2016-12-13 DIAGNOSIS — Z79891 Long term (current) use of opiate analgesic: Secondary | ICD-10-CM | POA: Diagnosis not present

## 2017-01-03 NOTE — Progress Notes (Signed)
76 y.o. G59P2 MarriedCaucasianF here for annual exam.  Doing well.  Has trip to [place] planned in September.  Had right knee replaced 11/16 and then left knee redone 6/17.  Thinks she should have waited to do the left knee.  Reports right knee is really doing well.  Feels like her balance is not as good now.  Using a cane.    Reports her bladder control is much better.  Reports she has more issues when she takes her Lasix or when she is having increased back pain.    PCP:  Dr. Sharlett Iles.  Has appt next week.    Patient's last menstrual period was 12/06/2000.          Sexually active: Yes.    The current method of family planning is status post hysterectomy.    Exercising: Yes.    walking Smoker:  no  Health Maintenance: Pap:  06/07/13 negative  History of abnormal Pap:  no MMG:  11/08/16 BIRADS 1 negative  Colonoscopy:  04/30/15 repeat 10 years  BMD:   2016 with Dr. Sharlett Iles  TDaP:  PCP  Pneumonia vaccine(s):  PCP  Zostavax:   never Hep C testing: not indicated  Screening Labs: PCP, Hb today: PCP, Urine today: PCP   reports that she has never smoked. She has never used smokeless tobacco. She reports that she drinks about 0.6 oz of alcohol per week . She reports that she does not use drugs.  Past Medical History:  Diagnosis Date  . Acquired left flat foot   . Acquired leg length discrepancy   . Allergic urticaria   . Allergy   . Anemia    took iron for a while  . Arthritis    lower back, knees  . Blood transfusion without reported diagnosis   . Bronchitis    history only  . Cataract   . Chronic fatigue syndrome   . Chronic pain    lower back  . Contusion of left hand   . Female bladder prolapse   . Fracture of metatarsal bone    w nonunion   . Hallux rigidus   . History of measles   . History of mumps   . History of rubella   . Hyperlipidemia   . Hypertension   . Hypothyroidism   . Imbalance   . Macular degeneration    wet- right eye  dry- left eye  . Mechanical  loosening of prosthetic knee (HCC)   . Menopause   . Metatarsalgia of left foot   . Missed ab    x 6 - 2 surgery, 4 resolved on it's own  . Neuromuscular disorder ([place])    , left foot has some numbeness- uses cane  . Numbness    outer edge of heel left foot   . Osteopenia   . Pneumonia   . PONV (postoperative nausea and vomiting)    states she had scop patch in past,states she sets off alarms when she is waking up, she "forgets to breathe"   . Seasonal allergies   . Shingles   . Sinus infection    recent sinus infection, finished Zpak- 08/16/2013  . SVD (spontaneous vaginal delivery)    x 2  . Tinnitus   . Trigger middle finger of left hand   . Ulcer ([place])    hx  . Urinary incontinence   . Urinary tract infection   . Virus not detected 2010   postop, caught virus in hosp., respiratory symptoms, upon  d/c to home, had allergic reaction /w plate size hives - not sure of the cause      Past Surgical History:  Procedure Laterality Date  . ABDOMINAL HYSTERECTOMY Bilateral 10/07/2014   Procedure: TOTAL ABDOMINAL HYSTERECTOMY WITH BILATERAL SALPINGO OOPHORECTOMY AND HALBANS CULDOELASTY ;  Surgeon: Lyman Speller, MD;  Location: [facility];  Service: Gynecology;  Laterality: Bilateral;  Tashera Montalvo primary/Silva assist  4 hours total  . ANTERIOR AND POSTERIOR REPAIR N/A 10/07/2014   Procedure: ANTERIOR (CYSTOCELE) AND POSTERIOR REPAIR (RECTOCELE);  Surgeon: Lyman Speller, MD;  Location: [facility];  Service: Gynecology;  Laterality: N/A;  . APPENDECTOMY    . BACK SURGERY  09,00,10,99, 6/14,9/14   x6 surgeries on back -Spinal Fusin L4-L5, Lumbar Fusion L1-S1  . BLADDER SUSPENSION N/A 10/07/2014   Procedure: TRANSVAGINAL TAPE (TVT) EXACT PROCEDURE;  Surgeon: Lyman Speller, MD;  Location: [facility];  Service: Gynecology;  Laterality: N/A;  . BREAST SURGERY     left cyst- benign  . BUNIONECTOMY  03/02/11 and 11/04/11   left foot x2  . CARPAL TUNNEL RELEASE     both  . CYSTOSCOPY N/A  10/07/2014   Procedure: CYSTOSCOPY;  Surgeon: Lyman Speller, MD;  Location: [facility];  Service: Gynecology;  Laterality: N/A;  . DILATION AND CURETTAGE OF UTERUS     x2  . HAMMER TOE SURGERY  4/12   lt foot-2-3  . I&D and spina abscess L4-5    . JOINT REPLACEMENT     R- TKA- 2013  . KNEE ARTHROSCOPY  2007   both  . LUMBAR LAMINECTOMY  05/1999   L4-L5  . METATARSAL OSTEOTOMY  3/12   lt  . ORIF TOE FRACTURE  11/04/2011   Procedure: OPEN REDUCTION INTERNAL FIXATION (ORIF) METATARSAL (TOE) FRACTURE;  Surgeon: Wylene Simmer, MD;  Location: [facility];  Service: Orthopedics;  Laterality: Left;  left revision orif 1st metatarsal with autograft from left calcaneous and left 2nd-3rd metatarsal weil osteotomy  . osteoma  12   forehead x2  . OTHER SURGICAL HISTORY  05/08/13   hardware removed from left foot  . removal of two osteomas     on forehead-2008  . STERIOD INJECTION Left 11/04/2015   Procedure: STEROID INJECTION;  Surgeon: Gaynelle Arabian, MD;  Location: [facility];  Service: Orthopedics;  Laterality: Left;  . THUMB FUSION  2008   rt  . TOTAL KNEE ARTHROPLASTY  08/29/2012   Procedure: TOTAL KNEE ARTHROPLASTY;  Surgeon: Hessie Dibble, MD;  Location: [facility];  Service: Orthopedics;  Laterality: Right;  . TOTAL KNEE ARTHROPLASTY Left 05/17/2016   Procedure: LEFT TOTAL KNEE ARTHROPLASTY;  Surgeon: Gaynelle Arabian, MD;  [facility];  Service: Orthopedics;  Laterality: Left;  . TOTAL KNEE REVISION Right 11/04/2015   Procedure: TOTAL KNEE ARTHROPLASTY REVISION;  Surgeon: Gaynelle Arabian, MD;  [facility];  Service: Orthopedics;  Laterality: Right;  . TOTAL SHOULDER ARTHROPLASTY Right 05/09/2014   Procedure: TOTAL SHOULDER ARTHROPLASTY;  Surgeon: Nita Sells, MD;  Location: [facility];  Service: Orthopedics;  Laterality: Right;  Right shoulder arthroplasty  . TUBAL LIGATION      Current Outpatient Prescriptions  Medication Sig Dispense Refill  . Ascorbic Acid (VITAMIN C  PO) Take 1,000 mg by mouth daily.    . ASPIRIN PO Take 81 mg by mouth daily.    Marland Kitchen atorvastatin (LIPITOR) 20 MG tablet Take 20 mg by mouth at bedtime.     Marland Kitchen BIOTIN PO Take 5,000 mg by  mouth 2 (two) times a week.    . Calcium-Magnesium-Vitamin D (CALCIUM 1200+D3 PO) Take 1 tablet by mouth 2 (two) times daily.    . carvedilol (COREG) 6.25 MG tablet Take 6.25 mg by mouth 2 (two) times daily with a meal.     . diazepam (VALIUM) 5 MG tablet Take 5 mg by mouth every 6 (six) hours as needed (For back spasms.).     Marland Kitchen EPINEPHrine (EPIPEN IJ) Inject as directed as needed.    . Flaxseed, Linseed, (FLAX SEEDS PO) Take 2,000 mg by mouth daily.    . fluticasone (FLONASE) 50 MCG/ACT nasal spray Place 2 sprays into both nostrils daily as needed for allergies.    . furosemide (LASIX) 40 MG tablet Take 40 mg by mouth daily before supper.     . gabapentin (NEURONTIN) 600 MG tablet Take 600 mg by mouth 3 (three) times daily.     Marland Kitchen HYDROmorphone (DILAUDID) 2 MG tablet   0  . loratadine (KLS ALLERCLEAR) 10 MG tablet Take 10 mg by mouth daily as needed for allergies.    . methadone (DOLOPHINE) 5 MG tablet Take 5 mg by mouth every 8 (eight) hours.     . Multiple Vitamins-Minerals (MULTIVITAMIN PO) Take by mouth daily.    . Multiple Vitamins-Minerals (PRESERVISION AREDS PO) Take by mouth.    . senna (SENOKOT) 8.6 MG tablet Take 2-3 tablets by mouth 2 (two) times daily. Take two tablets in the morning and three tablets in the evening.    Marland Kitchen SYNTHROID 75 MCG tablet   4   No current facility-administered medications for this visit.     Family History  Problem Relation Age of Onset  . Hyperlipidemia Father   . Hypertension Father   . Heart attack Father 47    deceased  . Heart attack Brother 90  . Colon cancer Neg Hx   . Esophageal cancer Neg Hx   . Stomach cancer Neg Hx   . Rectal cancer Neg Hx     ROS:  Pertinent items are noted in HPI.  Otherwise, a comprehensive ROS was negative.  Exam:   BP 118/72 (BP  Location: Right Arm, Patient Position: Sitting, Cuff Size: Normal)   Pulse 76   Resp 14   Ht 5\' 2"  (1.575 m)   Wt 184 lb (83.5 kg)   LMP 12/06/2000   BMI 33.65 kg/m   Weight change: -5#  Height: 5\' 2"  (157.5 cm)  Ht Readings from Last 3 Encounters:  01/04/17 5\' 2"  (1.575 m)  05/17/16 5\' 3"  (1.6 m)  05/05/16 5\' 3"  (1.6 m)    General appearance: alert, cooperative and appears stated age Head: Normocephalic, without obvious abnormality, atraumatic Neck: no adenopathy, supple, symmetrical, trachea midline and thyroid normal to inspection and palpation Lungs: clear to auscultation bilaterally Breasts: normal appearance, no masses or tenderness Heart: regular rate and rhythm Abdomen: soft, non-tender; bowel sounds normal; no masses,  no organomegaly Extremities: extremities normal, atraumatic, no cyanosis or edema Skin: Skin color, texture, turgor normal. No rashes or lesions Lymph nodes: Cervical, supraclavicular, and axillary nodes normal. No abnormal inguinal nodes palpated Neurologic: Grossly normal   Pelvic: External genitalia:  no lesions              Urethra:  normal appearing urethra with no masses, tenderness or lesions              Bartholins and Skenes: normal  Vagina: normal appearing vagina with normal color and discharge, no lesions, second degree cystocele noted              Cervix: absent              Pap taken: No. Bimanual Exam:  Uterus:  uterus absent              Adnexa: no mass, fullness, tenderness               Rectovaginal: Confirms               Anus:  normal sphincter tone, no lesions  Chaperone was present for exam.  A:  Well Woman with normal exam  Obesity Hypertension  Elevated lipids  Chronic back pain with history of multiple surgeries, on methadone and diluadid H/O TAH/BSO, Halbans culdoplasty, A+P repair, TVT 11/15  P:  Mammogram guidelines reviewed.  (H/O breast fat necrosis but now back to routine screening) No pap obtained.   Not indicated due to h/o TAH/BSO Labs/vaccines with Dr. Sharlett Iles  Pt desires to continue being seen so will return in two years.

## 2017-01-04 ENCOUNTER — Ambulatory Visit (INDEPENDENT_AMBULATORY_CARE_PROVIDER_SITE_OTHER): Payer: Medicare Other | Admitting: Obstetrics & Gynecology

## 2017-01-04 ENCOUNTER — Encounter: Payer: Self-pay | Admitting: Obstetrics & Gynecology

## 2017-01-04 VITALS — BP 118/72 | HR 76 | Resp 14 | Ht 62.0 in | Wt 184.0 lb

## 2017-01-04 DIAGNOSIS — Z01419 Encounter for gynecological examination (general) (routine) without abnormal findings: Secondary | ICD-10-CM

## 2017-01-04 DIAGNOSIS — Z124 Encounter for screening for malignant neoplasm of cervix: Secondary | ICD-10-CM | POA: Diagnosis not present

## 2017-01-10 DIAGNOSIS — E038 Other specified hypothyroidism: Secondary | ICD-10-CM | POA: Diagnosis not present

## 2017-01-10 DIAGNOSIS — R8299 Other abnormal findings in urine: Secondary | ICD-10-CM | POA: Diagnosis not present

## 2017-01-10 DIAGNOSIS — I10 Essential (primary) hypertension: Secondary | ICD-10-CM | POA: Diagnosis not present

## 2017-01-10 DIAGNOSIS — E784 Other hyperlipidemia: Secondary | ICD-10-CM | POA: Diagnosis not present

## 2017-01-11 DIAGNOSIS — E038 Other specified hypothyroidism: Secondary | ICD-10-CM | POA: Diagnosis not present

## 2017-01-17 DIAGNOSIS — Z1389 Encounter for screening for other disorder: Secondary | ICD-10-CM | POA: Diagnosis not present

## 2017-01-17 DIAGNOSIS — M545 Low back pain: Secondary | ICD-10-CM | POA: Diagnosis not present

## 2017-01-17 DIAGNOSIS — E784 Other hyperlipidemia: Secondary | ICD-10-CM | POA: Diagnosis not present

## 2017-01-17 DIAGNOSIS — G4733 Obstructive sleep apnea (adult) (pediatric): Secondary | ICD-10-CM | POA: Diagnosis not present

## 2017-01-17 DIAGNOSIS — Z0000 Encounter for general adult medical examination without abnormal findings: Secondary | ICD-10-CM | POA: Diagnosis not present

## 2017-01-17 DIAGNOSIS — I10 Essential (primary) hypertension: Secondary | ICD-10-CM | POA: Diagnosis not present

## 2017-01-17 DIAGNOSIS — D6489 Other specified anemias: Secondary | ICD-10-CM | POA: Diagnosis not present

## 2017-01-17 DIAGNOSIS — M25562 Pain in left knee: Secondary | ICD-10-CM | POA: Diagnosis not present

## 2017-01-17 DIAGNOSIS — Z6833 Body mass index (BMI) 33.0-33.9, adult: Secondary | ICD-10-CM | POA: Diagnosis not present

## 2017-01-17 DIAGNOSIS — M2140 Flat foot [pes planus] (acquired), unspecified foot: Secondary | ICD-10-CM | POA: Diagnosis not present

## 2017-01-17 DIAGNOSIS — E038 Other specified hypothyroidism: Secondary | ICD-10-CM | POA: Diagnosis not present

## 2017-01-26 DIAGNOSIS — Z1212 Encounter for screening for malignant neoplasm of rectum: Secondary | ICD-10-CM | POA: Diagnosis not present

## 2017-02-01 DIAGNOSIS — M545 Low back pain: Secondary | ICD-10-CM | POA: Diagnosis not present

## 2017-02-01 DIAGNOSIS — Z6832 Body mass index (BMI) 32.0-32.9, adult: Secondary | ICD-10-CM | POA: Diagnosis not present

## 2017-02-01 DIAGNOSIS — R030 Elevated blood-pressure reading, without diagnosis of hypertension: Secondary | ICD-10-CM | POA: Diagnosis not present

## 2017-02-14 DIAGNOSIS — M961 Postlaminectomy syndrome, not elsewhere classified: Secondary | ICD-10-CM | POA: Diagnosis not present

## 2017-02-14 DIAGNOSIS — G894 Chronic pain syndrome: Secondary | ICD-10-CM | POA: Diagnosis not present

## 2017-02-14 DIAGNOSIS — Z79891 Long term (current) use of opiate analgesic: Secondary | ICD-10-CM | POA: Diagnosis not present

## 2017-02-14 DIAGNOSIS — M25562 Pain in left knee: Secondary | ICD-10-CM | POA: Diagnosis not present

## 2017-03-23 DIAGNOSIS — M722 Plantar fascial fibromatosis: Secondary | ICD-10-CM | POA: Diagnosis not present

## 2017-03-23 DIAGNOSIS — M76822 Posterior tibial tendinitis, left leg: Secondary | ICD-10-CM | POA: Diagnosis not present

## 2017-04-01 DIAGNOSIS — H35422 Microcystoid degeneration of retina, left eye: Secondary | ICD-10-CM | POA: Diagnosis not present

## 2017-04-01 DIAGNOSIS — H35433 Paving stone degeneration of retina, bilateral: Secondary | ICD-10-CM | POA: Diagnosis not present

## 2017-04-01 DIAGNOSIS — H353212 Exudative age-related macular degeneration, right eye, with inactive choroidal neovascularization: Secondary | ICD-10-CM | POA: Diagnosis not present

## 2017-04-01 DIAGNOSIS — H353122 Nonexudative age-related macular degeneration, left eye, intermediate dry stage: Secondary | ICD-10-CM | POA: Diagnosis not present

## 2017-04-11 DIAGNOSIS — M961 Postlaminectomy syndrome, not elsewhere classified: Secondary | ICD-10-CM | POA: Diagnosis not present

## 2017-04-11 DIAGNOSIS — Z79891 Long term (current) use of opiate analgesic: Secondary | ICD-10-CM | POA: Diagnosis not present

## 2017-04-11 DIAGNOSIS — G894 Chronic pain syndrome: Secondary | ICD-10-CM | POA: Diagnosis not present

## 2017-04-11 DIAGNOSIS — M25562 Pain in left knee: Secondary | ICD-10-CM | POA: Diagnosis not present

## 2017-05-12 DIAGNOSIS — M25561 Pain in right knee: Secondary | ICD-10-CM | POA: Diagnosis not present

## 2017-05-12 DIAGNOSIS — Z96652 Presence of left artificial knee joint: Secondary | ICD-10-CM | POA: Diagnosis not present

## 2017-05-12 DIAGNOSIS — M25562 Pain in left knee: Secondary | ICD-10-CM | POA: Diagnosis not present

## 2017-05-12 DIAGNOSIS — Z471 Aftercare following joint replacement surgery: Secondary | ICD-10-CM | POA: Diagnosis not present

## 2017-05-12 DIAGNOSIS — Z96651 Presence of right artificial knee joint: Secondary | ICD-10-CM | POA: Diagnosis not present

## 2017-05-12 DIAGNOSIS — G8929 Other chronic pain: Secondary | ICD-10-CM | POA: Diagnosis not present

## 2017-05-12 DIAGNOSIS — Z96653 Presence of artificial knee joint, bilateral: Secondary | ICD-10-CM | POA: Diagnosis not present

## 2017-05-20 DIAGNOSIS — M722 Plantar fascial fibromatosis: Secondary | ICD-10-CM | POA: Diagnosis not present

## 2017-05-20 DIAGNOSIS — M76822 Posterior tibial tendinitis, left leg: Secondary | ICD-10-CM | POA: Diagnosis not present

## 2017-05-31 DIAGNOSIS — M67862 Other specified disorders of synovium, left knee: Secondary | ICD-10-CM | POA: Diagnosis not present

## 2017-05-31 DIAGNOSIS — M659 Synovitis and tenosynovitis, unspecified: Secondary | ICD-10-CM | POA: Diagnosis not present

## 2017-05-31 DIAGNOSIS — G8918 Other acute postprocedural pain: Secondary | ICD-10-CM | POA: Diagnosis not present

## 2017-05-31 DIAGNOSIS — M25861 Other specified joint disorders, right knee: Secondary | ICD-10-CM | POA: Diagnosis not present

## 2017-05-31 DIAGNOSIS — Z96653 Presence of artificial knee joint, bilateral: Secondary | ICD-10-CM | POA: Diagnosis not present

## 2017-05-31 DIAGNOSIS — M25862 Other specified joint disorders, left knee: Secondary | ICD-10-CM | POA: Diagnosis not present

## 2017-05-31 DIAGNOSIS — M67861 Other specified disorders of synovium, right knee: Secondary | ICD-10-CM | POA: Diagnosis not present

## 2017-06-06 DIAGNOSIS — G894 Chronic pain syndrome: Secondary | ICD-10-CM | POA: Diagnosis not present

## 2017-06-06 DIAGNOSIS — Z79891 Long term (current) use of opiate analgesic: Secondary | ICD-10-CM | POA: Diagnosis not present

## 2017-06-06 DIAGNOSIS — M25561 Pain in right knee: Secondary | ICD-10-CM | POA: Diagnosis not present

## 2017-06-06 DIAGNOSIS — M961 Postlaminectomy syndrome, not elsewhere classified: Secondary | ICD-10-CM | POA: Diagnosis not present

## 2017-06-06 DIAGNOSIS — M25562 Pain in left knee: Secondary | ICD-10-CM | POA: Diagnosis not present

## 2017-06-07 DIAGNOSIS — M25861 Other specified joint disorders, right knee: Secondary | ICD-10-CM | POA: Diagnosis not present

## 2017-06-07 DIAGNOSIS — H01029 Squamous blepharitis unspecified eye, unspecified eyelid: Secondary | ICD-10-CM | POA: Diagnosis not present

## 2017-06-07 DIAGNOSIS — H2513 Age-related nuclear cataract, bilateral: Secondary | ICD-10-CM | POA: Diagnosis not present

## 2017-06-07 DIAGNOSIS — H353132 Nonexudative age-related macular degeneration, bilateral, intermediate dry stage: Secondary | ICD-10-CM | POA: Diagnosis not present

## 2017-06-07 DIAGNOSIS — M25862 Other specified joint disorders, left knee: Secondary | ICD-10-CM | POA: Diagnosis not present

## 2017-06-13 DIAGNOSIS — Z7409 Other reduced mobility: Secondary | ICD-10-CM | POA: Diagnosis not present

## 2017-06-15 DIAGNOSIS — Z7409 Other reduced mobility: Secondary | ICD-10-CM | POA: Diagnosis not present

## 2017-06-21 DIAGNOSIS — Z7409 Other reduced mobility: Secondary | ICD-10-CM | POA: Diagnosis not present

## 2017-06-23 DIAGNOSIS — Z7409 Other reduced mobility: Secondary | ICD-10-CM | POA: Diagnosis not present

## 2017-06-28 DIAGNOSIS — T8482XD Fibrosis due to internal orthopedic prosthetic devices, implants and grafts, subsequent encounter: Secondary | ICD-10-CM | POA: Diagnosis not present

## 2017-06-30 DIAGNOSIS — Z7409 Other reduced mobility: Secondary | ICD-10-CM | POA: Diagnosis not present

## 2017-07-04 DIAGNOSIS — Z7409 Other reduced mobility: Secondary | ICD-10-CM | POA: Diagnosis not present

## 2017-07-07 DIAGNOSIS — Z7409 Other reduced mobility: Secondary | ICD-10-CM | POA: Diagnosis not present

## 2017-07-11 DIAGNOSIS — Z7409 Other reduced mobility: Secondary | ICD-10-CM | POA: Diagnosis not present

## 2017-07-14 DIAGNOSIS — Z7409 Other reduced mobility: Secondary | ICD-10-CM | POA: Diagnosis not present

## 2017-08-01 DIAGNOSIS — G894 Chronic pain syndrome: Secondary | ICD-10-CM | POA: Diagnosis not present

## 2017-08-01 DIAGNOSIS — M25562 Pain in left knee: Secondary | ICD-10-CM | POA: Diagnosis not present

## 2017-08-01 DIAGNOSIS — M961 Postlaminectomy syndrome, not elsewhere classified: Secondary | ICD-10-CM | POA: Diagnosis not present

## 2017-08-01 DIAGNOSIS — Z79891 Long term (current) use of opiate analgesic: Secondary | ICD-10-CM | POA: Diagnosis not present

## 2017-08-04 DIAGNOSIS — Z6832 Body mass index (BMI) 32.0-32.9, adult: Secondary | ICD-10-CM | POA: Diagnosis not present

## 2017-08-04 DIAGNOSIS — M545 Low back pain: Secondary | ICD-10-CM | POA: Diagnosis not present

## 2017-08-04 DIAGNOSIS — R030 Elevated blood-pressure reading, without diagnosis of hypertension: Secondary | ICD-10-CM | POA: Diagnosis not present

## 2017-08-11 DIAGNOSIS — T8482XD Fibrosis due to internal orthopedic prosthetic devices, implants and grafts, subsequent encounter: Secondary | ICD-10-CM | POA: Diagnosis not present

## 2017-08-23 DIAGNOSIS — L821 Other seborrheic keratosis: Secondary | ICD-10-CM | POA: Diagnosis not present

## 2017-09-13 DIAGNOSIS — Z79891 Long term (current) use of opiate analgesic: Secondary | ICD-10-CM | POA: Diagnosis not present

## 2017-09-13 DIAGNOSIS — M961 Postlaminectomy syndrome, not elsewhere classified: Secondary | ICD-10-CM | POA: Diagnosis not present

## 2017-09-13 DIAGNOSIS — M25562 Pain in left knee: Secondary | ICD-10-CM | POA: Diagnosis not present

## 2017-09-13 DIAGNOSIS — G894 Chronic pain syndrome: Secondary | ICD-10-CM | POA: Diagnosis not present

## 2017-09-20 DIAGNOSIS — Z23 Encounter for immunization: Secondary | ICD-10-CM | POA: Diagnosis not present

## 2017-09-23 DIAGNOSIS — Z471 Aftercare following joint replacement surgery: Secondary | ICD-10-CM | POA: Diagnosis not present

## 2017-09-23 DIAGNOSIS — Z4789 Encounter for other orthopedic aftercare: Secondary | ICD-10-CM | POA: Diagnosis not present

## 2017-09-23 DIAGNOSIS — Z96653 Presence of artificial knee joint, bilateral: Secondary | ICD-10-CM | POA: Diagnosis not present

## 2017-10-07 DIAGNOSIS — H353212 Exudative age-related macular degeneration, right eye, with inactive choroidal neovascularization: Secondary | ICD-10-CM | POA: Diagnosis not present

## 2017-11-08 DIAGNOSIS — M961 Postlaminectomy syndrome, not elsewhere classified: Secondary | ICD-10-CM | POA: Diagnosis not present

## 2017-11-08 DIAGNOSIS — G894 Chronic pain syndrome: Secondary | ICD-10-CM | POA: Diagnosis not present

## 2017-11-08 DIAGNOSIS — Z79891 Long term (current) use of opiate analgesic: Secondary | ICD-10-CM | POA: Diagnosis not present

## 2017-11-08 DIAGNOSIS — M25562 Pain in left knee: Secondary | ICD-10-CM | POA: Diagnosis not present

## 2017-12-07 DIAGNOSIS — H353212 Exudative age-related macular degeneration, right eye, with inactive choroidal neovascularization: Secondary | ICD-10-CM | POA: Diagnosis not present

## 2017-12-07 DIAGNOSIS — H353221 Exudative age-related macular degeneration, left eye, with active choroidal neovascularization: Secondary | ICD-10-CM | POA: Diagnosis not present

## 2017-12-07 DIAGNOSIS — H35422 Microcystoid degeneration of retina, left eye: Secondary | ICD-10-CM | POA: Diagnosis not present

## 2017-12-07 DIAGNOSIS — H43813 Vitreous degeneration, bilateral: Secondary | ICD-10-CM | POA: Diagnosis not present

## 2017-12-19 DIAGNOSIS — M25562 Pain in left knee: Secondary | ICD-10-CM | POA: Diagnosis not present

## 2017-12-19 DIAGNOSIS — Z79891 Long term (current) use of opiate analgesic: Secondary | ICD-10-CM | POA: Diagnosis not present

## 2017-12-19 DIAGNOSIS — M961 Postlaminectomy syndrome, not elsewhere classified: Secondary | ICD-10-CM | POA: Diagnosis not present

## 2017-12-19 DIAGNOSIS — G894 Chronic pain syndrome: Secondary | ICD-10-CM | POA: Diagnosis not present

## 2018-01-06 DIAGNOSIS — H35422 Microcystoid degeneration of retina, left eye: Secondary | ICD-10-CM | POA: Diagnosis not present

## 2018-01-06 DIAGNOSIS — H35433 Paving stone degeneration of retina, bilateral: Secondary | ICD-10-CM | POA: Diagnosis not present

## 2018-01-06 DIAGNOSIS — H353212 Exudative age-related macular degeneration, right eye, with inactive choroidal neovascularization: Secondary | ICD-10-CM | POA: Diagnosis not present

## 2018-01-06 DIAGNOSIS — H353221 Exudative age-related macular degeneration, left eye, with active choroidal neovascularization: Secondary | ICD-10-CM | POA: Diagnosis not present

## 2018-01-13 DIAGNOSIS — R82998 Other abnormal findings in urine: Secondary | ICD-10-CM | POA: Diagnosis not present

## 2018-01-13 DIAGNOSIS — E038 Other specified hypothyroidism: Secondary | ICD-10-CM | POA: Diagnosis not present

## 2018-01-13 DIAGNOSIS — I10 Essential (primary) hypertension: Secondary | ICD-10-CM | POA: Diagnosis not present

## 2018-01-13 DIAGNOSIS — E7849 Other hyperlipidemia: Secondary | ICD-10-CM | POA: Diagnosis not present

## 2018-01-20 DIAGNOSIS — Z6834 Body mass index (BMI) 34.0-34.9, adult: Secondary | ICD-10-CM | POA: Diagnosis not present

## 2018-01-20 DIAGNOSIS — Z1389 Encounter for screening for other disorder: Secondary | ICD-10-CM | POA: Diagnosis not present

## 2018-01-20 DIAGNOSIS — M545 Low back pain: Secondary | ICD-10-CM | POA: Diagnosis not present

## 2018-01-20 DIAGNOSIS — I10 Essential (primary) hypertension: Secondary | ICD-10-CM | POA: Diagnosis not present

## 2018-01-20 DIAGNOSIS — E038 Other specified hypothyroidism: Secondary | ICD-10-CM | POA: Diagnosis not present

## 2018-01-20 DIAGNOSIS — G4733 Obstructive sleep apnea (adult) (pediatric): Secondary | ICD-10-CM | POA: Diagnosis not present

## 2018-01-20 DIAGNOSIS — Z0000 Encounter for general adult medical examination without abnormal findings: Secondary | ICD-10-CM | POA: Diagnosis not present

## 2018-01-20 DIAGNOSIS — E7849 Other hyperlipidemia: Secondary | ICD-10-CM | POA: Diagnosis not present

## 2018-01-20 DIAGNOSIS — D6489 Other specified anemias: Secondary | ICD-10-CM | POA: Diagnosis not present

## 2018-01-20 DIAGNOSIS — M25561 Pain in right knee: Secondary | ICD-10-CM | POA: Diagnosis not present

## 2018-01-26 DIAGNOSIS — Z1212 Encounter for screening for malignant neoplasm of rectum: Secondary | ICD-10-CM | POA: Diagnosis not present

## 2018-01-27 ENCOUNTER — Other Ambulatory Visit: Payer: Self-pay | Admitting: Obstetrics & Gynecology

## 2018-01-27 DIAGNOSIS — Z139 Encounter for screening, unspecified: Secondary | ICD-10-CM

## 2018-02-03 DIAGNOSIS — H353221 Exudative age-related macular degeneration, left eye, with active choroidal neovascularization: Secondary | ICD-10-CM | POA: Diagnosis not present

## 2018-02-03 DIAGNOSIS — H35433 Paving stone degeneration of retina, bilateral: Secondary | ICD-10-CM | POA: Diagnosis not present

## 2018-02-03 DIAGNOSIS — H353212 Exudative age-related macular degeneration, right eye, with inactive choroidal neovascularization: Secondary | ICD-10-CM | POA: Diagnosis not present

## 2018-02-03 DIAGNOSIS — H35422 Microcystoid degeneration of retina, left eye: Secondary | ICD-10-CM | POA: Diagnosis not present

## 2018-02-07 DIAGNOSIS — R030 Elevated blood-pressure reading, without diagnosis of hypertension: Secondary | ICD-10-CM | POA: Diagnosis not present

## 2018-02-07 DIAGNOSIS — Z6833 Body mass index (BMI) 33.0-33.9, adult: Secondary | ICD-10-CM | POA: Diagnosis not present

## 2018-02-07 DIAGNOSIS — M545 Low back pain: Secondary | ICD-10-CM | POA: Diagnosis not present

## 2018-02-13 ENCOUNTER — Other Ambulatory Visit (HOSPITAL_COMMUNITY): Payer: Self-pay | Admitting: Surgical

## 2018-02-13 DIAGNOSIS — M25561 Pain in right knee: Secondary | ICD-10-CM | POA: Diagnosis not present

## 2018-02-13 DIAGNOSIS — M25562 Pain in left knee: Secondary | ICD-10-CM | POA: Diagnosis not present

## 2018-02-13 DIAGNOSIS — M961 Postlaminectomy syndrome, not elsewhere classified: Secondary | ICD-10-CM | POA: Diagnosis not present

## 2018-02-13 DIAGNOSIS — G894 Chronic pain syndrome: Secondary | ICD-10-CM | POA: Diagnosis not present

## 2018-02-13 DIAGNOSIS — Z79891 Long term (current) use of opiate analgesic: Secondary | ICD-10-CM | POA: Diagnosis not present

## 2018-02-13 DIAGNOSIS — Z96651 Presence of right artificial knee joint: Secondary | ICD-10-CM | POA: Diagnosis not present

## 2018-02-16 ENCOUNTER — Ambulatory Visit
Admission: RE | Admit: 2018-02-16 | Discharge: 2018-02-16 | Disposition: A | Discharge status: Home / Self Care | Payer: Medicare Other | Source: Ambulatory Visit | Attending: Obstetrics & Gynecology | Admitting: Obstetrics & Gynecology

## 2018-02-16 DIAGNOSIS — Z139 Encounter for screening, unspecified: Secondary | ICD-10-CM

## 2018-02-16 DIAGNOSIS — Z1231 Encounter for screening mammogram for malignant neoplasm of breast: Secondary | ICD-10-CM | POA: Diagnosis not present

## 2018-02-20 ENCOUNTER — Encounter (HOSPITAL_COMMUNITY)
Admission: RE | Admit: 2018-02-20 | Discharge: 2018-02-20 | Disposition: A | Discharge status: Home / Self Care | Payer: Medicare Other | Source: Ambulatory Visit | Attending: Surgical | Admitting: Surgical

## 2018-02-20 DIAGNOSIS — Z471 Aftercare following joint replacement surgery: Secondary | ICD-10-CM | POA: Diagnosis not present

## 2018-02-20 DIAGNOSIS — M25561 Pain in right knee: Secondary | ICD-10-CM | POA: Diagnosis not present

## 2018-02-20 DIAGNOSIS — Z96651 Presence of right artificial knee joint: Secondary | ICD-10-CM | POA: Diagnosis not present

## 2018-02-20 MED ORDER — TECHNETIUM TC 99M MEDRONATE IV KIT
20.1000 | PACK | Freq: Once | INTRAVENOUS | Status: AC | PRN
  Administered 2018-02-20: 20.1 via INTRAVENOUS

## 2018-03-02 DIAGNOSIS — M1711 Unilateral primary osteoarthritis, right knee: Secondary | ICD-10-CM | POA: Diagnosis not present

## 2018-03-05 DIAGNOSIS — Z96651 Presence of right artificial knee joint: Secondary | ICD-10-CM | POA: Insufficient documentation

## 2018-03-15 DIAGNOSIS — H353212 Exudative age-related macular degeneration, right eye, with inactive choroidal neovascularization: Secondary | ICD-10-CM | POA: Diagnosis not present

## 2018-03-15 DIAGNOSIS — H353221 Exudative age-related macular degeneration, left eye, with active choroidal neovascularization: Secondary | ICD-10-CM | POA: Diagnosis not present

## 2018-03-15 DIAGNOSIS — H43813 Vitreous degeneration, bilateral: Secondary | ICD-10-CM | POA: Diagnosis not present

## 2018-04-18 DIAGNOSIS — Z96651 Presence of right artificial knee joint: Secondary | ICD-10-CM | POA: Diagnosis not present

## 2018-04-18 DIAGNOSIS — M65861 Other synovitis and tenosynovitis, right lower leg: Secondary | ICD-10-CM | POA: Diagnosis not present

## 2018-04-18 DIAGNOSIS — M659 Synovitis and tenosynovitis, unspecified: Secondary | ICD-10-CM | POA: Diagnosis not present

## 2018-04-20 DIAGNOSIS — Z79891 Long term (current) use of opiate analgesic: Secondary | ICD-10-CM | POA: Diagnosis not present

## 2018-04-20 DIAGNOSIS — G894 Chronic pain syndrome: Secondary | ICD-10-CM | POA: Diagnosis not present

## 2018-04-20 DIAGNOSIS — M961 Postlaminectomy syndrome, not elsewhere classified: Secondary | ICD-10-CM | POA: Diagnosis not present

## 2018-04-20 DIAGNOSIS — M25561 Pain in right knee: Secondary | ICD-10-CM | POA: Diagnosis not present

## 2018-05-10 DIAGNOSIS — H35433 Paving stone degeneration of retina, bilateral: Secondary | ICD-10-CM | POA: Diagnosis not present

## 2018-05-10 DIAGNOSIS — H353221 Exudative age-related macular degeneration, left eye, with active choroidal neovascularization: Secondary | ICD-10-CM | POA: Diagnosis not present

## 2018-05-10 DIAGNOSIS — H35422 Microcystoid degeneration of retina, left eye: Secondary | ICD-10-CM | POA: Diagnosis not present

## 2018-05-10 DIAGNOSIS — H353212 Exudative age-related macular degeneration, right eye, with inactive choroidal neovascularization: Secondary | ICD-10-CM | POA: Diagnosis not present

## 2018-06-12 DIAGNOSIS — H01029 Squamous blepharitis unspecified eye, unspecified eyelid: Secondary | ICD-10-CM | POA: Diagnosis not present

## 2018-06-12 DIAGNOSIS — H353132 Nonexudative age-related macular degeneration, bilateral, intermediate dry stage: Secondary | ICD-10-CM | POA: Diagnosis not present

## 2018-06-12 DIAGNOSIS — H2513 Age-related nuclear cataract, bilateral: Secondary | ICD-10-CM | POA: Diagnosis not present

## 2018-06-20 DIAGNOSIS — G894 Chronic pain syndrome: Secondary | ICD-10-CM | POA: Diagnosis not present

## 2018-06-20 DIAGNOSIS — M25561 Pain in right knee: Secondary | ICD-10-CM | POA: Diagnosis not present

## 2018-06-20 DIAGNOSIS — M961 Postlaminectomy syndrome, not elsewhere classified: Secondary | ICD-10-CM | POA: Diagnosis not present

## 2018-06-20 DIAGNOSIS — Z79891 Long term (current) use of opiate analgesic: Secondary | ICD-10-CM | POA: Diagnosis not present

## 2018-06-28 ENCOUNTER — Encounter: Payer: Self-pay | Admitting: Obstetrics & Gynecology

## 2018-06-28 ENCOUNTER — Ambulatory Visit (INDEPENDENT_AMBULATORY_CARE_PROVIDER_SITE_OTHER): Payer: Medicare Other | Admitting: Obstetrics & Gynecology

## 2018-06-28 VITALS — BP 104/60 | HR 64 | Resp 16 | Ht 62.5 in | Wt 196.0 lb

## 2018-06-28 DIAGNOSIS — R3915 Urgency of urination: Secondary | ICD-10-CM | POA: Diagnosis not present

## 2018-06-28 DIAGNOSIS — Z01419 Encounter for gynecological examination (general) (routine) without abnormal findings: Secondary | ICD-10-CM | POA: Diagnosis not present

## 2018-06-28 DIAGNOSIS — Z124 Encounter for screening for malignant neoplasm of cervix: Secondary | ICD-10-CM | POA: Diagnosis not present

## 2018-06-28 MED ORDER — NYSTATIN-TRIAMCINOLONE 100000-0.1 UNIT/GM-% EX OINT: 1.0000 "application " | TOPICAL_OINTMENT | Freq: Two times a day (BID) | CUTANEOUS | 1 refills | Status: DC

## 2018-06-28 MED ORDER — EPINEPHRINE 0.3 MG/0.3ML IJ SOAJ: 0.3000 mg | Freq: Once | INTRAMUSCULAR | 1 refills | Status: AC

## 2018-06-28 NOTE — Progress Notes (Signed)
77 y.o. G81P2 MarriedCaucasianF here for annual exam.  Daughter has moved to [place].  Has appt later today.  She is glad her daughter is now in the area.    Doing as well as possible considering her long term back issues.    Denies vaginal bleeding.  Bladder function is "ok".  Having some increased urgency.      PCP:  Dr. Sharlett Iles  Patient's last menstrual period was 12/06/2000.          Sexually active: Yes.    The current method of family planning is status post hysterectomy.    Exercising: Yes.    walking Smoker:  no  Health Maintenance: Pap:  06/07/13 Neg  History of abnormal Pap:  no MMG:  02/17/18 BIRADS1:Neg  Colonoscopy:  04/30/15 polyp BMD:   03/11/10 normal.  Thinks she's had one done in 2016 TDaP:  PCP Pneumonia vaccine(s):  PCP Shingrix:  D/w pt today.  Prescription given. Hep C testing: n/a Screening Labs: PCP   reports that she has never smoked. She has never used smokeless tobacco. She reports that she drinks about 0.6 oz of alcohol per week. She reports that she does not use drugs.  Past Medical History:  Diagnosis Date  . Acquired left flat foot   . Acquired leg length discrepancy   . Allergic urticaria   . Allergy   . Anemia    took iron for a while  . Arthritis    lower back, knees  . Blood transfusion without reported diagnosis   . Bronchitis    history only  . Cataract   . Chronic fatigue syndrome   . Chronic pain    lower back  . Contusion of left hand   . Female bladder prolapse   . Fracture of metatarsal bone    w nonunion   . Hallux rigidus   . History of measles   . History of mumps   . History of rubella   . Hyperlipidemia   . Hypertension   . Hypothyroidism   . Imbalance   . Macular degeneration    wet- right eye  dry- left eye  . 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 Menopause   . Metatarsalgia of left foot   . Missed ab    x 6 - 2 surgery, 4 resolved on it's own  . Neuromuscular disorder ([place])    , left foot has  some numbeness- uses cane  . Numbness    outer edge of heel left foot   . Osteopenia   . Pneumonia   . PONV (postoperative nausea and vomiting)    states she had scop patch in past,states she sets off alarms when she is waking up, she "forgets to breathe"   . Seasonal allergies   . Shingles   . Sinus infection    recent sinus infection, finished Zpak- 08/16/2013  . SVD (spontaneous vaginal delivery)    x 2  . Tinnitus   . Trigger middle finger of left hand   . Ulcer    hx  . Urinary incontinence   . Virus not detected 2010   postop, caught virus in hosp., respiratory symptoms, upon d/c to home, had allergic reaction /w plate size hives - not sure of the cause      Past Surgical History:  Procedure Laterality Date  . ABDOMINAL HYSTERECTOMY Bilateral 10/07/2014   Procedure: TOTAL ABDOMINAL HYSTERECTOMY WITH BILATERAL SALPINGO OOPHORECTOMY AND HALBANS CULDOELASTY ;  Surgeon: Satira Anis  Sabra Heck, MD;  Location: [facility];  Service: Gynecology;  Laterality: Bilateral;  Damira Kem primary/Silva assist  4 hours total  . ANTERIOR AND POSTERIOR REPAIR N/A 10/07/2014   Procedure: ANTERIOR (CYSTOCELE) AND POSTERIOR REPAIR (RECTOCELE);  Surgeon: Lyman Speller, MD;  Location: [facility];  Service: Gynecology;  Laterality: N/A;  . APPENDECTOMY    . BACK SURGERY  09,00,10,99, 6/14,9/14   x6 surgeries on back -Spinal Fusin L4-L5, Lumbar Fusion L1-S1  . BLADDER SUSPENSION N/A 10/07/2014   Procedure: TRANSVAGINAL TAPE (TVT) EXACT PROCEDURE;  Surgeon: Lyman Speller, MD;  Location: [facility];  Service: Gynecology;  Laterality: N/A;  . BREAST EXCISIONAL BIOPSY Left 1998   benign  . BREAST SURGERY     left cyst- benign  . BUNIONECTOMY  03/02/11 and 11/04/11   left foot x2  . CARPAL TUNNEL RELEASE     both  . CYSTOSCOPY N/A 10/07/2014   Procedure: CYSTOSCOPY;  Surgeon: Lyman Speller, MD;  Location: [facility];  Service: Gynecology;  Laterality: N/A;  . DILATION AND CURETTAGE OF UTERUS     x2  .  HAMMER TOE SURGERY  4/12   lt foot-2-3  . I&D and spina abscess L4-5    . JOINT REPLACEMENT     R- TKA- 2013  . KNEE ARTHROSCOPY  2007   both  . LUMBAR LAMINECTOMY  05/1999   L4-L5  . METATARSAL OSTEOTOMY  3/12   lt  . ORIF TOE FRACTURE  11/04/2011   Procedure: OPEN REDUCTION INTERNAL FIXATION (ORIF) METATARSAL (TOE) FRACTURE;  Surgeon: Wylene Simmer, MD;  Location: [facility];  Service: Orthopedics;  Laterality: Left;  left revision orif 1st metatarsal with autograft from left calcaneous and left 2nd-3rd metatarsal weil osteotomy  . osteoma  12   forehead x2  . OTHER SURGICAL HISTORY  05/08/13   hardware removed from left foot  . removal of two osteomas     on forehead-2008  . STERIOD INJECTION Left 11/04/2015   Procedure: STEROID INJECTION;  Surgeon: Gaynelle Arabian, MD;  Location: [facility];  Service: Orthopedics;  Laterality: Left;  . THUMB FUSION  2008   rt  . TOTAL KNEE ARTHROPLASTY  08/29/2012   Procedure: TOTAL KNEE ARTHROPLASTY;  Surgeon: Hessie Dibble, MD;  Location: [facility];  Service: Orthopedics;  Laterality: Right;  . TOTAL KNEE ARTHROPLASTY Left 05/17/2016   Procedure: LEFT TOTAL KNEE ARTHROPLASTY;  Surgeon: Gaynelle Arabian, MD;  Location[facility];  Service: Orthopedics;  Laterality: Left;  . TOTAL KNEE REVISION Right 11/04/2015   Procedure: TOTAL KNEE ARTHROPLASTY REVISION;  Surgeon: Gaynelle Arabian, MD;  [facility];  Service: Orthopedics;  Laterality: Right;  . TOTAL SHOULDER ARTHROPLASTY Right 05/09/2014   Procedure: TOTAL SHOULDER ARTHROPLASTY;  Surgeon: Nita Sells, MD;  Location: [facility];  Service: Orthopedics;  Laterality: Right;  Right shoulder arthroplasty  . TUBAL LIGATION      Current Outpatient Medications  Medication Sig Dispense Refill  . Ascorbic Acid (VITAMIN C PO) Take 1,000 mg by mouth daily.    . ASPIRIN PO Take 81 mg by mouth daily.    Marland Kitchen atorvastatin (LIPITOR) 20 MG tablet Take 20 mg by mouth at bedtime.     Marland Kitchen BIOTIN PO Take 5,000  mg by mouth 2 (two) times a week.    . Calcium-Magnesium-Vitamin D (CALCIUM 1200+D3 PO) Take 1 tablet by mouth 2 (two) times daily.    . carvedilol (COREG) 6.25 MG tablet Take 6.25 mg by mouth 2 (two) times daily  with a meal.     . EPINEPHrine (EPIPEN IJ) Inject as directed as needed.    . Flaxseed, Linseed, (FLAX SEEDS PO) Take 2,000 mg by mouth daily.    . fluticasone (FLONASE) 50 MCG/ACT nasal spray Place 2 sprays into both nostrils daily as needed for allergies.    . furosemide (LASIX) 40 MG tablet Take 40 mg by mouth daily before supper.     . gabapentin (NEURONTIN) 300 MG capsule Take 2 capsules by mouth 3 (three) times daily.    Marland Kitchen HYDROmorphone (DILAUDID) 2 MG tablet   0  . loratadine (KLS ALLERCLEAR) 10 MG tablet Take 10 mg by mouth daily as needed for allergies.    . methadone (DOLOPHINE) 5 MG tablet Take 5 mg by mouth every 8 (eight) hours.     . Multiple Vitamins-Minerals (MULTIVITAMIN PO) Take by mouth daily.    . Multiple Vitamins-Minerals (PRESERVISION AREDS PO) Take by mouth.    . senna (SENOKOT) 8.6 MG tablet Take 2-3 tablets by mouth 2 (two) times daily. Take two tablets in the morning and three tablets in the evening.    Marland Kitchen SYNTHROID 75 MCG tablet   4  . tiZANidine (ZANAFLEX) 4 MG tablet Take 1 tablet by mouth 3 (three) times daily as needed.     No current facility-administered medications for this visit.     Family History  Problem Relation Age of Onset  . Hyperlipidemia Father   . Hypertension Father   . Heart attack Father 85       deceased  . Heart attack Brother 47  . Breast cancer Mother   . Colon cancer Neg Hx   . Esophageal cancer Neg Hx   . Stomach cancer Neg Hx   . Rectal cancer Neg Hx     Review of Systems  Constitutional:       Weight gain   HENT: Positive for congestion.   Gastrointestinal:       Bloating   Genitourinary: Positive for urgency.       Loss of urine spontaneously   Musculoskeletal: Positive for myalgias.       Swelling Muscle  weakness   Skin:       Hair loss   Neurological: Positive for headaches.       Lack of coordination   Endo/Heme/Allergies: Bruises/bleeds easily.  All other systems reviewed and are negative.   Exam:   BP 104/60 (BP Location: Left Arm, Patient Position: Sitting, Cuff Size: Large)   Pulse 64   Resp 16   Ht 5' 2.5" (1.588 m)   Wt 196 lb (88.9 kg)   LMP 12/06/2000   BMI 35.28 kg/m     Height: 5' 2.5" (158.8 cm)  Ht Readings from Last 3 Encounters:  06/28/18 5' 2.5" (1.588 m)  01/04/17 5\' 2"  (1.575 m)  05/17/16 5\' 3"  (1.6 m)    General appearance: alert, cooperative and appears stated age Head: Normocephalic, without obvious abnormality, atraumatic Neck: no adenopathy, supple, symmetrical, trachea midline and thyroid normal to inspection and palpation Lungs: clear to auscultation bilaterally Breasts: normal appearance, no masses or tenderness Heart: regular rate and rhythm Abdomen: soft, non-tender; bowel sounds normal; no masses,  no organomegaly Extremities: extremities normal, atraumatic, no cyanosis or edema Skin: Skin color, texture, turgor normal. No rashes or lesions Lymph nodes: Cervical, supraclavicular, and axillary nodes normal. No abnormal inguinal nodes palpated Neurologic: Grossly normal   Pelvic: External genitalia:  no lesions  Urethra:  normal appearing urethra with no masses, tenderness or lesions              Bartholins and Skenes: normal                 Vagina: normal appearing vagina with normal color and discharge, no lesions              Cervix: absent              Pap taken: No. Bimanual Exam:  Uterus:  uterus absent              Adnexa: no mass, fullness, tenderness               Rectovaginal: Confirms               Anus:  normal sphincter tone, no lesions  Chaperone was present for exam.  A:  Well Woman with normal exam PMP, no HRT Hypertension Elevated lipids Chronic back pain with history of multiple surgeries, on methadone  and dilaudid H/o TAH/BSO, Halbans culdoplasty, A+P repair, TVT 11/15 Urinary urgency Multiple skin allergies  P:   Mammogram guidelines reviewed.   pap smear not indicated Urine culture rx for shingrix vaccination given Rx for Epipen given with 1RF return annually or prn

## 2018-06-28 NOTE — Patient Instructions (Addendum)
[facility] Phone: [phone]) Location: [facility] of [facility] and [place] ([place] Hours: 7:30 a.m. to 6:00 p.m., Monday through Friday.   [facility] Phone: [phone] Location: [place] Hours: 7:30 a.m. to 6:00 p.m., Monday through Friday.   Check with Dr. Sharlett Iles about your last bone denisty,

## 2018-07-01 LAB — URINE CULTURE

## 2018-07-03 ENCOUNTER — Telehealth: Payer: Self-pay | Admitting: *Deleted

## 2018-07-03 DIAGNOSIS — R3915 Urgency of urination: Secondary | ICD-10-CM

## 2018-07-03 NOTE — Telephone Encounter (Signed)
LM for pt to call back.

## 2018-07-03 NOTE — Telephone Encounter (Signed)
-----   Message from Megan Salon, MD sent at 07/03/2018 12:38 AM EDT ----- Please let pt know her urine culture was positive for enterococcus.  She has a lot of allergies but macrobid 100mg  bid x 5 days should work.    Needs repeat urine culture after about 10 days once treatment starts.  Thanks.

## 2018-07-04 MED ORDER — NITROFURANTOIN MONOHYD MACRO 100 MG PO CAPS: 100.0000 mg | ORAL_CAPSULE | Freq: Two times a day (BID) | ORAL | 0 refills | Status: DC

## 2018-07-04 NOTE — Telephone Encounter (Signed)
Patient returned call. Please call cell number.

## 2018-07-04 NOTE — Telephone Encounter (Signed)
Patient notified.  Rx sent to pharmacy Order placed. Appt scheduled

## 2018-07-04 NOTE — Telephone Encounter (Signed)
Left message with husband to call back.

## 2018-07-14 NOTE — Progress Notes (Signed)
Patient is here for a repeat urine culture. Patient denies any symptoms at this time and has finished medicaiton.  BP (!) 122/58 (BP Location: Right Arm, Patient Position: Sitting)   Pulse (!) 58   Ht 5' 2.5" (1.588 m)   Wt 196 lb (88.9 kg)   LMP 12/06/2000   BMI 35.28 kg/m

## 2018-07-17 ENCOUNTER — Ambulatory Visit (INDEPENDENT_AMBULATORY_CARE_PROVIDER_SITE_OTHER): Payer: Medicare Other

## 2018-07-17 VITALS — BP 122/58 | HR 58 | Ht 62.5 in | Wt 196.0 lb

## 2018-07-17 DIAGNOSIS — R3915 Urgency of urination: Secondary | ICD-10-CM

## 2018-07-18 DIAGNOSIS — M25861 Other specified joint disorders, right knee: Secondary | ICD-10-CM | POA: Diagnosis not present

## 2018-07-18 DIAGNOSIS — Z5189 Encounter for other specified aftercare: Secondary | ICD-10-CM | POA: Diagnosis not present

## 2018-07-18 DIAGNOSIS — M25462 Effusion, left knee: Secondary | ICD-10-CM | POA: Diagnosis not present

## 2018-07-18 DIAGNOSIS — Z96653 Presence of artificial knee joint, bilateral: Secondary | ICD-10-CM | POA: Diagnosis not present

## 2018-07-18 LAB — URINE CULTURE: ORGANISM ID, BACTERIA: NO GROWTH

## 2018-07-19 DIAGNOSIS — H35422 Microcystoid degeneration of retina, left eye: Secondary | ICD-10-CM | POA: Diagnosis not present

## 2018-07-19 DIAGNOSIS — H353221 Exudative age-related macular degeneration, left eye, with active choroidal neovascularization: Secondary | ICD-10-CM | POA: Diagnosis not present

## 2018-07-19 DIAGNOSIS — H353212 Exudative age-related macular degeneration, right eye, with inactive choroidal neovascularization: Secondary | ICD-10-CM | POA: Diagnosis not present

## 2018-07-19 DIAGNOSIS — H35433 Paving stone degeneration of retina, bilateral: Secondary | ICD-10-CM | POA: Diagnosis not present

## 2018-08-21 DIAGNOSIS — G894 Chronic pain syndrome: Secondary | ICD-10-CM | POA: Diagnosis not present

## 2018-08-21 DIAGNOSIS — M961 Postlaminectomy syndrome, not elsewhere classified: Secondary | ICD-10-CM | POA: Diagnosis not present

## 2018-08-21 DIAGNOSIS — M47818 Spondylosis without myelopathy or radiculopathy, sacral and sacrococcygeal region: Secondary | ICD-10-CM | POA: Diagnosis not present

## 2018-08-21 DIAGNOSIS — Z79891 Long term (current) use of opiate analgesic: Secondary | ICD-10-CM | POA: Diagnosis not present

## 2018-08-22 DIAGNOSIS — M545 Low back pain: Secondary | ICD-10-CM | POA: Diagnosis not present

## 2018-09-28 DIAGNOSIS — J209 Acute bronchitis, unspecified: Secondary | ICD-10-CM | POA: Diagnosis not present

## 2018-09-28 DIAGNOSIS — J0100 Acute maxillary sinusitis, unspecified: Secondary | ICD-10-CM | POA: Diagnosis not present

## 2018-09-28 DIAGNOSIS — I10 Essential (primary) hypertension: Secondary | ICD-10-CM | POA: Diagnosis not present

## 2018-09-28 DIAGNOSIS — Z6835 Body mass index (BMI) 35.0-35.9, adult: Secondary | ICD-10-CM | POA: Diagnosis not present

## 2018-09-28 DIAGNOSIS — G4733 Obstructive sleep apnea (adult) (pediatric): Secondary | ICD-10-CM | POA: Diagnosis not present

## 2018-10-11 DIAGNOSIS — H35433 Paving stone degeneration of retina, bilateral: Secondary | ICD-10-CM | POA: Diagnosis not present

## 2018-10-11 DIAGNOSIS — H353212 Exudative age-related macular degeneration, right eye, with inactive choroidal neovascularization: Secondary | ICD-10-CM | POA: Diagnosis not present

## 2018-10-11 DIAGNOSIS — H35422 Microcystoid degeneration of retina, left eye: Secondary | ICD-10-CM | POA: Diagnosis not present

## 2018-10-11 DIAGNOSIS — H353221 Exudative age-related macular degeneration, left eye, with active choroidal neovascularization: Secondary | ICD-10-CM | POA: Diagnosis not present

## 2018-10-19 DIAGNOSIS — Z79891 Long term (current) use of opiate analgesic: Secondary | ICD-10-CM | POA: Diagnosis not present

## 2018-10-19 DIAGNOSIS — M47818 Spondylosis without myelopathy or radiculopathy, sacral and sacrococcygeal region: Secondary | ICD-10-CM | POA: Diagnosis not present

## 2018-10-19 DIAGNOSIS — G894 Chronic pain syndrome: Secondary | ICD-10-CM | POA: Diagnosis not present

## 2018-10-19 DIAGNOSIS — M961 Postlaminectomy syndrome, not elsewhere classified: Secondary | ICD-10-CM | POA: Diagnosis not present

## 2018-10-23 DIAGNOSIS — Z23 Encounter for immunization: Secondary | ICD-10-CM | POA: Diagnosis not present

## 2018-12-14 DIAGNOSIS — M47818 Spondylosis without myelopathy or radiculopathy, sacral and sacrococcygeal region: Secondary | ICD-10-CM | POA: Diagnosis not present

## 2018-12-14 DIAGNOSIS — Z79891 Long term (current) use of opiate analgesic: Secondary | ICD-10-CM | POA: Diagnosis not present

## 2018-12-14 DIAGNOSIS — G894 Chronic pain syndrome: Secondary | ICD-10-CM | POA: Diagnosis not present

## 2018-12-14 DIAGNOSIS — M961 Postlaminectomy syndrome, not elsewhere classified: Secondary | ICD-10-CM | POA: Diagnosis not present

## 2019-01-16 DIAGNOSIS — E038 Other specified hypothyroidism: Secondary | ICD-10-CM | POA: Diagnosis not present

## 2019-01-16 DIAGNOSIS — N183 Chronic kidney disease, stage 3 (moderate): Secondary | ICD-10-CM | POA: Diagnosis not present

## 2019-01-16 DIAGNOSIS — E7849 Other hyperlipidemia: Secondary | ICD-10-CM | POA: Diagnosis not present

## 2019-01-16 DIAGNOSIS — R82998 Other abnormal findings in urine: Secondary | ICD-10-CM | POA: Diagnosis not present

## 2019-01-22 DIAGNOSIS — Z0000 Encounter for general adult medical examination without abnormal findings: Secondary | ICD-10-CM | POA: Diagnosis not present

## 2019-01-22 DIAGNOSIS — G4733 Obstructive sleep apnea (adult) (pediatric): Secondary | ICD-10-CM | POA: Diagnosis not present

## 2019-01-22 DIAGNOSIS — E7849 Other hyperlipidemia: Secondary | ICD-10-CM | POA: Diagnosis not present

## 2019-01-22 DIAGNOSIS — E038 Other specified hypothyroidism: Secondary | ICD-10-CM | POA: Diagnosis not present

## 2019-01-22 DIAGNOSIS — Z1339 Encounter for screening examination for other mental health and behavioral disorders: Secondary | ICD-10-CM | POA: Diagnosis not present

## 2019-01-22 DIAGNOSIS — I10 Essential (primary) hypertension: Secondary | ICD-10-CM | POA: Diagnosis not present

## 2019-01-22 DIAGNOSIS — R05 Cough: Secondary | ICD-10-CM | POA: Diagnosis not present

## 2019-01-22 DIAGNOSIS — M545 Low back pain: Secondary | ICD-10-CM | POA: Diagnosis not present

## 2019-01-22 DIAGNOSIS — E669 Obesity, unspecified: Secondary | ICD-10-CM | POA: Diagnosis not present

## 2019-01-22 DIAGNOSIS — Z1331 Encounter for screening for depression: Secondary | ICD-10-CM | POA: Diagnosis not present

## 2019-01-26 DIAGNOSIS — Z1212 Encounter for screening for malignant neoplasm of rectum: Secondary | ICD-10-CM | POA: Diagnosis not present

## 2019-02-13 DIAGNOSIS — G8929 Other chronic pain: Secondary | ICD-10-CM | POA: Diagnosis not present

## 2019-02-13 DIAGNOSIS — M40294 Other kyphosis, thoracic region: Secondary | ICD-10-CM | POA: Diagnosis not present

## 2019-02-13 DIAGNOSIS — M545 Low back pain: Secondary | ICD-10-CM | POA: Diagnosis not present

## 2019-02-13 DIAGNOSIS — M546 Pain in thoracic spine: Secondary | ICD-10-CM | POA: Diagnosis not present

## 2019-02-13 DIAGNOSIS — Z6827 Body mass index (BMI) 27.0-27.9, adult: Secondary | ICD-10-CM | POA: Diagnosis not present

## 2019-02-15 DIAGNOSIS — M47818 Spondylosis without myelopathy or radiculopathy, sacral and sacrococcygeal region: Secondary | ICD-10-CM | POA: Diagnosis not present

## 2019-02-15 DIAGNOSIS — M961 Postlaminectomy syndrome, not elsewhere classified: Secondary | ICD-10-CM | POA: Diagnosis not present

## 2019-02-15 DIAGNOSIS — G894 Chronic pain syndrome: Secondary | ICD-10-CM | POA: Diagnosis not present

## 2019-02-15 DIAGNOSIS — Z79891 Long term (current) use of opiate analgesic: Secondary | ICD-10-CM | POA: Diagnosis not present

## 2019-04-23 DIAGNOSIS — Z79891 Long term (current) use of opiate analgesic: Secondary | ICD-10-CM | POA: Diagnosis not present

## 2019-04-23 DIAGNOSIS — G894 Chronic pain syndrome: Secondary | ICD-10-CM | POA: Diagnosis not present

## 2019-04-23 DIAGNOSIS — M961 Postlaminectomy syndrome, not elsewhere classified: Secondary | ICD-10-CM | POA: Diagnosis not present

## 2019-04-23 DIAGNOSIS — M47818 Spondylosis without myelopathy or radiculopathy, sacral and sacrococcygeal region: Secondary | ICD-10-CM | POA: Diagnosis not present

## 2019-05-25 DIAGNOSIS — H35433 Paving stone degeneration of retina, bilateral: Secondary | ICD-10-CM | POA: Diagnosis not present

## 2019-05-25 DIAGNOSIS — H35422 Microcystoid degeneration of retina, left eye: Secondary | ICD-10-CM | POA: Diagnosis not present

## 2019-05-25 DIAGNOSIS — H353232 Exudative age-related macular degeneration, bilateral, with inactive choroidal neovascularization: Secondary | ICD-10-CM | POA: Diagnosis not present

## 2019-05-25 DIAGNOSIS — H43813 Vitreous degeneration, bilateral: Secondary | ICD-10-CM | POA: Diagnosis not present

## 2019-06-07 ENCOUNTER — Other Ambulatory Visit: Payer: Self-pay | Admitting: Obstetrics & Gynecology

## 2019-06-07 NOTE — Telephone Encounter (Signed)
Medication refill request: Nystatin  Last AEX:  06/28/18 Next AEX: 10/19/19 Last MMG (if hormonal medication request): 02/17/18 Bi-rads 1 neg  Refill authorized:30g 0 RF

## 2019-06-18 DIAGNOSIS — Z79891 Long term (current) use of opiate analgesic: Secondary | ICD-10-CM | POA: Diagnosis not present

## 2019-06-18 DIAGNOSIS — M47818 Spondylosis without myelopathy or radiculopathy, sacral and sacrococcygeal region: Secondary | ICD-10-CM | POA: Diagnosis not present

## 2019-06-18 DIAGNOSIS — G894 Chronic pain syndrome: Secondary | ICD-10-CM | POA: Diagnosis not present

## 2019-06-18 DIAGNOSIS — M961 Postlaminectomy syndrome, not elsewhere classified: Secondary | ICD-10-CM | POA: Diagnosis not present

## 2019-06-19 DIAGNOSIS — H353132 Nonexudative age-related macular degeneration, bilateral, intermediate dry stage: Secondary | ICD-10-CM | POA: Diagnosis not present

## 2019-06-19 DIAGNOSIS — H01029 Squamous blepharitis unspecified eye, unspecified eyelid: Secondary | ICD-10-CM | POA: Diagnosis not present

## 2019-06-19 DIAGNOSIS — H2513 Age-related nuclear cataract, bilateral: Secondary | ICD-10-CM | POA: Diagnosis not present

## 2019-07-13 DIAGNOSIS — H25812 Combined forms of age-related cataract, left eye: Secondary | ICD-10-CM | POA: Diagnosis not present

## 2019-08-03 DIAGNOSIS — H25811 Combined forms of age-related cataract, right eye: Secondary | ICD-10-CM | POA: Diagnosis not present

## 2019-08-03 HISTORY — PX: CATARACT EXTRACTION: SUR2

## 2019-08-14 DIAGNOSIS — M545 Low back pain: Secondary | ICD-10-CM | POA: Diagnosis not present

## 2019-08-15 DIAGNOSIS — Z79891 Long term (current) use of opiate analgesic: Secondary | ICD-10-CM | POA: Diagnosis not present

## 2019-08-15 DIAGNOSIS — G894 Chronic pain syndrome: Secondary | ICD-10-CM | POA: Diagnosis not present

## 2019-08-15 DIAGNOSIS — M961 Postlaminectomy syndrome, not elsewhere classified: Secondary | ICD-10-CM | POA: Diagnosis not present

## 2019-08-15 DIAGNOSIS — M47818 Spondylosis without myelopathy or radiculopathy, sacral and sacrococcygeal region: Secondary | ICD-10-CM | POA: Diagnosis not present

## 2019-08-22 DIAGNOSIS — H35433 Paving stone degeneration of retina, bilateral: Secondary | ICD-10-CM | POA: Diagnosis not present

## 2019-08-22 DIAGNOSIS — H35422 Microcystoid degeneration of retina, left eye: Secondary | ICD-10-CM | POA: Diagnosis not present

## 2019-08-22 DIAGNOSIS — H43813 Vitreous degeneration, bilateral: Secondary | ICD-10-CM | POA: Diagnosis not present

## 2019-08-22 DIAGNOSIS — H353232 Exudative age-related macular degeneration, bilateral, with inactive choroidal neovascularization: Secondary | ICD-10-CM | POA: Diagnosis not present

## 2019-08-24 ENCOUNTER — Other Ambulatory Visit: Payer: Self-pay

## 2019-08-24 ENCOUNTER — Telehealth: Payer: Self-pay | Admitting: Obstetrics & Gynecology

## 2019-08-24 ENCOUNTER — Encounter: Payer: Self-pay | Admitting: Obstetrics & Gynecology

## 2019-08-24 ENCOUNTER — Ambulatory Visit (INDEPENDENT_AMBULATORY_CARE_PROVIDER_SITE_OTHER): Payer: Medicare Other | Admitting: Obstetrics & Gynecology

## 2019-08-24 VITALS — BP 138/70 | HR 72 | Temp 97.2°F | Resp 14 | Wt 193.0 lb

## 2019-08-24 DIAGNOSIS — R3915 Urgency of urination: Secondary | ICD-10-CM

## 2019-08-24 LAB — POCT URINALYSIS DIPSTICK
Bilirubin, UA: NEGATIVE
Glucose, UA: NEGATIVE
Ketones, UA: NEGATIVE
Nitrite, UA: NEGATIVE
Odor: POSITIVE
Protein, UA: POSITIVE — AB
Urobilinogen, UA: 0.2 E.U./dL
pH, UA: 8 (ref 5.0–8.0)

## 2019-08-24 MED ORDER — NITROFURANTOIN MONOHYD MACRO 100 MG PO CAPS: 100.0000 mg | ORAL_CAPSULE | Freq: Two times a day (BID) | ORAL | 0 refills | Status: DC

## 2019-08-24 NOTE — Telephone Encounter (Signed)
Spoke with patient. Patient reports cloudy urine with odor on 9/17. Always has urgency. Left sided flank pain. Denies blood in urine, dysuria, fever/chills.  [id number] prescreen negative, precautions reviewed. Advised I will have to review schedule with Dr. Sabra Heck and return call.   Call reviewed with Dr. Sabra Heck. Call returned to patient. OV scheduled for today at 3:15pm. Patient is aware she will be worked into schedule. Patient verbalizes understanding and is agreeable.   Encounter closed.

## 2019-08-24 NOTE — Progress Notes (Signed)
GYNECOLOGY  VISIT  CC:   Urinary urgency/dysuria  HPI: 78 y.o. G15P2 Married White or Caucasian female here for possible uti. Patient has had an odor in urine x 2 days.  Urine is extremely cloudy per patient.  She is also having urinary urgency, frequency, lower back pain.  Denies blood in urine.  Denies fever.  Denies pelvic pain.    GYNECOLOGIC HISTORY: Patient's last menstrual period was 12/06/2000. Contraception: hysterectomy Menopausal hormone therapy: none  Patient Active Problem List   Diagnosis Date Noted  . History of total knee replacement, right 03/05/2018  . Hypokalemia 05/19/2016  . Sinus tachycardia 05/19/2016  . OA (osteoarthritis) of knee 05/17/2016  . 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 Failed total knee, right ([place]) 11/04/2015  . S/P TAH (total abdominal hysterectomy) 10/08/2014  . Pelvic prolapse 10/07/2014  . Cystocele 08/16/2014  . Glenohumeral arthritis 05/09/2014  . Right knee DJD 08/29/2012    Class: Chronic    Past Medical History:  Diagnosis Date  . Acquired left flat foot   . Acquired leg length discrepancy   . Allergic urticaria   . Allergy   . Anemia    took iron for a while  . Arthritis    lower back, knees  . Blood transfusion without reported diagnosis   . Bronchitis    history only  . Cataract   . Chronic fatigue syndrome   . Chronic pain    lower back  . Contusion of left hand   . Female bladder prolapse   . Fracture of metatarsal bone    w nonunion   . Hallux rigidus   . History of measles   . History of mumps   . History of rubella   . Hyperlipidemia   . Hypertension   . Hypothyroidism   . Imbalance   . Macular degeneration    wet- right eye  dry- left eye  . 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 Menopause   . Metatarsalgia of left foot   . Missed ab    x 6 - 2 surgery, 4 resolved on it's own  . Neuromuscular disorder ([place])    , left foot has some numbeness- uses cane  . Numbness     outer edge of heel left foot   . Osteopenia   . Pneumonia   . PONV (postoperative nausea and vomiting)    states she had scop patch in past,states she sets off alarms when she is waking up, she "forgets to breathe"   . Seasonal allergies   . Shingles   . Sinus infection    recent sinus infection, finished Zpak- 08/16/2013  . SVD (spontaneous vaginal delivery)    x 2  . Tinnitus   . Trigger middle finger of left hand   . Ulcer    hx  . Urinary incontinence   . Virus not detected 2010   postop, caught virus in hosp., respiratory symptoms, upon d/c to home, had allergic reaction /w plate size hives - not sure of the cause      Past Surgical History:  Procedure Laterality Date  . ABDOMINAL HYSTERECTOMY Bilateral 10/07/2014   Procedure: TOTAL ABDOMINAL HYSTERECTOMY WITH BILATERAL SALPINGO OOPHORECTOMY AND HALBANS CULDOELASTY ;  Surgeon: Lyman Speller, MD;  Location: [facility];  Service: Gynecology;  Laterality: Bilateral;  Aalyah Mansouri primary/Silva assist  4 hours total  . ANTERIOR AND POSTERIOR REPAIR N/A 10/07/2014   Procedure: ANTERIOR (CYSTOCELE) AND POSTERIOR REPAIR (RECTOCELE);  Surgeon: Lyman Speller, MD;  Location: [facility];  Service: Gynecology;  Laterality: N/A;  . APPENDECTOMY    . BACK SURGERY  09,00,10,99, 6/14,9/14   x6 surgeries on back -Spinal Fusin L4-L5, Lumbar Fusion L1-S1  . BLADDER SUSPENSION N/A 10/07/2014   Procedure: TRANSVAGINAL TAPE (TVT) EXACT PROCEDURE;  Surgeon: Lyman Speller, MD;  Location: [facility];  Service: Gynecology;  Laterality: N/A;  . BREAST EXCISIONAL BIOPSY Left 1998   benign  . BREAST SURGERY     left cyst- benign  . BUNIONECTOMY  03/02/11 and 11/04/11   left foot x2  . CARPAL TUNNEL RELEASE     both  . CATARACT EXTRACTION Left 08/03/2019  . CATARACT EXTRACTION Right 08/03/2019  . CYSTOSCOPY N/A 10/07/2014   Procedure: CYSTOSCOPY;  Surgeon: Lyman Speller, MD;  Location: [facility];  Service: Gynecology;  Laterality: N/A;  . DILATION AND  CURETTAGE OF UTERUS     x2  . Eye injections Left   . HAMMER TOE SURGERY  4/12   lt foot-2-3  . I&D and spina abscess L4-5    . JOINT REPLACEMENT     R- TKA- 2013  . KNEE ARTHROSCOPY  2007   both  . LUMBAR LAMINECTOMY  05/1999   L4-L5  . METATARSAL OSTEOTOMY  3/12   lt  . ORIF TOE FRACTURE  11/04/2011   Procedure: OPEN REDUCTION INTERNAL FIXATION (ORIF) METATARSAL (TOE) FRACTURE;  Surgeon: Wylene Simmer, MD;  Location: [facility];  Service: Orthopedics;  Laterality: Left;  left revision orif 1st metatarsal with autograft from left calcaneous and left 2nd-3rd metatarsal weil osteotomy  . osteoma  12   forehead x2  . OTHER SURGICAL HISTORY  05/08/13   hardware removed from left foot  . removal of two osteomas     on forehead-2008  . STERIOD INJECTION Left 11/04/2015   Procedure: STEROID INJECTION;  Surgeon: Gaynelle Arabian, MD;  Location: [facility];  Service: Orthopedics;  Laterality: Left;  . THUMB FUSION  2008   rt  . TOTAL KNEE ARTHROPLASTY  08/29/2012   Procedure: TOTAL KNEE ARTHROPLASTY;  Surgeon: Hessie Dibble, MD;  Location: [place] [facility];  Service: Orthopedics;  Laterality: Right;  . TOTAL KNEE ARTHROPLASTY Left 05/17/2016   Procedure: LEFT TOTAL KNEE ARTHROPLASTY;  Surgeon: Gaynelle Arabian, MD;  [facility];  Service: Orthopedics;  Laterality: Left;  . TOTAL KNEE ARTHROPLASTY Right   . TOTAL KNEE REVISION Right 11/04/2015   Procedure: TOTAL KNEE ARTHROPLASTY REVISION;  Surgeon: Gaynelle Arabian, MD;  [facility];  Service: Orthopedics;  Laterality: Right;  . TOTAL SHOULDER ARTHROPLASTY Right 05/09/2014   Procedure: TOTAL SHOULDER ARTHROPLASTY;  Surgeon: Nita Sells, MD;  Location: [place];  Service: Orthopedics;  Laterality: Right;  Right shoulder arthroplasty  . TUBAL LIGATION      MEDS:   Current Outpatient Medications on File Prior to Visit  Medication Sig Dispense Refill  . Ascorbic Acid (VITAMIN C PO) Take 1,000 mg by mouth daily.    . ASPIRIN PO Take  81 mg by mouth daily.    Marland Kitchen atorvastatin (LIPITOR) 20 MG tablet Take 20 mg by mouth at bedtime.     Marland Kitchen BIOTIN PO Take 5,000 mg by mouth 2 (two) times a week.    . Calcium-Magnesium-Vitamin D (CALCIUM 1200+D3 PO) Take 1 tablet by mouth 2 (two) times daily.    . carvedilol (COREG) 6.25 MG tablet Take 6.25 mg by mouth 2 (two) times daily with a meal.     .  Flaxseed, Linseed, (FLAX SEEDS PO) Take 2,000 mg by mouth daily.    . furosemide (LASIX) 40 MG tablet Take 40 mg by mouth daily before supper.     . gabapentin (NEURONTIN) 300 MG capsule Take 2 capsules by mouth 3 (three) times daily.    Marland Kitchen HYDROmorphone (DILAUDID) 2 MG tablet   0  . loratadine (KLS ALLERCLEAR) 10 MG tablet Take 10 mg by mouth daily as needed for allergies.    . methadone (DOLOPHINE) 5 MG tablet Take 5 mg by mouth every 8 (eight) hours.     . Multiple Vitamins-Minerals (MULTIVITAMIN PO) Take by mouth daily.    . Multiple Vitamins-Minerals (PRESERVISION AREDS PO) Take by mouth.    . nystatin-triamcinolone ointment (MYCOLOG) APPLY TO AFFECTED AREA TWICE A DAY. 30 g 0  . senna (SENOKOT) 8.6 MG tablet Take 2-3 tablets by mouth 2 (two) times daily. Take two tablets in the morning and three tablets in the evening.    Marland Kitchen SYNTHROID 75 MCG tablet   4  . tiZANidine (ZANAFLEX) 4 MG tablet Take 1 tablet by mouth 3 (three) times daily as needed.     No current facility-administered medications on file prior to visit.     ALLERGIES: Oxycodone, Flexeril [cyclobenzaprine], Red dye, Adhesive [tape], Ciprofloxacin, Crab [shellfish allergy], Etodolac, Povidone iodine, Pravastatin, Tylenol [acetaminophen], Latex, Lisinopril, Methocarbamol, Morphine and related, and Sulfa antibiotics  Family History  Problem Relation Age of Onset  . Hyperlipidemia Father   . Hypertension Father   . Heart attack Father 70       deceased  . Heart attack Brother 72  . Breast cancer Mother   . Colon cancer Neg Hx   . Esophageal cancer Neg Hx   . Stomach cancer  Neg Hx   . Rectal cancer Neg Hx     SH:  Married, non smoker  Review of Systems  Constitutional: Negative.   HENT: Negative.   Eyes: Negative.   Respiratory: Negative.   Cardiovascular: Negative.   Gastrointestinal: Negative.   Endocrine: Negative.   Genitourinary: Positive for frequency and urgency.       Cloudy urine Odor in urine  Musculoskeletal: Positive for back pain.  Skin: Negative.   Allergic/Immunologic: Negative.   Neurological: Negative.   Hematological: Negative.   Psychiatric/Behavioral: Negative.     PHYSICAL EXAMINATION:    BP 138/70 (BP Location: Right Arm, Patient Position: Sitting, Cuff Size: Large)   Pulse 72   Temp (!) 97.2 F (36.2 C) (Temporal)   Resp 14   Wt 193 lb (87.5 kg)   LMP 12/06/2000   BMI 34.74 kg/m     General appearance: alert, cooperative and appears stated age Abdomen: soft, non-tender; bowel sounds normal; no masses,  no organomegaly Lymph:  no inguinal LAD noted  Pelvic: External genitalia:  no lesions              Urethra:  normal appearing urethra with no masses, tenderness or lesions              Bartholins and Skenes: normal                 Vagina: normal appearing vagina with normal color and discharge, no lesions, cystocele              Bimanual Exam:  No pelvic masses noted  Chaperone was present for exam.  Assessment: Dysuria  Plan: Urine culture and micro pending Macrobid 100mg  bid x 5 days

## 2019-08-24 NOTE — Telephone Encounter (Signed)
Patient is calling with uti symptoms. To triage to assist with scheduling today.

## 2019-08-25 LAB — URINALYSIS, MICROSCOPIC ONLY
Casts: NONE SEEN /lpf
Epithelial Cells (non renal): NONE SEEN /hpf (ref 0–10)
WBC, UA: >30 /hpf — AB (ref 0–5)

## 2019-08-27 ENCOUNTER — Telehealth: Payer: Self-pay | Admitting: Obstetrics & Gynecology

## 2019-08-27 MED ORDER — AMOXICILLIN-POT CLAVULANATE 500-125 MG PO TABS: 1.0000 | ORAL_TABLET | Freq: Two times a day (BID) | ORAL | 0 refills | Status: DC

## 2019-08-27 NOTE — Telephone Encounter (Signed)
Patient was treated for a UTI last Friday and Dr.Miller told her to call if she did not notice any improvement in her symptoms.

## 2019-08-27 NOTE — Telephone Encounter (Signed)
Spoke with patient. Seen in office on 08/24/19 for urinary symptoms. Started Macrobid 100 mg bid. Reports low grade temp of 99 on 9/20, cramping and burning with urination. Urine cloudy and foamy today, no temp. Advised patient I will update Dr. Sabra Heck and return call, patient agreeable.   08/24/19 UC preliminary report, gram negative rods.    Dr. Sabra Heck -please review and advise.

## 2019-08-27 NOTE — Telephone Encounter (Signed)
Spoke with patient, advised as seen below per Dr. Sabra Heck. Rx for Augmentin 500 mg tab to verified pharmacy. Patient verbalizes understanding and is agreeable.   Encounter closed.

## 2019-08-27 NOTE — Telephone Encounter (Signed)
Ok to switch to augmentin 500mg  bid for 7 days.  Please have her monitor her temp and if it goes above 99.9 F, she needs to let me know.  Thanks.

## 2019-08-28 LAB — URINE CULTURE

## 2019-09-03 ENCOUNTER — Other Ambulatory Visit: Payer: Self-pay | Admitting: *Deleted

## 2019-09-03 DIAGNOSIS — R3915 Urgency of urination: Secondary | ICD-10-CM

## 2019-09-04 ENCOUNTER — Other Ambulatory Visit: Payer: Self-pay

## 2019-09-04 ENCOUNTER — Ambulatory Visit: Payer: Medicare Other

## 2019-09-04 DIAGNOSIS — R3915 Urgency of urination: Secondary | ICD-10-CM | POA: Diagnosis not present

## 2019-09-04 NOTE — Progress Notes (Signed)
Pt is here for [name] for UTI. States she completed the antibiotic and is feeling better, other than some lower back pain 3-4 days ago but none since.

## 2019-09-05 LAB — URINE CULTURE: Organism ID, Bacteria: NO GROWTH

## 2019-09-06 DIAGNOSIS — Z23 Encounter for immunization: Secondary | ICD-10-CM | POA: Diagnosis not present

## 2019-10-05 ENCOUNTER — Other Ambulatory Visit: Payer: Self-pay | Admitting: Obstetrics & Gynecology

## 2019-10-05 DIAGNOSIS — Z1231 Encounter for screening mammogram for malignant neoplasm of breast: Secondary | ICD-10-CM

## 2019-10-17 ENCOUNTER — Other Ambulatory Visit: Payer: Self-pay

## 2019-10-18 DIAGNOSIS — M47818 Spondylosis without myelopathy or radiculopathy, sacral and sacrococcygeal region: Secondary | ICD-10-CM | POA: Diagnosis not present

## 2019-10-18 DIAGNOSIS — G894 Chronic pain syndrome: Secondary | ICD-10-CM | POA: Diagnosis not present

## 2019-10-18 DIAGNOSIS — M961 Postlaminectomy syndrome, not elsewhere classified: Secondary | ICD-10-CM | POA: Diagnosis not present

## 2019-10-18 DIAGNOSIS — Z79891 Long term (current) use of opiate analgesic: Secondary | ICD-10-CM | POA: Diagnosis not present

## 2019-10-19 ENCOUNTER — Other Ambulatory Visit: Payer: Self-pay

## 2019-10-19 ENCOUNTER — Encounter

## 2019-10-19 ENCOUNTER — Ambulatory Visit (INDEPENDENT_AMBULATORY_CARE_PROVIDER_SITE_OTHER): Payer: Medicare Other | Admitting: Obstetrics & Gynecology

## 2019-10-19 ENCOUNTER — Encounter: Payer: Self-pay | Admitting: Obstetrics & Gynecology

## 2019-10-19 VITALS — BP 132/70 | HR 68 | Temp 97.3°F | Resp 12 | Ht 62.25 in | Wt 192.4 lb

## 2019-10-19 DIAGNOSIS — R3915 Urgency of urination: Secondary | ICD-10-CM

## 2019-10-19 DIAGNOSIS — Z01419 Encounter for gynecological examination (general) (routine) without abnormal findings: Secondary | ICD-10-CM

## 2019-10-19 NOTE — Progress Notes (Signed)
78 y.o. G28P2 Married White or Caucasian female here for annual exam.  Doing well.  Denies vaginal bleeding.    Patient's last menstrual period was 12/06/2000.          Sexually active: Yes.    The current method of family planning is status post hysterectomy.    Exercising: Yes.    walking Smoker:  no  Health Maintenance: Pap:  06/07/13 Neg  History of abnormal Pap:  no MMG:  02/16/18 BIRADS 1 negative/density a -- scheduled 11/26/19 Colonoscopy:  04/30/15 polyp BMD:   Possibly done in 2016 TDaP:  unsure Pneumonia vaccine(s):  Done with PCP Shingrix:   Never Flu vaccination:  completed Hep C testing: n/a Screening Labs: PCP   reports that she has never smoked. She has never used smokeless tobacco. She reports current alcohol use of about 1.0 standard drinks of alcohol per week. She reports that she does not use drugs.  Past Medical History:  Diagnosis Date  . Acquired left flat foot   . Acquired leg length discrepancy   . Allergic urticaria   . Allergy   . Anemia    took iron for a while  . Arthritis    lower back, knees  . Blood transfusion without reported diagnosis   . Bronchitis    history only  . Cataract   . Chronic fatigue syndrome   . Chronic pain    lower back  . Contusion of left hand   . Female bladder prolapse   . Fracture of metatarsal bone    w nonunion   . Hallux rigidus   . History of measles   . History of mumps   . History of rubella   . Hyperlipidemia   . Hypertension   . Hypothyroidism   . Imbalance   . Macular degeneration    wet- right eye  dry- left eye  . 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 Menopause   . Metatarsalgia of left foot   . Missed ab    x 6 - 2 surgery, 4 resolved on it's own  . Neuromuscular disorder ([place])    , left foot has some numbeness- uses cane  . Numbness    outer edge of heel left foot   . Osteopenia   . Pneumonia   . PONV (postoperative nausea and vomiting)    states she had scop patch in  past,states she sets off alarms when she is waking up, she "forgets to breathe"   . Seasonal allergies   . Shingles   . Sinus infection    recent sinus infection, finished Zpak- 08/16/2013  . SVD (spontaneous vaginal delivery)    x 2  . Tinnitus   . Trigger middle finger of left hand   . Ulcer    hx  . Urinary incontinence   . Virus not detected 2010   postop, caught virus in hosp., respiratory symptoms, upon d/c to home, had allergic reaction /w plate size hives - not sure of the cause      Past Surgical History:  Procedure Laterality Date  . ABDOMINAL HYSTERECTOMY Bilateral 10/07/2014   Procedure: TOTAL ABDOMINAL HYSTERECTOMY WITH BILATERAL SALPINGO OOPHORECTOMY AND HALBANS CULDOELASTY ;  Surgeon: Lyman Speller, MD;  Location: [facility];  Service: Gynecology;  Laterality: Bilateral;  Laqueisha Catalina primary/Silva assist  4 hours total  . ANTERIOR AND POSTERIOR REPAIR N/A 10/07/2014   Procedure: ANTERIOR (CYSTOCELE) AND POSTERIOR REPAIR (RECTOCELE);  Surgeon: Lyman Speller, MD;  Location:  [facility];  Service: Gynecology;  Laterality: N/A;  . APPENDECTOMY    . BACK SURGERY  09,00,10,99, 6/14,9/14   x6 surgeries on back -Spinal Fusin L4-L5, Lumbar Fusion L1-S1  . BLADDER SUSPENSION N/A 10/07/2014   Procedure: TRANSVAGINAL TAPE (TVT) EXACT PROCEDURE;  Surgeon: Lyman Speller, MD;  Location: [facility];  Service: Gynecology;  Laterality: N/A;  . BREAST EXCISIONAL BIOPSY Left 1998   benign  . BREAST SURGERY     left cyst- benign  . BUNIONECTOMY  03/02/11 and 11/04/11   left foot x2  . CARPAL TUNNEL RELEASE     both  . CATARACT EXTRACTION Left 08/03/2019  . CATARACT EXTRACTION Right 08/03/2019  . CYSTOSCOPY N/A 10/07/2014   Procedure: CYSTOSCOPY;  Surgeon: Lyman Speller, MD;  Location: [facility];  Service: Gynecology;  Laterality: N/A;  . DILATION AND CURETTAGE OF UTERUS     x2  . Eye injections Left   . HAMMER TOE SURGERY  4/12   lt foot-2-3  . I&D and spina abscess L4-5    .  JOINT REPLACEMENT     R- TKA- 2013  . KNEE ARTHROSCOPY  2007   both  . LUMBAR LAMINECTOMY  05/1999   L4-L5  . METATARSAL OSTEOTOMY  3/12   lt  . ORIF TOE FRACTURE  11/04/2011   Procedure: OPEN REDUCTION INTERNAL FIXATION (ORIF) METATARSAL (TOE) FRACTURE;  Surgeon: Wylene Simmer, MD;  Location: [place] [facility];  Service: Orthopedics;  Laterality: Left;  left revision orif 1st metatarsal with autograft from left calcaneous and left 2nd-3rd metatarsal weil osteotomy  . osteoma  12   forehead x2  . OTHER SURGICAL HISTORY  05/08/13   hardware removed from left foot  . removal of two osteomas     on forehead-2008  . STERIOD INJECTION Left 11/04/2015   Procedure: STEROID INJECTION;  Surgeon: Gaynelle Arabian, MD;  [facility];  Service: Orthopedics;  Laterality: Left;  . THUMB FUSION  2008   rt  . TOTAL KNEE ARTHROPLASTY  08/29/2012   Procedure: TOTAL KNEE ARTHROPLASTY;  Surgeon: Hessie Dibble, MD;  Location: [place] [facility];  Service: Orthopedics;  Laterality: Right;  . TOTAL KNEE ARTHROPLASTY Left 05/17/2016   Procedure: LEFT TOTAL KNEE ARTHROPLASTY;  Surgeon: Gaynelle Arabian, MD;  [facility];  Service: Orthopedics;  Laterality: Left;  . TOTAL KNEE ARTHROPLASTY Right   . TOTAL KNEE REVISION Right 11/04/2015   Procedure: TOTAL KNEE ARTHROPLASTY REVISION;  Surgeon: Gaynelle Arabian, MD;  [facility];  Service: Orthopedics;  Laterality: Right;  . TOTAL SHOULDER ARTHROPLASTY Right 05/09/2014   Procedure: TOTAL SHOULDER ARTHROPLASTY;  Surgeon: Nita Sells, MD;  Location: [facility];  Service: Orthopedics;  Laterality: Right;  Right shoulder arthroplasty  . TUBAL LIGATION      Current Outpatient Medications  Medication Sig Dispense Refill  . Ascorbic Acid (VITAMIN C PO) Take 1,000 mg by mouth daily.    . ASPIRIN PO Take 81 mg by mouth daily.    Marland Kitchen atorvastatin (LIPITOR) 20 MG tablet Take 20 mg by mouth at bedtime.     Marland Kitchen BIOTIN PO Take 5,000 mg by mouth 2 (two) times a week.    .  Calcium-Magnesium-Vitamin D (CALCIUM 1200+D3 PO) Take 1 tablet by mouth 2 (two) times daily.    . carvedilol (COREG) 6.25 MG tablet Take 6.25 mg by mouth 2 (two) times daily with a meal.     . Flaxseed, Linseed, (FLAX SEEDS PO) Take 2,000 mg by mouth daily.    Marland Kitchen  furosemide (LASIX) 40 MG tablet Take 40 mg by mouth daily before supper.     . gabapentin (NEURONTIN) 300 MG capsule Take 2 capsules by mouth 3 (three) times daily.    Marland Kitchen HYDROmorphone (DILAUDID) 2 MG tablet   0  . loratadine (KLS ALLERCLEAR) 10 MG tablet Take 10 mg by mouth daily as needed for allergies.    . methadone (DOLOPHINE) 5 MG tablet Take 5 mg by mouth every 8 (eight) hours.     . Multiple Vitamins-Minerals (MULTIVITAMIN PO) Take by mouth daily.    . Multiple Vitamins-Minerals (PRESERVISION AREDS PO) Take by mouth.    . nystatin-triamcinolone ointment (MYCOLOG) APPLY TO AFFECTED AREA TWICE A DAY. 30 g 0  . senna (SENOKOT) 8.6 MG tablet Take 2-3 tablets by mouth 2 (two) times daily. Take two tablets in the morning and three tablets in the evening.    Marland Kitchen SYNTHROID 75 MCG tablet   4  . tiZANidine (ZANAFLEX) 4 MG tablet Take 1 tablet by mouth 3 (three) times daily as needed.     No current facility-administered medications for this visit.     Family History  Problem Relation Age of Onset  . Hyperlipidemia Father   . Hypertension Father   . Heart attack Father 33       deceased  . Heart attack Brother 31  . Breast cancer Mother   . Colon cancer Neg Hx   . Esophageal cancer Neg Hx   . Stomach cancer Neg Hx   . Rectal cancer Neg Hx     Review of Systems  All other systems reviewed and are negative.   Exam:   BP 132/70 (BP Location: Right Arm, Patient Position: Sitting, Cuff Size: Normal)   Pulse 68   Temp (!) 97.3 F (36.3 C) (Temporal)   Resp 12   Ht 5' 2.25" (1.581 m)   Wt 192 lb 6.4 oz (87.3 kg)   LMP 12/06/2000   BMI 34.91 kg/m     Height: 5' 2.25" (158.1 cm)  Ht Readings from Last 3 Encounters:  10/19/19  5' 2.25" (1.581 m)  09/04/19 5' 2.5" (1.588 m)  07/17/18 5' 2.5" (1.588 m)    General appearance: alert, cooperative and appears stated age Head: Normocephalic, without obvious abnormality, atraumatic Neck: no adenopathy, supple, symmetrical, trachea midline and thyroid normal to inspection and palpation Lungs: clear to auscultation bilaterally Breasts: normal appearance, no masses or tenderness Heart: regular rate and rhythm Abdomen: soft, non-tender; bowel sounds normal; no masses,  no organomegaly Extremities: extremities normal, atraumatic, no cyanosis or edema Skin: Skin color, texture, turgor normal. No rashes or lesions Lymph nodes: Cervical, supraclavicular, and axillary nodes normal. No abnormal inguinal nodes palpated Neurologic: Grossly normal   Pelvic: External genitalia:  no lesions              Urethra:  normal appearing urethra with no masses, tenderness or lesions              Bartholins and Skenes: normal                 Vagina: normal appearing vagina with normal color and discharge, no lesions              Cervix: absent              Pap taken: No. Bimanual Exam:  Uterus:  uterus absent              Adnexa: no mass, fullness, tenderness  Rectovaginal: Confirms               Anus:  normal sphincter tone, no lesions  Chaperone was present for exam.  A:  Well Woman with normal exam PMP, no HRT Hypertension H/o elevated lipids Chronic back pain H/o TAH/BSO, Hal[place]ans culdoplasty, A&P repair, TVT 11/15 Urinary urgency Multiple skin allergies   P:   Mammogram guidelines reviewed pap smear not indicated Release of records for vaccinations and BMD signed today Colonoscopy is due next year Urine culture pendiing Return annually or prn

## 2019-10-21 LAB — URINE CULTURE: Organism ID, Bacteria: NO GROWTH

## 2019-11-16 DIAGNOSIS — H353232 Exudative age-related macular degeneration, bilateral, with inactive choroidal neovascularization: Secondary | ICD-10-CM | POA: Diagnosis not present

## 2019-11-26 ENCOUNTER — Other Ambulatory Visit: Payer: Self-pay

## 2019-11-26 ENCOUNTER — Ambulatory Visit
Admission: RE | Admit: 2019-11-26 | Discharge: 2019-11-26 | Disposition: A | Discharge status: Home / Self Care | Payer: Medicare Other | Source: Ambulatory Visit | Attending: Obstetrics & Gynecology | Admitting: Obstetrics & Gynecology

## 2019-11-26 DIAGNOSIS — Z1231 Encounter for screening mammogram for malignant neoplasm of breast: Secondary | ICD-10-CM

## 2019-12-21 ENCOUNTER — Ambulatory Visit: Payer: Medicare Other | Attending: Internal Medicine

## 2019-12-21 DIAGNOSIS — Z23 Encounter for immunization: Secondary | ICD-10-CM | POA: Insufficient documentation

## 2019-12-21 NOTE — Progress Notes (Signed)
   Covid-19 Vaccination Clinic  Name:  [name] [name]    MRN: [id number] DOB: [birth year]  12/21/2019  [name] was observed post Covid-19 immunization for 30 minutes based on pre-vaccination screening without incidence. She was provided with Vaccine Information Sheet and instruction to access the V-Safe system.   [name] was instructed to call 911 with any severe reactions post vaccine: Marland Kitchen Difficulty breathing  . Swelling of your face and throat  . A fast heartbeat  . A bad rash all over your body  . Dizziness and weakness    Immunizations Administered    Name Date Dose VIS Date Route   Pfizer COVID-19 Vaccine 12/21/2019  9:01 AM 0.3 mL 11/16/2019 Intramuscular   Manufacturer: [employer]   Lot: [id number]   [place]: [id number]

## 2020-01-08 ENCOUNTER — Ambulatory Visit: Payer: Medicare Other | Attending: Internal Medicine

## 2020-01-08 DIAGNOSIS — Z23 Encounter for immunization: Secondary | ICD-10-CM | POA: Insufficient documentation

## 2020-01-08 NOTE — Progress Notes (Signed)
   Covid-19 Vaccination Clinic  Name:  [name] [name]    MRN: [id number] DOB: October 28, 1941  01/08/2020  [name] [name] was observed post Covid-19 immunization for 30 minutes based on pre-vaccination screening without incidence. She was provided with Vaccine Information Sheet and instruction to access the V-Safe system.   [name] [name] was instructed to call 911 with any severe reactions post vaccine: Marland Kitchen Difficulty breathing  . Swelling of your face and throat  . A fast heartbeat  . A bad rash all over your body  . Dizziness and weakness    Immunizations Administered    Name Date Dose VIS Date Route   Pfizer COVID-19 Vaccine 01/08/2020  9:23 AM 0.3 mL 11/16/2019 Intramuscular   Manufacturer: [place]   Lot: [id number]   [place]: [id number]

## 2020-01-18 DIAGNOSIS — E038 Other specified hypothyroidism: Secondary | ICD-10-CM | POA: Diagnosis not present

## 2020-01-18 DIAGNOSIS — E7849 Other hyperlipidemia: Secondary | ICD-10-CM | POA: Diagnosis not present

## 2020-01-23 DIAGNOSIS — I10 Essential (primary) hypertension: Secondary | ICD-10-CM | POA: Diagnosis not present

## 2020-01-23 DIAGNOSIS — R82998 Other abnormal findings in urine: Secondary | ICD-10-CM | POA: Diagnosis not present

## 2020-01-28 DIAGNOSIS — N1831 Chronic kidney disease, stage 3a: Secondary | ICD-10-CM | POA: Diagnosis not present

## 2020-01-28 DIAGNOSIS — E785 Hyperlipidemia, unspecified: Secondary | ICD-10-CM | POA: Diagnosis not present

## 2020-01-28 DIAGNOSIS — E039 Hypothyroidism, unspecified: Secondary | ICD-10-CM | POA: Diagnosis not present

## 2020-01-28 DIAGNOSIS — M545 Low back pain: Secondary | ICD-10-CM | POA: Diagnosis not present

## 2020-01-28 DIAGNOSIS — R05 Cough: Secondary | ICD-10-CM | POA: Diagnosis not present

## 2020-01-28 DIAGNOSIS — G4733 Obstructive sleep apnea (adult) (pediatric): Secondary | ICD-10-CM | POA: Diagnosis not present

## 2020-01-28 DIAGNOSIS — E669 Obesity, unspecified: Secondary | ICD-10-CM | POA: Diagnosis not present

## 2020-01-28 DIAGNOSIS — Z0000 Encounter for general adult medical examination without abnormal findings: Secondary | ICD-10-CM | POA: Diagnosis not present

## 2020-01-28 DIAGNOSIS — I129 Hypertensive chronic kidney disease with stage 1 through stage 4 chronic kidney disease, or unspecified chronic kidney disease: Secondary | ICD-10-CM | POA: Diagnosis not present

## 2020-02-12 DIAGNOSIS — M40294 Other kyphosis, thoracic region: Secondary | ICD-10-CM | POA: Diagnosis not present

## 2020-02-12 DIAGNOSIS — R030 Elevated blood-pressure reading, without diagnosis of hypertension: Secondary | ICD-10-CM | POA: Diagnosis not present

## 2020-02-12 DIAGNOSIS — Z6833 Body mass index (BMI) 33.0-33.9, adult: Secondary | ICD-10-CM | POA: Diagnosis not present

## 2020-02-14 DIAGNOSIS — G894 Chronic pain syndrome: Secondary | ICD-10-CM | POA: Diagnosis not present

## 2020-02-14 DIAGNOSIS — Z79891 Long term (current) use of opiate analgesic: Secondary | ICD-10-CM | POA: Diagnosis not present

## 2020-02-19 DIAGNOSIS — H43813 Vitreous degeneration, bilateral: Secondary | ICD-10-CM | POA: Diagnosis not present

## 2020-02-19 DIAGNOSIS — H353232 Exudative age-related macular degeneration, bilateral, with inactive choroidal neovascularization: Secondary | ICD-10-CM | POA: Diagnosis not present

## 2020-02-19 DIAGNOSIS — H35422 Microcystoid degeneration of retina, left eye: Secondary | ICD-10-CM | POA: Diagnosis not present

## 2020-02-19 DIAGNOSIS — H35433 Paving stone degeneration of retina, bilateral: Secondary | ICD-10-CM | POA: Diagnosis not present

## 2020-03-03 DIAGNOSIS — H04123 Dry eye syndrome of bilateral lacrimal glands: Secondary | ICD-10-CM | POA: Diagnosis not present

## 2020-03-03 DIAGNOSIS — H353132 Nonexudative age-related macular degeneration, bilateral, intermediate dry stage: Secondary | ICD-10-CM | POA: Diagnosis not present

## 2020-03-03 DIAGNOSIS — H01029 Squamous blepharitis unspecified eye, unspecified eyelid: Secondary | ICD-10-CM | POA: Diagnosis not present

## 2020-03-03 DIAGNOSIS — Z961 Presence of intraocular lens: Secondary | ICD-10-CM | POA: Diagnosis not present

## 2020-04-10 DIAGNOSIS — M961 Postlaminectomy syndrome, not elsewhere classified: Secondary | ICD-10-CM | POA: Diagnosis not present

## 2020-04-10 DIAGNOSIS — M47818 Spondylosis without myelopathy or radiculopathy, sacral and sacrococcygeal region: Secondary | ICD-10-CM | POA: Diagnosis not present

## 2020-04-10 DIAGNOSIS — Z79891 Long term (current) use of opiate analgesic: Secondary | ICD-10-CM | POA: Diagnosis not present

## 2020-04-10 DIAGNOSIS — G894 Chronic pain syndrome: Secondary | ICD-10-CM | POA: Diagnosis not present

## 2020-05-08 ENCOUNTER — Other Ambulatory Visit: Payer: Self-pay | Admitting: Obstetrics & Gynecology

## 2020-05-08 NOTE — Telephone Encounter (Signed)
Medication refill request: Nystatin-Triamcinolone Ointment Last AEX:  10/19/19 SM Next AEX: 01/15/21 Last MMG (if hormonal medication request): n/a Refill authorized: Please advise on refill

## 2020-07-31 DIAGNOSIS — Z4789 Encounter for other orthopedic aftercare: Secondary | ICD-10-CM | POA: Diagnosis not present

## 2020-07-31 DIAGNOSIS — Z471 Aftercare following joint replacement surgery: Secondary | ICD-10-CM | POA: Diagnosis not present

## 2020-07-31 DIAGNOSIS — Z96653 Presence of artificial knee joint, bilateral: Secondary | ICD-10-CM | POA: Diagnosis not present

## 2020-08-12 DIAGNOSIS — M961 Postlaminectomy syndrome, not elsewhere classified: Secondary | ICD-10-CM | POA: Diagnosis not present

## 2020-08-12 DIAGNOSIS — G894 Chronic pain syndrome: Secondary | ICD-10-CM | POA: Diagnosis not present

## 2020-08-12 DIAGNOSIS — M545 Low back pain: Secondary | ICD-10-CM | POA: Diagnosis not present

## 2020-08-12 DIAGNOSIS — M47818 Spondylosis without myelopathy or radiculopathy, sacral and sacrococcygeal region: Secondary | ICD-10-CM | POA: Diagnosis not present

## 2020-08-12 DIAGNOSIS — Z79891 Long term (current) use of opiate analgesic: Secondary | ICD-10-CM | POA: Diagnosis not present

## 2020-08-12 DIAGNOSIS — M40294 Other kyphosis, thoracic region: Secondary | ICD-10-CM | POA: Diagnosis not present

## 2020-08-13 DIAGNOSIS — M25561 Pain in right knee: Secondary | ICD-10-CM | POA: Diagnosis not present

## 2020-08-13 DIAGNOSIS — Z9181 History of falling: Secondary | ICD-10-CM | POA: Diagnosis not present

## 2020-08-18 DIAGNOSIS — M25561 Pain in right knee: Secondary | ICD-10-CM | POA: Diagnosis not present

## 2020-08-18 DIAGNOSIS — Z9181 History of falling: Secondary | ICD-10-CM | POA: Diagnosis not present

## 2020-08-19 DIAGNOSIS — H35422 Microcystoid degeneration of retina, left eye: Secondary | ICD-10-CM | POA: Diagnosis not present

## 2020-08-19 DIAGNOSIS — H43813 Vitreous degeneration, bilateral: Secondary | ICD-10-CM | POA: Diagnosis not present

## 2020-08-19 DIAGNOSIS — H35433 Paving stone degeneration of retina, bilateral: Secondary | ICD-10-CM | POA: Diagnosis not present

## 2020-08-19 DIAGNOSIS — H353232 Exudative age-related macular degeneration, bilateral, with inactive choroidal neovascularization: Secondary | ICD-10-CM | POA: Diagnosis not present

## 2020-08-29 DIAGNOSIS — Z9181 History of falling: Secondary | ICD-10-CM | POA: Diagnosis not present

## 2020-08-29 DIAGNOSIS — M25561 Pain in right knee: Secondary | ICD-10-CM | POA: Diagnosis not present

## 2020-09-02 DIAGNOSIS — Z961 Presence of intraocular lens: Secondary | ICD-10-CM | POA: Diagnosis not present

## 2020-09-02 DIAGNOSIS — H01029 Squamous blepharitis unspecified eye, unspecified eyelid: Secondary | ICD-10-CM | POA: Diagnosis not present

## 2020-09-02 DIAGNOSIS — H04123 Dry eye syndrome of bilateral lacrimal glands: Secondary | ICD-10-CM | POA: Diagnosis not present

## 2020-09-02 DIAGNOSIS — H00022 Hordeolum internum right lower eyelid: Secondary | ICD-10-CM | POA: Diagnosis not present

## 2020-09-03 DIAGNOSIS — Z9181 History of falling: Secondary | ICD-10-CM | POA: Diagnosis not present

## 2020-09-03 DIAGNOSIS — M25561 Pain in right knee: Secondary | ICD-10-CM | POA: Diagnosis not present

## 2020-09-09 ENCOUNTER — Ambulatory Visit: Payer: Medicare Other

## 2020-09-10 DIAGNOSIS — Z9181 History of falling: Secondary | ICD-10-CM | POA: Diagnosis not present

## 2020-09-10 DIAGNOSIS — M25561 Pain in right knee: Secondary | ICD-10-CM | POA: Diagnosis not present

## 2020-09-11 ENCOUNTER — Ambulatory Visit: Payer: Medicare Other | Attending: Internal Medicine

## 2020-09-11 DIAGNOSIS — Z23 Encounter for immunization: Secondary | ICD-10-CM

## 2020-09-11 NOTE — Progress Notes (Signed)
   Covid-19 Vaccination Clinic  Name:  [name] [name]    MRN: [id number] DOB: 11-03-1941  09/11/2020  [name] [name] was observed post Covid-19 immunization for 15 minutes without incident. She was provided with Vaccine Information Sheet and instruction to access the V-Safe system.   [name] [name] was instructed to call 911 with any severe reactions post vaccine: Marland Kitchen Difficulty breathing  . Swelling of face and throat  . A fast heartbeat  . A bad rash all over body  . Dizziness and weakness

## 2020-09-16 DIAGNOSIS — H0012 Chalazion right lower eyelid: Secondary | ICD-10-CM | POA: Diagnosis not present

## 2020-09-20 DIAGNOSIS — Z23 Encounter for immunization: Secondary | ICD-10-CM | POA: Diagnosis not present

## 2020-10-08 DIAGNOSIS — G894 Chronic pain syndrome: Secondary | ICD-10-CM | POA: Diagnosis not present

## 2020-10-08 DIAGNOSIS — M961 Postlaminectomy syndrome, not elsewhere classified: Secondary | ICD-10-CM | POA: Diagnosis not present

## 2020-10-08 DIAGNOSIS — Z79891 Long term (current) use of opiate analgesic: Secondary | ICD-10-CM | POA: Diagnosis not present

## 2020-10-08 DIAGNOSIS — M47818 Spondylosis without myelopathy or radiculopathy, sacral and sacrococcygeal region: Secondary | ICD-10-CM | POA: Diagnosis not present

## 2020-11-11 ENCOUNTER — Other Ambulatory Visit: Payer: Self-pay | Admitting: Obstetrics & Gynecology

## 2020-11-11 DIAGNOSIS — Z1231 Encounter for screening mammogram for malignant neoplasm of breast: Secondary | ICD-10-CM

## 2020-11-18 DIAGNOSIS — H353232 Exudative age-related macular degeneration, bilateral, with inactive choroidal neovascularization: Secondary | ICD-10-CM | POA: Diagnosis not present

## 2020-12-02 ENCOUNTER — Ambulatory Visit
Admission: RE | Admit: 2020-12-02 | Discharge: 2020-12-02 | Disposition: A | Discharge status: Home / Self Care | Payer: Medicare Other | Source: Ambulatory Visit | Attending: Obstetrics & Gynecology | Admitting: Obstetrics & Gynecology

## 2020-12-02 ENCOUNTER — Other Ambulatory Visit: Payer: Self-pay

## 2020-12-02 DIAGNOSIS — Z1231 Encounter for screening mammogram for malignant neoplasm of breast: Secondary | ICD-10-CM | POA: Diagnosis not present

## 2020-12-03 ENCOUNTER — Ambulatory Visit: Payer: Medicare Other

## 2021-01-07 ENCOUNTER — Other Ambulatory Visit: Payer: Self-pay | Admitting: Obstetrics & Gynecology

## 2021-01-14 ENCOUNTER — Telehealth: Payer: Self-pay | Admitting: *Deleted

## 2021-01-14 ENCOUNTER — Other Ambulatory Visit: Payer: Self-pay | Admitting: Obstetrics & Gynecology

## 2021-01-14 MED ORDER — NYSTATIN-TRIAMCINOLONE 100000-0.1 UNIT/GM-% EX OINT
TOPICAL_OINTMENT | CUTANEOUS | 0 refills | Status: DC
Start: 2021-01-14 — End: 2021-02-02

## 2021-01-14 NOTE — Telephone Encounter (Signed)
Pt left VM message requesting a refill of Nystatin/Triamcinolone ointment. She would like it to be sent to [facility].

## 2021-01-15 ENCOUNTER — Ambulatory Visit: Payer: Medicare Other

## 2021-01-16 NOTE — Telephone Encounter (Signed)
We spoke with this pt earlier in the week.  The refill has been done and she does have an appt scheduled.  Thanks.

## 2021-01-26 DIAGNOSIS — E039 Hypothyroidism, unspecified: Secondary | ICD-10-CM | POA: Diagnosis not present

## 2021-01-26 DIAGNOSIS — E785 Hyperlipidemia, unspecified: Secondary | ICD-10-CM | POA: Diagnosis not present

## 2021-02-02 ENCOUNTER — Ambulatory Visit (INDEPENDENT_AMBULATORY_CARE_PROVIDER_SITE_OTHER): Payer: Medicare Other | Admitting: Obstetrics & Gynecology

## 2021-02-02 ENCOUNTER — Encounter (HOSPITAL_BASED_OUTPATIENT_CLINIC_OR_DEPARTMENT_OTHER): Payer: Self-pay | Admitting: Obstetrics & Gynecology

## 2021-02-02 ENCOUNTER — Other Ambulatory Visit: Payer: Self-pay

## 2021-02-02 VITALS — BP 135/64 | HR 68 | Ht 61.5 in | Wt 194.0 lb

## 2021-02-02 DIAGNOSIS — N1832 Chronic kidney disease, stage 3b: Secondary | ICD-10-CM | POA: Diagnosis not present

## 2021-02-02 DIAGNOSIS — E669 Obesity, unspecified: Secondary | ICD-10-CM | POA: Diagnosis not present

## 2021-02-02 DIAGNOSIS — N8111 Cystocele, midline: Secondary | ICD-10-CM

## 2021-02-02 DIAGNOSIS — Z90710 Acquired absence of both cervix and uterus: Secondary | ICD-10-CM

## 2021-02-02 DIAGNOSIS — Z0000 Encounter for general adult medical examination without abnormal findings: Secondary | ICD-10-CM | POA: Diagnosis not present

## 2021-02-02 DIAGNOSIS — E039 Hypothyroidism, unspecified: Secondary | ICD-10-CM | POA: Diagnosis not present

## 2021-02-02 DIAGNOSIS — N819 Female genital prolapse, unspecified: Secondary | ICD-10-CM

## 2021-02-02 DIAGNOSIS — E785 Hyperlipidemia, unspecified: Secondary | ICD-10-CM | POA: Diagnosis not present

## 2021-02-02 DIAGNOSIS — Z01411 Encounter for gynecological examination (general) (routine) with abnormal findings: Secondary | ICD-10-CM | POA: Diagnosis not present

## 2021-02-02 DIAGNOSIS — Z803 Family history of malignant neoplasm of breast: Secondary | ICD-10-CM | POA: Diagnosis not present

## 2021-02-02 DIAGNOSIS — I10 Essential (primary) hypertension: Secondary | ICD-10-CM | POA: Diagnosis not present

## 2021-02-02 DIAGNOSIS — M545 Low back pain, unspecified: Secondary | ICD-10-CM | POA: Diagnosis not present

## 2021-02-02 DIAGNOSIS — I129 Hypertensive chronic kidney disease with stage 1 through stage 4 chronic kidney disease, or unspecified chronic kidney disease: Secondary | ICD-10-CM | POA: Diagnosis not present

## 2021-02-02 DIAGNOSIS — R82998 Other abnormal findings in urine: Secondary | ICD-10-CM | POA: Diagnosis not present

## 2021-02-02 DIAGNOSIS — Z1212 Encounter for screening for malignant neoplasm of rectum: Secondary | ICD-10-CM | POA: Diagnosis not present

## 2021-02-02 DIAGNOSIS — G8929 Other chronic pain: Secondary | ICD-10-CM | POA: Diagnosis not present

## 2021-02-02 NOTE — Progress Notes (Signed)
80 y.o. [name] [name] Married White or Caucasian female here for breast and pelvic exam.  Pt and I have discussed gyn screening guidelines.  She desires to be seen yearly.  Not having much in the way of urinary issues.  Reports if she voids regularly she really has no issues.  Can leak if waits too long.  Denies vaginal bleeding.  Patient's last menstrual period was 12/06/2000.          Sexually active: Yes.    H/O STD:  no  Health Maintenance: PCP:  Dr. Sharlett Iles.  Last wellness appt was today.  Did blood work at that appt:  Did it last week Vaccines are up to date:  yes Colonoscopy:  2016 MMG:  12/02/2020 BMD:  ~2016, done in Dr. Buel Ream office Last pap smear:  Not indicated.   H/o abnormal pap smear:  Not indicated   reports that she has never smoked. She has never used smokeless tobacco. She reports current alcohol use of about 1.0 standard drink of alcohol per week. She reports that she does not use drugs.  Past Medical History:  Diagnosis Date  . Acquired left flat foot   . Acquired leg length discrepancy   . Allergic urticaria   . Allergy   . Anemia    took iron for a while  . Arthritis    lower back, knees  . Blood transfusion without reported diagnosis   . Bronchitis    history only  . Cataract   . Chronic fatigue syndrome   . Chronic pain    lower back  . Contusion of left hand   . Female bladder prolapse   . Fracture of metatarsal bone    w nonunion   . Gout   . Hallux rigidus   . History of measles   . History of mumps   . History of rubella   . Hyperlipidemia   . Hypertension   . Hypothyroidism   . Imbalance   . Macular degeneration    wet- right eye  dry- left eye  . 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 Metatarsalgia of left foot   . Missed ab    x 6 - 2 surgery, 4 resolved on it's own  . Neuromuscular disorder ([place])    , left foot has some numbeness- uses cane  . Numbness    outer edge of heel left foot   . Osteopenia   . Pneumonia    . PONV (postoperative nausea and vomiting)    states she had scop patch in past,states she sets off alarms when she is waking up, she "forgets to breathe"   . Seasonal allergies   . Shingles   . Sinus infection    recent sinus infection, finished Zpak- 08/16/2013  . SVD (spontaneous vaginal delivery)    x 2  . Tinnitus   . Trigger middle finger of left hand   . Ulcer    hx  . Urinary incontinence   . Virus not detected 2010   postop, caught virus in hosp., respiratory symptoms, upon d/c to home, had allergic reaction /w plate size hives - not sure of the cause      Past Surgical History:  Procedure Laterality Date  . ABDOMINAL HYSTERECTOMY Bilateral 10/07/2014   Procedure: TOTAL ABDOMINAL HYSTERECTOMY WITH BILATERAL SALPINGO OOPHORECTOMY AND HALBANS CULDOELASTY ;  Surgeon: Lyman Speller, MD;  Location: [facility];  Service: Gynecology;  Laterality: Bilateral;  Brogen Duell primary/Silva assist  4 hours total  .  ANTERIOR AND POSTERIOR REPAIR N/A 10/07/2014   Procedure: ANTERIOR (CYSTOCELE) AND POSTERIOR REPAIR (RECTOCELE);  Surgeon: Lyman Speller, MD;  Location: [facility];  Service: Gynecology;  Laterality: N/A;  . APPENDECTOMY    . BACK SURGERY  09,00,10,99, 6/14,9/14   x6 surgeries on back -Spinal Fusin L4-L5, Lumbar Fusion L1-S1  . BLADDER SUSPENSION N/A 10/07/2014   Procedure: TRANSVAGINAL TAPE (TVT) EXACT PROCEDURE;  Surgeon: Lyman Speller, MD;  Location: [facility];  Service: Gynecology;  Laterality: N/A;  . BREAST EXCISIONAL BIOPSY Left 1998   benign  . BREAST SURGERY     left cyst- benign  . BUNIONECTOMY  03/02/11 and 11/04/11   left foot x2  . CARPAL TUNNEL RELEASE     both  . CATARACT EXTRACTION Left 08/03/2019  . CATARACT EXTRACTION Right 08/03/2019  . CYSTOSCOPY N/A 10/07/2014   Procedure: CYSTOSCOPY;  Surgeon: Lyman Speller, MD;  Location: [facility];  Service: Gynecology;  Laterality: N/A;  . DILATION AND CURETTAGE OF UTERUS     x2  . Eye injections Left   .  HAMMER TOE SURGERY  4/12   lt foot-2-3  . I&D and spina abscess L4-5    . JOINT REPLACEMENT     R- TKA- 2013  . KNEE ARTHROSCOPY  2007   both  . LUMBAR LAMINECTOMY  05/1999   L4-L5  . METATARSAL OSTEOTOMY  3/12   lt  . ORIF TOE FRACTURE  11/04/2011   Procedure: OPEN REDUCTION INTERNAL FIXATION (ORIF) METATARSAL (TOE) FRACTURE;  Surgeon: Wylene Simmer, MD;  Location: [facility];  Service: Orthopedics;  Laterality: Left;  left revision orif 1st metatarsal with autograft from left calcaneous and left 2nd-3rd metatarsal weil osteotomy  . osteoma  12   forehead x2  . OTHER SURGICAL HISTORY  05/08/13   hardware removed from left foot  . removal of two osteomas     on forehead-2008  . STERIOD INJECTION Left 11/04/2015   Procedure: STEROID INJECTION;  Surgeon: Gaynelle Arabian, MD;  Location: [facility];  Service: Orthopedics;  Laterality: Left;  . THUMB FUSION  2008   rt  . TOTAL KNEE ARTHROPLASTY  08/29/2012   Procedure: TOTAL KNEE ARTHROPLASTY;  Surgeon: Hessie Dibble, MD;  Location: [facility];  Service: Orthopedics;  Laterality: Right;  . TOTAL KNEE ARTHROPLASTY Left 05/17/2016   Procedure: LEFT TOTAL KNEE ARTHROPLASTY;  Surgeon: Gaynelle Arabian, MD;  Location: [facility];  Service: Orthopedics;  Laterality: Left;  . TOTAL KNEE ARTHROPLASTY Right   . TOTAL KNEE REVISION Right 11/04/2015   Procedure: TOTAL KNEE ARTHROPLASTY REVISION;  Surgeon: Gaynelle Arabian, MD;  [facility];  Service: Orthopedics;  Laterality: Right;  . TOTAL SHOULDER ARTHROPLASTY Right 05/09/2014   Procedure: TOTAL SHOULDER ARTHROPLASTY;  Surgeon: Nita Sells, MD;  Location: [facility];  Service: Orthopedics;  Laterality: Right;  Right shoulder arthroplasty  . TUBAL LIGATION      Current Outpatient Medications  Medication Sig Dispense Refill  . Ascorbic Acid (VITAMIN C PO) Take 1,000 mg by mouth daily.    . ASPIRIN PO Take 81 mg by mouth daily.    Marland Kitchen atorvastatin (LIPITOR) 20 MG tablet Take 20 mg by mouth at  bedtime.     Marland Kitchen BIOTIN PO Take 5,000 mg by mouth 2 (two) times a week.    . Calcium-Magnesium-Vitamin D (CALCIUM 1200+D3 PO) Take 1 tablet by mouth 2 (two) times daily.    . carvedilol (COREG) 6.25 MG tablet Take 6.25 mg by mouth 2 (  two) times daily with a meal.     . Flaxseed, Linseed, (FLAX SEEDS PO) Take 2,000 mg by mouth daily.    . furosemide (LASIX) 40 MG tablet Take 40 mg by mouth daily before supper.    . gabapentin (NEURONTIN) 300 MG capsule Take 2 capsules by mouth 3 (three) times daily.    Marland Kitchen HYDROmorphone (DILAUDID) 2 MG tablet   0  . loratadine (CLARITIN) 10 MG tablet Take 10 mg by mouth daily as needed for allergies.    . methadone (DOLOPHINE) 5 MG tablet Take 5 mg by mouth every 8 (eight) hours.     . Multiple Vitamins-Minerals (MULTIVITAMIN PO) Take by mouth daily.    . Multiple Vitamins-Minerals (PRESERVISION AREDS PO) Take by mouth.    . senna (SENOKOT) 8.6 MG tablet Take 2-3 tablets by mouth 2 (two) times daily. Take two tablets in the morning and three tablets in the evening.    Marland Kitchen SYNTHROID 75 MCG tablet   4  . tiZANidine (ZANAFLEX) 4 MG tablet Take 1 tablet by mouth 3 (three) times daily as needed.     No current facility-administered medications for this visit.    Family History  Problem Relation Age of Onset  . Hyperlipidemia Father   . Hypertension Father   . Heart attack Father 64       deceased  . Heart attack Brother 32  . Breast cancer Mother   . Colon cancer Neg Hx   . Esophageal cancer Neg Hx   . Stomach cancer Neg Hx   . Rectal cancer Neg Hx     Review of Systems  All other systems reviewed and are negative.   Exam:   BP 135/64   Pulse 68   Ht 5' 1.5" (1.562 m)   Wt 194 lb (88 kg)   LMP 12/06/2000   BMI 36.06 kg/m   Height: 5' 1.5" (156.2 cm)  General appearance: alert, cooperative and appears stated age Breasts: normal appearance, no masses or tenderness Abdomen: soft, non-tender; bowel sounds normal; no masses,  no organomegaly Lymph  nodes: Cervical, supraclavicular, and axillary nodes normal.  No abnormal inguinal nodes palpated Neurologic: Grossly normal  Pelvic: External genitalia:  no lesions              Urethra:  normal appearing urethra with no masses, tenderness or lesions              Bartholins and Skenes: normal                 Vagina: normal appearing vagina with normal color and discharge, no lesions              Cervix: absent              Pap taken: No. Bimanual Exam:  Uterus:  uterus absent              Adnexa: no mass, fullness, tenderness               Rectovaginal: Confirms               Anus:  normal sphincter tone, no lesions  Chaperone, Shela Nevin, RN, present for exam  Assessment/Plan: 1. Encntr for gyn exam (general) (routine) w abnormal findings - pap not indicated - MMG 11/2020 -colonoscopy 2016 - BMD done with Dr. Sharlett Iles - asked pt to check with PCP about whether she's had a Hep C test - vaccines are all up to date  2.  Family history of breast cancer in first degree relative - continues yearly screening  3. S/P TAH (total abdominal hysterectomy)  4. Midline cystocele and vaginal vault prolapse - stable exam.  No additional recommendations made today.  21 minutes of total time was spent for this patient encounter, including preparation, face-to-face counseling with the patient and coordination of care, and documentation of the encounter.

## 2021-02-11 DIAGNOSIS — M961 Postlaminectomy syndrome, not elsewhere classified: Secondary | ICD-10-CM | POA: Diagnosis not present

## 2021-02-11 DIAGNOSIS — M47818 Spondylosis without myelopathy or radiculopathy, sacral and sacrococcygeal region: Secondary | ICD-10-CM | POA: Diagnosis not present

## 2021-02-11 DIAGNOSIS — Z79891 Long term (current) use of opiate analgesic: Secondary | ICD-10-CM | POA: Diagnosis not present

## 2021-02-11 DIAGNOSIS — G894 Chronic pain syndrome: Secondary | ICD-10-CM | POA: Diagnosis not present

## 2021-03-04 DIAGNOSIS — H353132 Nonexudative age-related macular degeneration, bilateral, intermediate dry stage: Secondary | ICD-10-CM | POA: Diagnosis not present

## 2021-03-04 DIAGNOSIS — H5203 Hypermetropia, bilateral: Secondary | ICD-10-CM | POA: Diagnosis not present

## 2021-03-04 DIAGNOSIS — Z961 Presence of intraocular lens: Secondary | ICD-10-CM | POA: Diagnosis not present

## 2021-03-04 DIAGNOSIS — H524 Presbyopia: Secondary | ICD-10-CM | POA: Diagnosis not present

## 2021-03-04 DIAGNOSIS — H04123 Dry eye syndrome of bilateral lacrimal glands: Secondary | ICD-10-CM | POA: Diagnosis not present

## 2021-03-04 DIAGNOSIS — H01029 Squamous blepharitis unspecified eye, unspecified eyelid: Secondary | ICD-10-CM | POA: Diagnosis not present

## 2021-03-04 DIAGNOSIS — H52203 Unspecified astigmatism, bilateral: Secondary | ICD-10-CM | POA: Diagnosis not present

## 2021-03-11 DIAGNOSIS — H35433 Paving stone degeneration of retina, bilateral: Secondary | ICD-10-CM | POA: Diagnosis not present

## 2021-03-11 DIAGNOSIS — H43813 Vitreous degeneration, bilateral: Secondary | ICD-10-CM | POA: Diagnosis not present

## 2021-03-11 DIAGNOSIS — H353232 Exudative age-related macular degeneration, bilateral, with inactive choroidal neovascularization: Secondary | ICD-10-CM | POA: Diagnosis not present

## 2021-03-31 DIAGNOSIS — Z23 Encounter for immunization: Secondary | ICD-10-CM | POA: Diagnosis not present

## 2021-04-10 DIAGNOSIS — G894 Chronic pain syndrome: Secondary | ICD-10-CM | POA: Diagnosis not present

## 2021-04-10 DIAGNOSIS — M961 Postlaminectomy syndrome, not elsewhere classified: Secondary | ICD-10-CM | POA: Diagnosis not present

## 2021-04-10 DIAGNOSIS — Z79891 Long term (current) use of opiate analgesic: Secondary | ICD-10-CM | POA: Diagnosis not present

## 2021-04-10 DIAGNOSIS — M47818 Spondylosis without myelopathy or radiculopathy, sacral and sacrococcygeal region: Secondary | ICD-10-CM | POA: Diagnosis not present

## 2021-04-20 ENCOUNTER — Telehealth (HOSPITAL_BASED_OUTPATIENT_CLINIC_OR_DEPARTMENT_OTHER): Payer: Self-pay

## 2021-04-20 NOTE — Telephone Encounter (Signed)
[name] [name] is a 80 y.o. female called in complains of UTI sx. Pt was booked for an appt 04/21/21 @ 11am

## 2021-04-21 ENCOUNTER — Other Ambulatory Visit: Payer: Self-pay

## 2021-04-21 ENCOUNTER — Other Ambulatory Visit (HOSPITAL_BASED_OUTPATIENT_CLINIC_OR_DEPARTMENT_OTHER)
Admission: RE | Admit: 2021-04-21 | Discharge: 2021-04-21 | Disposition: A | Discharge status: Home / Self Care | Payer: Medicare Other | Source: Ambulatory Visit | Attending: Obstetrics & Gynecology | Admitting: Obstetrics & Gynecology

## 2021-04-21 ENCOUNTER — Other Ambulatory Visit (HOSPITAL_BASED_OUTPATIENT_CLINIC_OR_DEPARTMENT_OTHER): Payer: Self-pay | Admitting: Emergency Medicine

## 2021-04-21 ENCOUNTER — Ambulatory Visit (INDEPENDENT_AMBULATORY_CARE_PROVIDER_SITE_OTHER): Payer: Medicare Other | Admitting: Obstetrics & Gynecology

## 2021-04-21 VITALS — HR 78 | Resp 16 | Wt 195.0 lb

## 2021-04-21 DIAGNOSIS — L659 Nonscarring hair loss, unspecified: Secondary | ICD-10-CM

## 2021-04-21 DIAGNOSIS — R350 Frequency of micturition: Secondary | ICD-10-CM | POA: Insufficient documentation

## 2021-04-21 DIAGNOSIS — Z9189 Other specified personal risk factors, not elsewhere classified: Secondary | ICD-10-CM

## 2021-04-21 DIAGNOSIS — R82998 Other abnormal findings in urine: Secondary | ICD-10-CM

## 2021-04-21 LAB — POCT URINALYSIS DIPSTICK
Appearance: ABNORMAL
Bilirubin, UA: NEGATIVE
Glucose, UA: NEGATIVE
Ketones, UA: NEGATIVE
Nitrite, UA: NEGATIVE
Protein, UA: NEGATIVE
Spec Grav, UA: 1.015 (ref 1.010–1.025)
Urobilinogen, UA: 0.2 E.U./dL
pH, UA: 7.5 (ref 5.0–8.0)

## 2021-04-21 LAB — IRON: Iron: 75 ug/dL (ref 28–170)

## 2021-04-21 LAB — FERRITIN: Ferritin: 33 ng/mL (ref 11–307)

## 2021-04-21 MED ORDER — AMOXICILLIN-POT CLAVULANATE 500-125 MG PO TABS
1.0000 | ORAL_TABLET | Freq: Two times a day (BID) | ORAL | 0 refills | Status: AC
Start: 2021-04-21 — End: 2021-04-28

## 2021-04-21 NOTE — Progress Notes (Signed)
GYNECOLOGY  VISIT  CC:   Discomfort with urination  HPI: 80 y.o. [phone] Married White or Caucasian female here for complaint of some discomfort with urination that started over the weekend.  It seems to have worsened in the last 24 hours.  Having some cloudy urine and dysuria at this point.  No new back pain, fever or pelvic pain.  No hematuria.  Denies vaginal discharge.  Unrelated, pt has complaint of loss of eyebrows.  Wants to know if any testing should be done.  GYNECOLOGIC HISTORY: Patient's last menstrual period was 12/06/2000. Contraception: PMP Menopausal hormone therapy: none  Patient Active Problem List   Diagnosis Date Noted  . History of total knee replacement, right 03/05/2018  . Hypokalemia 05/19/2016  . Sinus tachycardia 05/19/2016  . OA (osteoarthritis) of knee 05/17/2016  . 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 Failed total knee, right ([place]) 11/04/2015  . S/P TAH (total abdominal hysterectomy) 10/08/2014  . Pelvic prolapse 10/07/2014  . Cystocele 08/16/2014  . Glenohumeral arthritis 05/09/2014  . Right knee DJD 08/29/2012    Class: Chronic    Past Medical History:  Diagnosis Date  . Acquired left flat foot   . Acquired leg length discrepancy   . Allergic urticaria   . Allergy   . Anemia    took iron for a while  . Arthritis    lower back, knees  . Blood transfusion without reported diagnosis   . Bronchitis    history only  . Cataract   . Chronic fatigue syndrome   . Chronic pain    lower back  . Contusion of left hand   . Female bladder prolapse   . Fracture of metatarsal bone    w nonunion   . Gout   . Hallux rigidus   . History of measles   . History of mumps   . History of rubella   . Hyperlipidemia   . Hypertension   . Hypothyroidism   . Imbalance   . Macular degeneration    wet- right eye  dry- left eye  . 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 Metatarsalgia of left foot   . Missed ab    x 6 - 2  surgery, 4 resolved on it's own  . Neuromuscular disorder ([place])    , left foot has some numbeness- uses cane  . Numbness    outer edge of heel left foot   . Osteopenia   . Pneumonia   . PONV (postoperative nausea and vomiting)    states she had scop patch in past,states she sets off alarms when she is waking up, she "forgets to breathe"   . Seasonal allergies   . Shingles   . Sinus infection    recent sinus infection, finished Zpak- 08/16/2013  . SVD (spontaneous vaginal delivery)    x 2  . Tinnitus   . Trigger middle finger of left hand   . Ulcer    hx  . Urinary incontinence   . Virus not detected 2010   postop, caught virus in hosp., respiratory symptoms, upon d/c to home, had allergic reaction /w plate size hives - not sure of the cause      Past Surgical History:  Procedure Laterality Date  . ABDOMINAL HYSTERECTOMY Bilateral 10/07/2014   Procedure: TOTAL ABDOMINAL HYSTERECTOMY WITH BILATERAL SALPINGO OOPHORECTOMY AND HALBANS CULDOELASTY ;  Surgeon: Lyman Speller, MD;  Location: [facility];  Service: Gynecology;  Laterality: Bilateral;  Sabra Heck  primary/Silva assist  4 hours total  . ANTERIOR AND POSTERIOR REPAIR N/A 10/07/2014   Procedure: ANTERIOR (CYSTOCELE) AND POSTERIOR REPAIR (RECTOCELE);  Surgeon: Lyman Speller, MD;  Location: [facility];  Service: Gynecology;  Laterality: N/A;  . APPENDECTOMY    . BACK SURGERY  09,00,10,99, 6/14,9/14   x6 surgeries on back -Spinal Fusin L4-L5, Lumbar Fusion L1-S1  . BLADDER SUSPENSION N/A 10/07/2014   Procedure: TRANSVAGINAL TAPE (TVT) EXACT PROCEDURE;  Surgeon: Lyman Speller, MD;  Location: [facility];  Service: Gynecology;  Laterality: N/A;  . BREAST EXCISIONAL BIOPSY Left 1998   benign  . BREAST SURGERY     left cyst- benign  . BUNIONECTOMY  03/02/11 and 11/04/11   left foot x2  . CARPAL TUNNEL RELEASE     both  . CATARACT EXTRACTION Left 08/03/2019  . CATARACT EXTRACTION Right 08/03/2019  . CYSTOSCOPY N/A 10/07/2014    Procedure: CYSTOSCOPY;  Surgeon: Lyman Speller, MD;  Location: [facility];  Service: Gynecology;  Laterality: N/A;  . DILATION AND CURETTAGE OF UTERUS     x2  . Eye injections Left   . HAMMER TOE SURGERY  4/12   lt foot-2-3  . I&D and spina abscess L4-5    . JOINT REPLACEMENT     R- TKA- 2013  . KNEE ARTHROSCOPY  2007   both  . LUMBAR LAMINECTOMY  05/1999   L4-L5  . METATARSAL OSTEOTOMY  3/12   lt  . ORIF TOE FRACTURE  11/04/2011   Procedure: OPEN REDUCTION INTERNAL FIXATION (ORIF) METATARSAL (TOE) FRACTURE;  Surgeon: Wylene Simmer, MD;  Location: [facility];  Service: Orthopedics;  Laterality: Left;  left revision orif 1st metatarsal with autograft from left calcaneous and left 2nd-3rd metatarsal weil osteotomy  . osteoma  12   forehead x2  . OTHER SURGICAL HISTORY  05/08/13   hardware removed from left foot  . removal of two osteomas     on forehead-2008  . STERIOD INJECTION Left 11/04/2015   Procedure: STEROID INJECTION;  Surgeon: Gaynelle Arabian, MD;  [facility];  Service: Orthopedics;  Laterality: Left;  . THUMB FUSION  2008   rt  . TOTAL KNEE ARTHROPLASTY  08/29/2012   Procedure: TOTAL KNEE ARTHROPLASTY;  Surgeon: Hessie Dibble, MD;  Location: [facility];  Service: Orthopedics;  Laterality: Right;  . TOTAL KNEE ARTHROPLASTY Left 05/17/2016   Procedure: LEFT TOTAL KNEE ARTHROPLASTY;  Surgeon: Gaynelle Arabian, MD;  [facility];  Service: Orthopedics;  Laterality: Left;  . TOTAL KNEE ARTHROPLASTY Right   . TOTAL KNEE REVISION Right 11/04/2015   Procedure: TOTAL KNEE ARTHROPLASTY REVISION;  Surgeon: Gaynelle Arabian, MD;  [facility];  Service: Orthopedics;  Laterality: Right;  . TOTAL SHOULDER ARTHROPLASTY Right 05/09/2014   Procedure: TOTAL SHOULDER ARTHROPLASTY;  Surgeon: Nita Sells, MD;  Location: [facility];  Service: Orthopedics;  Laterality: Right;  Right shoulder arthroplasty  . TUBAL LIGATION      MEDS:   Current Outpatient Medications on File  Prior to Visit  Medication Sig Dispense Refill  . Ascorbic Acid (VITAMIN C PO) Take 1,000 mg by mouth daily.    . ASPIRIN PO Take 81 mg by mouth daily.    Marland Kitchen atorvastatin (LIPITOR) 20 MG tablet Take 20 mg by mouth at bedtime.     Marland Kitchen BIOTIN PO Take 5,000 mg by mouth 2 (two) times a week.    . Calcium-Magnesium-Vitamin D (CALCIUM 1200+D3 PO) Take 1 tablet by mouth 2 (two) times  daily.    . carvedilol (COREG) 6.25 MG tablet Take 6.25 mg by mouth 2 (two) times daily with a meal.     . Flaxseed, Linseed, (FLAX SEEDS PO) Take 2,000 mg by mouth daily.    . furosemide (LASIX) 40 MG tablet Take 40 mg by mouth daily before supper.    . gabapentin (NEURONTIN) 300 MG capsule Take 2 capsules by mouth 3 (three) times daily.    Marland Kitchen HYDROmorphone (DILAUDID) 2 MG tablet   0  . loratadine (CLARITIN) 10 MG tablet Take 10 mg by mouth daily as needed for allergies.    . methadone (DOLOPHINE) 5 MG tablet Take 5 mg by mouth every 8 (eight) hours.     . Multiple Vitamins-Minerals (MULTIVITAMIN PO) Take by mouth daily.    . Multiple Vitamins-Minerals (PRESERVISION AREDS PO) Take by mouth.    . senna (SENOKOT) 8.6 MG tablet Take 2-3 tablets by mouth 2 (two) times daily. Take two tablets in the morning and three tablets in the evening.    Marland Kitchen SYNTHROID 75 MCG tablet   4  . tiZANidine (ZANAFLEX) 4 MG tablet Take 1 tablet by mouth 3 (three) times daily as needed.     No current facility-administered medications on file prior to visit.    ALLERGIES: Oxycodone, Flexeril [cyclobenzaprine], Red dye, Adhesive [tape], Ciprofloxacin, Crab [shellfish allergy], Etodolac, Povidone iodine, Pravastatin, Purell instant hand [homeopathic products], Tylenol [acetaminophen], Latex, Lisinopril, Methocarbamol, Morphine and related, and Sulfa antibiotics  Family History  Problem Relation Age of Onset  . Hyperlipidemia Father   . Hypertension Father   . Heart attack Father 58       deceased  . Heart attack Brother 77  . Breast cancer  Mother   . Colon cancer Neg Hx   . Esophageal cancer Neg Hx   . Stomach cancer Neg Hx   . Rectal cancer Neg Hx     SH:  Married, non smoker  Review of Systems  Constitutional: Negative for chills and diaphoresis.  Gastrointestinal: Negative.   Genitourinary: Positive for dysuria. Negative for difficulty urinating, flank pain, hematuria, pelvic pain, vaginal bleeding and vaginal discharge.    PHYSICAL EXAMINATION:    Pulse 78   Resp 16   Wt 195 lb (88.5 kg)   LMP 12/06/2000   BMI 36.25 kg/m     General appearance: alert, cooperative and appears stated age Flank:  No CVA tenderness Lungs:  No labored breathing Abdomen: soft, non-tender; no suprapubic tenderness  Assessment/Plan: 1. Urinary frequency - POCT Urinalysis Dipstick - Urine Culture; Future  2. Leukocytes in urine - Urine Culture; Future  3. Hair loss - Ferritin; Future - Iron; Future  4. At risk for allergic reaction to medication - will start augmentin 500/125 mg bid x 7 days - creatinine clearance calculated prior to starting medication and no dosage adjustment is needed

## 2021-04-23 ENCOUNTER — Encounter (HOSPITAL_BASED_OUTPATIENT_CLINIC_OR_DEPARTMENT_OTHER): Payer: Self-pay | Admitting: Obstetrics & Gynecology

## 2021-04-30 LAB — URINE CULTURE: Culture: >=100000 — AB

## 2021-05-22 ENCOUNTER — Other Ambulatory Visit: Payer: Self-pay | Admitting: Obstetrics & Gynecology

## 2021-06-12 DIAGNOSIS — G894 Chronic pain syndrome: Secondary | ICD-10-CM | POA: Diagnosis not present

## 2021-06-12 DIAGNOSIS — Z79891 Long term (current) use of opiate analgesic: Secondary | ICD-10-CM | POA: Diagnosis not present

## 2021-06-12 DIAGNOSIS — M47818 Spondylosis without myelopathy or radiculopathy, sacral and sacrococcygeal region: Secondary | ICD-10-CM | POA: Diagnosis not present

## 2021-06-12 DIAGNOSIS — M961 Postlaminectomy syndrome, not elsewhere classified: Secondary | ICD-10-CM | POA: Diagnosis not present

## 2021-08-11 DIAGNOSIS — M545 Low back pain, unspecified: Secondary | ICD-10-CM | POA: Diagnosis not present

## 2021-08-11 DIAGNOSIS — G8929 Other chronic pain: Secondary | ICD-10-CM | POA: Diagnosis not present

## 2021-08-12 DIAGNOSIS — G894 Chronic pain syndrome: Secondary | ICD-10-CM | POA: Diagnosis not present

## 2021-08-12 DIAGNOSIS — Z79891 Long term (current) use of opiate analgesic: Secondary | ICD-10-CM | POA: Diagnosis not present

## 2021-08-12 DIAGNOSIS — M961 Postlaminectomy syndrome, not elsewhere classified: Secondary | ICD-10-CM | POA: Diagnosis not present

## 2021-08-12 DIAGNOSIS — M47818 Spondylosis without myelopathy or radiculopathy, sacral and sacrococcygeal region: Secondary | ICD-10-CM | POA: Diagnosis not present

## 2021-09-04 DIAGNOSIS — Z23 Encounter for immunization: Secondary | ICD-10-CM | POA: Diagnosis not present

## 2021-09-20 DIAGNOSIS — Z23 Encounter for immunization: Secondary | ICD-10-CM | POA: Diagnosis not present

## 2021-09-23 DIAGNOSIS — H353232 Exudative age-related macular degeneration, bilateral, with inactive choroidal neovascularization: Secondary | ICD-10-CM | POA: Diagnosis not present

## 2021-10-07 DIAGNOSIS — M47818 Spondylosis without myelopathy or radiculopathy, sacral and sacrococcygeal region: Secondary | ICD-10-CM | POA: Diagnosis not present

## 2021-10-07 DIAGNOSIS — M961 Postlaminectomy syndrome, not elsewhere classified: Secondary | ICD-10-CM | POA: Diagnosis not present

## 2021-10-07 DIAGNOSIS — Z79891 Long term (current) use of opiate analgesic: Secondary | ICD-10-CM | POA: Diagnosis not present

## 2021-10-07 DIAGNOSIS — G894 Chronic pain syndrome: Secondary | ICD-10-CM | POA: Diagnosis not present

## 2021-11-10 ENCOUNTER — Other Ambulatory Visit: Payer: Self-pay | Admitting: Obstetrics & Gynecology

## 2021-11-10 DIAGNOSIS — Z1231 Encounter for screening mammogram for malignant neoplasm of breast: Secondary | ICD-10-CM

## 2021-12-02 DIAGNOSIS — G894 Chronic pain syndrome: Secondary | ICD-10-CM | POA: Diagnosis not present

## 2021-12-02 DIAGNOSIS — M961 Postlaminectomy syndrome, not elsewhere classified: Secondary | ICD-10-CM | POA: Diagnosis not present

## 2021-12-02 DIAGNOSIS — M47818 Spondylosis without myelopathy or radiculopathy, sacral and sacrococcygeal region: Secondary | ICD-10-CM | POA: Diagnosis not present

## 2021-12-02 DIAGNOSIS — Z79891 Long term (current) use of opiate analgesic: Secondary | ICD-10-CM | POA: Diagnosis not present

## 2021-12-09 ENCOUNTER — Ambulatory Visit
Admission: RE | Admit: 2021-12-09 | Discharge: 2021-12-09 | Disposition: A | Discharge status: Home / Self Care | Payer: Medicare Other | Source: Ambulatory Visit | Attending: Obstetrics & Gynecology | Admitting: Obstetrics & Gynecology

## 2021-12-09 DIAGNOSIS — Z1231 Encounter for screening mammogram for malignant neoplasm of breast: Secondary | ICD-10-CM

## 2022-01-27 DIAGNOSIS — M961 Postlaminectomy syndrome, not elsewhere classified: Secondary | ICD-10-CM | POA: Diagnosis not present

## 2022-01-27 DIAGNOSIS — Z79891 Long term (current) use of opiate analgesic: Secondary | ICD-10-CM | POA: Diagnosis not present

## 2022-01-27 DIAGNOSIS — G894 Chronic pain syndrome: Secondary | ICD-10-CM | POA: Diagnosis not present

## 2022-01-27 DIAGNOSIS — M47818 Spondylosis without myelopathy or radiculopathy, sacral and sacrococcygeal region: Secondary | ICD-10-CM | POA: Diagnosis not present

## 2022-01-29 ENCOUNTER — Other Ambulatory Visit: Payer: Self-pay | Admitting: Obstetrics & Gynecology

## 2022-02-03 ENCOUNTER — Encounter (HOSPITAL_BASED_OUTPATIENT_CLINIC_OR_DEPARTMENT_OTHER): Payer: Self-pay | Admitting: Obstetrics & Gynecology

## 2022-02-03 ENCOUNTER — Ambulatory Visit (INDEPENDENT_AMBULATORY_CARE_PROVIDER_SITE_OTHER): Payer: Medicare Other | Admitting: Obstetrics & Gynecology

## 2022-02-03 ENCOUNTER — Other Ambulatory Visit: Payer: Self-pay

## 2022-02-03 VITALS — BP 130/60 | HR 74 | Ht 61.5 in | Wt 196.4 lb

## 2022-02-03 DIAGNOSIS — N8111 Cystocele, midline: Secondary | ICD-10-CM

## 2022-02-03 DIAGNOSIS — N816 Rectocele: Secondary | ICD-10-CM | POA: Diagnosis not present

## 2022-02-03 DIAGNOSIS — Z90710 Acquired absence of both cervix and uterus: Secondary | ICD-10-CM

## 2022-02-03 DIAGNOSIS — Z803 Family history of malignant neoplasm of breast: Secondary | ICD-10-CM | POA: Diagnosis not present

## 2022-02-03 DIAGNOSIS — Z01411 Encounter for gynecological examination (general) (routine) with abnormal findings: Secondary | ICD-10-CM | POA: Diagnosis not present

## 2022-02-03 DIAGNOSIS — M549 Dorsalgia, unspecified: Secondary | ICD-10-CM

## 2022-02-03 DIAGNOSIS — R2689 Other abnormalities of gait and mobility: Secondary | ICD-10-CM | POA: Diagnosis not present

## 2022-02-03 DIAGNOSIS — N1832 Chronic kidney disease, stage 3b: Secondary | ICD-10-CM | POA: Insufficient documentation

## 2022-02-03 NOTE — Progress Notes (Signed)
81 y.o. [id number] Married White or Caucasian female here for breast and pelvic exam.  Having some increased issues with feeling a protrusion vaginally.  She thinks this started around the holidays.  Does have some urinary leakage if feels urge to void and doesn't go.  Doesn't get up at night to void.  Never leaks at night.  Denies vaginal bleeding.    Has some bumps on breasts that she would like me to evaluate.    Having increased issues with thoracic spine.  Followed by Dr. Lovell Sheehan.  He does not recommend having surgery.  Having increased issues with standing up straight so is less steady on her feet.  Reports she did talk to Dr. Despina Hick about this with knee issues.  She saw someone in their office who had her to work on machines.  She was hoping to learn some things/exercises she could do at home.    Patient's last menstrual period was 12/06/2000.          Sexually active: Yes.    H/O STD:  no  Health Maintenance: PCP:  Dr. Jarold Motto.  Last wellness appt was last year.  Did blood work at that appt:  yes Vaccines are up to date:  reviewed Colonoscopy:  04/30/2015, follow up 10 MMG:  12/09/2021 Negative BMD:  12/31/2014 Last pap smear:  06/07/2013 Negative.   H/o abnormal pap smear:  no   reports that she has never smoked. She has never used smokeless tobacco. She reports current alcohol use of about 1.0 standard drink per week. She reports that she does not use drugs.  Past Medical History:  Diagnosis Date   Acquired left flat foot    Acquired leg length discrepancy    Allergic urticaria    Allergy    Anemia    took iron for a while   Arthritis    lower back, knees   Blood transfusion without reported diagnosis    Bronchitis    history only   Cataract    Chronic fatigue syndrome    Chronic pain    lower back   Contusion of left hand    Female bladder prolapse    Fracture of metatarsal bone    w nonunion    Gout    Hallux rigidus    History of measles    History of mumps     History of rubella    Hyperlipidemia    Hypertension    Hypothyroidism    Imbalance    Macular degeneration    wet- right eye  dry- left eye   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HCC)    Metatarsalgia of left foot    Missed ab    x 6 - 2 surgery, 4 resolved on it's own   Neuromuscular disorder (HCC)    , left foot has some numbeness- uses cane   Numbness    outer edge of heel left foot    Osteopenia    Pneumonia    PONV (postoperative nausea and vomiting)    states she had scop patch in past,states she sets off alarms when she is waking up, she "forgets to breathe"    Seasonal allergies    Shingles    Tinnitus    Trigger middle finger of left hand    Ulcer    hx   Urinary incontinence     Past Surgical History:  Procedure Laterality Date   ABDOMINAL HYSTERECTOMY Bilateral 10/07/2014   Procedure: TOTAL ABDOMINAL HYSTERECTOMY WITH BILATERAL  SALPINGO OOPHORECTOMY AND HALBANS CULDOELASTY ;  Surgeon: Annamaria Boots, MD;  Location: [facility];  Service: Gynecology;  Laterality: Bilateral;  Amritha Yorke primary/Silva assist  4 hours total   ANTERIOR AND POSTERIOR REPAIR N/A 10/07/2014   Procedure: ANTERIOR (CYSTOCELE) AND POSTERIOR REPAIR (RECTOCELE);  Surgeon: Annamaria Boots, MD;  Location: [facility];  Service: Gynecology;  Laterality: N/A;   APPENDECTOMY     BACK SURGERY  09,00,10,99, 6/14,9/14   x6 surgeries on back -Spinal Fusin L4-L5, Lumbar Fusion L1-S1   BLADDER SUSPENSION N/A 10/07/2014   Procedure: TRANSVAGINAL TAPE (TVT) EXACT PROCEDURE;  Surgeon: Annamaria Boots, MD;  Location: [facility];  Service: Gynecology;  Laterality: N/A;   BREAST EXCISIONAL BIOPSY Left 1998   benign   BREAST SURGERY     left cyst- benign   BUNIONECTOMY  03/02/11 and 11/04/11   left foot x2   CARPAL TUNNEL RELEASE     both   CATARACT EXTRACTION Left 08/03/2019   CATARACT EXTRACTION Right 08/03/2019   CYSTOSCOPY N/A 10/07/2014   Procedure: CYSTOSCOPY;  Surgeon: Annamaria Boots, MD;   Location: [facility];  Service: Gynecology;  Laterality: N/A;   DILATION AND CURETTAGE OF UTERUS     x2   Eye injections Left    HAMMER TOE SURGERY  4/12   lt foot-2-3   I&D and spina abscess L4-5     JOINT REPLACEMENT     R- TKA- 2013   KNEE ARTHROSCOPY  2007   both   LUMBAR LAMINECTOMY  05/1999   L4-L5   METATARSAL OSTEOTOMY  3/12   lt   ORIF TOE FRACTURE  11/04/2011   Procedure: OPEN REDUCTION INTERNAL FIXATION (ORIF) METATARSAL (TOE) FRACTURE;  Surgeon: Toni Arthurs, MD;  Location: [facility];  Service: Orthopedics;  Laterality: Left;  left revision orif 1st metatarsal with autograft from left calcaneous and left 2nd-3rd metatarsal weil osteotomy   osteoma  12   forehead x2   OTHER SURGICAL HISTORY  05/08/13   hardware removed from left foot   removal of two osteomas     on forehead-2008   STERIOD INJECTION Left 11/04/2015   Procedure: STEROID INJECTION;  Surgeon: Ollen Gross, MD;  Location: [facility];  Service: Orthopedics;  Laterality: Left;   THUMB FUSION  2008   rt   TOTAL KNEE ARTHROPLASTY  08/29/2012   Procedure: TOTAL KNEE ARTHROPLASTY;  Surgeon: Velna Ochs, MD;  Location: [facility];  Service: Orthopedics;  Laterality: Right;   TOTAL KNEE ARTHROPLASTY Left 05/17/2016   Procedure: LEFT TOTAL KNEE ARTHROPLASTY;  Surgeon: Ollen Gross, MD;  Location: [facility];  Service: Orthopedics;  Laterality: Left;   TOTAL KNEE ARTHROPLASTY Right    TOTAL KNEE REVISION Right 11/04/2015   Procedure: TOTAL KNEE ARTHROPLASTY REVISION;  Surgeon: Ollen Gross, MD;  Location: [facility];  Service: Orthopedics;  Laterality: Right;   TOTAL SHOULDER ARTHROPLASTY Right 05/09/2014   Procedure: TOTAL SHOULDER ARTHROPLASTY;  Surgeon: Mable Paris, MD;  [facility];  Service: Orthopedics;  Laterality: Right;  Right shoulder arthroplasty   TUBAL LIGATION      Current Outpatient Medications  Medication Sig Dispense Refill   Ascorbic Acid (VITAMIN C PO) Take 1,000 mg by mouth daily.      ASPIRIN PO Take 81 mg by mouth daily.     atorvastatin (LIPITOR) 20 MG tablet Take 20 mg by mouth at bedtime.      BIOTIN PO Take 5,000 mg by mouth 2 (two) times a week.  Calcium-Magnesium-Vitamin D (CALCIUM 1200+D3 PO) Take 1 tablet by mouth 2 (two) times daily.     carvedilol (COREG) 6.25 MG tablet Take 6.25 mg by mouth 2 (two) times daily with a meal.      Flaxseed, Linseed, (FLAX SEEDS PO) Take 2,000 mg by mouth daily.     furosemide (LASIX) 40 MG tablet Take 40 mg by mouth daily before supper.     gabapentin (NEURONTIN) 300 MG capsule Take 2 capsules by mouth 3 (three) times daily.     HYDROmorphone (DILAUDID) 2 MG tablet   0   loratadine (CLARITIN) 10 MG tablet Take 10 mg by mouth daily as needed for allergies.     methadone (DOLOPHINE) 5 MG tablet Take 5 mg by mouth every 8 (eight) hours.      Multiple Vitamins-Minerals (MULTIVITAMIN PO) Take by mouth daily.     Multiple Vitamins-Minerals (PRESERVISION AREDS PO) Take by mouth.     nystatin-triamcinolone ointment (MYCOLOG) Apply topically twice daily for up to 5 days. 30 g 0   senna (SENOKOT) 8.6 MG tablet Take 2-3 tablets by mouth 2 (two) times daily. Take two tablets in the morning and three tablets in the evening.     SYNTHROID 75 MCG tablet   4   tiZANidine (ZANAFLEX) 4 MG tablet Take 1 tablet by mouth 3 (three) times daily as needed.     No current facility-administered medications for this visit.    Family History  Problem Relation Age of Onset   Hyperlipidemia Father    Hypertension Father    Heart attack Father 66       deceased   Heart attack Brother 64   Breast cancer Mother    Colon cancer Neg Hx    Esophageal cancer Neg Hx    Stomach cancer Neg Hx    Rectal cancer Neg Hx     Review of Systems  Constitutional: Negative.   Genitourinary: Negative.    Exam:   BP 130/60 (BP Location: Left Arm, Patient Position: Sitting, Cuff Size: Large)   Pulse 74   Ht 5' 1.5" (1.562 m)   Wt 196 lb 6.4 oz (89.1 kg)    LMP 12/06/2000   BMI 36.51 kg/m   Height: 5' 1.5" (156.2 cm)  General appearance: alert, cooperative and appears stated age Breasts: normal appearance, no masses or tenderness.  Skin keratoses present (lesions pt had concerns about) Abdomen: soft, non-tender; bowel sounds normal; no masses,  no organomegaly Lymph nodes: Cervical, supraclavicular, and axillary nodes normal.  No abnormal inguinal nodes palpated Neurologic: Grossly normal  Pelvic: External genitalia:  no lesions              Urethra:  normal appearing urethra with no masses, tenderness or lesions              Bartholins and Skenes: normal                 Vagina: normal appearing vagina with atrophic changes and no discharge, no lesions, cystocele              Cervix: absent              Pap taken: No. Bimanual Exam:  Uterus:  uterus absent              Adnexa: no mass, fullness, tenderness               Rectovaginal: Confirms  Anus:  normal sphincter tone, no lesions  Chaperone, Ina Homes, CMA, was present for exam.  Assessment/Plan: 1. Encntr for gyn exam (general) (routine) w abnormal findings - pap smear not indicated - MMG 12/2021 - colonoscopy 04/2015, follow up 10 years - BMD 12/2014, possible sooner.  Does with Dr. Norval Gable office - lab work is done with Dr. Jarold Motto - vaccines reviewed/updated  2. Rectocele - treatment with pessary, PT or possibly surgery (which the last I do not recommend).  Pt feel fine monitoring for now.  3. Family history of breast cancer in first degree relative  4. S/P TAH (total abdominal hysterectomy)  5. Midline cystocele  6. Mid back pain - AMB referral to sports medicine  7. Imbalance

## 2022-02-10 ENCOUNTER — Telehealth (HOSPITAL_BASED_OUTPATIENT_CLINIC_OR_DEPARTMENT_OTHER): Payer: Self-pay

## 2022-02-10 ENCOUNTER — Encounter: Payer: Self-pay | Admitting: Family Medicine

## 2022-02-10 NOTE — Telephone Encounter (Signed)
LMOM at 9:30 for patient to call office about message per Dr. Sabra Heck. tbw ?

## 2022-02-10 NOTE — Telephone Encounter (Signed)
-----   Message from Megan Salon, MD sent at 02/09/2022  5:47 AM EST ----- ?Regarding: PT or sports medicine ?Could you please call this pt and see if she would like to see Dr. Lois Huxley, sports medicine or just have a PT referral.  I asked him about her and he thinks and evaluation might be helpful.  There may be some other non surgical options he can offer.  His office usually sees pts very quickly so it wouldn't be a long wait.  He did give me some PT options for her so I can just do a referral there as well.  I'll place whatever referral she wants.  Thanks. ? ?Vinnie Level ? ?

## 2022-02-15 NOTE — Telephone Encounter (Signed)
Called and spoke with patient about the below  message per Dr. Sabra Heck. [place] this appointment. Since she has come back, she got in touch with Dr. Thompson Caul office and she is scheduled to see him at the end of the month. 03/03/22. tbw ?

## 2022-02-16 DIAGNOSIS — I10 Essential (primary) hypertension: Secondary | ICD-10-CM | POA: Diagnosis not present

## 2022-02-16 DIAGNOSIS — E785 Hyperlipidemia, unspecified: Secondary | ICD-10-CM | POA: Diagnosis not present

## 2022-02-16 DIAGNOSIS — E039 Hypothyroidism, unspecified: Secondary | ICD-10-CM | POA: Diagnosis not present

## 2022-02-23 DIAGNOSIS — Z0000 Encounter for general adult medical examination without abnormal findings: Secondary | ICD-10-CM | POA: Diagnosis not present

## 2022-02-23 DIAGNOSIS — N1831 Chronic kidney disease, stage 3a: Secondary | ICD-10-CM | POA: Diagnosis not present

## 2022-02-23 DIAGNOSIS — E039 Hypothyroidism, unspecified: Secondary | ICD-10-CM | POA: Diagnosis not present

## 2022-02-23 DIAGNOSIS — I129 Hypertensive chronic kidney disease with stage 1 through stage 4 chronic kidney disease, or unspecified chronic kidney disease: Secondary | ICD-10-CM | POA: Diagnosis not present

## 2022-02-23 DIAGNOSIS — Z1331 Encounter for screening for depression: Secondary | ICD-10-CM | POA: Diagnosis not present

## 2022-02-23 DIAGNOSIS — Z1339 Encounter for screening examination for other mental health and behavioral disorders: Secondary | ICD-10-CM | POA: Diagnosis not present

## 2022-02-23 DIAGNOSIS — M545 Low back pain, unspecified: Secondary | ICD-10-CM | POA: Diagnosis not present

## 2022-02-23 DIAGNOSIS — G4733 Obstructive sleep apnea (adult) (pediatric): Secondary | ICD-10-CM | POA: Diagnosis not present

## 2022-02-23 DIAGNOSIS — E785 Hyperlipidemia, unspecified: Secondary | ICD-10-CM | POA: Diagnosis not present

## 2022-02-24 DIAGNOSIS — R82998 Other abnormal findings in urine: Secondary | ICD-10-CM | POA: Diagnosis not present

## 2022-03-02 NOTE — Progress Notes (Signed)
?Charlann Boxer D.O. ?[facility] ?[place] ?Phone: [phone] ?Subjective:   ?I, [name] [name], am serving as a Education administrator for Dr. Hulan Saas. ?This visit occurred during the SARS-CoV-2 public health emergency.  Safety protocols were in place, including screening questions prior to the visit, additional usage of staff PPE, and extensive cleaning of exam room while observing appropriate contact time as indicated for disinfecting solutions.  ? ?I'm seeing this patient by the request  of:  [name], MD ? ?CC: back pain  ? ?[id number]  ?[name] [name] is a 81 y.o. female coming in with complaint of back pain. Back surgery in 2014. Constant pain, 6 back surgery. Depends on the day where the pain is. LBP and between shoulder blades and going down. Sometimes finger tips go numb on left hand. Radiating pain and numbness down left. Loss of sensation in left foot.  Cannot take tylenol, can take advil. Will take an Advil PM. Heat and ice, but doesn't really help. Also tried tens and doesn't work. ?Patient has had also failed knee replacement previously. ? ?  ? ?Past Medical History:  ?Diagnosis Date  ? Acquired left flat foot   ? Acquired leg length discrepancy   ? Allergic urticaria   ? Allergy   ? Anemia   ? took iron for a while  ? Arthritis   ? lower back, knees  ? Blood transfusion without reported diagnosis   ? Bronchitis   ? history only  ? Cataract   ? Chronic fatigue syndrome   ? Chronic pain   ? lower back  ? Contusion of left hand   ? Female bladder prolapse   ? Fracture of metatarsal bone   ? w nonunion   ? Gout   ? Hallux rigidus   ? History of measles   ? History of mumps   ? History of rubella   ? Hyperlipidemia   ? Hypertension   ? Hypothyroidism   ? Imbalance   ? Macular degeneration   ? wet- right eye  dry- left eye  ? 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HCC)   ? Metatarsalgia of left foot   ? Missed ab   ? x 6 - 2 surgery, 4 resolved on it's own  ?  Neuromuscular disorder ([place])   ? , left foot has some numbeness- uses cane  ? Numbness   ? outer edge of heel left foot   ? Osteopenia   ? Pneumonia   ? PONV (postoperative nausea and vomiting)   ? states she had scop patch in past,states she sets off alarms when she is waking up, she "forgets to breathe"   ? Seasonal allergies   ? Shingles   ? Tinnitus   ? Trigger middle finger of left hand   ? Ulcer   ? hx  ? Urinary incontinence   ? ?Past Surgical History:  ?Procedure Laterality Date  ? ABDOMINAL HYSTERECTOMY Bilateral 10/07/2014  ? Procedure: TOTAL ABDOMINAL HYSTERECTOMY WITH BILATERAL SALPINGO OOPHORECTOMY AND HALBANS CULDOELASTY ;  Surgeon: Lyman Speller, MD;  Location: [facility];  Service: Gynecology;  Laterality: Bilateral;  Miller primary/Silva assist  4 hours total  ? ANTERIOR AND POSTERIOR REPAIR N/A 10/07/2014  ? Procedure: ANTERIOR (CYSTOCELE) AND POSTERIOR REPAIR (RECTOCELE);  Surgeon: Lyman Speller, MD;  Location: [facility];  Service: Gynecology;  Laterality: N/A;  ? APPENDECTOMY    ? BACK SURGERY  09,00,10,99, 6/14,9/14  ? x6 surgeries on back -  Spinal Fusin L4-L5, Lumbar Fusion L1-S1  ? BLADDER SUSPENSION N/A 10/07/2014  ? Procedure: TRANSVAGINAL TAPE (TVT) EXACT PROCEDURE;  Surgeon: Lyman Speller, MD;  Location: [facility];  Service: Gynecology;  Laterality: N/A;  ? BREAST EXCISIONAL BIOPSY Left 1998  ? benign  ? BREAST SURGERY    ? left cyst- benign  ? BUNIONECTOMY  03/02/11 and 11/04/11  ? left foot x2  ? CARPAL TUNNEL RELEASE    ? both  ? CATARACT EXTRACTION Left 08/03/2019  ? CATARACT EXTRACTION Right 08/03/2019  ? CYSTOSCOPY N/A 10/07/2014  ? Procedure: CYSTOSCOPY;  Surgeon: Lyman Speller, MD;  Location: [facility];  Service: Gynecology;  Laterality: N/A;  ? DILATION AND CURETTAGE OF UTERUS    ? x2  ? Eye injections Left   ? HAMMER TOE SURGERY  4/12  ? lt foot-2-3  ? I&D and spina abscess L4-5    ? JOINT REPLACEMENT    ? R- TKA- 2013  ? KNEE ARTHROSCOPY  2007  ? both  ? LUMBAR LAMINECTOMY   05/1999  ? L4-L5  ? METATARSAL OSTEOTOMY  3/12  ? lt  ? ORIF TOE FRACTURE  11/04/2011  ? Procedure: OPEN REDUCTION INTERNAL FIXATION (ORIF) METATARSAL (TOE) FRACTURE;  Surgeon: Wylene Simmer, MD;  Location: [facility];  Service: Orthopedics;  Laterality: Left;  left revision orif 1st metatarsal with autograft from left calcaneous and left 2nd-3rd metatarsal weil osteotomy  ? osteoma  12  ? forehead x2  ? OTHER SURGICAL HISTORY  05/08/13  ? hardware removed from left foot  ? removal of two osteomas    ? on forehead-2008  ? STERIOD INJECTION Left 11/04/2015  ? Procedure: STEROID INJECTION;  Surgeon: Gaynelle Arabian, MD;  Location: [facility];  Service: Orthopedics;  Laterality: Left;  ? THUMB FUSION  2008  ? rt  ? TOTAL KNEE ARTHROPLASTY  08/29/2012  ? Procedure: TOTAL KNEE ARTHROPLASTY;  Surgeon: Hessie Dibble, MD;  Location: [facility];  Service: Orthopedics;  Laterality: Right;  ? TOTAL KNEE ARTHROPLASTY Left 05/17/2016  ? Procedure: LEFT TOTAL KNEE ARTHROPLASTY;  Surgeon: Gaynelle Arabian, MD;  Location: [facility];  Service: Orthopedics;  Laterality: Left;  ? TOTAL KNEE ARTHROPLASTY Right   ? TOTAL KNEE REVISION Right 11/04/2015  ? Procedure: TOTAL KNEE ARTHROPLASTY REVISION;  Surgeon: Gaynelle Arabian, MD;  Location: [facility];  Service: Orthopedics;  Laterality: Right;  ? TOTAL SHOULDER ARTHROPLASTY Right 05/09/2014  ? Procedure: TOTAL SHOULDER ARTHROPLASTY;  Surgeon: Nita Sells, MD;  Location: [facility];  Service: Orthopedics;  Laterality: Right;  Right shoulder arthroplasty  ? TUBAL LIGATION    ? ?Social History  ? ?Socioeconomic History  ? Marital status: Married  ?  Spouse name: Not on file  ? Number of children: Not on file  ? Years of education: Not on file  ? Highest education level: Not on file  ?Occupational History  ? Not on file  ?Tobacco Use  ? Smoking status: Never  ? Smokeless tobacco: Never  ?Vaping Use  ? Vaping Use: Never used  ?Substance and Sexual Activity  ? Alcohol use: Yes  ?  Alcohol/week: 1.0  standard drink  ?  Types: 1 Standard drinks or equivalent per week  ?  Comment: glass of wine occas  ? Drug use: No  ? Sexual activity: Yes  ?  Partners: Male  ?  Birth control/protection: Post-menopausal  ?  Comment: TAH/BSO  ?Other Topics Concern  ? Not on file  ?Social History Narrative  ?  Not on file  ? ?Social Determinants of Health  ? ?Financial Resource Strain: Not on file  ?Food Insecurity: Not on file  ?Transportation Needs: Not on file  ?Physical Activity: Not on file  ?Stress: Not on file  ?Social Connections: Not on file  ? ?Allergies  ?Allergen Reactions  ? Oxycodone Hives  ?  '10mg'$  pink pill, allergic only to pink pills, if not pink she can take  ? Flexeril [Cyclobenzaprine] Hives  ? Red Dye Hives and Swelling  ? Adhesive [Tape] Other (See Comments)  ?  Blisters - Tolerates paper tape  ? Ciprofloxacin Hives  ? Crab [Shellfish Allergy] Swelling and Other (See Comments)  ?  Lip swellig with artificial crab meat  ? Etodolac   ? Povidone Iodine Hives, Swelling and Dermatitis  ?  Betadine  ? Pravastatin Hives and Other (See Comments)  ?  Tolerates atorvastatin  ? Purell Instant Hand [Homeopathic Products]   ? Tylenol [Acetaminophen] Hives and Other (See Comments)  ?  Makes her feel funny. Cannot take Tylenol in combination products (Percocet, Vicodin), but can take it alone ?Tolerates opiates individually except with red dye  ? Latex Hives and Other (See Comments)  ?  Blisters  ? Lisinopril Hives  ? Methocarbamol Hives  ? Morphine And Related Other (See Comments)  ?  Does not provide any pain relief  ? Sulfa Antibiotics Hives  ? ?Family History  ?Problem Relation Age of Onset  ? Hyperlipidemia Father   ? Hypertension Father   ? Heart attack Father 35  ?     deceased  ? Heart attack Brother 105  ? Breast cancer Mother   ? Colon cancer Neg Hx   ? Esophageal cancer Neg Hx   ? Stomach cancer Neg Hx   ? Rectal cancer Neg Hx   ? ? ?Current Outpatient Medications (Endocrine & Metabolic):  ?  SYNTHROID 75 MCG tablet,   ? ?Current Outpatient Medications (Cardiovascular):  ?  atorvastatin (LIPITOR) 20 MG tablet, Take 20 mg by mouth at bedtime.  ?  carvedilol (COREG) 6.25 MG tablet, Take 6.25 mg by mouth 2 (two) times dai

## 2022-03-03 ENCOUNTER — Ambulatory Visit (INDEPENDENT_AMBULATORY_CARE_PROVIDER_SITE_OTHER): Payer: Medicare Other | Admitting: Family Medicine

## 2022-03-03 ENCOUNTER — Ambulatory Visit (INDEPENDENT_AMBULATORY_CARE_PROVIDER_SITE_OTHER): Payer: Medicare Other

## 2022-03-03 VITALS — BP 124/70 | HR 73 | Ht 61.0 in | Wt 199.0 lb

## 2022-03-03 DIAGNOSIS — M810 Age-related osteoporosis without current pathological fracture: Secondary | ICD-10-CM

## 2022-03-03 DIAGNOSIS — M40204 Unspecified kyphosis, thoracic region: Secondary | ICD-10-CM | POA: Diagnosis not present

## 2022-03-03 DIAGNOSIS — M546 Pain in thoracic spine: Secondary | ICD-10-CM | POA: Diagnosis not present

## 2022-03-03 DIAGNOSIS — M549 Dorsalgia, unspecified: Secondary | ICD-10-CM | POA: Diagnosis not present

## 2022-03-03 DIAGNOSIS — M545 Low back pain, unspecified: Secondary | ICD-10-CM | POA: Diagnosis not present

## 2022-03-03 DIAGNOSIS — R200 Anesthesia of skin: Secondary | ICD-10-CM | POA: Diagnosis not present

## 2022-03-03 DIAGNOSIS — M2578 Osteophyte, vertebrae: Secondary | ICD-10-CM | POA: Diagnosis not present

## 2022-03-03 DIAGNOSIS — M5442 Lumbago with sciatica, left side: Secondary | ICD-10-CM

## 2022-03-03 DIAGNOSIS — M47812 Spondylosis without myelopathy or radiculopathy, cervical region: Secondary | ICD-10-CM | POA: Diagnosis not present

## 2022-03-03 DIAGNOSIS — G8929 Other chronic pain: Secondary | ICD-10-CM

## 2022-03-03 DIAGNOSIS — M5441 Lumbago with sciatica, right side: Secondary | ICD-10-CM | POA: Diagnosis not present

## 2022-03-03 MED ORDER — AMBULATORY NON FORMULARY MEDICATION: 1.0000 [IU] | Freq: Once | 0 refills | Status: AC

## 2022-03-03 NOTE — Patient Instructions (Addendum)
Xrays today ?Bone density ?PT [facility] ?See you again in 2 months ?

## 2022-03-04 ENCOUNTER — Ambulatory Visit: Payer: Medicare Other | Attending: Family Medicine

## 2022-03-04 DIAGNOSIS — M545 Low back pain, unspecified: Secondary | ICD-10-CM | POA: Insufficient documentation

## 2022-03-04 DIAGNOSIS — M549 Dorsalgia, unspecified: Secondary | ICD-10-CM | POA: Diagnosis not present

## 2022-03-04 DIAGNOSIS — R262 Difficulty in walking, not elsewhere classified: Secondary | ICD-10-CM | POA: Diagnosis present

## 2022-03-04 DIAGNOSIS — M5459 Other low back pain: Secondary | ICD-10-CM | POA: Insufficient documentation

## 2022-03-04 DIAGNOSIS — M6281 Muscle weakness (generalized): Secondary | ICD-10-CM | POA: Insufficient documentation

## 2022-03-04 DIAGNOSIS — G8929 Other chronic pain: Secondary | ICD-10-CM | POA: Insufficient documentation

## 2022-03-04 NOTE — Assessment & Plan Note (Signed)
Patient has had significant number of surgeries of the back.  We will get x-rays to further evaluate the amount of arthritic changes.  Concerned that patient has not also had other possibilities would like a bone density recently.  Make sure patient does not have any true compression fractures that are new.  Chronic kidney disease could potentially increase this.  Discussed over-the-counter natural supplementations that could be helpful.  Patient is on pain medications by other providers already including Dilaudid.  Discussed with patient about aquatic therapy for balance and coordination and patient is a very excited at the possibility.  We will refer her today.  Patient will try these interventions and come back and see me again in 2 months.  Depending on patient's response then can talk about other possible medications such as Cymbalta but will need to be careful with patient's allergies over the course of time ?

## 2022-03-04 NOTE — Therapy (Signed)
?OUTPATIENT PHYSICAL THERAPY THORACOLUMBAR EVALUATION ? ? ?Patient Name: [name] [name] ?MRN: [id number] ?DOB:1941/06/07, 81 y.o., female ?Today's Date: 03/04/2022 ? ? ? ?Past Medical History:  ?Diagnosis Date  ? Acquired left flat foot   ? Acquired leg length discrepancy   ? Allergic urticaria   ? Allergy   ? Anemia   ? took iron for a while  ? Arthritis   ? lower back, knees  ? Blood transfusion without reported diagnosis   ? Bronchitis   ? history only  ? Cataract   ? Chronic fatigue syndrome   ? Chronic pain   ? lower back  ? Contusion of left hand   ? Female bladder prolapse   ? Fracture of metatarsal bone   ? w nonunion   ? Gout   ? Hallux rigidus   ? History of measles   ? History of mumps   ? History of rubella   ? Hyperlipidemia   ? Hypertension   ? Hypothyroidism   ? Imbalance   ? Macular degeneration   ? wet- right eye  dry- left eye  ? 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HCC)   ? Metatarsalgia of left foot   ? Missed ab   ? x 6 - 2 surgery, 4 resolved on it's own  ? Neuromuscular disorder ([place])   ? , left foot has some numbeness- uses cane  ? Numbness   ? outer edge of heel left foot   ? Osteopenia   ? Pneumonia   ? PONV (postoperative nausea and vomiting)   ? states she had scop patch in past,states she sets off alarms when she is waking up, she "forgets to breathe"   ? Seasonal allergies   ? Shingles   ? Tinnitus   ? Trigger middle finger of left hand   ? Ulcer   ? hx  ? Urinary incontinence   ? ?Past Surgical History:  ?Procedure Laterality Date  ? ABDOMINAL HYSTERECTOMY Bilateral 10/07/2014  ? Procedure: TOTAL ABDOMINAL HYSTERECTOMY WITH BILATERAL SALPINGO OOPHORECTOMY AND HALBANS CULDOELASTY ;  Surgeon: Lyman Speller, MD;  Location: [facility];  Service: Gynecology;  Laterality: Bilateral;  Miller primary/Silva assist  4 hours total  ? ANTERIOR AND POSTERIOR REPAIR N/A 10/07/2014  ? Procedure: ANTERIOR (CYSTOCELE) AND POSTERIOR REPAIR (RECTOCELE);  Surgeon: Lyman Speller, MD;  Location: [facility];   Service: Gynecology;  Laterality: N/A;  ? APPENDECTOMY    ? BACK SURGERY  09,00,10,99, 6/14,9/14  ? x6 surgeries on back -Spinal Fusin L4-L5, Lumbar Fusion L1-S1  ? BLADDER SUSPENSION N/A 10/07/2014  ? Procedure: TRANSVAGINAL TAPE (TVT) EXACT PROCEDURE;  Surgeon: Lyman Speller, MD;  Location: [facility];  Service: Gynecology;  Laterality: N/A;  ? BREAST EXCISIONAL BIOPSY Left 1998  ? benign  ? BREAST SURGERY    ? left cyst- benign  ? BUNIONECTOMY  03/02/11 and 11/04/11  ? left foot x2  ? CARPAL TUNNEL RELEASE    ? both  ? CATARACT EXTRACTION Left 08/03/2019  ? CATARACT EXTRACTION Right 08/03/2019  ? CYSTOSCOPY N/A 10/07/2014  ? Procedure: CYSTOSCOPY;  Surgeon: Lyman Speller, MD;  Location: [facility];  Service: Gynecology;  Laterality: N/A;  ? DILATION AND CURETTAGE OF UTERUS    ? x2  ? Eye injections Left   ? HAMMER TOE SURGERY  4/12  ? lt foot-2-3  ? I&D and spina abscess L4-5    ? JOINT REPLACEMENT    ? R- TKA- 2013  ? KNEE ARTHROSCOPY  2007  ? both  ? LUMBAR LAMINECTOMY  05/1999  ? L4-L5  ? METATARSAL OSTEOTOMY  3/12  ? lt  ? ORIF TOE FRACTURE  11/04/2011  ? Procedure: OPEN REDUCTION INTERNAL FIXATION (ORIF) METATARSAL (TOE) FRACTURE;  Surgeon: Wylene Simmer, MD;  Location: [facility];  Service: Orthopedics;  Laterality: Left;  left revision orif 1st metatarsal with autograft from left calcaneous and left 2nd-3rd metatarsal weil osteotomy  ? osteoma  12  ? forehead x2  ? OTHER SURGICAL HISTORY  05/08/13  ? hardware removed from left foot  ? removal of two osteomas    ? on forehead-2008  ? STERIOD INJECTION Left 11/04/2015  ? Procedure: STEROID INJECTION;  Surgeon: Gaynelle Arabian, MD;  Location: [facility];  Service: Orthopedics;  Laterality: Left;  ? THUMB FUSION  2008  ? rt  ? TOTAL KNEE ARTHROPLASTY  08/29/2012  ? Procedure: TOTAL KNEE ARTHROPLASTY;  Surgeon: Hessie Dibble, MD;  Location: [facility];  Service: Orthopedics;  Laterality: Right;  ? TOTAL KNEE ARTHROPLASTY Left 05/17/2016  ? Procedure: LEFT  TOTAL KNEE ARTHROPLASTY;  Surgeon: Gaynelle Arabian, MD;  Location: [facility];  Service: Orthopedics;  Laterality: Left;  ? TOTAL KNEE ARTHROPLASTY Right   ? TOTAL KNEE REVISION Right 11/04/2015  ? Procedure: TOTAL KNEE ARTHROPLASTY REVISION;  Surgeon: Gaynelle Arabian, MD;  [facility];  Service: Orthopedics;  Laterality: Right;  ? TOTAL SHOULDER ARTHROPLASTY Right 05/09/2014  ? Procedure: TOTAL SHOULDER ARTHROPLASTY;  Surgeon: Nita Sells, MD;  Location: [facility];  Service: Orthopedics;  Laterality: Right;  Right shoulder arthroplasty  ? TUBAL LIGATION    ? ?Patient Active Problem List  ? Diagnosis Date Noted  ? Chronic low back pain 03/04/2022  ? Chronic kidney disease, stage 3b ([place]) 02/03/2022  ? History of total knee replacement, right 03/05/2018  ? Hypokalemia 05/19/2016  ? Sinus tachycardia 05/19/2016  ? OA (osteoarthritis) of knee 05/17/2016  ? Failed total right knee replacement ([place]) 11/04/2015  ? Failed total knee, right ([place]) 11/04/2015  ? S/P TAH (total abdominal hysterectomy) 10/08/2014  ? Pelvic prolapse 10/07/2014  ? Cystocele 08/16/2014  ? Glenohumeral arthritis 05/09/2014  ? Right knee DJD 08/29/2012  ?  Class: Chronic  ? ? ?PCP: [name], MD ? ?REFERRING PROVIDER: Lyndal Pulley, DO ? ?REFERRING DIAG: M54.9 (ICD-10-CM) - Back pain, unspecified back location, unspecified back pain laterality, unspecified chronicity  ? ?THERAPY DIAG:  ?Other low back pain ? ?Muscle weakness (generalized) ? ?Difficulty in walking, not elsewhere classified ? ?ONSET DATE: 03/03/22 ? ?SUBJECTIVE:                                                                                                                                                                                          ? ?  SUBJECTIVE STATEMENT: ?[name] [name] is a 81 y.o. female coming in with complaint of back pain. Back surgery in 2014. Constant pain, 6 back surgeries total. She states she does not want to have any other back surgeries so she is  doing the most conservative approach. Depends on the day where the pain is. LBP and between shoulder blades and going down. 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 Radiating pain and numbness down left. Loss of sensation in left foot.  Cannot take tylenol, can take advil. Will take an Advil PM. Heat and ice, but doesn't really help. Also tried tens and doesn't work. ?Patient has had also failed knee replacement previously. ? ?PERTINENT HISTORY:  ?6 lumbar surgeries ?Failed TKA ?Multiple medication allergies ? ?PAIN:  ?Are you having pain? Yes: Pain location: low back 5/10 ?Pain description: aching ?Aggravating factors: standing, sitting too long ?Relieving factors: rest ? ? ?PRECAUTIONS: Fall ? ?WEIGHT BEARING RESTRICTIONS No ? ?FALLS:  ?Has patient fallen in last 6 months? No ? ?LIVING ENVIRONMENT: ?Lives with: lives with their spouse ?Lives in: House/apartment ?Stairs: Yes: External: 4 steps; on right going up ?Has following equipment at home: Single point cane but is ordering a rollator walker ? ?OCCUPATION: Retired ? ?PLOF: Independent ? ?PATIENT GOALS To be able to balance better so that she is steadier out walking ? ? ?OBJECTIVE:  ? ?DIAGNOSTIC FINDINGS:  ?Xrays 03-03-22 but results not posted yet (images available) patient with multilevel fusion ? ?PATIENT SURVEYS:  ?FOTO NEED TO DO NEXT VISIT ? ?SCREENING FOR RED FLAGS: ?Bowel or bladder incontinence: No ?Spinal tumors: No ?Cauda equina syndrome: No ?Compression fracture: No ?Abdominal aneurysm: No ? ?COGNITION: ? Overall cognitive status: Within functional limits for tasks assessed   ?  ?SENSATION: ?WFL ? ? ?POSTURE:  ?Trunk shift to right ? ? ?LUMBAR ROM:  ? ?Active  A/PROM  ?03/04/2022  ?Flexion WNL  ?Extension WNL  ?Right lateral flexion WNL  ?Left lateral flexion WNL  ?Right rotation WNL  ?Left rotation WNL  ? (Blank rows = not tested) ? ?LE ROM: ? ?WFL ? ?LE MMT: ? ?Generally 4+/5  throughout bilateral LE's ? ?LUMBAR SPECIAL TESTS:  ?Unable to tolerate ? ?FUNCTIONAL TESTS:  ?5 times sit to stand: 12 sec ? ?GAIT: ?Distance walked: 50 ?Assistive device utilized: Single point cane ?Level of

## 2022-03-05 ENCOUNTER — Other Ambulatory Visit: Payer: Medicare Other

## 2022-03-08 ENCOUNTER — Ambulatory Visit (INDEPENDENT_AMBULATORY_CARE_PROVIDER_SITE_OTHER)
Admission: RE | Admit: 2022-03-08 | Discharge: 2022-03-08 | Disposition: A | Discharge status: Home / Self Care | Payer: Medicare Other | Source: Ambulatory Visit | Attending: Family Medicine | Admitting: Family Medicine

## 2022-03-08 ENCOUNTER — Other Ambulatory Visit: Payer: Medicare Other

## 2022-03-08 DIAGNOSIS — H353132 Nonexudative age-related macular degeneration, bilateral, intermediate dry stage: Secondary | ICD-10-CM | POA: Diagnosis not present

## 2022-03-08 DIAGNOSIS — Z961 Presence of intraocular lens: Secondary | ICD-10-CM | POA: Diagnosis not present

## 2022-03-08 DIAGNOSIS — M810 Age-related osteoporosis without current pathological fracture: Secondary | ICD-10-CM

## 2022-03-08 DIAGNOSIS — H16223 Keratoconjunctivitis sicca, not specified as Sjogren's, bilateral: Secondary | ICD-10-CM | POA: Diagnosis not present

## 2022-03-08 DIAGNOSIS — H01029 Squamous blepharitis unspecified eye, unspecified eyelid: Secondary | ICD-10-CM | POA: Diagnosis not present

## 2022-03-09 DIAGNOSIS — L821 Other seborrheic keratosis: Secondary | ICD-10-CM | POA: Diagnosis not present

## 2022-03-09 DIAGNOSIS — L718 Other rosacea: Secondary | ICD-10-CM | POA: Diagnosis not present

## 2022-03-09 DIAGNOSIS — H353221 Exudative age-related macular degeneration, left eye, with active choroidal neovascularization: Secondary | ICD-10-CM | POA: Diagnosis not present

## 2022-03-09 DIAGNOSIS — H35433 Paving stone degeneration of retina, bilateral: Secondary | ICD-10-CM | POA: Diagnosis not present

## 2022-03-09 DIAGNOSIS — H353212 Exudative age-related macular degeneration, right eye, with inactive choroidal neovascularization: Secondary | ICD-10-CM | POA: Diagnosis not present

## 2022-03-09 DIAGNOSIS — D1801 Hemangioma of skin and subcutaneous tissue: Secondary | ICD-10-CM | POA: Diagnosis not present

## 2022-03-09 DIAGNOSIS — L720 Epidermal cyst: Secondary | ICD-10-CM | POA: Diagnosis not present

## 2022-03-09 DIAGNOSIS — H43813 Vitreous degeneration, bilateral: Secondary | ICD-10-CM | POA: Diagnosis not present

## 2022-03-16 ENCOUNTER — Other Ambulatory Visit: Payer: Self-pay

## 2022-03-16 MED ORDER — AMBULATORY NON FORMULARY MEDICATION: 1.0000 [IU] | Freq: Once | 0 refills | Status: AC

## 2022-03-17 DIAGNOSIS — H353221 Exudative age-related macular degeneration, left eye, with active choroidal neovascularization: Secondary | ICD-10-CM | POA: Diagnosis not present

## 2022-03-17 DIAGNOSIS — H353212 Exudative age-related macular degeneration, right eye, with inactive choroidal neovascularization: Secondary | ICD-10-CM | POA: Diagnosis not present

## 2022-03-29 DIAGNOSIS — Z79891 Long term (current) use of opiate analgesic: Secondary | ICD-10-CM | POA: Diagnosis not present

## 2022-03-29 DIAGNOSIS — M47818 Spondylosis without myelopathy or radiculopathy, sacral and sacrococcygeal region: Secondary | ICD-10-CM | POA: Diagnosis not present

## 2022-03-29 DIAGNOSIS — M961 Postlaminectomy syndrome, not elsewhere classified: Secondary | ICD-10-CM | POA: Diagnosis not present

## 2022-03-29 DIAGNOSIS — G894 Chronic pain syndrome: Secondary | ICD-10-CM | POA: Diagnosis not present

## 2022-04-05 DIAGNOSIS — H16223 Keratoconjunctivitis sicca, not specified as Sjogren's, bilateral: Secondary | ICD-10-CM | POA: Diagnosis not present

## 2022-04-29 NOTE — Progress Notes (Signed)
[place] Phone: [phone] Subjective:   [name] [name], am serving as a scribe for Dr. Hulan Saas.   I'm seeing this patient by the request  of:  [name], MD  CC: back pain   [id number]  03/03/2022 Patient has had significant number of surgeries of the back.  We will get x-rays to further evaluate the amount of arthritic changes.  Concerned that patient has not also had other possibilities would like a bone density recently.  Make sure patient does not have any true compression fractures that are new.  Chronic kidney disease could potentially increase this.  Discussed over-the-counter natural supplementations that could be helpful.  Patient is on pain medications by other providers already including Dilaudid.  Discussed with patient about aquatic therapy for balance and coordination and patient is a very excited at the possibility.  We will refer her today.  Patient will try these interventions and come back and see me again in 2 months.  Depending on patient's response then can talk about other possible medications such as Cymbalta but will need to be careful with patient's allergies over the course of time  Updated 05/04/2022 [name] [name] is a 81 y.o. female coming in with complaint of LBP. Had Bone density done. Back pain is the same as last visit. Unable to begin aquatic therapy.   Xray IMPRESSION: 1. Postsurgical changes of prior L1-S5 PLIF with interbody grafts at each level and an interbody cage at L4-L5. [name] evidence of hardware complication. 2. Interval development of significant adjacent level degenerative disc disease at T12-L1. 3. More mild adjacent level degenerative changes also visualized at T10-T11 and T11-T12.  IMPRESSION: Cervical spondylosis with loss of disc space height, endplate osteophyte formation and uncovertebral joint hypertrophy at C5-C6 and  C6-C7.  IMPRESSION: Exaggerated thoracic kyphosis with adjacent level degenerative disc disease at T12-T11.   Partially imaged lumbosacral interbody fusion.   Changes of degenerative disc disease noted throughout the majority of the thoracic spine. [name] evidence of acute fracture.     Past Medical History:  Diagnosis Date   Acquired left flat foot    Acquired leg length discrepancy    Allergic urticaria    Allergy    Anemia    took iron for a while   Arthritis    lower back, knees   Blood transfusion without reported diagnosis    Bronchitis    history only   Cataract    Chronic fatigue syndrome    Chronic pain    lower back   Contusion of left hand    Female bladder prolapse    Fracture of metatarsal bone    w nonunion    Gout    Hallux rigidus    History of measles    History of mumps    History of rubella    Hyperlipidemia    Hypertension    Hypothyroidism    Imbalance    Macular degeneration    wet- right eye  dry- left eye   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HCC)    Metatarsalgia of left foot    Missed ab    x 6 - 2 surgery, 4 resolved on it's own   Neuromuscular disorder ([place])    , left foot has some numbeness- uses cane   Numbness    outer edge of heel left foot    Osteopenia    Pneumonia    PONV (  postoperative nausea and vomiting)    states she had scop patch in past,states she sets off alarms when she is waking up, she "forgets to breathe"    Seasonal allergies    Shingles    Tinnitus    Trigger middle finger of left hand    Ulcer    hx   Urinary incontinence    Past Surgical History:  Procedure Laterality Date   ABDOMINAL HYSTERECTOMY Bilateral 10/07/2014   Procedure: TOTAL ABDOMINAL HYSTERECTOMY WITH BILATERAL SALPINGO OOPHORECTOMY AND HALBANS CULDOELASTY ;  Surgeon: Lyman Speller, MD;  Location: [facility];  Service: Gynecology;  Laterality: Bilateral;  Miller primary/Silva assist  4 hours total   ANTERIOR AND POSTERIOR REPAIR N/A  10/07/2014   Procedure: ANTERIOR (CYSTOCELE) AND POSTERIOR REPAIR (RECTOCELE);  Surgeon: Lyman Speller, MD;  Location: [facility];  Service: Gynecology;  Laterality: N/A;   APPENDECTOMY     BACK SURGERY  09,00,10,99, 6/14,9/14   x6 surgeries on back -Spinal Fusin L4-L5, Lumbar Fusion L1-S1   BLADDER SUSPENSION N/A 10/07/2014   Procedure: TRANSVAGINAL TAPE (TVT) EXACT PROCEDURE;  Surgeon: Lyman Speller, MD;  Location: [facility];  Service: Gynecology;  Laterality: N/A;   BREAST EXCISIONAL BIOPSY Left 1998   benign   BREAST SURGERY     left cyst- benign   BUNIONECTOMY  03/02/11 and 11/04/11   left foot x2   CARPAL TUNNEL RELEASE     both   CATARACT EXTRACTION Left 08/03/2019   CATARACT EXTRACTION Right 08/03/2019   CYSTOSCOPY N/A 10/07/2014   Procedure: CYSTOSCOPY;  Surgeon: Lyman Speller, MD;  Location: [facility];  Service: Gynecology;  Laterality: N/A;   DILATION AND CURETTAGE OF UTERUS     x2   Eye injections Left    HAMMER TOE SURGERY  4/12   lt foot-2-3   I&D and spina abscess L4-5     JOINT REPLACEMENT     R- TKA- 2013   KNEE ARTHROSCOPY  2007   both   LUMBAR LAMINECTOMY  05/1999   L4-L5   METATARSAL OSTEOTOMY  3/12   lt   ORIF TOE FRACTURE  11/04/2011   Procedure: OPEN REDUCTION INTERNAL FIXATION (ORIF) METATARSAL (TOE) FRACTURE;  Surgeon: Wylene Simmer, MD;  Location: [facility];  Service: Orthopedics;  Laterality: Left;  left revision orif 1st metatarsal with autograft from left calcaneous and left 2nd-3rd metatarsal weil osteotomy   osteoma  12   forehead x2   OTHER SURGICAL HISTORY  05/08/13   hardware removed from left foot   removal of two osteomas     on forehead-2008   STERIOD INJECTION Left 11/04/2015   Procedure: STEROID INJECTION;  Surgeon: Gaynelle Arabian, MD;  [facility];  Service: Orthopedics;  Laterality: Left;   THUMB FUSION  2008   rt   TOTAL KNEE ARTHROPLASTY  08/29/2012   Procedure: TOTAL KNEE ARTHROPLASTY;  Surgeon: Hessie Dibble, MD;  Location: [facility];  Service: Orthopedics;  Laterality: Right;   TOTAL KNEE ARTHROPLASTY Left 05/17/2016   Procedure: LEFT TOTAL KNEE ARTHROPLASTY;  Surgeon: Gaynelle Arabian, MD;  [facility];  Service: Orthopedics;  Laterality: Left;   TOTAL KNEE ARTHROPLASTY Right    TOTAL KNEE REVISION Right 11/04/2015   Procedure: TOTAL KNEE ARTHROPLASTY REVISION;  Surgeon: Gaynelle Arabian, MD;  [facility];  Service: Orthopedics;  Laterality: Right;   TOTAL SHOULDER ARTHROPLASTY Right 05/09/2014   Procedure: TOTAL SHOULDER ARTHROPLASTY;  Surgeon: Nita Sells, MD;  Location: [facility];  Service: Orthopedics;  Laterality: Right;  Right shoulder arthroplasty   TUBAL LIGATION     Social History   Socioeconomic History   Marital status: Married    Spouse name: Not on file   Number of children: Not on file   Years of education: Not on file   Highest education level: Not on file  Occupational History   Not on file  Tobacco Use   Smoking status: Never   Smokeless tobacco: Never  Vaping Use   Vaping Use: Never used  Substance and Sexual Activity   Alcohol use: Yes    Alcohol/week: 1.0 standard drink    Types: 1 Standard drinks or equivalent per week    Comment: glass of wine occas   Drug use: [name]   Sexual activity: Yes    Partners: Male    Birth control/protection: Post-menopausal    Comment: TAH/BSO  Other Topics Concern   Not on file  Social History Narrative   Not on file   Social Determinants of Health   Financial Resource Strain: Not on file  Food Insecurity: Not on file  Transportation Needs: Not on file  Physical Activity: Not on file  Stress: Not on file  Social Connections: Not on file   Allergies  Allergen Reactions   Oxycodone Hives    '10mg'$  pink pill, allergic only to pink pills, if not pink she can take   Flexeril [Cyclobenzaprine] Hives   Red Dye Hives and Swelling   Adhesive [Tape] Other (See Comments)    Blisters - Tolerates paper tape    Ciprofloxacin Hives   Crab [Shellfish Allergy] Swelling and Other (See Comments)    Lip swellig with artificial crab meat   Etodolac    Povidone Iodine Hives, Swelling and Dermatitis    Betadine   Pravastatin Hives and Other (See Comments)    Tolerates atorvastatin   Purell Instant Hand [Homeopathic Products]    Tylenol [Acetaminophen] Hives and Other (See Comments)    Makes her feel funny. Cannot take Tylenol in combination products (Percocet, Vicodin), but can take it alone Tolerates opiates individually except with red dye   Latex Hives and Other (See Comments)    Blisters   Lisinopril Hives   Methocarbamol Hives   Morphine And Related Other (See Comments)    Does not provide any pain relief   Sulfa Antibiotics Hives   Family History  Problem Relation Age of Onset   Hyperlipidemia Father    Hypertension Father    Heart attack Father 61       deceased   Heart attack Brother 65   Breast cancer Mother    Colon cancer Neg Hx    Esophageal cancer Neg Hx    Stomach cancer Neg Hx    Rectal cancer Neg Hx     Current Outpatient Medications (Endocrine & Metabolic):    SYNTHROID 75 MCG tablet,   Current Outpatient Medications (Cardiovascular):    atorvastatin (LIPITOR) 20 MG tablet, Take 20 mg by mouth at bedtime.    carvedilol (COREG) 6.25 MG tablet, Take 6.25 mg by mouth 2 (two) times daily with a meal.    furosemide (LASIX) 40 MG tablet, Take 40 mg by mouth daily before supper.  Current Outpatient Medications (Respiratory):    loratadine (CLARITIN) 10 MG tablet, Take 10 mg by mouth daily as needed for allergies.  Current Outpatient Medications (Analgesics):    HYDROmorphone (DILAUDID) 2 MG tablet,    methadone (DOLOPHINE) 5 MG tablet, Take 5 mg by  mouth every 8 (eight) hours.    ASPIRIN PO*, Take 81 mg by mouth daily.  Current Outpatient Medications (Hematological):    ASPIRIN PO*, Take 81 mg by mouth daily.  Current Outpatient Medications (Other):    Ascorbic Acid  (VITAMIN C PO), Take 1,000 mg by mouth daily.   BIOTIN PO, Take 5,000 mg by mouth 2 (two) times a week.   Calcium-Magnesium-Vitamin D (CALCIUM 1200+D3 PO), Take 1 tablet by mouth 2 (two) times daily.   Flaxseed, Linseed, (FLAX SEEDS PO), Take 2,000 mg by mouth daily.   gabapentin (NEURONTIN) 300 MG capsule, Take 2 capsules by mouth 3 (three) times daily.   Multiple Vitamins-Minerals (MULTIVITAMIN PO), Take by mouth daily.   Multiple Vitamins-Minerals (PRESERVISION AREDS PO), Take by mouth.   nystatin-triamcinolone ointment (MYCOLOG), Apply topically twice daily for up to 5 days.   senna (SENOKOT) 8.6 MG tablet, Take 2-3 tablets by mouth 2 (two) times daily. Take two tablets in the morning and three tablets in the evening.   tiZANidine (ZANAFLEX) 4 MG tablet, Take 1 tablet by mouth 3 (three) times daily as needed. * These medications belong to multiple therapeutic classes and are listed under each applicable group.   Reviewed prior external information including notes and imaging from  primary care provider As well as notes that were available from care everywhere and other healthcare systems.  Past medical history, social, surgical and family history all reviewed in electronic medical record.  [name] pertanent information unless stated regarding to the chief complaint.   Review of Systems:  [name] headache, visual changes, nausea, vomiting, diarrhea, constipation, dizziness, abdominal pain, skin rash, fevers, chills, night sweats, weight loss, swollen lymph nodes, body aches, joint swelling, chest pain, shortness of breath, mood changes. POSITIVE muscle aches  Objective  Blood pressure 120/62, pulse 68, height '5\' 1"'$  (1.549 m), weight 198 lb (89.8 kg), last menstrual period 12/06/2000, SpO2 97 %.   General: [name] apparent distress alert and oriented x3 mood and affect normal, dressed appropriately.  HEENT: Pupils equal, extraocular movements intact  Respiratory: Patient's speak in full sentences and  does not appear short of breath  Cardiovascular: [name] lower extremity edema, non tender, [name] erythema  Gait antalgic MSK: Severe arthritic changes of multiple joints.  Patient's back exam does have loss of lordosis of the lumbar spine.  Difficulty with Corky Sox secondary to loss of range of motion.  Does seem to be neurovascularly intact distally. [name] significant extension of the back.    Impression and Recommendations:     The above documentation has been reviewed and is accurate and complete Lyndal Pulley, DO

## 2022-05-04 ENCOUNTER — Ambulatory Visit (INDEPENDENT_AMBULATORY_CARE_PROVIDER_SITE_OTHER): Payer: Medicare Other | Admitting: Family Medicine

## 2022-05-04 VITALS — BP 120/62 | HR 68 | Ht 61.0 in | Wt 198.0 lb

## 2022-05-04 DIAGNOSIS — M5441 Lumbago with sciatica, right side: Secondary | ICD-10-CM

## 2022-05-04 DIAGNOSIS — G8929 Other chronic pain: Secondary | ICD-10-CM

## 2022-05-04 DIAGNOSIS — M5442 Lumbago with sciatica, left side: Secondary | ICD-10-CM

## 2022-05-04 NOTE — Patient Instructions (Signed)
Aquatic PT Injections if we need them See me in 2 months

## 2022-05-04 NOTE — Assessment & Plan Note (Addendum)
No improvement, non compliant Known arthritic changes patient's MRI did show the patient does have unfortunately increasing segment disease at the T12-L1 area.  Discussed with patient about icing regimen and home exercises, discussed which activities to do and which ones to avoid, increase activity slowly otherwise.  Follow-up with me again in 6 to 8 weeks.

## 2022-05-06 NOTE — Therapy (Signed)
OUTPATIENT PHYSICAL THERAPY THORACOLUMBAR EVALUATION   Patient Name: [name] [name] MRN: [id number] DOB:[birth year], 81 y.o., female Today's Date: 05/07/2022   PT End of Session - 05/07/22 1113     Visit Number 1    Number of Visits 12    Date for PT Re-Evaluation 06/18/22    Authorization Type Medicare A and B    PT Start Time 1023    PT Stop Time 1109    PT Time Calculation (min) 46 min    Activity Tolerance Patient tolerated treatment well    Behavior During Therapy WFL for tasks assessed/performed             Past Medical History:  Diagnosis Date   Acquired left flat foot    Acquired leg length discrepancy    Allergic urticaria    Allergy    Anemia    took iron for a while   Arthritis    lower back, knees   Blood transfusion without reported diagnosis    Bronchitis    history only   Cataract    Chronic fatigue syndrome    Chronic pain    lower back   Contusion of left hand    Female bladder prolapse    Fracture of metatarsal bone    w nonunion    Gout    Hallux rigidus    History of measles    History of mumps    History of rubella    Hyperlipidemia    Hypertension    Hypothyroidism    Imbalance    Macular degeneration    wet- right eye  dry- left eye   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[place])    Metatarsalgia of left foot    Missed ab    x 6 - 2 surgery, 4 resolved on it's own   Neuromuscular disorder ([place])    , left foot has some numbeness- uses cane   Numbness    outer edge of heel left foot    Osteopenia    Pneumonia    PONV (postoperative nausea and vomiting)    states she had scop patch in past,states she sets off alarms when she is waking up, she "forgets to breathe"    Seasonal allergies    Shingles    Tinnitus    Trigger middle finger of left hand    Ulcer    hx   Urinary incontinence    Past Surgical History:  Procedure Laterality Date   ABDOMINAL HYSTERECTOMY Bilateral 10/07/2014   Procedure: TOTAL ABDOMINAL HYSTERECTOMY WITH  BILATERAL SALPINGO OOPHORECTOMY AND HALBANS CULDOELASTY ;  Surgeon: Lyman Speller, MD;  Location: [facility];  Service: Gynecology;  Laterality: Bilateral;  Miller primary/Silva assist  4 hours total   ANTERIOR AND POSTERIOR REPAIR N/A 10/07/2014   Procedure: ANTERIOR (CYSTOCELE) AND POSTERIOR REPAIR (RECTOCELE);  Surgeon: Lyman Speller, MD;  Location: [facility];  Service: Gynecology;  Laterality: N/A;   APPENDECTOMY     BACK SURGERY  09,00,10,99, 6/14,9/14   x6 surgeries on back -Spinal Fusin L4-L5, Lumbar Fusion L1-S1   BLADDER SUSPENSION N/A 10/07/2014   Procedure: TRANSVAGINAL TAPE (TVT) EXACT PROCEDURE;  Surgeon: Lyman Speller, MD;  Location: [facility];  Service: Gynecology;  Laterality: N/A;   BREAST EXCISIONAL BIOPSY Left 1998   benign   BREAST SURGERY     left cyst- benign   BUNIONECTOMY  03/02/11 and 11/04/11   left foot x2   CARPAL TUNNEL RELEASE     both  CATARACT EXTRACTION Left 08/03/2019   CATARACT EXTRACTION Right 08/03/2019   CYSTOSCOPY N/A 10/07/2014   Procedure: CYSTOSCOPY;  Surgeon: Lyman Speller, MD;  Location: [facility];  Service: Gynecology;  Laterality: N/A;   DILATION AND CURETTAGE OF UTERUS     x2   Eye injections Left    HAMMER TOE SURGERY  4/12   lt foot-2-3   I&D and spina abscess L4-5     JOINT REPLACEMENT     R- TKA- 2013   KNEE ARTHROSCOPY  2007   both   LUMBAR LAMINECTOMY  05/1999   L4-L5   METATARSAL OSTEOTOMY  3/12   lt   ORIF TOE FRACTURE  11/04/2011   Procedure: OPEN REDUCTION INTERNAL FIXATION (ORIF) METATARSAL (TOE) FRACTURE;  Surgeon: Wylene Simmer, MD;  Location: [facility];  Service: Orthopedics;  Laterality: Left;  left revision orif 1st metatarsal with autograft from left calcaneous and left 2nd-3rd metatarsal weil osteotomy   osteoma  12   forehead x2   OTHER SURGICAL HISTORY  05/08/13   hardware removed from left foot   removal of two osteomas     on forehead-2008   STERIOD INJECTION Left 11/04/2015   Procedure:  STEROID INJECTION;  Surgeon: Gaynelle Arabian, MD;  Location: [facility];  Service: Orthopedics;  Laterality: Left;   THUMB FUSION  2008   rt   TOTAL KNEE ARTHROPLASTY  08/29/2012   Procedure: TOTAL KNEE ARTHROPLASTY;  Surgeon: Hessie Dibble, MD;  Location: [facility];  Service: Orthopedics;  Laterality: Right;   TOTAL KNEE ARTHROPLASTY Left 05/17/2016   Procedure: LEFT TOTAL KNEE ARTHROPLASTY;  Surgeon: Gaynelle Arabian, MD;  Location: [facility];  Service: Orthopedics;  Laterality: Left;   TOTAL KNEE ARTHROPLASTY Right    TOTAL KNEE REVISION Right 11/04/2015   Procedure: TOTAL KNEE ARTHROPLASTY REVISION;  Surgeon: Gaynelle Arabian, MD;  Location: [facility];  Service: Orthopedics;  Laterality: Right;   TOTAL SHOULDER ARTHROPLASTY Right 05/09/2014   Procedure: TOTAL SHOULDER ARTHROPLASTY;  Surgeon: Nita Sells, MD;  Location: [facility];  Service: Orthopedics;  Laterality: Right;  Right shoulder arthroplasty   TUBAL LIGATION     Patient Active Problem List   Diagnosis Date Noted   Chronic low back pain 03/04/2022   Chronic kidney disease, stage 3b ([place]) 02/03/2022   History of total knee replacement, right 03/05/2018   Hypokalemia 05/19/2016   Sinus tachycardia 05/19/2016   OA (osteoarthritis) of knee 05/17/2016   Failed total right knee replacement ([place]) 11/04/2015   Failed total knee, right ([place]) 11/04/2015   S/P TAH (total abdominal hysterectomy) 10/08/2014   Pelvic prolapse 10/07/2014   Cystocele 08/16/2014   Glenohumeral arthritis 05/09/2014   Right knee DJD 08/29/2012    Class: Chronic    PCP: Tonna Boehringer, MD  REFERRING PROVIDER: Lyndal Pulley, DO   REFERRING DIAG: M54.42,M54.41,G89.29 (ICD-10-CM) - Chronic bilateral low back pain with bilateral sciatica   Rationale for Evaluation and Treatment Rehabilitation  THERAPY DIAG:  Other low back pain  Muscle weakness (generalized)  Difficulty in walking, not elsewhere classified  ONSET DATE: Chronic pain ; MD visit/order  05/04/2022   SUBJECTIVE:  SUBJECTIVE STATEMENT: Pt reports having back pain for > 20 years and a hx of 6 lumbar surgeries including laminectomy and fusions.  Her last surgery was a L1-S1 fusion in 2014.  Pt states she probably needs thoracic surgery but her and and her MD in agreement to not have surgery.  Pt saw MD who ordered PT for aquatic therapy.  Pt was evaluated by PT on 03/04/2022 and received HS stretching for HEP.  Pt states she had tried to call clinic and schedule twice though never received a call back.  Pt was trying to schedule PT when she was in town with her husband.  Her husband travels out of town for work.   Pt states she needs strength and balance.  Pt has difficulty standing up straight and bends fwd as she walks.  Pt uses her 3 wheeled walker when walking for distance.  She ambulates with a SPC otherwise unless in home.  Pt is limited with ambulation due to pain and weakness.  Pt able to work in her yard and garden though has pain with bending and standing straight.  Pt does have some knee pain with performing transfers.   PERTINENT HISTORY:  -Chronic back pain with x ray findings of significant DDD at T12-L1 and mild degenerative changes at T10-T11 and T11-T12 -Chronic fatigue syndrome, osteopenia, Cervical spondylosis, and Macular degeneratio -PSHx:  6 lumbar surgeries with L1-S1 fusion in 2014, bilat TKA, R TSA     PAIN:  Are you having pain? Yes NPRS:  Current: 4.5/10, Worst:  8/10, Best: 3.5-4/10 Location:  central lower lumbar ; Pt states she can have pain shooting down legs L > R occasionally Type: constant; aching; can be sharp at times    PRECAUTIONS: Other: L1-S1 fusion in 2014, degenerative thoracic changes, osteopenia, bilat TKA, R TSA  WEIGHT BEARING RESTRICTIONS  No  FALLS:  Has patient fallen in last 6 months? Yes. Number of falls 1; tripped over a large rock in her yard  LIVING ENVIRONMENT: Lives with: lives with their spouse Lives in: 1 level home Stairs: 4 steps to enter home with 1 rail Has following equipment at home: Single point cane and 3 wheeled walker  OCCUPATION: Pt is retired  PLOF: Independent; Pt has a Hx of lumbar pain. She has been using cane for 5-6 years.  PATIENT GOALS improve strength and balance.  Wants to be more steady on feet. To be able to stand straighter with ambulation   OBJECTIVE:   DIAGNOSTIC FINDINGS:  Xrays  IMPRESSION: 1. Postsurgical changes of prior PLIF with interbody grafts at each level and an interbody cage at L4-L5. No evidence of hardware complication. 2. Interval development of significant adjacent level degenerative disc disease at T12-L1. 3. More mild adjacent level degenerative changes also visualized at T10-T11 and T11-T12.  IMPRESSION: Exaggerated thoracic kyphosis with adjacent level degenerative disc disease at T12-T11.  Changes of degenerative disc disease noted throughout the majority of the thoracic spine. No evidence of acute fracture.  PATIENT SURVEYS:  Modified Oswestry 18%     COGNITION:  Overall cognitive status: Within functional limits for tasks assessed       MUSCLE LENGTH: Hamstrings: tigtness in bilat HS worse on L    LUMBAR ROM:   Active  AROM  eval  Flexion WFL  Extension   Right lateral flexion 75% with pain  Left lateral flexion WFL  Right rotation   Left rotation    (Blank rows = not tested)   LOWER EXTREMITY  MMT:    MMT Right eval Left eval  Hip flexion 4/5 4+/5  Hip extension    Hip abduction WFL tested in sitting WFL tested in sitting  Hip adduction    Hip internal rotation    Hip external rotation    Knee flexion 5/5 tested in sitting 5/5 tested in sitting  Knee extension 5/5 5/5  Ankle dorsiflexion 5/5 5/5  Ankle  plantarflexion WFL tested in sitting WFL tested in sitting  Ankle inversion    Ankle eversion     (Blank rows = not tested)  LUMBAR SPECIAL TESTS:  Supine SLR test: positive bilat  FUNCTIONAL TESTS:  5x STS:  16 sec without UE's.   GAIT: Distance walked: approx 120 ft Assistive device utilized: Single point cane Level of assistance: Complete Independence Comments: Pt ambulates with increased Wb'ing thru cane and has forward flexed posture the more she walks.  Pt had antalgic limp, increased fwd flexion, and lean to the L when ambulating without cane.     TODAY'S TREATMENT  See below for pt education   PATIENT EDUCATION:  Education details: Educated pt in objective findings, dx, prognosis, rationale of exercises, and POC.  Educated pt in aquatic process and the benefits and properties of aquatic therapy.   Person educated: Patient Education method: Explanation Education comprehension: verbalized understanding   HOME EXERCISE PROGRAM: Not given today.  Pt has HS stretches from prior PT eval.   ASSESSMENT:  CLINICAL IMPRESSION: Patient is an 81 y.o. female with a dx of LBP presenting to the clinic with c/o's of constant LBP, muscle weakness, and difficulty in walking.  Pt uses an AD for ambulation unless in home.  Pt is limited with ambulation due to pain and weakness and states she has difficulty standing up straight.  Pt demonstrates a forward flexed posture which worsens the more she walks and if she ambulates without an AD.  Pt able to work in her yard and garden though has pain with bending and standing straight.  Pt reports having deficits with balance.  Pt should benefit from skilled PT with focus on aquatic therapy to address impairments and to improve overall function.    OBJECTIVE IMPAIRMENTS Abnormal gait, decreased activity tolerance, decreased balance, decreased endurance, decreased mobility, difficulty walking, decreased ROM, decreased strength, impaired flexibility,  postural dysfunction, and pain.   ACTIVITY LIMITATIONS bending, standing, and locomotion level  PARTICIPATION LIMITATIONS: community activity and yard work/gardening  PERSONAL FACTORS Time since onset of injury/illness/exacerbation and 3+ comorbidities: Chronic fatigue syndrome, thoracic degenerative changes, bilat TKA, and multiple lumbar surgeries  are also affecting patient's functional outcome.   REHAB POTENTIAL: Fair due to chronic pain and multiple lumbar surgeries  CLINICAL DECISION MAKING: Evolving/moderate complexity  EVALUATION COMPLEXITY: Moderate   GOALS:  SHORT TERM GOALS:   Pt will tolerate aquatic therapy without adverse effects for improved pain, function, strength, and tolerance to activity.  Baseline: Goal status: INITIAL Target date:  05/21/2022  2.  Pt will report at least a 25% improvement in her posture with ambulation. Baseline:  Goal status: INITIAL Target Goal:  06/04/2022  3.  Pt will be independent with land based HEP for improved core strength, pain, and function.  Baseline:  Goal status: INITIAL Target Goal:  06/04/2022  4.  Pt will progress with aquatic exercises for improved core strength in order for improved posture with ambulation and standing activities.  Baseline:  Goal status: INITIAL Target goal:  06/04/2022   LONG TERM GOALS: Target date: 06/18/2022  Pt will report at least a 60% improvement in standing straighter with her daily activities, mobility, and ambulation.  Baseline:  Goal status: INITIAL  2.  Pt will report she is able to ambulate her normal community distance without significant pain.  Baseline:  Goal status: INITIAL  3.  Pt will report improved stability with her daily mobility and ambulation.  Baseline:  Goal status: INITIAL  4.  Pt will report at least a 50% improvement in pain with gardening and her normal yard work.  Baseline:  Goal status: INITIAL     PLAN: PT FREQUENCY: 2x/week  PT DURATION: 6  weeks  PLANNED INTERVENTIONS: Therapeutic exercises, Therapeutic activity, Neuromuscular re-education, Balance training, Gait training, Patient/Family education, Stair training, Aquatic Therapy, Dry Needling, Electrical stimulation, Cryotherapy, Moist heat, Taping, Manual therapy, and Re-evaluation.  PLAN FOR NEXT SESSION: 3-4 visits of aquatic therapy and then return on land for 1-2 visits to work on HEP.    Selinda Michaels III PT, DPT 05/07/22 3:38 PM

## 2022-05-07 ENCOUNTER — Encounter (HOSPITAL_BASED_OUTPATIENT_CLINIC_OR_DEPARTMENT_OTHER): Payer: Self-pay | Admitting: Physical Therapy

## 2022-05-07 ENCOUNTER — Ambulatory Visit (HOSPITAL_BASED_OUTPATIENT_CLINIC_OR_DEPARTMENT_OTHER): Payer: Medicare Other | Attending: Family Medicine | Admitting: Physical Therapy

## 2022-05-07 DIAGNOSIS — M5442 Lumbago with sciatica, left side: Secondary | ICD-10-CM | POA: Insufficient documentation

## 2022-05-07 DIAGNOSIS — M5441 Lumbago with sciatica, right side: Secondary | ICD-10-CM | POA: Diagnosis not present

## 2022-05-07 DIAGNOSIS — G8929 Other chronic pain: Secondary | ICD-10-CM | POA: Insufficient documentation

## 2022-05-07 DIAGNOSIS — M6281 Muscle weakness (generalized): Secondary | ICD-10-CM | POA: Insufficient documentation

## 2022-05-07 DIAGNOSIS — M5459 Other low back pain: Secondary | ICD-10-CM | POA: Diagnosis not present

## 2022-05-07 DIAGNOSIS — R262 Difficulty in walking, not elsewhere classified: Secondary | ICD-10-CM | POA: Diagnosis not present

## 2022-05-11 DIAGNOSIS — H353212 Exudative age-related macular degeneration, right eye, with inactive choroidal neovascularization: Secondary | ICD-10-CM | POA: Diagnosis not present

## 2022-05-11 DIAGNOSIS — H43813 Vitreous degeneration, bilateral: Secondary | ICD-10-CM | POA: Diagnosis not present

## 2022-05-11 DIAGNOSIS — H353221 Exudative age-related macular degeneration, left eye, with active choroidal neovascularization: Secondary | ICD-10-CM | POA: Diagnosis not present

## 2022-05-11 DIAGNOSIS — H35433 Paving stone degeneration of retina, bilateral: Secondary | ICD-10-CM | POA: Diagnosis not present

## 2022-05-12 ENCOUNTER — Ambulatory Visit (HOSPITAL_BASED_OUTPATIENT_CLINIC_OR_DEPARTMENT_OTHER): Payer: Medicare Other | Admitting: Physical Therapy

## 2022-05-18 ENCOUNTER — Encounter (HOSPITAL_BASED_OUTPATIENT_CLINIC_OR_DEPARTMENT_OTHER): Payer: Self-pay | Admitting: Physical Therapy

## 2022-05-18 ENCOUNTER — Ambulatory Visit (HOSPITAL_BASED_OUTPATIENT_CLINIC_OR_DEPARTMENT_OTHER): Payer: Medicare Other | Admitting: Physical Therapy

## 2022-05-18 DIAGNOSIS — M5441 Lumbago with sciatica, right side: Secondary | ICD-10-CM | POA: Diagnosis not present

## 2022-05-18 DIAGNOSIS — M5442 Lumbago with sciatica, left side: Secondary | ICD-10-CM | POA: Diagnosis not present

## 2022-05-18 DIAGNOSIS — G8929 Other chronic pain: Secondary | ICD-10-CM | POA: Diagnosis not present

## 2022-05-18 DIAGNOSIS — R262 Difficulty in walking, not elsewhere classified: Secondary | ICD-10-CM

## 2022-05-18 DIAGNOSIS — M6281 Muscle weakness (generalized): Secondary | ICD-10-CM

## 2022-05-18 DIAGNOSIS — M5459 Other low back pain: Secondary | ICD-10-CM

## 2022-05-18 NOTE — Therapy (Signed)
OUTPATIENT PHYSICAL THERAPY THORACOLUMBAR    Patient Name: [name] [name] MRN: [id number] DOB:01/18/1941, 81 y.o., female Today's Date: 05/18/2022   PT End of Session - 05/18/22 1405     Visit Number 2    Number of Visits 12    Date for PT Re-Evaluation 06/18/22    Authorization Type Medicare A and B    PT Start Time 1406    PT Stop Time 1445    PT Time Calculation (min) 39 min    Activity Tolerance Patient tolerated treatment well    Behavior During Therapy WFL for tasks assessed/performed             Past Medical History:  Diagnosis Date   Acquired left flat foot    Acquired leg length discrepancy    Allergic urticaria    Allergy    Anemia    took iron for a while   Arthritis    lower back, knees   Blood transfusion without reported diagnosis    Bronchitis    history only   Cataract    Chronic fatigue syndrome    Chronic pain    lower back   Contusion of left hand    Female bladder prolapse    Fracture of metatarsal bone    w nonunion    Gout    Hallux rigidus    History of measles    History of mumps    History of rubella    Hyperlipidemia    Hypertension    Hypothyroidism    Imbalance    Macular degeneration    wet- right eye  dry- left eye   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[place])    Metatarsalgia of left foot    Missed ab    x 6 - 2 surgery, 4 resolved on it's own   Neuromuscular disorder ([place])    , left foot has some numbeness- uses cane   Numbness    outer edge of heel left foot    Osteopenia    Pneumonia    PONV (postoperative nausea and vomiting)    states she had scop patch in past,states she sets off alarms when she is waking up, she "forgets to breathe"    Seasonal allergies    Shingles    Tinnitus    Trigger middle finger of left hand    Ulcer    hx   Urinary incontinence    Past Surgical History:  Procedure Laterality Date   ABDOMINAL HYSTERECTOMY Bilateral 10/07/2014   Procedure: TOTAL ABDOMINAL HYSTERECTOMY WITH  BILATERAL SALPINGO OOPHORECTOMY AND HALBANS CULDOELASTY ;  Surgeon: Lyman Speller, MD;  Location: [facility];  Service: Gynecology;  Laterality: Bilateral;  Miller primary/Silva assist  4 hours total   ANTERIOR AND POSTERIOR REPAIR N/A 10/07/2014   Procedure: ANTERIOR (CYSTOCELE) AND POSTERIOR REPAIR (RECTOCELE);  Surgeon: Lyman Speller, MD;  Location: [facility];  Service: Gynecology;  Laterality: N/A;   APPENDECTOMY     BACK SURGERY  09,00,10,99, 6/14,9/14   x6 surgeries on back -Spinal Fusin L4-L5, Lumbar Fusion L1-S1   BLADDER SUSPENSION N/A 10/07/2014   Procedure: TRANSVAGINAL TAPE (TVT) EXACT PROCEDURE;  Surgeon: Lyman Speller, MD;  Location: [facility];  Service: Gynecology;  Laterality: N/A;   BREAST EXCISIONAL BIOPSY Left 1998   benign   BREAST SURGERY     left cyst- benign   BUNIONECTOMY  03/02/11 and 11/04/11   left foot x2   CARPAL TUNNEL RELEASE     both  CATARACT EXTRACTION Left 08/03/2019   CATARACT EXTRACTION Right 08/03/2019   CYSTOSCOPY N/A 10/07/2014   Procedure: CYSTOSCOPY;  Surgeon: Lyman Speller, MD;  Location: [facility];  Service: Gynecology;  Laterality: N/A;   DILATION AND CURETTAGE OF UTERUS     x2   Eye injections Left    HAMMER TOE SURGERY  4/12   lt foot-2-3   I&D and spina abscess L4-5     JOINT REPLACEMENT     R- TKA- 2013   KNEE ARTHROSCOPY  2007   both   LUMBAR LAMINECTOMY  05/1999   L4-L5   METATARSAL OSTEOTOMY  3/12   lt   ORIF TOE FRACTURE  11/04/2011   Procedure: OPEN REDUCTION INTERNAL FIXATION (ORIF) METATARSAL (TOE) FRACTURE;  Surgeon: Wylene Simmer, MD;  Location: [facility];  Service: Orthopedics;  Laterality: Left;  left revision orif 1st metatarsal with autograft from left calcaneous and left 2nd-3rd metatarsal weil osteotomy   osteoma  12   forehead x2   OTHER SURGICAL HISTORY  05/08/13   hardware removed from left foot   removal of two osteomas     on forehead-2008   STERIOD INJECTION Left 11/04/2015   Procedure:  STEROID INJECTION;  Surgeon: Gaynelle Arabian, MD;  Location: [facility];  Service: Orthopedics;  Laterality: Left;   THUMB FUSION  2008   rt   TOTAL KNEE ARTHROPLASTY  08/29/2012   Procedure: TOTAL KNEE ARTHROPLASTY;  Surgeon: Hessie Dibble, MD;  Location: [facility];  Service: Orthopedics;  Laterality: Right;   TOTAL KNEE ARTHROPLASTY Left 05/17/2016   Procedure: LEFT TOTAL KNEE ARTHROPLASTY;  Surgeon: Gaynelle Arabian, MD;  Location: [facility];  Service: Orthopedics;  Laterality: Left;   TOTAL KNEE ARTHROPLASTY Right    TOTAL KNEE REVISION Right 11/04/2015   Procedure: TOTAL KNEE ARTHROPLASTY REVISION;  Surgeon: Gaynelle Arabian, MD;  Location: [facility];  Service: Orthopedics;  Laterality: Right;   TOTAL SHOULDER ARTHROPLASTY Right 05/09/2014   Procedure: TOTAL SHOULDER ARTHROPLASTY;  Surgeon: Nita Sells, MD;  Location: [facility];  Service: Orthopedics;  Laterality: Right;  Right shoulder arthroplasty   TUBAL LIGATION     Patient Active Problem List   Diagnosis Date Noted   Chronic low back pain 03/04/2022   Chronic kidney disease, stage 3b ([place]) 02/03/2022   History of total knee replacement, right 03/05/2018   Hypokalemia 05/19/2016   Sinus tachycardia 05/19/2016   OA (osteoarthritis) of knee 05/17/2016   Failed total right knee replacement ([place]) 11/04/2015   Failed total knee, right ([place]) 11/04/2015   S/P TAH (total abdominal hysterectomy) 10/08/2014   Pelvic prolapse 10/07/2014   Cystocele 08/16/2014   Glenohumeral arthritis 05/09/2014   Right knee DJD 08/29/2012    Class: Chronic    PCP: Tonna Boehringer, MD  REFERRING PROVIDER: Lyndal Pulley, DO   REFERRING DIAG: M54.42,M54.41,G89.29 (ICD-10-CM) - Chronic bilateral low back pain with bilateral sciatica   Rationale for Evaluation and Treatment Rehabilitation  THERAPY DIAG:  Other low back pain  Muscle weakness (generalized)  Difficulty in walking, not elsewhere classified  ONSET DATE: Chronic pain ; MD visit/order  05/04/2022   SUBJECTIVE:        Back pain about normal 5/10  SUBJECTIVE STATEMENT: Pt reports having back pain for > 20 years and a hx of 6 lumbar surgeries including laminectomy and fusions.  Her last surgery was a L1-S1 fusion in 2014.  Pt states she probably needs thoracic surgery but her and and her MD in agreement to not have surgery.  Pt saw MD who ordered PT for aquatic therapy.  Pt was evaluated by PT on 03/04/2022 and received HS stretching for HEP.  Pt states she had tried to call clinic and schedule twice though never received a call back.  Pt was trying to schedule PT when she was in town with her husband.  Her husband travels out of town for work.   Pt states she needs strength and balance.  Pt has difficulty standing up straight and bends fwd as she walks.  Pt uses her 3 wheeled walker when walking for distance.  She ambulates with a SPC otherwise unless in home.  Pt is limited with ambulation due to pain and weakness.  Pt able to work in her yard and garden though has pain with bending and standing straight.  Pt does have some knee pain with performing transfers.   PERTINENT HISTORY:  -Chronic back pain with x ray findings of significant DDD at T12-L1 and mild degenerative changes at T10-T11 and T11-T12 -Chronic fatigue syndrome, osteopenia, Cervical spondylosis, and Macular degeneratio -PSHx:  6 lumbar surgeries with L1-S1 fusion in 2014, bilat TKA, R TSA     PAIN:  Are you having pain? Yes NPRS:  Current: 4.5/10, Worst:  8/10, Best: 3.5-4/10 Location:  central lower lumbar ; Pt states she can have pain shooting down legs L > R occasionally Type: constant; aching; can be sharp at times    PRECAUTIONS: Other: L1-S1 fusion in 2014, degenerative thoracic changes, osteopenia, bilat TKA, R TSA  WEIGHT  BEARING RESTRICTIONS No  FALLS:  Has patient fallen in last 6 months? Yes. Number of falls 1; tripped over a large rock in her yard  LIVING ENVIRONMENT: Lives with: lives with their spouse Lives in: 1 level home Stairs: 4 steps to enter home with 1 rail Has following equipment at home: Single point cane and 3 wheeled walker  OCCUPATION: Pt is retired  PLOF: Independent; Pt has a Hx of lumbar pain. She has been using cane for 5-6 years.  PATIENT GOALS improve strength and balance.  Wants to be more steady on feet. To be able to stand straighter with ambulation   OBJECTIVE:   DIAGNOSTIC FINDINGS:  Xrays  IMPRESSION: 1. Postsurgical changes of prior PLIF with interbody grafts at each level and an interbody cage at L4-L5. No evidence of hardware complication. 2. Interval development of significant adjacent level degenerative disc disease at T12-L1. 3. More mild adjacent level degenerative changes also visualized at T10-T11 and T11-T12.  IMPRESSION: Exaggerated thoracic kyphosis with adjacent level degenerative disc disease at T12-T11.  Changes of degenerative disc disease noted throughout the majority of the thoracic spine. No evidence of acute fracture.  PATIENT SURVEYS:  Modified Oswestry 18%     COGNITION:  Overall cognitive status: Within functional limits for tasks assessed       MUSCLE LENGTH: Hamstrings: tigtness in bilat HS worse on L    LUMBAR ROM:   Active  AROM  eval  Flexion WFL  Extension   Right lateral flexion 75% with pain  Left lateral flexion WFL  Right rotation   Left rotation    (Blank rows = not tested)   LOWER EXTREMITY  MMT:    MMT Right eval Left eval  Hip flexion 4/5 4+/5  Hip extension    Hip abduction WFL tested in sitting WFL tested in sitting  Hip adduction    Hip internal rotation    Hip external rotation    Knee flexion 5/5 tested in sitting 5/5 tested in sitting  Knee extension 5/5 5/5  Ankle dorsiflexion 5/5  5/5  Ankle plantarflexion WFL tested in sitting WFL tested in sitting  Ankle inversion    Ankle eversion     (Blank rows = not tested)  LUMBAR SPECIAL TESTS:  Supine SLR test: positive bilat  FUNCTIONAL TESTS:  5x STS:  16 sec without UE's.   GAIT: Distance walked: approx 120 ft Assistive device utilized: Single point cane Level of assistance: Complete Independence Comments: Pt ambulates with increased Wb'ing thru cane and has forward flexed posture the more she walks.  Pt had antalgic limp, increased fwd flexion, and lean to the L when ambulating without cane.     TODAY'S TREATMENT  Pt seen for aquatic therapy today.  Treatment took place in water 3.25-4.8 ft in depth at the [facility] pool. Temp of water was 91.  Pt entered/exited the pool via stairs (step through pattern) independently with bilat rail.  Intro to setting  Walking forward. Back and side stepping in all depths Vertical suspension yellow noodle under arms posteriorly: cycling; scissoring and skiiing (with some difficulty) Lumbar flex stretch using kick board/hip hinge positioning Lumbar rotation stretch Noodle pull down . Cues for neutral spine, technique and pace Holding to wall 3 ft: df; pf; high knee marching, add/abd.  -Attempted with ue support of 2 foam hand buoys without success. Walking forward and back 1 foam buoys submerged x 2 widths ea Kick board push pull 2x12  Pt requires buoyancy for support and to offload joints with strengthening exercises. Viscosity of the water is needed for resistance of strengthening; water current perturbations provides challenge to standing balance unsupported, requiring increased core activation.    PATIENT EDUCATION:  Education details: Educated pt in objective findings, dx, prognosis, rationale of exercises, and POC.  Educated pt in aquatic process and the benefits and properties of aquatic therapy.   Person educated: Patient Education method:  Explanation Education comprehension: verbalized understanding   HOME EXERCISE PROGRAM: Not given today.  Pt has HS stretches from prior PT eval.   ASSESSMENT:  CLINICAL IMPRESSION: Pt indep and safe in environment. Pt edu on properties of water and benefits of aquatic therapy.  Instructed on neutral spine with activity; abdominal bracing with exercises for core engagement. Focus today on core stretching and gentle strengthening. Required ue support of wall with standing LE exercises due to weakness in core and inability to maintain upright balanced position supported only by 2 foam hand buoys. She tolerates session well with reduction in LBP by 1-2 points and enjoyed session.  Pt a good candidate fro aquatic skilled PT to facilitate and hasten progression towards goals.   Patient is an 81 y.o. female with a dx of LBP presenting to the clinic with c/o's of constant LBP, muscle weakness, and difficulty in walking.  Pt uses an AD for ambulation unless in home.  Pt is limited with ambulation due to pain and weakness and states she has difficulty standing up straight.  Pt demonstrates a forward flexed posture which worsens the more she walks and if she ambulates without an AD.  Pt able to work in her yard and garden though has  pain with bending and standing straight.  Pt reports having deficits with balance.  Pt should benefit from skilled PT with focus on aquatic therapy to address impairments and to improve overall function.    OBJECTIVE IMPAIRMENTS Abnormal gait, decreased activity tolerance, decreased balance, decreased endurance, decreased mobility, difficulty walking, decreased ROM, decreased strength, impaired flexibility, postural dysfunction, and pain.   ACTIVITY LIMITATIONS bending, standing, and locomotion level  PARTICIPATION LIMITATIONS: community activity and yard work/gardening  PERSONAL FACTORS Time since onset of injury/illness/exacerbation and 3+ comorbidities: Chronic fatigue  syndrome, thoracic degenerative changes, bilat TKA, and multiple lumbar surgeries  are also affecting patient's functional outcome.   REHAB POTENTIAL: Fair due to chronic pain and multiple lumbar surgeries  CLINICAL DECISION MAKING: Evolving/moderate complexity  EVALUATION COMPLEXITY: Moderate   GOALS:  SHORT TERM GOALS:   Pt will tolerate aquatic therapy without adverse effects for improved pain, function, strength, and tolerance to activity.  Baseline: Goal status: INITIAL Target date:  05/21/2022  2.  Pt will report at least a 25% improvement in her posture with ambulation. Baseline:  Goal status: INITIAL Target Goal:  06/04/2022  3.  Pt will be independent with land based HEP for improved core strength, pain, and function.  Baseline:  Goal status: INITIAL Target Goal:  06/04/2022  4.  Pt will progress with aquatic exercises for improved core strength in order for improved posture with ambulation and standing activities.  Baseline:  Goal status: INITIAL Target goal:  06/04/2022   LONG TERM GOALS: Target date: 06/29/2022  Pt will report at least a 60% improvement in standing straighter with her daily activities, mobility, and ambulation.  Baseline:  Goal status: INITIAL  2.  Pt will report she is able to ambulate her normal community distance without significant pain.  Baseline:  Goal status: INITIAL  3.  Pt will report improved stability with her daily mobility and ambulation.  Baseline:  Goal status: INITIAL  4.  Pt will report at least a 50% improvement in pain with gardening and her normal yard work.  Baseline:  Goal status: INITIAL     PLAN: PT FREQUENCY: 2x/week  PT DURATION: 6 weeks  PLANNED INTERVENTIONS: Therapeutic exercises, Therapeutic activity, Neuromuscular re-education, Balance training, Gait training, Patient/Family education, Stair training, Aquatic Therapy, Dry Needling, Electrical stimulation, Cryotherapy, Moist heat, Taping, Manual therapy,  and Re-evaluation.  PLAN FOR NEXT SESSION: 3-4 visits of aquatic therapy and then return on land for 1-2 visits to work on HEP.    [name] [name]) [name] [name] MPT 05/18/22 4:14 PM

## 2022-05-20 ENCOUNTER — Ambulatory Visit (HOSPITAL_BASED_OUTPATIENT_CLINIC_OR_DEPARTMENT_OTHER): Payer: Medicare Other | Admitting: Physical Therapy

## 2022-05-20 ENCOUNTER — Encounter (HOSPITAL_BASED_OUTPATIENT_CLINIC_OR_DEPARTMENT_OTHER): Payer: Self-pay | Admitting: Physical Therapy

## 2022-05-20 DIAGNOSIS — M6281 Muscle weakness (generalized): Secondary | ICD-10-CM

## 2022-05-20 DIAGNOSIS — M5459 Other low back pain: Secondary | ICD-10-CM | POA: Diagnosis not present

## 2022-05-20 DIAGNOSIS — R262 Difficulty in walking, not elsewhere classified: Secondary | ICD-10-CM | POA: Diagnosis not present

## 2022-05-20 DIAGNOSIS — M5442 Lumbago with sciatica, left side: Secondary | ICD-10-CM | POA: Diagnosis not present

## 2022-05-20 DIAGNOSIS — G8929 Other chronic pain: Secondary | ICD-10-CM | POA: Diagnosis not present

## 2022-05-20 DIAGNOSIS — M5441 Lumbago with sciatica, right side: Secondary | ICD-10-CM | POA: Diagnosis not present

## 2022-05-20 NOTE — Therapy (Signed)
OUTPATIENT PHYSICAL THERAPY THORACOLUMBAR    Patient Name: LIDIE GLADE MRN: [id number] DOB:1941-10-18, 81 y.o., female Today's Date: 05/20/2022   PT End of Session - 05/20/22 1538     Visit Number 3    Number of Visits 12    Date for PT Re-Evaluation 06/18/22    Authorization Type Medicare A and B    PT Start Time 1532    PT Stop Time 1615    PT Time Calculation (min) 43 min    Activity Tolerance Patient tolerated treatment well    Behavior During Therapy WFL for tasks assessed/performed             Past Medical History:  Diagnosis Date   Acquired left flat foot    Acquired leg length discrepancy    Allergic urticaria    Allergy    Anemia    took iron for a while   Arthritis    lower back, knees   Blood transfusion without reported diagnosis    Bronchitis    history only   Cataract    Chronic fatigue syndrome    Chronic pain    lower back   Contusion of left hand    Female bladder prolapse    Fracture of metatarsal bone    w nonunion    Gout    Hallux rigidus    History of measles    History of mumps    History of rubella    Hyperlipidemia    Hypertension    Hypothyroidism    Imbalance    Macular degeneration    wet- right eye  dry- left eye   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[place])    Metatarsalgia of left foot    Missed ab    x 6 - 2 surgery, 4 resolved on it's own   Neuromuscular disorder ([place])    , left foot has some numbeness- uses cane   Numbness    outer edge of heel left foot    Osteopenia    Pneumonia    PONV (postoperative nausea and vomiting)    states she had scop patch in past,states she sets off alarms when she is waking up, she "forgets to breathe"    Seasonal allergies    Shingles    Tinnitus    Trigger middle finger of left hand    Ulcer    hx   Urinary incontinence    Past Surgical History:  Procedure Laterality Date   ABDOMINAL HYSTERECTOMY Bilateral 10/07/2014   Procedure: TOTAL ABDOMINAL HYSTERECTOMY WITH  BILATERAL SALPINGO OOPHORECTOMY AND HALBANS CULDOELASTY ;  Surgeon: Lyman Speller, MD;  Location: [facility];  Service: Gynecology;  Laterality: Bilateral;  Miller primary/Silva assist  4 hours total   ANTERIOR AND POSTERIOR REPAIR N/A 10/07/2014   Procedure: ANTERIOR (CYSTOCELE) AND POSTERIOR REPAIR (RECTOCELE);  Surgeon: Lyman Speller, MD;  Location: [facility];  Service: Gynecology;  Laterality: N/A;   APPENDECTOMY     BACK SURGERY  09,00,10,99, 6/14,9/14   x6 surgeries on back -Spinal Fusin L4-L5, Lumbar Fusion L1-S1   BLADDER SUSPENSION N/A 10/07/2014   Procedure: TRANSVAGINAL TAPE (TVT) EXACT PROCEDURE;  Surgeon: Lyman Speller, MD;  Location: [facility];  Service: Gynecology;  Laterality: N/A;   BREAST EXCISIONAL BIOPSY Left 1998   benign   BREAST SURGERY     left cyst- benign   BUNIONECTOMY  03/02/11 and 11/04/11   left foot x2   CARPAL TUNNEL RELEASE     both  CATARACT EXTRACTION Left 08/03/2019   CATARACT EXTRACTION Right 08/03/2019   CYSTOSCOPY N/A 10/07/2014   Procedure: CYSTOSCOPY;  Surgeon: Lyman Speller, MD;  Location: [facility];  Service: Gynecology;  Laterality: N/A;   DILATION AND CURETTAGE OF UTERUS     x2   Eye injections Left    HAMMER TOE SURGERY  4/12   lt foot-2-3   I&D and spina abscess L4-5     JOINT REPLACEMENT     R- TKA- 2013   KNEE ARTHROSCOPY  2007   both   LUMBAR LAMINECTOMY  05/1999   L4-L5   METATARSAL OSTEOTOMY  3/12   lt   ORIF TOE FRACTURE  11/04/2011   Procedure: OPEN REDUCTION INTERNAL FIXATION (ORIF) METATARSAL (TOE) FRACTURE;  Surgeon: Wylene Simmer, MD;  Location: [facility];  Service: Orthopedics;  Laterality: Left;  left revision orif 1st metatarsal with autograft from left calcaneous and left 2nd-3rd metatarsal weil osteotomy   osteoma  12   forehead x2   OTHER SURGICAL HISTORY  05/08/13   hardware removed from left foot   removal of two osteomas     on forehead-2008   STERIOD INJECTION Left 11/04/2015   Procedure:  STEROID INJECTION;  Surgeon: Gaynelle Arabian, MD;  Location: [facility];  Service: Orthopedics;  Laterality: Left;   THUMB FUSION  2008   rt   TOTAL KNEE ARTHROPLASTY  08/29/2012   Procedure: TOTAL KNEE ARTHROPLASTY;  Surgeon: Hessie Dibble, MD;  Location: [facility];  Service: Orthopedics;  Laterality: Right;   TOTAL KNEE ARTHROPLASTY Left 05/17/2016   Procedure: LEFT TOTAL KNEE ARTHROPLASTY;  Surgeon: Gaynelle Arabian, MD;  Location: [facility];  Service: Orthopedics;  Laterality: Left;   TOTAL KNEE ARTHROPLASTY Right    TOTAL KNEE REVISION Right 11/04/2015   Procedure: TOTAL KNEE ARTHROPLASTY REVISION;  Surgeon: Gaynelle Arabian, MD;  Location: [facility];  Service: Orthopedics;  Laterality: Right;   TOTAL SHOULDER ARTHROPLASTY Right 05/09/2014   Procedure: TOTAL SHOULDER ARTHROPLASTY;  Surgeon: Nita Sells, MD;  Location: [facility];  Service: Orthopedics;  Laterality: Right;  Right shoulder arthroplasty   TUBAL LIGATION     Patient Active Problem List   Diagnosis Date Noted   Chronic low back pain 03/04/2022   Chronic kidney disease, stage 3b ([place]) 02/03/2022   History of total knee replacement, right 03/05/2018   Hypokalemia 05/19/2016   Sinus tachycardia 05/19/2016   OA (osteoarthritis) of knee 05/17/2016   Failed total right knee replacement ([place]) 11/04/2015   Failed total knee, right ([name]) 11/04/2015   S/P TAH (total abdominal hysterectomy) 10/08/2014   Pelvic prolapse 10/07/2014   Cystocele 08/16/2014   Glenohumeral arthritis 05/09/2014   Right knee DJD 08/29/2012    Class: Chronic    PCP: Tonna Boehringer, MD  REFERRING PROVIDER: Lyndal Pulley, DO   REFERRING DIAG: M54.42,M54.41,G89.29 (ICD-10-CM) - Chronic bilateral low back pain with bilateral sciatica   Rationale for Evaluation and Treatment Rehabilitation  THERAPY DIAG:  Other low back pain  Muscle weakness (generalized)  Difficulty in walking, not elsewhere classified  ONSET DATE: Chronic pain ; MD visit/order  05/04/2022   SUBJECTIVE:        "This is great will help a lot"  SUBJECTIVE STATEMENT: Pt reports having back pain for > 20 years and a hx of 6 lumbar surgeries including laminectomy and fusions.  Her last surgery was a L1-S1 fusion in 2014.  Pt states she probably needs thoracic surgery but her and and her MD in agreement to not have surgery.  Pt saw MD who ordered PT for aquatic therapy.  Pt was evaluated by PT on 03/04/2022 and received HS stretching for HEP.  Pt states she had tried to call clinic and schedule twice though never received a call back.  Pt was trying to schedule PT when she was in town with her husband.  Her husband travels out of town for work.   Pt states she needs strength and balance.  Pt has difficulty standing up straight and bends fwd as she walks.  Pt uses her 3 wheeled walker when walking for distance.  She ambulates with a SPC otherwise unless in home.  Pt is limited with ambulation due to pain and weakness.  Pt able to work in her yard and garden though has pain with bending and standing straight.  Pt does have some knee pain with performing transfers.   PERTINENT HISTORY:  -Chronic back pain with x ray findings of significant DDD at T12-L1 and mild degenerative changes at T10-T11 and T11-T12 -Chronic fatigue syndrome, osteopenia, Cervical spondylosis, and Macular degeneratio -PSHx:  6 lumbar surgeries with L1-S1 fusion in 2014, bilat TKA, R TSA     PAIN:  Are you having pain? Yes NPRS:  Current: 5/10, Worst:  8/10, Best: 3.5-4/10 Location:  central lower lumbar ; Pt states she can have pain shooting down legs L > R occasionally Type: constant; aching; can be sharp at times    PRECAUTIONS: Other: L1-S1 fusion in 2014, degenerative thoracic changes, osteopenia, bilat TKA, R TSA  WEIGHT  BEARING RESTRICTIONS No  FALLS:  Has patient fallen in last 6 months? Yes. Number of falls 1; tripped over a large rock in her yard  LIVING ENVIRONMENT: Lives with: lives with their spouse Lives in: 1 level home Stairs: 4 steps to enter home with 1 rail Has following equipment at home: Single point cane and 3 wheeled walker  OCCUPATION: Pt is retired  PLOF: Independent; Pt has a Hx of lumbar pain. She has been using cane for 5-6 years.  PATIENT GOALS improve strength and balance.  Wants to be more steady on feet. To be able to stand straighter with ambulation   OBJECTIVE:   DIAGNOSTIC FINDINGS:  Xrays  IMPRESSION: 1. Postsurgical changes of prior PLIF with interbody grafts at each level and an interbody cage at L4-L5. No evidence of hardware complication. 2. Interval development of significant adjacent level degenerative disc disease at T12-L1. 3. More mild adjacent level degenerative changes also visualized at T10-T11 and T11-T12.  IMPRESSION: Exaggerated thoracic kyphosis with adjacent level degenerative disc disease at T12-T11.  Changes of degenerative disc disease noted throughout the majority of the thoracic spine. No evidence of acute fracture.  PATIENT SURVEYS:  Modified Oswestry 18%     COGNITION:  Overall cognitive status: Within functional limits for tasks assessed       MUSCLE LENGTH: Hamstrings: tigtness in bilat HS worse on L    LUMBAR ROM:   Active  AROM  eval  Flexion WFL  Extension   Right lateral flexion 75% with pain  Left lateral flexion WFL  Right rotation   Left rotation    (Blank rows = not tested)   LOWER EXTREMITY  MMT:    MMT Right eval Left eval  Hip flexion 4/5 4+/5  Hip extension    Hip abduction WFL tested in sitting WFL tested in sitting  Hip adduction    Hip internal rotation    Hip external rotation    Knee flexion 5/5 tested in sitting 5/5 tested in sitting  Knee extension 5/5 5/5  Ankle dorsiflexion 5/5  5/5  Ankle plantarflexion WFL tested in sitting WFL tested in sitting  Ankle inversion    Ankle eversion     (Blank rows = not tested)  LUMBAR SPECIAL TESTS:  Supine SLR test: positive bilat  FUNCTIONAL TESTS:  5x STS:  16 sec without UE's.   GAIT: Distance walked: approx 120 ft Assistive device utilized: Single point cane Level of assistance: Complete Independence Comments: Pt ambulates with increased Wb'ing thru cane and has forward flexed posture the more she walks.  Pt had antalgic limp, increased fwd flexion, and lean to the L when ambulating without cane.     TODAY'S TREATMENT  Pt seen for aquatic therapy today.  Treatment took place in water 3.25-4.8 ft in depth at the [facility] pool. Temp of water was 91.  Pt entered/exited the pool via stairs (step through pattern) independently with bilat rail.    Walking forward. Back and side stepping  Lumbar flex stretch using kick board/hip hinge positioning 2x3 25 s hold Lumbar rotation stretch x10. Cues to pause end range for stretch Seated 4th step: flutter (SLR); add/abd 3x20 STS 3rd step x 3; 4th step x 5.  Cues for weight shift/execution, immediate standing balance Bottom step minisquats x10  Vertical suspension yellow noodle under arms posteriorly: cycling; scissoring and skiiing (completed with more ease today/coordination)  Noodle blue pull down 2x10. Cues for neutral spine, technique and pace  Walking forward and back 2 foam buoys submerged x 2 widths ea. Side lunge with 2 foam hand buoy shoulder add/abd x 4 widths. VCues and demonstration for proper execution   Pt requires buoyancy for support and to offload joints with strengthening exercises. Viscosity of the water is needed for resistance of strengthening; water current perturbations provides challenge to standing balance unsupported, requiring increased core activation.    PATIENT EDUCATION:  Education details: Educated pt in objective findings, dx,  prognosis, rationale of exercises, and POC.  Educated pt in aquatic process and the benefits and properties of aquatic therapy.   Person educated: Patient Education method: Explanation Education comprehension: verbalized understanding   HOME EXERCISE PROGRAM: Not given today.  Pt has HS stretches from prior PT eval.   ASSESSMENT:  CLINICAL IMPRESSION: Slight decrease in pain after last session. Returns with pain "as usual". Pt with some discomfort in LB with LE exercises. Best to alternate focus of muscle work from LE to back allow for rest completed today with success. Pt with good motivation throughout session.  Occasionally needs cue to decrease intensity to allow for rest.  Goals ongoing.   Pt indep and safe in environment. Pt edu on properties of water and benefits of aquatic therapy.  Instructed on neutral spine with activity; abdominal bracing with exercises for core engagement. Focus today on core stretching and gentle strengthening. Required ue support of wall with standing LE exercises due to weakness in core and inability to maintain upright balanced position supported only by 2 foam hand buoys. She tolerates session well with reduction in LBP by 1-2 points and enjoyed session.  Pt a good candidate fro aquatic skilled PT to facilitate  and hasten progression towards goals.   Patient is an 81 y.o. female with a dx of LBP presenting to the clinic with c/o's of constant LBP, muscle weakness, and difficulty in walking.  Pt uses an AD for ambulation unless in home.  Pt is limited with ambulation due to pain and weakness and states she has difficulty standing up straight.  Pt demonstrates a forward flexed posture which worsens the more she walks and if she ambulates without an AD.  Pt able to work in her yard and garden though has pain with bending and standing straight.  Pt reports having deficits with balance.  Pt should benefit from skilled PT with focus on aquatic therapy to address  impairments and to improve overall function.    OBJECTIVE IMPAIRMENTS Abnormal gait, decreased activity tolerance, decreased balance, decreased endurance, decreased mobility, difficulty walking, decreased ROM, decreased strength, impaired flexibility, postural dysfunction, and pain.   ACTIVITY LIMITATIONS bending, standing, and locomotion level  PARTICIPATION LIMITATIONS: community activity and yard work/gardening  PERSONAL FACTORS Time since onset of injury/illness/exacerbation and 3+ comorbidities: Chronic fatigue syndrome, thoracic degenerative changes, bilat TKA, and multiple lumbar surgeries  are also affecting patient's functional outcome.   REHAB POTENTIAL: Fair due to chronic pain and multiple lumbar surgeries  CLINICAL DECISION MAKING: Evolving/moderate complexity  EVALUATION COMPLEXITY: Moderate   GOALS:  SHORT TERM GOALS:   Pt will tolerate aquatic therapy without adverse effects for improved pain, function, strength, and tolerance to activity.  Baseline: Goal status: INITIAL Target date:  05/21/2022  2.  Pt will report at least a 25% improvement in her posture with ambulation. Baseline:  Goal status: INITIAL Target Goal:  06/04/2022  3.  Pt will be independent with land based HEP for improved core strength, pain, and function.  Baseline:  Goal status: INITIAL Target Goal:  06/04/2022  4.  Pt will progress with aquatic exercises for improved core strength in order for improved posture with ambulation and standing activities.  Baseline:  Goal status: INITIAL Target goal:  06/04/2022   LONG TERM GOALS: Target date: 07/01/2022  Pt will report at least a 60% improvement in standing straighter with her daily activities, mobility, and ambulation.  Baseline:  Goal status: INITIAL  2.  Pt will report she is able to ambulate her normal community distance without significant pain.  Baseline:  Goal status: INITIAL  3.  Pt will report improved stability with her daily  mobility and ambulation.  Baseline:  Goal status: INITIAL  4.  Pt will report at least a 50% improvement in pain with gardening and her normal yard work.  Baseline:  Goal status: INITIAL     PLAN: PT FREQUENCY: 2x/week  PT DURATION: 6 weeks  PLANNED INTERVENTIONS: Therapeutic exercises, Therapeutic activity, Neuromuscular re-education, Balance training, Gait training, Patient/Family education, Stair training, Aquatic Therapy, Dry Needling, Electrical stimulation, Cryotherapy, Moist heat, Taping, Manual therapy, and Re-evaluation.  PLAN FOR NEXT SESSION: 3-4 visits of aquatic therapy and then return on land for 1-2 visits to work on HEP.    Stanton Kidney Tharon Aquas) Malcome Ambrocio MPT 05/20/22 3:40 PM

## 2022-05-24 DIAGNOSIS — G894 Chronic pain syndrome: Secondary | ICD-10-CM | POA: Diagnosis not present

## 2022-05-24 DIAGNOSIS — M961 Postlaminectomy syndrome, not elsewhere classified: Secondary | ICD-10-CM | POA: Diagnosis not present

## 2022-05-24 DIAGNOSIS — M47818 Spondylosis without myelopathy or radiculopathy, sacral and sacrococcygeal region: Secondary | ICD-10-CM | POA: Diagnosis not present

## 2022-05-24 DIAGNOSIS — Z79891 Long term (current) use of opiate analgesic: Secondary | ICD-10-CM | POA: Diagnosis not present

## 2022-05-26 ENCOUNTER — Ambulatory Visit (HOSPITAL_BASED_OUTPATIENT_CLINIC_OR_DEPARTMENT_OTHER): Payer: Medicare Other | Admitting: Physical Therapy

## 2022-05-26 ENCOUNTER — Encounter (HOSPITAL_BASED_OUTPATIENT_CLINIC_OR_DEPARTMENT_OTHER): Payer: Self-pay | Admitting: Physical Therapy

## 2022-05-26 DIAGNOSIS — R262 Difficulty in walking, not elsewhere classified: Secondary | ICD-10-CM | POA: Diagnosis not present

## 2022-05-26 DIAGNOSIS — G8929 Other chronic pain: Secondary | ICD-10-CM | POA: Diagnosis not present

## 2022-05-26 DIAGNOSIS — M6281 Muscle weakness (generalized): Secondary | ICD-10-CM

## 2022-05-26 DIAGNOSIS — M5442 Lumbago with sciatica, left side: Secondary | ICD-10-CM | POA: Diagnosis not present

## 2022-05-26 DIAGNOSIS — M5459 Other low back pain: Secondary | ICD-10-CM

## 2022-05-26 DIAGNOSIS — M5441 Lumbago with sciatica, right side: Secondary | ICD-10-CM | POA: Diagnosis not present

## 2022-05-26 NOTE — Therapy (Signed)
OUTPATIENT PHYSICAL THERAPY THORACOLUMBAR    Patient Name: [name] [name] MRN: [id number] DOB:08-17-1941, 81 y.o., female Today's Date: 05/27/2022   PT End of Session - 05/26/22 1653     Visit Number 4    Number of Visits 12    Date for PT Re-Evaluation 06/18/22    Authorization Type Medicare A and B    PT Start Time 1608    PT Stop Time 1649    PT Time Calculation (min) 41 min    Activity Tolerance Patient tolerated treatment well    Behavior During Therapy WFL for tasks assessed/performed              Past Medical History:  Diagnosis Date   Acquired left flat foot    Acquired leg length discrepancy    Allergic urticaria    Allergy    Anemia    took iron for a while   Arthritis    lower back, knees   Blood transfusion without reported diagnosis    Bronchitis    history only   Cataract    Chronic fatigue syndrome    Chronic pain    lower back   Contusion of left hand    Female bladder prolapse    Fracture of metatarsal bone    w nonunion    Gout    Hallux rigidus    History of measles    History of mumps    History of rubella    Hyperlipidemia    Hypertension    Hypothyroidism    Imbalance    Macular degeneration    wet- right eye  dry- left eye   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[place])    Metatarsalgia of left foot    Missed ab    x 6 - 2 surgery, 4 resolved on it's own   Neuromuscular disorder ([place])    , left foot has some numbeness- uses cane   Numbness    outer edge of heel left foot    Osteopenia    Pneumonia    PONV (postoperative nausea and vomiting)    states she had scop patch in past,states she sets off alarms when she is waking up, she "forgets to breathe"    Seasonal allergies    Shingles    Tinnitus    Trigger middle finger of left hand    Ulcer    hx   Urinary incontinence    Past Surgical History:  Procedure Laterality Date   ABDOMINAL HYSTERECTOMY Bilateral 10/07/2014   Procedure: TOTAL ABDOMINAL HYSTERECTOMY WITH  BILATERAL SALPINGO OOPHORECTOMY AND HALBANS CULDOELASTY ;  Surgeon: Lyman Speller, MD;  Location: [facility];  Service: Gynecology;  Laterality: Bilateral;  Miller primary/Silva assist  4 hours total   ANTERIOR AND POSTERIOR REPAIR N/A 10/07/2014   Procedure: ANTERIOR (CYSTOCELE) AND POSTERIOR REPAIR (RECTOCELE);  Surgeon: Lyman Speller, MD;  Location: [facility];  Service: Gynecology;  Laterality: N/A;   APPENDECTOMY     BACK SURGERY  09,00,10,99, 6/14,9/14   x6 surgeries on back -Spinal Fusin L4-L5, Lumbar Fusion L1-S1   BLADDER SUSPENSION N/A 10/07/2014   Procedure: TRANSVAGINAL TAPE (TVT) EXACT PROCEDURE;  Surgeon: Lyman Speller, MD;  Location: [facility];  Service: Gynecology;  Laterality: N/A;   BREAST EXCISIONAL BIOPSY Left 1998   benign   BREAST SURGERY     left cyst- benign   BUNIONECTOMY  03/02/11 and 11/04/11   left foot x2   CARPAL TUNNEL RELEASE  both   CATARACT EXTRACTION Left 08/03/2019   CATARACT EXTRACTION Right 08/03/2019   CYSTOSCOPY N/A 10/07/2014   Procedure: CYSTOSCOPY;  Surgeon: Lyman Speller, MD;  Location: [facility];  Service: Gynecology;  Laterality: N/A;   DILATION AND CURETTAGE OF UTERUS     x2   Eye injections Left    HAMMER TOE SURGERY  4/12   lt foot-2-3   I&D and spina abscess L4-5     JOINT REPLACEMENT     R- TKA- 2013   KNEE ARTHROSCOPY  2007   both   LUMBAR LAMINECTOMY  05/1999   L4-L5   METATARSAL OSTEOTOMY  3/12   lt   ORIF TOE FRACTURE  11/04/2011   Procedure: OPEN REDUCTION INTERNAL FIXATION (ORIF) METATARSAL (TOE) FRACTURE;  Surgeon: Wylene Simmer, MD;  Location: [facility];  Service: Orthopedics;  Laterality: Left;  left revision orif 1st metatarsal with autograft from left calcaneous and left 2nd-3rd metatarsal weil osteotomy   osteoma  12   forehead x2   OTHER SURGICAL HISTORY  05/08/13   hardware removed from left foot   removal of two osteomas     on forehead-2008   STERIOD INJECTION Left 11/04/2015   Procedure:  STEROID INJECTION;  Surgeon: Gaynelle Arabian, MD;  Location: [facility];  Service: Orthopedics;  Laterality: Left;   THUMB FUSION  2008   rt   TOTAL KNEE ARTHROPLASTY  08/29/2012   Procedure: TOTAL KNEE ARTHROPLASTY;  Surgeon: Hessie Dibble, MD;  Location: [facility];  Service: Orthopedics;  Laterality: Right;   TOTAL KNEE ARTHROPLASTY Left 05/17/2016   Procedure: LEFT TOTAL KNEE ARTHROPLASTY;  Surgeon: Gaynelle Arabian, MD;  Location: [facility];  Service: Orthopedics;  Laterality: Left;   TOTAL KNEE ARTHROPLASTY Right    TOTAL KNEE REVISION Right 11/04/2015   Procedure: TOTAL KNEE ARTHROPLASTY REVISION;  Surgeon: Gaynelle Arabian, MD;  Location: [facility];  Service: Orthopedics;  Laterality: Right;   TOTAL SHOULDER ARTHROPLASTY Right 05/09/2014   Procedure: TOTAL SHOULDER ARTHROPLASTY;  Surgeon: Nita Sells, MD;  Location: [facility];  Service: Orthopedics;  Laterality: Right;  Right shoulder arthroplasty   TUBAL LIGATION     Patient Active Problem List   Diagnosis Date Noted   Chronic low back pain 03/04/2022   Chronic kidney disease, stage 3b ([place]) 02/03/2022   History of total knee replacement, right 03/05/2018   Hypokalemia 05/19/2016   Sinus tachycardia 05/19/2016   OA (osteoarthritis) of knee 05/17/2016   Failed total right knee replacement ([place]) 11/04/2015   Failed total knee, right ([place]) 11/04/2015   S/P TAH (total abdominal hysterectomy) 10/08/2014   Pelvic prolapse 10/07/2014   Cystocele 08/16/2014   Glenohumeral arthritis 05/09/2014   Right knee DJD 08/29/2012    Class: Chronic    PCP: Tonna Boehringer, MD  REFERRING PROVIDER: Lyndal Pulley, DO   REFERRING DIAG: M54.42,M54.41,G89.29 (ICD-10-CM) - Chronic bilateral low back pain with bilateral sciatica   Rationale for Evaluation and Treatment Rehabilitation  THERAPY DIAG:  Other low back pain  Muscle weakness (generalized)  Difficulty in walking, not elsewhere classified  ONSET DATE: Chronic pain ; MD visit/order  05/04/2022   SUBJECTIVE:  SUBJECTIVE STATEMENT: Pt reports having back pain for > 20 years and a hx of 6 lumbar surgeries including laminectomy and fusions.  Her last surgery was a L1-S1 fusion in 2014.  Pt states she probably needs thoracic surgery but her and and her MD in agreement to not have surgery.  Pt saw MD who ordered PT for aquatic therapy.   Pt has difficulty standing up straight and bends fwd as she walks.  Pt uses her 3 wheeled walker when walking for distance.  She ambulates with a SPC otherwise unless in home.  Pt is limited with ambulation due to pain and weakness.  Pt able to work in her yard and garden though has pain with bending and standing straight.  Pt does have some knee pain with performing transfers.   Pt denies any adverse effects after prior Rx.  Pt reports she had some pain in R knee after Rx though states her "R knee can be sore on a good day".    PERTINENT HISTORY:  -Chronic back pain with x ray findings of significant DDD at T12-L1 and mild degenerative changes at T10-T11 and T11-T12 -Chronic fatigue syndrome, osteopenia, Cervical spondylosis, and Macular degeneratio -PSHx:  6 lumbar surgeries with L1-S1 fusion in 2014, bilat TKA, R TSA     PAIN:  Are you having pain? Yes NPRS:  Current: 4/10, Worst:  8/10, Best: 3.5-4/10 Location:  central lower lumbar ; Pt states she can have pain shooting down legs L > R occasionally Type: constant; aching; can be sharp at times    PRECAUTIONS: Other: L1-S1 fusion in 2014, degenerative thoracic changes, osteopenia, bilat TKA, R TSA  WEIGHT BEARING RESTRICTIONS No  FALLS:  Has patient fallen in last 6 months? Yes. Number of falls 1; tripped over a large rock in her yard  LIVING ENVIRONMENT: Lives with: lives with their spouse Lives in: 1 level home Stairs: 4 steps to  enter home with 1 rail Has following equipment at home: Single point cane and 3 wheeled walker  OCCUPATION: Pt is retired  PLOF: Independent; Pt has a Hx of lumbar pain. She has been using cane for 5-6 years.  PATIENT GOALS improve strength and balance.  Wants to be more steady on feet. To be able to stand straighter with ambulation   OBJECTIVE:   DIAGNOSTIC FINDINGS:  Xrays  IMPRESSION: 1. Postsurgical changes of prior PLIF with interbody grafts at each level and an interbody cage at L4-L5. No evidence of hardware complication. 2. Interval development of significant adjacent level degenerative disc disease at T12-L1. 3. More mild adjacent level degenerative changes also visualized at T10-T11 and T11-T12.  IMPRESSION: Exaggerated thoracic kyphosis with adjacent level degenerative disc disease at T12-T11.  Changes of degenerative disc disease noted throughout the majority of the thoracic spine. No evidence of acute fracture.    TODAY'S TREATMENT   Reviewed her current routine.  PT established HEP. Educated pt on TrA contraction and palpation.  Pt performed TrA contractions.  Pt performed: Scap retraction with 3-5 sec hold x 10 reps and x 5 reps Seated Rows with retraction with RTB approx 15 reps and with GTB x12 reps Supine clamshells with GTB x 12 reps and with BTB 2x10 reps.  Supine SLR 2x10 reps Seated HS stretch 2x 20 seconds bilat     PATIENT EDUCATION:  Education details: Pt received a HEP handout and was educated in correct form and appropriate frequency.  Educated pt in dx, exercise form, rationale of exercises, and POC.  Person educated: Patient Education method: Explanation Education comprehension: verbalized understanding   HOME EXERCISE PROGRAM: Access Code: [email or web address] URL: [email or web address] Date: 05/26/2022 Prepared by: Ronny Flurry  Exercises - Seated Scapular Retraction  - 1-2 x daily - 5-6 x weekly - 2 sets - 10 reps -  3-5 seconds hold - Seated Shoulder Row with Anchored Resistance  - 1 x daily - 3-4 x weekly - 2-3 sets - 10 reps - Hooklying Clamshell with Resistance  - 1 x daily - 4 x weekly - 2 sets - 10 reps - Supine Transversus Abdominis Bracing - Hands on Stomach  - 2 x daily - 7 x weekly - 2 sets - 10 reps - Seated Hamstring Stretch  - 2 x daily - 7 x weekly - 2 reps - 20 seconds hold  ASSESSMENT:  CLINICAL IMPRESSION: PT established HEP today and gave pt a HEP handout.  PT educated pt in correct form and appropriate frequency.  Pt has significant difficulty with performing TrA contraction.  Pt reports having increased pain from 4/10 to 6/10 with supine SLR.  Pt responded well to Rx reporting no increased pain after Rx.  She should benefit from continued skilled PT with focus on aquatic therapy to address impairments and goals and to improve overall function.   OBJECTIVE IMPAIRMENTS Abnormal gait, decreased activity tolerance, decreased balance, decreased endurance, decreased mobility, difficulty walking, decreased ROM, decreased strength, impaired flexibility, postural dysfunction, and pain.   ACTIVITY LIMITATIONS bending, standing, and locomotion level  PARTICIPATION LIMITATIONS: community activity and yard work/gardening  PERSONAL FACTORS Time since onset of injury/illness/exacerbation and 3+ comorbidities: Chronic fatigue syndrome, thoracic degenerative changes, bilat TKA, and multiple lumbar surgeries  are also affecting patient's functional outcome.   REHAB POTENTIAL: Fair due to chronic pain and multiple lumbar surgeries  CLINICAL DECISION MAKING: Evolving/moderate complexity  EVALUATION COMPLEXITY: Moderate   GOALS:  SHORT TERM GOALS:   Pt will tolerate aquatic therapy without adverse effects for improved pain, function, strength, and tolerance to activity.  Baseline: Goal status: INITIAL Target date:  05/21/2022  2.  Pt will report at least a 25% improvement in her posture with  ambulation. Baseline:  Goal status: INITIAL Target Goal:  06/04/2022  3.  Pt will be independent with land based HEP for improved core strength, pain, and function.  Baseline:  Goal status: INITIAL Target Goal:  06/04/2022  4.  Pt will progress with aquatic exercises for improved core strength in order for improved posture with ambulation and standing activities.  Baseline:  Goal status: INITIAL Target goal:  06/04/2022   LONG TERM GOALS: Target date: 07/08/2022  Pt will report at least a 60% improvement in standing straighter with her daily activities, mobility, and ambulation.  Baseline:  Goal status: INITIAL  2.  Pt will report she is able to ambulate her normal community distance without significant pain.  Baseline:  Goal status: INITIAL  3.  Pt will report improved stability with her daily mobility and ambulation.  Baseline:  Goal status: INITIAL  4.  Pt will report at least a 50% improvement in pain with gardening and her normal yard work.  Baseline:  Goal status: INITIAL     PLAN: PT FREQUENCY: 2x/week  PT DURATION: 6 weeks  PLANNED INTERVENTIONS: Therapeutic exercises, Therapeutic activity, Neuromuscular re-education, Balance training, Gait training, Patient/Family education, Stair training, Aquatic Therapy, Dry Needling, Electrical stimulation, Cryotherapy, Moist heat, Taping, Manual therapy, and Re-evaluation.  PLAN FOR NEXT SESSION: Cont with aquatic therapy.    Selinda Michaels  III PT, DPT 05/27/22 10:59 PM

## 2022-05-31 ENCOUNTER — Encounter (HOSPITAL_BASED_OUTPATIENT_CLINIC_OR_DEPARTMENT_OTHER): Payer: Self-pay | Admitting: Physical Therapy

## 2022-05-31 ENCOUNTER — Ambulatory Visit (HOSPITAL_BASED_OUTPATIENT_CLINIC_OR_DEPARTMENT_OTHER): Payer: Medicare Other | Admitting: Physical Therapy

## 2022-05-31 DIAGNOSIS — M6281 Muscle weakness (generalized): Secondary | ICD-10-CM

## 2022-05-31 DIAGNOSIS — M5442 Lumbago with sciatica, left side: Secondary | ICD-10-CM | POA: Diagnosis not present

## 2022-05-31 DIAGNOSIS — R262 Difficulty in walking, not elsewhere classified: Secondary | ICD-10-CM | POA: Diagnosis not present

## 2022-05-31 DIAGNOSIS — M5459 Other low back pain: Secondary | ICD-10-CM

## 2022-05-31 DIAGNOSIS — M5441 Lumbago with sciatica, right side: Secondary | ICD-10-CM | POA: Diagnosis not present

## 2022-05-31 DIAGNOSIS — G8929 Other chronic pain: Secondary | ICD-10-CM | POA: Diagnosis not present

## 2022-05-31 NOTE — Therapy (Signed)
OUTPATIENT PHYSICAL THERAPY THORACOLUMBAR TREATMENT   Patient Name: [name] [name] MRN: [id number] DOB:Feb 01, 1941, 81 y.o., female Today's Date: 05/31/2022   PT End of Session - 05/31/22 1103     Visit Number 5    Number of Visits 12    Date for PT Re-Evaluation 06/18/22    Authorization Type Medicare A and B    PT Start Time 1103    PT Stop Time 1145    PT Time Calculation (min) 42 min    Activity Tolerance Patient tolerated treatment well    Behavior During Therapy WFL for tasks assessed/performed              Past Medical History:  Diagnosis Date   Acquired left flat foot    Acquired leg length discrepancy    Allergic urticaria    Allergy    Anemia    took iron for a while   Arthritis    lower back, knees   Blood transfusion without reported diagnosis    Bronchitis    history only   Cataract    Chronic fatigue syndrome    Chronic pain    lower back   Contusion of left hand    Female bladder prolapse    Fracture of metatarsal bone    w nonunion    Gout    Hallux rigidus    History of measles    History of mumps    History of rubella    Hyperlipidemia    Hypertension    Hypothyroidism    Imbalance    Macular degeneration    wet- right eye  dry- left eye   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HCC)    Metatarsalgia of left foot    Missed ab    x 6 - 2 surgery, 4 resolved on it's own   Neuromuscular disorder (HCC)    , left foot has some numbeness- uses cane   Numbness    outer edge of heel left foot    Osteopenia    Pneumonia    PONV (postoperative nausea and vomiting)    states she had scop patch in past,states she sets off alarms when she is waking up, she "forgets to breathe"    Seasonal allergies    Shingles    Tinnitus    Trigger middle finger of left hand    Ulcer    hx   Urinary incontinence    Past Surgical History:  Procedure Laterality Date   ABDOMINAL HYSTERECTOMY Bilateral 10/07/2014   Procedure: TOTAL ABDOMINAL HYSTERECTOMY  WITH BILATERAL SALPINGO OOPHORECTOMY AND HALBANS CULDOELASTY ;  Surgeon: Annamaria Boots, MD;  Location: [facility];  Service: Gynecology;  Laterality: Bilateral;  Miller primary/Silva assist  4 hours total   ANTERIOR AND POSTERIOR REPAIR N/A 10/07/2014   Procedure: ANTERIOR (CYSTOCELE) AND POSTERIOR REPAIR (RECTOCELE);  Surgeon: Annamaria Boots, MD;  Location: [facility];  Service: Gynecology;  Laterality: N/A;   APPENDECTOMY     BACK SURGERY  09,00,10,99, 6/14,9/14   x6 surgeries on back -Spinal Fusin L4-L5, Lumbar Fusion L1-S1   BLADDER SUSPENSION N/A 10/07/2014   Procedure: TRANSVAGINAL TAPE (TVT) EXACT PROCEDURE;  Surgeon: Annamaria Boots, MD;  Location: [facility];  Service: Gynecology;  Laterality: N/A;   BREAST EXCISIONAL BIOPSY Left 1998   benign   BREAST SURGERY     left cyst- benign   BUNIONECTOMY  03/02/11 and 11/04/11   left foot x2   CARPAL TUNNEL RELEASE  both   CATARACT EXTRACTION Left 08/03/2019   CATARACT EXTRACTION Right 08/03/2019   CYSTOSCOPY N/A 10/07/2014   Procedure: CYSTOSCOPY;  Surgeon: Annamaria Boots, MD;  Location: [facility];  Service: Gynecology;  Laterality: N/A;   DILATION AND CURETTAGE OF UTERUS     x2   Eye injections Left    HAMMER TOE SURGERY  4/12   lt foot-2-3   I&D and spina abscess L4-5     JOINT REPLACEMENT     R- TKA- 2013   KNEE ARTHROSCOPY  2007   both   LUMBAR LAMINECTOMY  05/1999   L4-L5   METATARSAL OSTEOTOMY  3/12   lt   ORIF TOE FRACTURE  11/04/2011   Procedure: OPEN REDUCTION INTERNAL FIXATION (ORIF) METATARSAL (TOE) FRACTURE;  Surgeon: Toni Arthurs, MD;  Location: [facility];  Service: Orthopedics;  Laterality: Left;  left revision orif 1st metatarsal with autograft from left calcaneous and left 2nd-3rd metatarsal weil osteotomy   osteoma  12   forehead x2   OTHER SURGICAL HISTORY  05/08/13   hardware removed from left foot   removal of two osteomas     on forehead-2008   STERIOD INJECTION Left 11/04/2015    Procedure: STEROID INJECTION;  Surgeon: Ollen Gross, MD;  Location: [facility];  Service: Orthopedics;  Laterality: Left;   THUMB FUSION  2008   rt   TOTAL KNEE ARTHROPLASTY  08/29/2012   Procedure: TOTAL KNEE ARTHROPLASTY;  Surgeon: Velna Ochs, MD;  Location: [facility];  Service: Orthopedics;  Laterality: Right;   TOTAL KNEE ARTHROPLASTY Left 05/17/2016   Procedure: LEFT TOTAL KNEE ARTHROPLASTY;  Surgeon: Ollen Gross, MD;  Location: [facility];  Service: Orthopedics;  Laterality: Left;   TOTAL KNEE ARTHROPLASTY Right    TOTAL KNEE REVISION Right 11/04/2015   Procedure: TOTAL KNEE ARTHROPLASTY REVISION;  Surgeon: Ollen Gross, MD;  Location: [facility];  Service: Orthopedics;  Laterality: Right;   TOTAL SHOULDER ARTHROPLASTY Right 05/09/2014   Procedure: TOTAL SHOULDER ARTHROPLASTY;  Surgeon: Mable Paris, MD;  Location: [facility];  Service: Orthopedics;  Laterality: Right;  Right shoulder arthroplasty   TUBAL LIGATION     Patient Active Problem List   Diagnosis Date Noted   Chronic low back pain 03/04/2022   Chronic kidney disease, stage 3b (HCC) 02/03/2022   History of total knee replacement, right 03/05/2018   Hypokalemia 05/19/2016   Sinus tachycardia 05/19/2016   OA (osteoarthritis) of knee 05/17/2016   Failed total right knee replacement (HCC) 11/04/2015   Failed total knee, right (HCC) 11/04/2015   S/P TAH (total abdominal hysterectomy) 10/08/2014   Pelvic prolapse 10/07/2014   Cystocele 08/16/2014   Glenohumeral arthritis 05/09/2014   Right knee DJD 08/29/2012    Class: Chronic    PCP: Chaney Born, MD  REFERRING PROVIDER: Judi Saa, DO   REFERRING DIAG: M54.42,M54.41,G89.29 (ICD-10-CM) - Chronic bilateral low back pain with bilateral sciatica   Rationale for Evaluation and Treatment Rehabilitation  THERAPY DIAG:  Other low back pain  Muscle weakness (generalized)  Difficulty in walking, not elsewhere classified  ONSET DATE: Chronic pain ; MD  visit/order 05/04/2022   SUBJECTIVE:  SUBJECTIVE STATEMENT: Pt reports she was a little sore in abs and Rt shoulder after last land session, but it resolved by the next day.   PAIN:  Are you having pain? Yes NPRS:  Current: 3.5-4/10 Location:  central lower lumbar ; (no radicular symptoms today) Type: constant; aching; can be sharp at times  PERTINENT HISTORY:  -Chronic back pain with x ray findings of significant DDD at T12-L1 and mild degenerative changes at T10-T11 and T11-T12 -Chronic fatigue syndrome, osteopenia, Cervical spondylosis, and Macular degeneratio -PSHx:  6 lumbar surgeries with L1-S1 fusion in 2014, bilat TKA, R TSA    PRECAUTIONS: Other: L1-S1 fusion in 2014, degenerative thoracic changes, osteopenia, bilat TKA, R TSA  WEIGHT BEARING RESTRICTIONS No  FALLS:  Has patient fallen in last 6 months? Yes. Number of falls 1; tripped over a large rock in her yard  LIVING ENVIRONMENT: Lives with: lives with their spouse Lives in: 1 level home Stairs: 4 steps to enter home with 1 rail Has following equipment at home: Single point cane and 3 wheeled walker  OCCUPATION: Pt is retired  PLOF: Independent; Pt has a Hx of lumbar pain. She has been using cane for 5-6 years.  PATIENT GOALS improve strength and balance.  Wants to be more steady on feet. To be able to stand straighter with ambulation   OBJECTIVE:   DIAGNOSTIC FINDINGS:  Xrays  IMPRESSION: 1. Postsurgical changes of prior PLIF with interbody grafts at each level and an interbody cage at L4-L5. No evidence of hardware complication. 2. Interval development of significant adjacent level degenerative disc disease at T12-L1. 3. More mild adjacent level degenerative changes also visualized at T10-T11 and T11-T12.  IMPRESSION: Exaggerated thoracic kyphosis with  adjacent level degenerative disc disease at T12-T11.  Changes of degenerative disc disease noted throughout the majority of the thoracic spine. No evidence of acute fracture.    TODAY'S TREATMENT   Pt seen for aquatic therapy today.  Treatment took place in water 3.25-4.8 ft in depth at the [facility] pool. Temp of water was 91.  Pt entered/exited the pool via stairs (step through pattern) independently with bilat rail.   Walking forward, Back, and side stepping - holding yellow noodle for improved posture  blue noodle pull down to thighs x10; repeated with sqoodle x 10.  Kickboard push/ pull at 10, 12, and 2 o'clock in staggered stance Holding wall: squats x 10, hip abdct x 10; hip flex/ ext x 10 each  Seated on 4th step- STS x 5 (use of UE to steady; difficulty initiating upward motion) Vertical suspension yellow noodle under arms posteriorly: cycling; scissoring and skiiing  SLS x 10sec each Tandem stance with horiz head turns Hamstring stretch with foot on 2nd step L stretch holding rails    Pt requires buoyancy for support and to offload joints with strengthening exercises. Viscosity of the water is needed for resistance of strengthening; water current perturbations provides challenge to standing balance unsupported, requiring increased core activationReviewed her current routine.   PATIENT EDUCATION:  Education details: Educated pt in exercise form, rationale of exercises.   Person educated: Patient Education method: Explanation Education comprehension: verbalized understanding   HOME EXERCISE PROGRAM: Access Code: [email or web address] URL: [email or web address] Date: 05/26/2022 Prepared by: Aaron Edelman  Exercises - Seated Scapular Retraction  - 1-2 x daily - 5-6 x weekly - 2 sets - 10 reps - 3-5 seconds hold - Seated Shoulder Row with Anchored Resistance  - 1 x daily - 3-4 x weekly -  2-3 sets - 10 reps - Hooklying Clamshell with Resistance  - 1 x daily -  4 x weekly - 2 sets - 10 reps - Supine Transversus Abdominis Bracing - Hands on Stomach  - 2 x daily - 7 x weekly - 2 sets - 10 reps - Seated Hamstring Stretch  - 2 x daily - 7 x weekly - 2 reps - 20 seconds hold  ASSESSMENT:  CLINICAL IMPRESSION: Pt reported no increase in LBP while exercising in pool. Minor cues for more upright posture; trialed use of yellow noodle for UE support in hopes of more lifted chest. STS on steps was more difficult without use of hands today; she did report increased pain in LB after last session completing this.  She demonstrated decreased balance with L SLS.  She reported "shaky" legs upon exiting pool but this resolved quickly while walking to locker room.  She should benefit from continued skilled PT with focus on aquatic therapy to address impairments and goals and to improve overall function.   OBJECTIVE IMPAIRMENTS Abnormal gait, decreased activity tolerance, decreased balance, decreased endurance, decreased mobility, difficulty walking, decreased ROM, decreased strength, impaired flexibility, postural dysfunction, and pain.   ACTIVITY LIMITATIONS bending, standing, and locomotion level  PARTICIPATION LIMITATIONS: community activity and yard work/gardening  PERSONAL FACTORS Time since onset of injury/illness/exacerbation and 3+ comorbidities: Chronic fatigue syndrome, thoracic degenerative changes, bilat TKA, and multiple lumbar surgeries  are also affecting patient's functional outcome.   REHAB POTENTIAL: Fair due to chronic pain and multiple lumbar surgeries  CLINICAL DECISION MAKING: Evolving/moderate complexity  EVALUATION COMPLEXITY: Moderate   GOALS:  SHORT TERM GOALS:   Pt will tolerate aquatic therapy without adverse effects for improved pain, function, strength, and tolerance to activity.  Baseline: Goal status: INITIAL Target date:  05/21/2022  2.  Pt will report at least a 25% improvement in her posture with ambulation. Baseline:   Goal status: INITIAL Target Goal:  06/04/2022  3.  Pt will be independent with land based HEP for improved core strength, pain, and function.  Baseline:  Goal status: INITIAL Target Goal:  06/04/2022  4.  Pt will progress with aquatic exercises for improved core strength in order for improved posture with ambulation and standing activities.  Baseline:  Goal status: INITIAL Target goal:  06/04/2022   LONG TERM GOALS: Target date: 07/12/2022  Pt will report at least a 60% improvement in standing straighter with her daily activities, mobility, and ambulation.  Baseline:  Goal status: INITIAL  2.  Pt will report she is able to ambulate her normal community distance without significant pain.  Baseline:  Goal status: INITIAL  3.  Pt will report improved stability with her daily mobility and ambulation.  Baseline:  Goal status: INITIAL  4.  Pt will report at least a 50% improvement in pain with gardening and her normal yard work.  Baseline:  Goal status: INITIAL     PLAN: PT FREQUENCY: 2x/week  PT DURATION: 6 weeks  PLANNED INTERVENTIONS: Therapeutic exercises, Therapeutic activity, Neuromuscular re-education, Balance training, Gait training, Patient/Family education, Stair training, Aquatic Therapy, Dry Needling, Electrical stimulation, Cryotherapy, Moist heat, Taping, Manual therapy, and Re-evaluation.  PLAN FOR NEXT SESSION: Cont with aquatic therapy.    Mayer Camel, PTA 05/31/22 11:46 AM

## 2022-06-03 ENCOUNTER — Ambulatory Visit (HOSPITAL_BASED_OUTPATIENT_CLINIC_OR_DEPARTMENT_OTHER): Payer: Medicare Other | Admitting: Physical Therapy

## 2022-06-03 ENCOUNTER — Encounter (HOSPITAL_BASED_OUTPATIENT_CLINIC_OR_DEPARTMENT_OTHER): Payer: Self-pay | Admitting: Physical Therapy

## 2022-06-03 DIAGNOSIS — R262 Difficulty in walking, not elsewhere classified: Secondary | ICD-10-CM

## 2022-06-03 DIAGNOSIS — M5442 Lumbago with sciatica, left side: Secondary | ICD-10-CM | POA: Diagnosis not present

## 2022-06-03 DIAGNOSIS — M6281 Muscle weakness (generalized): Secondary | ICD-10-CM | POA: Diagnosis not present

## 2022-06-03 DIAGNOSIS — M5441 Lumbago with sciatica, right side: Secondary | ICD-10-CM | POA: Diagnosis not present

## 2022-06-03 DIAGNOSIS — M5459 Other low back pain: Secondary | ICD-10-CM | POA: Diagnosis not present

## 2022-06-03 DIAGNOSIS — G8929 Other chronic pain: Secondary | ICD-10-CM | POA: Diagnosis not present

## 2022-06-03 NOTE — Therapy (Signed)
OUTPATIENT PHYSICAL THERAPY THORACOLUMBAR TREATMENT   Patient Name: [name] [name] MRN: [id number] DOB:1941/01/30, 81 y.o., female Today's Date: 06/03/2022   PT End of Session - 06/03/22 1634     Visit Number 6    Number of Visits 12    Date for PT Re-Evaluation 06/18/22    Authorization Type Medicare A and B    PT Start Time 7673    PT Stop Time 1615    PT Time Calculation (min) 44 min    Activity Tolerance Patient tolerated treatment well    Behavior During Therapy WFL for tasks assessed/performed               Past Medical History:  Diagnosis Date   Acquired left flat foot    Acquired leg length discrepancy    Allergic urticaria    Allergy    Anemia    took iron for a while   Arthritis    lower back, knees   Blood transfusion without reported diagnosis    Bronchitis    history only   Cataract    Chronic fatigue syndrome    Chronic pain    lower back   Contusion of left hand    Female bladder prolapse    Fracture of metatarsal bone    w nonunion    Gout    Hallux rigidus    History of measles    History of mumps    History of rubella    Hyperlipidemia    Hypertension    Hypothyroidism    Imbalance    Macular degeneration    wet- right eye  dry- left eye   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[place])    Metatarsalgia of left foot    Missed ab    x 6 - 2 surgery, 4 resolved on it's own   Neuromuscular disorder ([place])    , left foot has some numbeness- uses cane   Numbness    outer edge of heel left foot    Osteopenia    Pneumonia    PONV (postoperative nausea and vomiting)    states she had scop patch in past,states she sets off alarms when she is waking up, she "forgets to breathe"    Seasonal allergies    Shingles    Tinnitus    Trigger middle finger of left hand    Ulcer    hx   Urinary incontinence    Past Surgical History:  Procedure Laterality Date   ABDOMINAL HYSTERECTOMY Bilateral 10/07/2014   Procedure: TOTAL ABDOMINAL HYSTERECTOMY  WITH BILATERAL SALPINGO OOPHORECTOMY AND HALBANS CULDOELASTY ;  Surgeon: Lyman Speller, MD;  Location: [facility];  Service: Gynecology;  Laterality: Bilateral;  Miller primary/Silva assist  4 hours total   ANTERIOR AND POSTERIOR REPAIR N/A 10/07/2014   Procedure: ANTERIOR (CYSTOCELE) AND POSTERIOR REPAIR (RECTOCELE);  Surgeon: Lyman Speller, MD;  Location: [facility];  Service: Gynecology;  Laterality: N/A;   APPENDECTOMY     BACK SURGERY  09,00,10,99, 6/14,9/14   x6 surgeries on back -Spinal Fusin L4-L5, Lumbar Fusion L1-S1   BLADDER SUSPENSION N/A 10/07/2014   Procedure: TRANSVAGINAL TAPE (TVT) EXACT PROCEDURE;  Surgeon: Lyman Speller, MD;  Location: [facility];  Service: Gynecology;  Laterality: N/A;   BREAST EXCISIONAL BIOPSY Left 1998   benign   BREAST SURGERY     left cyst- benign   BUNIONECTOMY  03/02/11 and 11/04/11   left foot x2   CARPAL TUNNEL RELEASE  both   CATARACT EXTRACTION Left 08/03/2019   CATARACT EXTRACTION Right 08/03/2019   CYSTOSCOPY N/A 10/07/2014   Procedure: CYSTOSCOPY;  Surgeon: Lyman Speller, MD;  Location: [facility];  Service: Gynecology;  Laterality: N/A;   DILATION AND CURETTAGE OF UTERUS     x2   Eye injections Left    HAMMER TOE SURGERY  4/12   lt foot-2-3   I&D and spina abscess L4-5     JOINT REPLACEMENT     R- TKA- 2013   KNEE ARTHROSCOPY  2007   both   LUMBAR LAMINECTOMY  05/1999   L4-L5   METATARSAL OSTEOTOMY  3/12   lt   ORIF TOE FRACTURE  11/04/2011   Procedure: OPEN REDUCTION INTERNAL FIXATION (ORIF) METATARSAL (TOE) FRACTURE;  Surgeon: Wylene Simmer, MD;  Location: [facility];  Service: Orthopedics;  Laterality: Left;  left revision orif 1st metatarsal with autograft from left calcaneous and left 2nd-3rd metatarsal weil osteotomy   osteoma  12   forehead x2   OTHER SURGICAL HISTORY  05/08/13   hardware removed from left foot   removal of two osteomas     on forehead-2008   STERIOD INJECTION Left 11/04/2015    Procedure: STEROID INJECTION;  Surgeon: Gaynelle Arabian, MD;  Location: [facility];  Service: Orthopedics;  Laterality: Left;   THUMB FUSION  2008   rt   TOTAL KNEE ARTHROPLASTY  08/29/2012   Procedure: TOTAL KNEE ARTHROPLASTY;  Surgeon: Hessie Dibble, MD;  Location: [facility];  Service: Orthopedics;  Laterality: Right;   TOTAL KNEE ARTHROPLASTY Left 05/17/2016   Procedure: LEFT TOTAL KNEE ARTHROPLASTY;  Surgeon: Gaynelle Arabian, MD;  Location: [facility];  Service: Orthopedics;  Laterality: Left;   TOTAL KNEE ARTHROPLASTY Right    TOTAL KNEE REVISION Right 11/04/2015   Procedure: TOTAL KNEE ARTHROPLASTY REVISION;  Surgeon: Gaynelle Arabian, MD;  Location: [facility];  Service: Orthopedics;  Laterality: Right;   TOTAL SHOULDER ARTHROPLASTY Right 05/09/2014   Procedure: TOTAL SHOULDER ARTHROPLASTY;  Surgeon: Nita Sells, MD;  Location: [facility];  Service: Orthopedics;  Laterality: Right;  Right shoulder arthroplasty   TUBAL LIGATION     Patient Active Problem List   Diagnosis Date Noted   Chronic low back pain 03/04/2022   Chronic kidney disease, stage 3b ([place]) 02/03/2022   History of total knee replacement, right 03/05/2018   Hypokalemia 05/19/2016   Sinus tachycardia 05/19/2016   OA (osteoarthritis) of knee 05/17/2016   Failed total right knee replacement ([place]) 11/04/2015   Failed total knee, right ([place]) 11/04/2015   S/P TAH (total abdominal hysterectomy) 10/08/2014   Pelvic prolapse 10/07/2014   Cystocele 08/16/2014   Glenohumeral arthritis 05/09/2014   Right knee DJD 08/29/2012    Class: Chronic    PCP: Tonna Boehringer, MD  REFERRING PROVIDER: Lyndal Pulley, DO   REFERRING DIAG: M54.42,M54.41,G89.29 (ICD-10-CM) - Chronic bilateral low back pain with bilateral sciatica   Rationale for Evaluation and Treatment Rehabilitation  THERAPY DIAG:  Other low back pain  Muscle weakness (generalized)  Difficulty in walking, not elsewhere classified  ONSET DATE: Chronic pain ; MD  visit/order 05/04/2022   SUBJECTIVE:  SUBJECTIVE STATEMENT: Pt reports no LBP.  "May be a little achy.But I haven't done anything today "  PAIN:  Are you having pain? Yes NPRS:  Current: /10 Location:  central lower lumbar ; (no radicular symptoms today) Type: constant; aching; can be sharp at times  PERTINENT HISTORY:  -Chronic back pain with x ray findings of significant DDD at T12-L1 and mild degenerative changes at T10-T11 and T11-T12 -Chronic fatigue syndrome, osteopenia, Cervical spondylosis, and Macular degeneratio -PSHx:  6 lumbar surgeries with L1-S1 fusion in 2014, bilat TKA, R TSA    PRECAUTIONS: Other: L1-S1 fusion in 2014, degenerative thoracic changes, osteopenia, bilat TKA, R TSA  WEIGHT BEARING RESTRICTIONS No  FALLS:  Has patient fallen in last 6 months? Yes. Number of falls 1; tripped over a large rock in her yard  LIVING ENVIRONMENT: Lives with: lives with their spouse Lives in: 1 level home Stairs: 4 steps to enter home with 1 rail Has following equipment at home: Single point cane and 3 wheeled walker  OCCUPATION: Pt is retired  PLOF: Independent; Pt has a Hx of lumbar pain. She has been using cane for 5-6 years.  PATIENT GOALS improve strength and balance.  Wants to be more steady on feet. To be able to stand straighter with ambulation   OBJECTIVE:   DIAGNOSTIC FINDINGS:  Xrays  IMPRESSION: 1. Postsurgical changes of prior PLIF with interbody grafts at each level and an interbody cage at L4-L5. No evidence of hardware complication. 2. Interval development of significant adjacent level degenerative disc disease at T12-L1. 3. More mild adjacent level degenerative changes also visualized at T10-T11 and T11-T12.  IMPRESSION: Exaggerated thoracic kyphosis with adjacent level degenerative disc disease at  T12-T11.  Changes of degenerative disc disease noted throughout the majority of the thoracic spine. No evidence of acute fracture.    TODAY'S TREATMENT   Pt seen for aquatic therapy today.  Treatment took place in water 3.25-4.8 ft in depth at the [facility] pool. Temp of water was 91.  Pt entered/exited the pool via stairs (step through pattern) independently with bilat rail.   Walking forward, Back, and side stepping -Cues for  upright posture [place] x 4 widths SLS x 10sec each ue supported yellow hand buoys x 20s hold ea 2 trial Tandem stance R/L ue support yellow hand buoys 2x20s hold ea Forward and backward walking  with horizontal head turns x 4 widths Shoulder horizontal add/abd x10 un resisted; repated resisted (with hand buoys submerged) cues for scapular retraction Elbow extension/tricep yellow hand buoy x10 R/L Plank on bench with leg lifts. Cues for abdominal bracing and glute squeeze for core engagement and scapular retraction  blue squoodle pull down 2 x 10.  Kickboard push/ pull at 10, 12, and 2 o'clock in staggered stance 2x20 Vertical suspension yellow noodle under arms posteriorly: cycling; scissoring and skiiing      Pt requires buoyancy for support and to offload joints with strengthening exercises. Viscosity of the water is needed for resistance of strengthening; water current perturbations provides challenge to standing balance unsupported, requiring increased core activationReviewed her current routine.   PATIENT EDUCATION:  Education details: Educated pt in exercise form, rationale of exercises.   Person educated: Patient Education method: Explanation Education comprehension: verbalized understanding   HOME EXERCISE PROGRAM: Access Code: [email or web address] URL: [email or web address] Date: 05/26/2022 Prepared by: Ronny Flurry  Exercises - Seated Scapular Retraction  - 1-2 x daily - 5-6 x weekly - 2 sets - 10 reps - 3-5 seconds  hold -  Seated Shoulder Row with Anchored Resistance  - 1 x daily - 3-4 x weekly - 2-3 sets - 10 reps - Hooklying Clamshell with Resistance  - 1 x daily - 4 x weekly - 2 sets - 10 reps - Supine Transversus Abdominis Bracing - Hands on Stomach  - 2 x daily - 7 x weekly - 2 sets - 10 reps - Seated Hamstring Stretch  - 2 x daily - 7 x weekly - 2 reps - 20 seconds hold  ASSESSMENT:  CLINICAL IMPRESSION: Pt with overall decrease in LBP improving from initial visits. Increased focus on posterior core glutes through scapula encouraging improved standing posture.  No c/o pain in LB with added planks. Added yellow hand buoys to balance challenges as pt unable to maintain SLS without. Was able  to gain good core engagement. Goals ongoing     OBJECTIVE IMPAIRMENTS Abnormal gait, decreased activity tolerance, decreased balance, decreased endurance, decreased mobility, difficulty walking, decreased ROM, decreased strength, impaired flexibility, postural dysfunction, and pain.   ACTIVITY LIMITATIONS bending, standing, and locomotion level  PARTICIPATION LIMITATIONS: community activity and yard work/gardening  PERSONAL FACTORS Time since onset of injury/illness/exacerbation and 3+ comorbidities: Chronic fatigue syndrome, thoracic degenerative changes, bilat TKA, and multiple lumbar surgeries  are also affecting patient's functional outcome.   REHAB POTENTIAL: Fair due to chronic pain and multiple lumbar surgeries  CLINICAL DECISION MAKING: Evolving/moderate complexity  EVALUATION COMPLEXITY: Moderate   GOALS:  SHORT TERM GOALS:   Pt will tolerate aquatic therapy without adverse effects for improved pain, function, strength, and tolerance to activity.  Baseline: Goal status: INITIAL Target date:  05/21/2022  2.  Pt will report at least a 25% improvement in her posture with ambulation. Baseline:  Goal status: INITIAL Target Goal:  06/04/2022  3.  Pt will be independent with land based HEP for improved  core strength, pain, and function.  Baseline:  Goal status: INITIAL Target Goal:  06/04/2022  4.  Pt will progress with aquatic exercises for improved core strength in order for improved posture with ambulation and standing activities.  Baseline:  Goal status: INITIAL Target goal:  06/04/2022   LONG TERM GOALS: Target date: 07/15/2022  Pt will report at least a 60% improvement in standing straighter with her daily activities, mobility, and ambulation.  Baseline:  Goal status: INITIAL  2.  Pt will report she is able to ambulate her normal community distance without significant pain.  Baseline:  Goal status: INITIAL  3.  Pt will report improved stability with her daily mobility and ambulation.  Baseline:  Goal status: INITIAL  4.  Pt will report at least a 50% improvement in pain with gardening and her normal yard work.  Baseline:  Goal status: INITIAL     PLAN: PT FREQUENCY: 2x/week  PT DURATION: 6 weeks  PLANNED INTERVENTIONS: Therapeutic exercises, Therapeutic activity, Neuromuscular re-education, Balance training, Gait training, Patient/Family education, Stair training, Aquatic Therapy, Dry Needling, Electrical stimulation, Cryotherapy, Moist heat, Taping, Manual therapy, and Re-evaluation.  PLAN FOR NEXT SESSION: Cont with aquatic therapy.    Stanton Kidney Tharon Aquas) Marisue Canion MPT 06/03/22 4:38 PM

## 2022-06-09 ENCOUNTER — Ambulatory Visit (HOSPITAL_BASED_OUTPATIENT_CLINIC_OR_DEPARTMENT_OTHER): Payer: Medicare Other | Attending: Family Medicine | Admitting: Physical Therapy

## 2022-06-09 ENCOUNTER — Encounter (HOSPITAL_BASED_OUTPATIENT_CLINIC_OR_DEPARTMENT_OTHER): Payer: Self-pay | Admitting: Physical Therapy

## 2022-06-09 DIAGNOSIS — R262 Difficulty in walking, not elsewhere classified: Secondary | ICD-10-CM | POA: Diagnosis present

## 2022-06-09 DIAGNOSIS — M6281 Muscle weakness (generalized): Secondary | ICD-10-CM | POA: Insufficient documentation

## 2022-06-09 DIAGNOSIS — M5459 Other low back pain: Secondary | ICD-10-CM | POA: Diagnosis not present

## 2022-06-09 NOTE — Therapy (Signed)
OUTPATIENT PHYSICAL THERAPY THORACOLUMBAR TREATMENT   Patient Name: [name] [name] MRN: [id number] DOB:[birth year], 81 y.o., female Today's Date: 06/09/2022   PT End of Session - 06/09/22 1040     Visit Number 7    Number of Visits 12    Date for PT Re-Evaluation 06/18/22    Authorization Type Medicare A and B    PT Start Time 1034    PT Stop Time 1112    PT Time Calculation (min) 38 min    Activity Tolerance Patient tolerated treatment well    Behavior During Therapy WFL for tasks assessed/performed               Past Medical History:  Diagnosis Date   Acquired left flat foot    Acquired leg length discrepancy    Allergic urticaria    Allergy    Anemia    took iron for a while   Arthritis    lower back, knees   Blood transfusion without reported diagnosis    Bronchitis    history only   Cataract    Chronic fatigue syndrome    Chronic pain    lower back   Contusion of left hand    Female bladder prolapse    Fracture of metatarsal bone    w nonunion    Gout    Hallux rigidus    History of measles    History of mumps    History of rubella    Hyperlipidemia    Hypertension    Hypothyroidism    Imbalance    Macular degeneration    wet- right eye  dry- left eye   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[place])    Metatarsalgia of left foot    Missed ab    x 6 - 2 surgery, 4 resolved on it's own   Neuromuscular disorder ([place])    , left foot has some numbeness- uses cane   Numbness    outer edge of heel left foot    Osteopenia    Pneumonia    PONV (postoperative nausea and vomiting)    states she had scop patch in past,states she sets off alarms when she is waking up, she "forgets to breathe"    Seasonal allergies    Shingles    Tinnitus    Trigger middle finger of left hand    Ulcer    hx   Urinary incontinence    Past Surgical History:  Procedure Laterality Date   ABDOMINAL HYSTERECTOMY Bilateral 10/07/2014   Procedure: TOTAL ABDOMINAL HYSTERECTOMY  WITH BILATERAL SALPINGO OOPHORECTOMY AND HALBANS CULDOELASTY ;  Surgeon: Lyman Speller, MD;  Location: [facility];  Service: Gynecology;  Laterality: Bilateral;  Miller primary/Silva assist  4 hours total   ANTERIOR AND POSTERIOR REPAIR N/A 10/07/2014   Procedure: ANTERIOR (CYSTOCELE) AND POSTERIOR REPAIR (RECTOCELE);  Surgeon: Lyman Speller, MD;  Location: [facility];  Service: Gynecology;  Laterality: N/A;   APPENDECTOMY     BACK SURGERY  09,00,10,99, 6/14,9/14   x6 surgeries on back -Spinal Fusin L4-L5, Lumbar Fusion L1-S1   BLADDER SUSPENSION N/A 10/07/2014   Procedure: TRANSVAGINAL TAPE (TVT) EXACT PROCEDURE;  Surgeon: Lyman Speller, MD;  Location: [facility];  Service: Gynecology;  Laterality: N/A;   BREAST EXCISIONAL BIOPSY Left 1998   benign   BREAST SURGERY     left cyst- benign   BUNIONECTOMY  03/02/11 and 11/04/11   left foot x2   CARPAL TUNNEL RELEASE  both   CATARACT EXTRACTION Left 08/03/2019   CATARACT EXTRACTION Right 08/03/2019   CYSTOSCOPY N/A 10/07/2014   Procedure: CYSTOSCOPY;  Surgeon: Lyman Speller, MD;  Location: [facility];  Service: Gynecology;  Laterality: N/A;   DILATION AND CURETTAGE OF UTERUS     x2   Eye injections Left    HAMMER TOE SURGERY  4/12   lt foot-2-3   I&D and spina abscess L4-5     JOINT REPLACEMENT     R- TKA- 2013   KNEE ARTHROSCOPY  2007   both   LUMBAR LAMINECTOMY  05/1999   L4-L5   METATARSAL OSTEOTOMY  3/12   lt   ORIF TOE FRACTURE  11/04/2011   Procedure: OPEN REDUCTION INTERNAL FIXATION (ORIF) METATARSAL (TOE) FRACTURE;  Surgeon: Wylene Simmer, MD;  Location: [facility];  Service: Orthopedics;  Laterality: Left;  left revision orif 1st metatarsal with autograft from left calcaneous and left 2nd-3rd metatarsal weil osteotomy   osteoma  12   forehead x2   OTHER SURGICAL HISTORY  05/08/13   hardware removed from left foot   removal of two osteomas     on forehead-2008   STERIOD INJECTION Left 11/04/2015    Procedure: STEROID INJECTION;  Surgeon: Gaynelle Arabian, MD;  Location: [facility];  Service: Orthopedics;  Laterality: Left;   THUMB FUSION  2008   rt   TOTAL KNEE ARTHROPLASTY  08/29/2012   Procedure: TOTAL KNEE ARTHROPLASTY;  Surgeon: Hessie Dibble, MD;  Location: [facility];  Service: Orthopedics;  Laterality: Right;   TOTAL KNEE ARTHROPLASTY Left 05/17/2016   Procedure: LEFT TOTAL KNEE ARTHROPLASTY;  Surgeon: Gaynelle Arabian, MD;  Location: [facility];  Service: Orthopedics;  Laterality: Left;   TOTAL KNEE ARTHROPLASTY Right    TOTAL KNEE REVISION Right 11/04/2015   Procedure: TOTAL KNEE ARTHROPLASTY REVISION;  Surgeon: Gaynelle Arabian, MD;  Location: [facility];  Service: Orthopedics;  Laterality: Right;   TOTAL SHOULDER ARTHROPLASTY Right 05/09/2014   Procedure: TOTAL SHOULDER ARTHROPLASTY;  Surgeon: Nita Sells, MD;  Location: [facility];  Service: Orthopedics;  Laterality: Right;  Right shoulder arthroplasty   TUBAL LIGATION     Patient Active Problem List   Diagnosis Date Noted   Chronic low back pain 03/04/2022   Chronic kidney disease, stage 3b ([place]) 02/03/2022   History of total knee replacement, right 03/05/2018   Hypokalemia 05/19/2016   Sinus tachycardia 05/19/2016   OA (osteoarthritis) of knee 05/17/2016   Failed total right knee replacement ([place]) 11/04/2015   Failed total knee, right ([place]) 11/04/2015   S/P TAH (total abdominal hysterectomy) 10/08/2014   Pelvic prolapse 10/07/2014   Cystocele 08/16/2014   Glenohumeral arthritis 05/09/2014   Right knee DJD 08/29/2012    Class: Chronic    PCP: Tonna Boehringer, MD  REFERRING PROVIDER: Lyndal Pulley, DO   REFERRING DIAG: M54.42,M54.41,G89.29 (ICD-10-CM) - Chronic bilateral low back pain with bilateral sciatica   Rationale for Evaluation and Treatment Rehabilitation  THERAPY DIAG:  Other low back pain  Muscle weakness (generalized)  Difficulty in walking, not elsewhere classified  ONSET DATE: Chronic pain ; MD  visit/order 05/04/2022   SUBJECTIVE:  SUBJECTIVE STATEMENT: Pt reports she had a late night.  She is sore today. Woke up at 8/10, but it has eased up some.  She has noticed that she is able to stand up straighter when she walks, since starting therapy.   PAIN:  Are you having pain? Yes NPRS:  Current: 6/10 Location:  central lower lumbar ; (no radicular symptoms today) Type: constant; aching; can be sharp at times  PERTINENT HISTORY:  -Chronic back pain with x ray findings of significant DDD at T12-L1 and mild degenerative changes at T10-T11 and T11-T12 -Chronic fatigue syndrome, osteopenia, Cervical spondylosis, and Macular degeneratio -PSHx:  6 lumbar surgeries with L1-S1 fusion in 2014, bilat TKA, R TSA    PRECAUTIONS: Other: L1-S1 fusion in 2014, degenerative thoracic changes, osteopenia, bilat TKA, R TSA  WEIGHT BEARING RESTRICTIONS No  FALLS:  Has patient fallen in last 6 months? Yes. Number of falls 1; tripped over a large rock in her yard  LIVING ENVIRONMENT: Lives with: lives with their spouse Lives in: 1 level home Stairs: 4 steps to enter home with 1 rail Has following equipment at home: Single point cane and 3 wheeled walker  OCCUPATION: Pt is retired  PLOF: Independent; Pt has a Hx of lumbar pain. She has been using cane for 5-6 years.  PATIENT GOALS improve strength and balance.  Wants to be more steady on feet. To be able to stand straighter with ambulation   OBJECTIVE:   DIAGNOSTIC FINDINGS:  Xrays  IMPRESSION: 1. Postsurgical changes of prior PLIF with interbody grafts at each level and an interbody cage at L4-L5. No evidence of hardware complication. 2. Interval development of significant adjacent level degenerative disc disease at T12-L1. 3. More mild adjacent level degenerative changes also visualized  at T10-T11 and T11-T12.  IMPRESSION: Exaggerated thoracic kyphosis with adjacent level degenerative disc disease at T12-T11.  Changes of degenerative disc disease noted throughout the majority of the thoracic spine. No evidence of acute fracture.    TODAY'S TREATMENT   Pt seen for aquatic therapy today.  Treatment took place in water 3.25-4.8 ft in depth at the [facility] pool. Temp of water was 91.  Pt entered/exited the pool via stairs (step through pattern) independently with bilat rail.   Walking forward, Back, and side stepping -Cues for  upright posture  blue squoodle pull down 2 x 10 Tandem stance R/L UE support squoodle 20s hold ea [place] x 2 widths SLS x 10sec each UE supported squoodle x 20s hold ea 2 trial Yellow hand buoys at side with forward/ backward marching Shoulder horizontal add/abd x10  Vertical suspension yellow noodle under arms posteriorly: cycling; scissoring and skiiing  Plank on bench with leg lifts. Cues for abdominal bracing and glute squeeze for core engagement and scapular retraction Sit to/from stand at bench x 10 with core engaged Kickboard push/ pull at 10, 12, and 2 o'clock in staggered stance x 20     Pt requires buoyancy for support and to offload joints with strengthening exercises. Viscosity of the water is needed for resistance of strengthening; water current perturbations provides challenge to standing balance unsupported, requiring increased core activationReviewed her current routine.   PATIENT EDUCATION:  Education details: Educated pt in exercise form, rationale of exercises.   Person educated: Patient Education method: Explanation Education comprehension: verbalized understanding   HOME EXERCISE PROGRAM: Access Code: [email or web address] URL: [email or web address] Date: 05/26/2022 Prepared by: Ronny Flurry  Exercises - Seated Scapular Retraction  - 1-2 x daily - 5-6 x  weekly - 2 sets - 10 reps - 3-5 seconds  hold - Seated Shoulder Row with Anchored Resistance  - 1 x daily - 3-4 x weekly - 2-3 sets - 10 reps - Hooklying Clamshell with Resistance  - 1 x daily - 4 x weekly - 2 sets - 10 reps - Supine Transversus Abdominis Bracing - Hands on Stomach  - 2 x daily - 7 x weekly - 2 sets - 10 reps - Seated Hamstring Stretch  - 2 x daily - 7 x weekly - 2 reps - 20 seconds hold  ASSESSMENT:  CLINICAL IMPRESSION: Pt reported increase in Rt knee tightness with marching; she attributes this to prolonged standing in kitchen yesterday. She reported slight increase in back discomfort with exercise in water < 12f; gradually improved with increased time/ depth of water. She reported she could feel good engagement of core. Occasional cues for more upright trunk.  Progressing towards all goals.     OBJECTIVE IMPAIRMENTS Abnormal gait, decreased activity tolerance, decreased balance, decreased endurance, decreased mobility, difficulty walking, decreased ROM, decreased strength, impaired flexibility, postural dysfunction, and pain.   ACTIVITY LIMITATIONS bending, standing, and locomotion level  PARTICIPATION LIMITATIONS: community activity and yard work/gardening  PERSONAL FACTORS Time since onset of injury/illness/exacerbation and 3+ comorbidities: Chronic fatigue syndrome, thoracic degenerative changes, bilat TKA, and multiple lumbar surgeries  are also affecting patient's functional outcome.   REHAB POTENTIAL: Fair due to chronic pain and multiple lumbar surgeries  CLINICAL DECISION MAKING: Evolving/moderate complexity  EVALUATION COMPLEXITY: Moderate   GOALS:  SHORT TERM GOALS:   Pt will tolerate aquatic therapy without adverse effects for improved pain, function, strength, and tolerance to activity.  Baseline: Goal status: Achieved Target date:  05/21/2022  2.  Pt will report at least a 25% improvement in her posture with ambulation. Baseline:  Goal status: Achieved Target Goal:  06/04/2022  3.   Pt will be independent with land based HEP for improved core strength, pain, and function.  Baseline:  Goal status: Ongoing Target Goal:  06/04/2022  4.  Pt will progress with aquatic exercises for improved core strength in order for improved posture with ambulation and standing activities.  Baseline:  Goal status: Achieved Target goal:  06/04/2022   LONG TERM GOALS: Target date: 07/21/2022  Pt will report at least a 60% improvement in standing straighter with her daily activities, mobility, and ambulation.  Baseline:  Goal status: INITIAL  2.  Pt will report she is able to ambulate her normal community distance without significant pain.  Baseline:  Goal status: INITIAL  3.  Pt will report improved stability with her daily mobility and ambulation.  Baseline:  Goal status: INITIAL  4.  Pt will report at least a 50% improvement in pain with gardening and her normal yard work.  Baseline:  Goal status: INITIAL     PLAN: PT FREQUENCY: 2x/week  PT DURATION: 6 weeks  PLANNED INTERVENTIONS: Therapeutic exercises, Therapeutic activity, Neuromuscular re-education, Balance training, Gait training, Patient/Family education, Stair training, Aquatic Therapy, Dry Needling, Electrical stimulation, Cryotherapy, Moist heat, Taping, Manual therapy, and Re-evaluation.  PLAN FOR NEXT SESSION: Cont with aquatic therapy.   JKerin Perna PTA 06/09/22 12:57 PM

## 2022-06-11 ENCOUNTER — Ambulatory Visit (HOSPITAL_BASED_OUTPATIENT_CLINIC_OR_DEPARTMENT_OTHER): Payer: Medicare Other | Admitting: Physical Therapy

## 2022-06-11 ENCOUNTER — Encounter (HOSPITAL_BASED_OUTPATIENT_CLINIC_OR_DEPARTMENT_OTHER): Payer: Self-pay | Admitting: Physical Therapy

## 2022-06-11 DIAGNOSIS — M5459 Other low back pain: Secondary | ICD-10-CM | POA: Diagnosis not present

## 2022-06-11 DIAGNOSIS — R262 Difficulty in walking, not elsewhere classified: Secondary | ICD-10-CM

## 2022-06-11 DIAGNOSIS — M6281 Muscle weakness (generalized): Secondary | ICD-10-CM

## 2022-06-11 NOTE — Therapy (Signed)
OUTPATIENT PHYSICAL THERAPY THORACOLUMBAR TREATMENT   Patient Name: [name] [name] MRN: [id number] DOB:[birth year], 81 y.o., female Today's Date: 06/11/2022   PT End of Session - 06/11/22 1239     Visit Number 8    Number of Visits 12    Date for PT Re-Evaluation 06/18/22    Authorization Type Medicare A and B    PT Start Time 1115    PT Stop Time 1202    PT Time Calculation (min) 47 min    Activity Tolerance Patient tolerated treatment well    Behavior During Therapy WFL for tasks assessed/performed                Past Medical History:  Diagnosis Date   Acquired left flat foot    Acquired leg length discrepancy    Allergic urticaria    Allergy    Anemia    took iron for a while   Arthritis    lower back, knees   Blood transfusion without reported diagnosis    Bronchitis    history only   Cataract    Chronic fatigue syndrome    Chronic pain    lower back   Contusion of left hand    Female bladder prolapse    Fracture of metatarsal bone    w nonunion    Gout    Hallux rigidus    History of measles    History of mumps    History of rubella    Hyperlipidemia    Hypertension    Hypothyroidism    Imbalance    Macular degeneration    wet- right eye  dry- left eye   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[place])    Metatarsalgia of left foot    Missed ab    x 6 - 2 surgery, 4 resolved on it's own   Neuromuscular disorder ([place])    , left foot has some numbeness- uses cane   Numbness    outer edge of heel left foot    Osteopenia    Pneumonia    PONV (postoperative nausea and vomiting)    states she had scop patch in past,states she sets off alarms when she is waking up, she "forgets to breathe"    Seasonal allergies    Shingles    Tinnitus    Trigger middle finger of left hand    Ulcer    hx   Urinary incontinence    Past Surgical History:  Procedure Laterality Date   ABDOMINAL HYSTERECTOMY Bilateral 10/07/2014   Procedure: TOTAL ABDOMINAL HYSTERECTOMY  WITH BILATERAL SALPINGO OOPHORECTOMY AND HALBANS CULDOELASTY ;  Surgeon: Lyman Speller, MD;  Location: [facility];  Service: Gynecology;  Laterality: Bilateral;  Miller primary/Silva assist  4 hours total   ANTERIOR AND POSTERIOR REPAIR N/A 10/07/2014   Procedure: ANTERIOR (CYSTOCELE) AND POSTERIOR REPAIR (RECTOCELE);  Surgeon: Lyman Speller, MD;  Location: [facility];  Service: Gynecology;  Laterality: N/A;   APPENDECTOMY     BACK SURGERY  09,00,10,99, 6/14,9/14   x6 surgeries on back -Spinal Fusin L4-L5, Lumbar Fusion L1-S1   BLADDER SUSPENSION N/A 10/07/2014   Procedure: TRANSVAGINAL TAPE (TVT) EXACT PROCEDURE;  Surgeon: Lyman Speller, MD;  Location: [facility];  Service: Gynecology;  Laterality: N/A;   BREAST EXCISIONAL BIOPSY Left 1998   benign   BREAST SURGERY     left cyst- benign   BUNIONECTOMY  03/02/11 and 11/04/11   left foot x2   CARPAL TUNNEL RELEASE  both   CATARACT EXTRACTION Left 08/03/2019   CATARACT EXTRACTION Right 08/03/2019   CYSTOSCOPY N/A 10/07/2014   Procedure: CYSTOSCOPY;  Surgeon: Lyman Speller, MD;  Location: [facility];  Service: Gynecology;  Laterality: N/A;   DILATION AND CURETTAGE OF UTERUS     x2   Eye injections Left    HAMMER TOE SURGERY  4/12   lt foot-2-3   I&D and spina abscess L4-5     JOINT REPLACEMENT     R- TKA- 2013   KNEE ARTHROSCOPY  2007   both   LUMBAR LAMINECTOMY  05/1999   L4-L5   METATARSAL OSTEOTOMY  3/12   lt   ORIF TOE FRACTURE  11/04/2011   Procedure: OPEN REDUCTION INTERNAL FIXATION (ORIF) METATARSAL (TOE) FRACTURE;  Surgeon: Wylene Simmer, MD;  Location: [facility];  Service: Orthopedics;  Laterality: Left;  left revision orif 1st metatarsal with autograft from left calcaneous and left 2nd-3rd metatarsal weil osteotomy   osteoma  12   forehead x2   OTHER SURGICAL HISTORY  05/08/13   hardware removed from left foot   removal of two osteomas     on forehead-2008   STERIOD INJECTION Left 11/04/2015    Procedure: STEROID INJECTION;  Surgeon: Gaynelle Arabian, MD;  Location: [facility];  Service: Orthopedics;  Laterality: Left;   THUMB FUSION  2008   rt   TOTAL KNEE ARTHROPLASTY  08/29/2012   Procedure: TOTAL KNEE ARTHROPLASTY;  Surgeon: Hessie Dibble, MD;  Location: [facility];  Service: Orthopedics;  Laterality: Right;   TOTAL KNEE ARTHROPLASTY Left 05/17/2016   Procedure: LEFT TOTAL KNEE ARTHROPLASTY;  Surgeon: Gaynelle Arabian, MD;  Location: [facility];  Service: Orthopedics;  Laterality: Left;   TOTAL KNEE ARTHROPLASTY Right    TOTAL KNEE REVISION Right 11/04/2015   Procedure: TOTAL KNEE ARTHROPLASTY REVISION;  Surgeon: Gaynelle Arabian, MD;  Location: [facility];  Service: Orthopedics;  Laterality: Right;   TOTAL SHOULDER ARTHROPLASTY Right 05/09/2014   Procedure: TOTAL SHOULDER ARTHROPLASTY;  Surgeon: Nita Sells, MD;  Location: [facility];  Service: Orthopedics;  Laterality: Right;  Right shoulder arthroplasty   TUBAL LIGATION     Patient Active Problem List   Diagnosis Date Noted   Chronic low back pain 03/04/2022   Chronic kidney disease, stage 3b ([place]) 02/03/2022   History of total knee replacement, right 03/05/2018   Hypokalemia 05/19/2016   Sinus tachycardia 05/19/2016   OA (osteoarthritis) of knee 05/17/2016   Failed total right knee replacement ([place]) 11/04/2015   Failed total knee, right ([place]) 11/04/2015   S/P TAH (total abdominal hysterectomy) 10/08/2014   Pelvic prolapse 10/07/2014   Cystocele 08/16/2014   Glenohumeral arthritis 05/09/2014   Right knee DJD 08/29/2012    Class: Chronic    PCP: Tonna Boehringer, MD  REFERRING PROVIDER: Lyndal Pulley, DO   REFERRING DIAG: M54.42,M54.41,G89.29 (ICD-10-CM) - Chronic bilateral low back pain with bilateral sciatica   Rationale for Evaluation and Treatment Rehabilitation  THERAPY DIAG:  Other low back pain  Muscle weakness (generalized)  Difficulty in walking, not elsewhere classified  ONSET DATE: Chronic pain ; MD  visit/order 05/04/2022   SUBJECTIVE:  SUBJECTIVE STATEMENT: Pt reports she spent an hour watering her garden prior to session.   She has some R knee stiffness which she attributes to swelling.   PAIN:  Are you having pain? Yes NPRS:  Current: 5/10 Location:  central lower lumbar ; (no radicular symptoms today) Type: constant; aching; can be sharp at times  PERTINENT HISTORY:  -Chronic back pain with x ray findings of significant DDD at T12-L1 and mild degenerative changes at T10-T11 and T11-T12 -Chronic fatigue syndrome, osteopenia, Cervical spondylosis, and Macular degeneratio -PSHx:  6 lumbar surgeries with L1-S1 fusion in 2014, bilat TKA, R TSA    PRECAUTIONS: Other: L1-S1 fusion in 2014, degenerative thoracic changes, osteopenia, bilat TKA, R TSA  WEIGHT BEARING RESTRICTIONS No  FALLS:  Has patient fallen in last 6 months? Yes. Number of falls 1; tripped over a large rock in her yard  LIVING ENVIRONMENT: Lives with: lives with their spouse Lives in: 1 level home Stairs: 4 steps to enter home with 1 rail Has following equipment at home: Single point cane and 3 wheeled walker  OCCUPATION: Pt is retired  PLOF: Independent; Pt has a Hx of lumbar pain. She has been using cane for 5-6 years.  PATIENT GOALS improve strength and balance.  Wants to be more steady on feet. To be able to stand straighter with ambulation   OBJECTIVE:   DIAGNOSTIC FINDINGS:  Xrays  IMPRESSION: 1. Postsurgical changes of prior PLIF with interbody grafts at each level and an interbody cage at L4-L5. No evidence of hardware complication. 2. Interval development of significant adjacent level degenerative disc disease at T12-L1. 3. More mild adjacent level degenerative changes also visualized at T10-T11 and T11-T12.  IMPRESSION: Exaggerated thoracic  kyphosis with adjacent level degenerative disc disease at T12-T11.  Changes of degenerative disc disease noted throughout the majority of the thoracic spine. No evidence of acute fracture.    TODAY'S TREATMENT   Pt seen for aquatic therapy today.  Treatment took place in water 3.25-4.8 ft in depth at the [facility] pool. Temp of water was 91.  Pt entered/exited the pool via stairs (step through pattern) independently with bilat rail.   Walking forward, Back, and side stepping -Cues for  upright posture  [place] x 2 laps, cues for relaxed knees when crossing  blue squoodle pull down x15 Kickboard push/ pull at 10, 12, and 2 o'clock in staggered stance x 20 Plank on bench with alternating hip ext x 2 x 10 Vertical suspension yellow noodle under arms posteriorly: cycling; scissoring and skiiing; bilat clams with feet perched on floor Holding yellow noodle: stork stance with hip IR/ER Yellow hand buoys at side with forward/ backward walking (some increase in back pain) Shoulder horizontal add/abd x10 with hand buoys on top of water With red paddles on hands, in staggered stance:  shoulder abdct/ add; horiz abdct/ add; bicep/tricep curls; shoulder flex/ext to neutral;  forward walking with hands at side; side stepping with shoulder abdct/ add     Pt requires buoyancy for support and to offload joints with strengthening exercises. Viscosity of the water is needed for resistance of strengthening; water current perturbations provides challenge to standing balance unsupported, requiring increased core activationReviewed her current routine.   PATIENT EDUCATION:  Education details: Educated pt in exercise form, rationale of exercises.   Person educated: Patient Education method: Explanation Education comprehension: verbalized understanding   HOME EXERCISE PROGRAM: Access Code: [email or web address] URL: [email or web address] Date: 05/26/2022 Prepared by: Ronny Flurry  Exercises - Seated  Scapular Retraction  - 1-2 x daily - 5-6 x weekly - 2 sets - 10 reps - 3-5 seconds hold - Seated Shoulder Row with Anchored Resistance  - 1 x daily - 3-4 x weekly - 2-3 sets - 10 reps - Hooklying Clamshell with Resistance  - 1 x daily - 4 x weekly - 2 sets - 10 reps - Supine Transversus Abdominis Bracing - Hands on Stomach  - 2 x daily - 7 x weekly - 2 sets - 10 reps - Seated Hamstring Stretch  - 2 x daily - 7 x weekly - 2 reps - 20 seconds hold  ASSESSMENT:  CLINICAL IMPRESSION: Pt reported increased back pain with forward walking with yellow hand buoys under water;  improved change in exercise. She reported she could feel good engagement of core. Occasional cues required for more upright trunk.  Pt reported good reduction of LBP while exercising in water; symptoms have centralized.  Progressing towards all goals.     OBJECTIVE IMPAIRMENTS Abnormal gait, decreased activity tolerance, decreased balance, decreased endurance, decreased mobility, difficulty walking, decreased ROM, decreased strength, impaired flexibility, postural dysfunction, and pain.   ACTIVITY LIMITATIONS bending, standing, and locomotion level  PARTICIPATION LIMITATIONS: community activity and yard work/gardening  PERSONAL FACTORS Time since onset of injury/illness/exacerbation and 3+ comorbidities: Chronic fatigue syndrome, thoracic degenerative changes, bilat TKA, and multiple lumbar surgeries  are also affecting patient's functional outcome.   REHAB POTENTIAL: Fair due to chronic pain and multiple lumbar surgeries  CLINICAL DECISION MAKING: Evolving/moderate complexity  EVALUATION COMPLEXITY: Moderate   GOALS:  SHORT TERM GOALS:   Pt will tolerate aquatic therapy without adverse effects for improved pain, function, strength, and tolerance to activity.  Baseline: Goal status: Achieved Target date:  05/21/2022  2.  Pt will report at least a 25% improvement in her posture with  ambulation. Baseline:  Goal status: Achieved Target Goal:  06/04/2022  3.  Pt will be independent with land based HEP for improved core strength, pain, and function.  Baseline:  Goal status: Ongoing Target Goal:  06/04/2022  4.  Pt will progress with aquatic exercises for improved core strength in order for improved posture with ambulation and standing activities.  Baseline:  Goal status: Achieved Target goal:  06/04/2022   LONG TERM GOALS: Target date: 07/23/2022  Pt will report at least a 60% improvement in standing straighter with her daily activities, mobility, and ambulation.  Baseline:  Goal status: INITIAL  2.  Pt will report she is able to ambulate her normal community distance without significant pain.  Baseline:  Goal status: INITIAL  3.  Pt will report improved stability with her daily mobility and ambulation.  Baseline:  Goal status: INITIAL  4.  Pt will report at least a 50% improvement in pain with gardening and her normal yard work.  Baseline:  Goal status: Ongoing      PLAN: PT FREQUENCY: 2x/week  PT DURATION: 6 weeks  PLANNED INTERVENTIONS: Therapeutic exercises, Therapeutic activity, Neuromuscular re-education, Balance training, Gait training, Patient/Family education, Stair training, Aquatic Therapy, Dry Needling, Electrical stimulation, Cryotherapy, Moist heat, Taping, Manual therapy, and Re-evaluation.  PLAN FOR NEXT SESSION: Cont with aquatic therapy.   Kerin Perna, PTA 06/11/22 12:42 PM

## 2022-06-13 NOTE — Therapy (Signed)
OUTPATIENT PHYSICAL THERAPY THORACOLUMBAR TREATMENT   Patient Name: [name] [name] MRN: [id number] DOB:[birth year], 81 y.o., female Today's Date: 06/13/2022        Past Medical History:  Diagnosis Date   Acquired left flat foot    Acquired leg length discrepancy    Allergic urticaria    Allergy    Anemia    took iron for a while   Arthritis    lower back, knees   Blood transfusion without reported diagnosis    Bronchitis    history only   Cataract    Chronic fatigue syndrome    Chronic pain    lower back   Contusion of left hand    Female bladder prolapse    Fracture of metatarsal bone    w nonunion    Gout    Hallux rigidus    History of measles    History of mumps    History of rubella    Hyperlipidemia    Hypertension    Hypothyroidism    Imbalance    Macular degeneration    wet- right eye  dry- left eye   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[place])    Metatarsalgia of left foot    Missed ab    x 6 - 2 surgery, 4 resolved on it's own   Neuromuscular disorder ([place])    , left foot has some numbeness- uses cane   Numbness    outer edge of heel left foot    Osteopenia    Pneumonia    PONV (postoperative nausea and vomiting)    states she had scop patch in past,states she sets off alarms when she is waking up, she "forgets to breathe"    Seasonal allergies    Shingles    Tinnitus    Trigger middle finger of left hand    Ulcer    hx   Urinary incontinence    Past Surgical History:  Procedure Laterality Date   ABDOMINAL HYSTERECTOMY Bilateral 10/07/2014   Procedure: TOTAL ABDOMINAL HYSTERECTOMY WITH BILATERAL SALPINGO OOPHORECTOMY AND HALBANS CULDOELASTY ;  Surgeon: Lyman Speller, MD;  Location: [facility];  Service: Gynecology;  Laterality: Bilateral;  Miller primary/Silva assist  4 hours total   ANTERIOR AND POSTERIOR REPAIR N/A 10/07/2014   Procedure: ANTERIOR (CYSTOCELE) AND POSTERIOR REPAIR (RECTOCELE);  Surgeon: Lyman Speller, MD;  Location: [facility];  Service: Gynecology;  Laterality: N/A;   APPENDECTOMY     BACK SURGERY  09,00,10,99, 6/14,9/14   x6 surgeries on back -Spinal Fusin L4-L5, Lumbar Fusion L1-S1   BLADDER SUSPENSION N/A 10/07/2014   Procedure: TRANSVAGINAL TAPE (TVT) EXACT PROCEDURE;  Surgeon: Lyman Speller, MD;  Location: [facility];  Service: Gynecology;  Laterality: N/A;   BREAST EXCISIONAL BIOPSY Left 1998   benign   BREAST SURGERY     left cyst- benign   BUNIONECTOMY  03/02/11 and 11/04/11   left foot x2   CARPAL TUNNEL RELEASE     both   CATARACT EXTRACTION Left 08/03/2019   CATARACT EXTRACTION Right 08/03/2019   CYSTOSCOPY N/A 10/07/2014   Procedure: CYSTOSCOPY;  Surgeon: Lyman Speller, MD;  Location: [facility];  Service: Gynecology;  Laterality: N/A;   DILATION AND CURETTAGE OF UTERUS     x2   Eye injections Left    HAMMER TOE SURGERY  4/12   lt foot-2-3   I&D and spina abscess L4-5     JOINT REPLACEMENT     R- TKA- 2013  KNEE ARTHROSCOPY  2007   both   LUMBAR LAMINECTOMY  05/1999   L4-L5   METATARSAL OSTEOTOMY  3/12   lt   ORIF TOE FRACTURE  11/04/2011   Procedure: OPEN REDUCTION INTERNAL FIXATION (ORIF) METATARSAL (TOE) FRACTURE;  Surgeon: Wylene Simmer, MD;  Location: [facility];  Service: Orthopedics;  Laterality: Left;  left revision orif 1st metatarsal with autograft from left calcaneous and left 2nd-3rd metatarsal weil osteotomy   osteoma  12   forehead x2   OTHER SURGICAL HISTORY  05/08/13   hardware removed from left foot   removal of two osteomas     on forehead-2008   STERIOD INJECTION Left 11/04/2015   Procedure: STEROID INJECTION;  Surgeon: Gaynelle Arabian, MD;  Location: [facility];  Service: Orthopedics;  Laterality: Left;   THUMB FUSION  2008   rt   TOTAL KNEE ARTHROPLASTY  08/29/2012   Procedure: TOTAL KNEE ARTHROPLASTY;  Surgeon: Hessie Dibble, MD;  Location: [facility];  Service: Orthopedics;  Laterality: Right;   TOTAL KNEE ARTHROPLASTY Left 05/17/2016   Procedure:  LEFT TOTAL KNEE ARTHROPLASTY;  Surgeon: Gaynelle Arabian, MD;  Location: [facility];  Service: Orthopedics;  Laterality: Left;   TOTAL KNEE ARTHROPLASTY Right    TOTAL KNEE REVISION Right 11/04/2015   Procedure: TOTAL KNEE ARTHROPLASTY REVISION;  Surgeon: Gaynelle Arabian, MD;  Location: [facility];  Service: Orthopedics;  Laterality: Right;   TOTAL SHOULDER ARTHROPLASTY Right 05/09/2014   Procedure: TOTAL SHOULDER ARTHROPLASTY;  Surgeon: Nita Sells, MD;  Location: [facility];  Service: Orthopedics;  Laterality: Right;  Right shoulder arthroplasty   TUBAL LIGATION     Patient Active Problem List   Diagnosis Date Noted   Chronic low back pain 03/04/2022   Chronic kidney disease, stage 3b ([place]) 02/03/2022   History of total knee replacement, right 03/05/2018   Hypokalemia 05/19/2016   Sinus tachycardia 05/19/2016   OA (osteoarthritis) of knee 05/17/2016   Failed total right knee replacement ([place]) 11/04/2015   Failed total knee, right ([place]) 11/04/2015   S/P TAH (total abdominal hysterectomy) 10/08/2014   Pelvic prolapse 10/07/2014   Cystocele 08/16/2014   Glenohumeral arthritis 05/09/2014   Right knee DJD 08/29/2012    Class: Chronic    PCP: Tonna Boehringer, MD  REFERRING PROVIDER: Lyndal Pulley, DO   REFERRING DIAG: M54.42,M54.41,G89.29 (ICD-10-CM) - Chronic bilateral low back pain with bilateral sciatica   Rationale for Evaluation and Treatment Rehabilitation  THERAPY DIAG:  No diagnosis found.  ONSET DATE: Chronic pain ; MD visit/order 05/04/2022   SUBJECTIVE:                                                                                                                                                   SUBJECTIVE STATEMENT: Pt states she loves the pool.  Pt denies any adverse effects  after prior Rx.  Pt states she is improving.  Her back feels better.  Pt did some gardening on Saturday which would typically cause lumbar pain though she did not have any on Saturday.  She did  however have R shoulder and R knee pain the following day.  Pt reports she has been performing her HEP "mostly".  Pt states she is a little better with performing supine TrA contraction.   PAIN:  Are you having pain? Yes NPRS:  Current: 3/10 Location:  central lower lumbar ; (no radicular symptoms today) Type: constant; aching; can be sharp at times  PERTINENT HISTORY:  -LATEX ALLERGY -Chronic back pain with x ray findings of significant DDD at T12-L1 and mild degenerative changes at T10-T11 and T11-T12 -Chronic fatigue syndrome, osteopenia, Cervical spondylosis, and Macular degeneratio -PSHx:  6 lumbar surgeries with L1-S1 fusion in 2014, bilat TKA, R TSA    PRECAUTIONS: Other: L1-S1 fusion in 2014, degenerative thoracic changes, osteopenia, bilat TKA, R TSA.  Pt has a LATEX ALLERGY.  WEIGHT BEARING RESTRICTIONS No  FALLS:  Has patient fallen in last 6 months? Yes. Number of falls 1; tripped over a large rock in her yard  LIVING ENVIRONMENT: Lives with: lives with their spouse Lives in: 1 level home Stairs: 4 steps to enter home with 1 rail Has following equipment at home: Single point cane and 3 wheeled walker  OCCUPATION: Pt is retired  PLOF: Independent; Pt has a Hx of lumbar pain. She has been using cane for 5-6 years.  PATIENT GOALS improve strength and balance.  Wants to be more steady on feet. To be able to stand straighter with ambulation   OBJECTIVE:   DIAGNOSTIC FINDINGS:  Xrays  IMPRESSION: 1. Postsurgical changes of prior PLIF with interbody grafts at each level and an interbody cage at L4-L5. No evidence of hardware complication. 2. Interval development of significant adjacent level degenerative disc disease at T12-L1. 3. More mild adjacent level degenerative changes also visualized at T10-T11 and T11-T12.  IMPRESSION: Exaggerated thoracic kyphosis with adjacent level degenerative disc disease at T12-T11.  Changes of degenerative disc disease noted  throughout the majority of the thoracic spine. No evidence of acute fracture.    TODAY'S TREATMENT   Reviewed her current routine.  PT established HEP. Educated pt on TrA contraction and palpation.  Pt performed TrA contractions.  Pt performed: Scap retraction with 3-5 sec hold x 10 reps  Seated Rows with retraction with GTB 2x10 reps Supine clamshells with BTB 2x10 reps.  Supine marching with TrA 2x10 reps Supine SLR 2x10 reps Seated HS stretch 2x 20 seconds bilat Pt seen for aquatic therapy today.  Treatment took place in water 3.25-4.8 ft in depth at the [facility] pool. Temp of water was 91.  Pt entered/exited the pool via stairs (step through pattern) independently with bilat rail.   Walking forward, Back, and side stepping -Cues for  upright posture  [place] x 2 laps, cues for relaxed knees when crossing  blue squoodle pull down x15 Kickboard push/ pull at 10, 12, and 2 o'clock in staggered stance x 20 Plank on bench with alternating hip ext x 2 x 10 Vertical suspension yellow noodle under arms posteriorly: cycling; scissoring and skiiing; bilat clams with feet perched on floor Holding yellow noodle: stork stance with hip IR/ER Yellow hand buoys at side with forward/ backward walking (some increase in back pain) Shoulder horizontal add/abd x10 with hand buoys on top of water With red paddles on hands,  in staggered stance:  shoulder abdct/ add; horiz abdct/ add; bicep/tricep curls; shoulder flex/ext to neutral;  forward walking with hands at side; side stepping with shoulder abdct/ add     Pt requires buoyancy for support and to offload joints with strengthening exercises. Viscosity of the water is needed for resistance of strengthening; water current perturbations provides challenge to standing balance unsupported, requiring increased core activationReviewed her current routine.   PATIENT EDUCATION:  Education details: Educated pt in exercise form, rationale of  exercises.   Person educated: Patient Education method: Explanation Education comprehension: verbalized understanding   HOME EXERCISE PROGRAM: Access Code: [email or web address] URL: [email or web address] Date: 05/26/2022 Prepared by: Ronny Flurry  Exercises - Seated Scapular Retraction  - 1-2 x daily - 5-6 x weekly - 2 sets - 10 reps - 3-5 seconds hold - Seated Shoulder Row with Anchored Resistance  - 1 x daily - 3-4 x weekly - 2-3 sets - 10 reps - Hooklying Clamshell with Resistance  - 1 x daily - 4 x weekly - 2 sets - 10 reps - Supine Transversus Abdominis Bracing - Hands on Stomach  - 2 x daily - 7 x weekly - 2 sets - 10 reps - Seated Hamstring Stretch  - 2 x daily - 7 x weekly - 2 reps - 20 seconds hold  ASSESSMENT:  CLINICAL IMPRESSION: Pt reported increased back pain with forward walking with yellow hand buoys under water;  improved change in exercise. She reported she could feel good engagement of core. Occasional cues required for more upright trunk.  Pt reported good reduction of LBP while exercising in water; symptoms have centralized.  Progressing towards all goals.   Pt continues to have difficulty performing TrA contractions and requires much cuing for correct form.     OBJECTIVE IMPAIRMENTS Abnormal gait, decreased activity tolerance, decreased balance, decreased endurance, decreased mobility, difficulty walking, decreased ROM, decreased strength, impaired flexibility, postural dysfunction, and pain.   ACTIVITY LIMITATIONS bending, standing, and locomotion level  PARTICIPATION LIMITATIONS: community activity and yard work/gardening  PERSONAL FACTORS Time since onset of injury/illness/exacerbation and 3+ comorbidities: Chronic fatigue syndrome, thoracic degenerative changes, bilat TKA, and multiple lumbar surgeries  are also affecting patient's functional outcome.   REHAB POTENTIAL: Fair due to chronic pain and multiple lumbar surgeries  CLINICAL DECISION MAKING:  Evolving/moderate complexity  EVALUATION COMPLEXITY: Moderate   GOALS:  SHORT TERM GOALS:   Pt will tolerate aquatic therapy without adverse effects for improved pain, function, strength, and tolerance to activity.  Baseline: Goal status: Achieved Target date:  05/21/2022  2.  Pt will report at least a 25% improvement in her posture with ambulation. Baseline:  Goal status: Achieved Target Goal:  06/04/2022  3.  Pt will be independent with land based HEP for improved core strength, pain, and function.  Baseline:  Goal status: Ongoing Target Goal:  06/04/2022  4.  Pt will progress with aquatic exercises for improved core strength in order for improved posture with ambulation and standing activities.  Baseline:  Goal status: Achieved Target goal:  06/04/2022   LONG TERM GOALS: Target date: 07/25/2022  Pt will report at least a 60% improvement in standing straighter with her daily activities, mobility, and ambulation.  Baseline:  Goal status: INITIAL  2.  Pt will report she is able to ambulate her normal community distance without significant pain.  Baseline:  Goal status: INITIAL  3.  Pt will report improved stability with her daily mobility and ambulation.  Baseline:  Goal status: INITIAL  4.  Pt will report at least a 50% improvement in pain with gardening and her normal yard work.  Baseline:  Goal status: Ongoing      PLAN: PT FREQUENCY: 2x/week  PT DURATION: 6 weeks  PLANNED INTERVENTIONS: Therapeutic exercises, Therapeutic activity, Neuromuscular re-education, Balance training, Gait training, Patient/Family education, Stair training, Aquatic Therapy, Dry Needling, Electrical stimulation, Cryotherapy, Moist heat, Taping, Manual therapy, and Re-evaluation.  PLAN FOR NEXT SESSION: Cont with aquatic therapy.   Kerin Perna, PTA 06/13/22 8:25 AM

## 2022-06-14 ENCOUNTER — Encounter (HOSPITAL_BASED_OUTPATIENT_CLINIC_OR_DEPARTMENT_OTHER): Payer: Self-pay | Admitting: Physical Therapy

## 2022-06-14 ENCOUNTER — Ambulatory Visit (HOSPITAL_BASED_OUTPATIENT_CLINIC_OR_DEPARTMENT_OTHER): Payer: Medicare Other | Admitting: Physical Therapy

## 2022-06-14 DIAGNOSIS — M5459 Other low back pain: Secondary | ICD-10-CM

## 2022-06-14 DIAGNOSIS — R262 Difficulty in walking, not elsewhere classified: Secondary | ICD-10-CM

## 2022-06-14 DIAGNOSIS — M6281 Muscle weakness (generalized): Secondary | ICD-10-CM

## 2022-06-15 DIAGNOSIS — Z79891 Long term (current) use of opiate analgesic: Secondary | ICD-10-CM | POA: Diagnosis not present

## 2022-06-15 DIAGNOSIS — G894 Chronic pain syndrome: Secondary | ICD-10-CM | POA: Diagnosis not present

## 2022-06-17 ENCOUNTER — Encounter (HOSPITAL_BASED_OUTPATIENT_CLINIC_OR_DEPARTMENT_OTHER): Payer: Self-pay | Admitting: Physical Therapy

## 2022-06-17 ENCOUNTER — Ambulatory Visit (HOSPITAL_BASED_OUTPATIENT_CLINIC_OR_DEPARTMENT_OTHER): Payer: Medicare Other | Admitting: Physical Therapy

## 2022-06-17 DIAGNOSIS — M5459 Other low back pain: Secondary | ICD-10-CM

## 2022-06-17 DIAGNOSIS — M6281 Muscle weakness (generalized): Secondary | ICD-10-CM

## 2022-06-17 DIAGNOSIS — R262 Difficulty in walking, not elsewhere classified: Secondary | ICD-10-CM

## 2022-06-17 NOTE — Therapy (Signed)
OUTPATIENT PHYSICAL THERAPY THORACOLUMBAR TREATMENT Progress Note Reporting Period 05/07/22 to 06/16/22  See note below for Objective Data and Assessment of Progress/Goals.      Patient Name: [name] [name] MRN: [id number] DOB:12-Mar-1941, 81 y.o., female Today's Date: 06/21/2022   PT End of Session -     Visit Number 10   Number of Visits 22   Date for PT Re-Evaluation 07/29/22   Authorization Type    PT Start Time  1620   PT Stop Time 1700   PT Time Calculation (min) 40   Activity Tolerance Patient tolerated treatment well   Behavior During Therapy  [facility]          Past Medical History:  Diagnosis Date   Acquired left flat foot    Acquired leg length discrepancy    Allergic urticaria    Allergy    Anemia    took iron for a while   Arthritis    lower back, knees   Blood transfusion without reported diagnosis    Bronchitis    history only   Cataract    Chronic fatigue syndrome    Chronic pain    lower back   Contusion of left hand    Female bladder prolapse    Fracture of metatarsal bone    w nonunion    Gout    Hallux rigidus    History of measles    History of mumps    History of rubella    Hyperlipidemia    Hypertension    Hypothyroidism    Imbalance    Macular degeneration    wet- right eye  dry- left eye   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[place])    Metatarsalgia of left foot    Missed ab    x 6 - 2 surgery, 4 resolved on it's own   Neuromuscular disorder ([place])    , left foot has some numbeness- uses cane   Numbness    outer edge of heel left foot    Osteopenia    Pneumonia    PONV (postoperative nausea and vomiting)    states she had scop patch in past,states she sets off alarms when she is waking up, she "forgets to breathe"    Seasonal allergies    Shingles    Tinnitus    Trigger middle finger of left hand    Ulcer    hx   Urinary incontinence    Past Surgical History:  Procedure Laterality Date   ABDOMINAL HYSTERECTOMY Bilateral  10/07/2014   Procedure: TOTAL ABDOMINAL HYSTERECTOMY WITH BILATERAL SALPINGO OOPHORECTOMY AND HALBANS CULDOELASTY ;  Surgeon: Lyman Speller, MD;  Location: [facility];  Service: Gynecology;  Laterality: Bilateral;  Miller primary/Silva assist  4 hours total   ANTERIOR AND POSTERIOR REPAIR N/A 10/07/2014   Procedure: ANTERIOR (CYSTOCELE) AND POSTERIOR REPAIR (RECTOCELE);  Surgeon: Lyman Speller, MD;  Location: [facility];  Service: Gynecology;  Laterality: N/A;   APPENDECTOMY     BACK SURGERY  09,00,10,99, 6/14,9/14   x6 surgeries on back -Spinal Fusin L4-L5, Lumbar Fusion L1-S1   BLADDER SUSPENSION N/A 10/07/2014   Procedure: TRANSVAGINAL TAPE (TVT) EXACT PROCEDURE;  Surgeon: Lyman Speller, MD;  Location: [facility];  Service: Gynecology;  Laterality: N/A;   BREAST EXCISIONAL BIOPSY Left 1998   benign   BREAST SURGERY     left cyst- benign   BUNIONECTOMY  03/02/11 and 11/04/11   left foot x2   CARPAL TUNNEL RELEASE  both   CATARACT EXTRACTION Left 08/03/2019   CATARACT EXTRACTION Right 08/03/2019   CYSTOSCOPY N/A 10/07/2014   Procedure: CYSTOSCOPY;  Surgeon: Lyman Speller, MD;  Location: [facility];  Service: Gynecology;  Laterality: N/A;   DILATION AND CURETTAGE OF UTERUS     x2   Eye injections Left    HAMMER TOE SURGERY  4/12   lt foot-2-3   I&D and spina abscess L4-5     JOINT REPLACEMENT     R- TKA- 2013   KNEE ARTHROSCOPY  2007   both   LUMBAR LAMINECTOMY  05/1999   L4-L5   METATARSAL OSTEOTOMY  3/12   lt   ORIF TOE FRACTURE  11/04/2011   Procedure: OPEN REDUCTION INTERNAL FIXATION (ORIF) METATARSAL (TOE) FRACTURE;  Surgeon: Wylene Simmer, MD;  Location: [facility];  Service: Orthopedics;  Laterality: Left;  left revision orif 1st metatarsal with autograft from left calcaneous and left 2nd-3rd metatarsal weil osteotomy   osteoma  12   forehead x2   OTHER SURGICAL HISTORY  05/08/13   hardware removed from left foot   removal of two osteomas     on  forehead-2008   STERIOD INJECTION Left 11/04/2015   Procedure: STEROID INJECTION;  Surgeon: Gaynelle Arabian, MD;  Location: [facility];  Service: Orthopedics;  Laterality: Left;   THUMB FUSION  2008   rt   TOTAL KNEE ARTHROPLASTY  08/29/2012   Procedure: TOTAL KNEE ARTHROPLASTY;  Surgeon: Hessie Dibble, MD;  Location: [facility];  Service: Orthopedics;  Laterality: Right;   TOTAL KNEE ARTHROPLASTY Left 05/17/2016   Procedure: LEFT TOTAL KNEE ARTHROPLASTY;  Surgeon: Gaynelle Arabian, MD;  Location: [facility];  Service: Orthopedics;  Laterality: Left;   TOTAL KNEE ARTHROPLASTY Right    TOTAL KNEE REVISION Right 11/04/2015   Procedure: TOTAL KNEE ARTHROPLASTY REVISION;  Surgeon: Gaynelle Arabian, MD;  Location: [facility];  Service: Orthopedics;  Laterality: Right;   TOTAL SHOULDER ARTHROPLASTY Right 05/09/2014   Procedure: TOTAL SHOULDER ARTHROPLASTY;  Surgeon: Nita Sells, MD;  Location: [place];  Service: Orthopedics;  Laterality: Right;  Right shoulder arthroplasty   TUBAL LIGATION     Patient Active Problem List   Diagnosis Date Noted   Chronic low back pain 03/04/2022   Chronic kidney disease, stage 3b ([place]) 02/03/2022   History of total knee replacement, right 03/05/2018   Hypokalemia 05/19/2016   Sinus tachycardia 05/19/2016   OA (osteoarthritis) of knee 05/17/2016   Failed total right knee replacement ([place]) 11/04/2015   Failed total knee, right ([place]) 11/04/2015   S/P TAH (total abdominal hysterectomy) 10/08/2014   Pelvic prolapse 10/07/2014   Cystocele 08/16/2014   Glenohumeral arthritis 05/09/2014   Right knee DJD 08/29/2012    Class: Chronic    PCP: Tonna Boehringer, MD  REFERRING PROVIDER: Lyndal Pulley, DO   REFERRING DIAG: M54.42,M54.41,G89.29 (ICD-10-CM) - Chronic bilateral low back pain with bilateral sciatica   Rationale for Evaluation and Treatment Rehabilitation  THERAPY DIAG:  Other low back pain - Plan: PT plan of care cert/re-cert  Muscle weakness (generalized) -  Plan: PT plan of care cert/re-cert  Difficulty in walking, not elsewhere classified - Plan: PT plan of care cert/re-cert  ONSET DATE: Chronic pain ; MD visit/order 05/04/2022   SUBJECTIVE:  SUBJECTIVE STATEMENT: Pt with increased pain after taking a walk with her husband. "I feel stronger but pain still there, a little less'  PAIN:  Are you having pain? Yes NPRS:  Current: 3/10 Location:  central lower lumbar ; (no radicular symptoms today) Type: constant; aching; can be sharp at times  PERTINENT HISTORY:  -LATEX ALLERGY -Chronic back pain with x ray findings of significant DDD at T12-L1 and mild degenerative changes at T10-T11 and T11-T12 -Chronic fatigue syndrome, osteopenia, Cervical spondylosis, and Macular degeneratio -PSHx:  6 lumbar surgeries with L1-S1 fusion in 2014, bilat TKA, R TSA    PRECAUTIONS: Other: L1-S1 fusion in 2014, degenerative thoracic changes, osteopenia, bilat TKA, R TSA.  Pt has a LATEX ALLERGY.  WEIGHT BEARING RESTRICTIONS No  FALLS:  Has patient fallen in last 6 months? Yes. Number of falls 1; tripped over a large rock in her yard  LIVING ENVIRONMENT: Lives with: lives with their spouse Lives in: 1 level home Stairs: 4 steps to enter home with 1 rail Has following equipment at home: Single point cane and 3 wheeled walker  OCCUPATION: Pt is retired  PLOF: Independent; Pt has a Hx of lumbar pain. She has been using cane for 5-6 years.  PATIENT GOALS improve strength and balance.  Wants to be more steady on feet. To be able to stand straighter with ambulation   OBJECTIVE:   DIAGNOSTIC FINDINGS:  Xrays  IMPRESSION: 1. Postsurgical changes of prior PLIF with interbody grafts at each level and an interbody cage at L4-L5. No evidence of hardware complication. 2. Interval development of significant  adjacent level degenerative disc disease at T12-L1. 3. More mild adjacent level degenerative changes also visualized at T10-T11 and T11-T12.  IMPRESSION: Exaggerated thoracic kyphosis with adjacent level degenerative disc disease at T12-T11.  Changes of degenerative disc disease noted throughout the majority of the thoracic spine. No evidence of acute fracture.      LUMBAR ROM:    Active  AROM  eval AROM  Flexion WFL   Extension     Right lateral flexion 75% with pain 85 with pain  Left lateral flexion WFL   Right rotation     Left rotation      (Blank rows = not tested)     LOWER EXTREMITY MMT:     MMT Right eval Left eval Right/Left  Hip flexion 4/5 4+/5 5-/5  Hip extension       Hip abduction WFL tested in sitting WFL tested in sitting   Hip adduction       Hip internal rotation       Hip external rotation       Knee flexion 5/5 tested in sitting 5/5 tested in sitting   Knee extension 5/5 5/5   Ankle dorsiflexion 5/5 5/5   Ankle plantarflexion WFL tested in sitting WFL tested in sitting   Ankle inversion       Ankle eversion        (Blank rows = not tested)   LUMBAR SPECIAL TESTS:  Supine SLR test: positive bilat   FUNCTIONAL TESTS:  5x STS:  16 sec without UE's.      06/17/22   5x STS: 13s   TUG: 15s   Berg:38/56   GAIT: Distance walked: approx 120 ft Assistive device utilized: Single point cane Level of assistance: Complete Independence Comments: Pt ambulates with increased Wb'ing thru cane and has forward flexed posture the more she walks.  Pt had antalgic limp, increased fwd flexion, and  lean to the L when ambulating without cane.      TODAY'S TREATMENT  Pt seen for aquatic therapy today.  Treatment took place in water 3.25-4.5 ft in depth at the [place]. Temp of water was 91.  Pt entered/exited the pool via steps with 2 rails, indep.   Walking forward, Back, and side stepping   [place] x 2 laps, cues for relaxed knees  when crossing  blue squoodle pull down x15 Kickboard push/ pull at 10, 12, and 2 o'clock in staggered stance x 20 Vertical suspension yellow noodle under arms posteriorly: cycling; scissoring and skiiing; bilat clams with feet perched on floor Holding yellow noodle: stork stance with hip IR/ER Yellow hand buoys at side with forward/ backward walking   Pt requires the buoyancy and hydrostatic pressure of water for support, and to offload joints by unweighting joint load by at least 50 % in naval deep water and by at least 75-80% in chest to neck deep water.  Viscosity of the water is needed for resistance of strengthening. Water current perturbations provides challenge to standing balance requiring increased core activation.  Objective testing completed  PATIENT EDUCATION:  Education details: Educated pt in exercise form, rationale of exercises.   Person educated: Patient Education method: Explanation Education comprehension: verbalized understanding   HOME EXERCISE PROGRAM: Access Code: [email or web address] URL: [email or web address] Date: 05/26/2022 Prepared by: Ronny Flurry  Exercises - Seated Scapular Retraction  - 1-2 x daily - 5-6 x weekly - 2 sets - 10 reps - 3-5 seconds hold - Seated Shoulder Row with Anchored Resistance  - 1 x daily - 3-4 x weekly - 2-3 sets - 10 reps - Hooklying Clamshell with Resistance  - 1 x daily - 4 x weekly - 2 sets - 10 reps - Supine Transversus Abdominis Bracing - Hands on Stomach  - 2 x daily - 7 x weekly - 2 sets - 10 reps - Seated Hamstring Stretch  - 2 x daily - 7 x weekly - 2 reps - 20 seconds hold  ASSESSMENT:  CLINICAL IMPRESSION: Pt with pain response after walking with husband last night.  She does states that she is moving/walking with increased stability and safety in home environment.  She continues with her gardening pushing through pain sx as she enjoys it but does remark she tolerates longer episodes of outdoor engagement with  overall less adverse affects afterward. She has not yet met all of her goals but is progressing well towards them. As noted above strength and ROM improved. She continues with a significant trendelenburg and antalgic gait pattern due to advancing left hip dysfunction coupled with LB degenerative process.  She will continue to benefit from skilled Physical Therapy to progress towards meeting goals and reaching her maximal potential decreasing fall risk and improving quality of life.     OBJECTIVE IMPAIRMENTS Abnormal gait, decreased activity tolerance, decreased balance, decreased endurance, decreased mobility, difficulty walking, decreased ROM, decreased strength, impaired flexibility, postural dysfunction, and pain.   ACTIVITY LIMITATIONS bending, standing, and locomotion level  PARTICIPATION LIMITATIONS: community activity and yard work/gardening  PERSONAL FACTORS Time since onset of injury/illness/exacerbation and 3+ comorbidities: Chronic fatigue syndrome, thoracic degenerative changes, bilat TKA, and multiple lumbar surgeries  are also affecting patient's functional outcome.   REHAB POTENTIAL: Fair due to chronic pain and multiple lumbar surgeries  CLINICAL DECISION MAKING: Evolving/moderate complexity  EVALUATION COMPLEXITY: Moderate   GOALS:  SHORT TERM GOALS:   Pt will tolerate aquatic therapy without adverse effects for  improved pain, function, strength, and tolerance to activity.  Baseline: Goal status: Achieved Target date:  05/21/2022  2.  Pt will report at least a 25% improvement in her posture with ambulation. Baseline:  Goal status: Achieved Target Goal:  06/04/2022  3.  Pt will be independent with land based HEP for improved core strength, pain, and function.  Baseline:  Goal status: Achieved Target Goal:  06/04/2022  4.  Pt will progress with aquatic exercises for improved core strength in order for improved posture with ambulation and standing activities.   Baseline:  Goal status: Achieved Target goal:  06/04/2022   LONG TERM GOALS: Target date: 08/02/2022  Pt will report at least a 60% improvement in standing straighter with her daily activities, mobility, and ambulation.  Baseline: 7-13:Pt reports 40% Goal status: INITIAL  2.  Pt will report she is able to ambulate her normal community distance without significant pain.  Baseline:  Goal status: INITIAL  3.  Pt will report improved stability with her daily mobility and ambulation.  Baseline: stability is better but not PLOF Goal status: Achieved  4.  Pt will report at least a 50% improvement in pain with gardening and her normal yard work.  Baseline: 7-13:Pt reports 30% Goal status: Ongoing  5. Pt will improve on Berg balance test to 50/56 or > to demonstrate improved balance decreasing fall risk. Baseline: 38/56 Goal: new     PLAN: PT FREQUENCY: 1-2xw  PT DURATION: 6 weeks  PLANNED INTERVENTIONS: Therapeutic exercises, Therapeutic activity, Neuromuscular re-education, Balance training, Gait training, Patient/Family education, Stair training, Aquatic Therapy, Dry Needling, Electrical stimulation, Cryotherapy, Moist heat, Taping, Manual therapy, and Re-evaluation.  PLAN FOR NEXT SESSION: Cont with aquatic therapy.  PN next visit.    Stanton Kidney [facility]) Angelin Cutrone MPT 06/21/22 1:50 PM  Addended 06/21/22 153pm Donat Humble [facility]) [place] MPT

## 2022-06-21 ENCOUNTER — Encounter (HOSPITAL_BASED_OUTPATIENT_CLINIC_OR_DEPARTMENT_OTHER): Payer: Self-pay | Admitting: Physical Therapy

## 2022-06-21 ENCOUNTER — Ambulatory Visit (HOSPITAL_BASED_OUTPATIENT_CLINIC_OR_DEPARTMENT_OTHER): Payer: Medicare Other | Admitting: Physical Therapy

## 2022-06-21 DIAGNOSIS — R262 Difficulty in walking, not elsewhere classified: Secondary | ICD-10-CM

## 2022-06-21 DIAGNOSIS — M5459 Other low back pain: Secondary | ICD-10-CM

## 2022-06-21 DIAGNOSIS — M6281 Muscle weakness (generalized): Secondary | ICD-10-CM

## 2022-06-21 NOTE — Therapy (Signed)
OUTPATIENT PHYSICAL THERAPY THORACOLUMBAR TREATMENT   Patient Name: [name] [name] MRN: [id number] DOB:June 20, 1941, 81 y.o., female Today's Date: 06/21/2022   PT End of Session - 06/21/22 1135     Visit Number 11    Number of Visits 22    Date for PT Re-Evaluation 07/29/22    Authorization Type Medicare A and B    PT Start Time 1118    PT Stop Time 1200    PT Time Calculation (min) 42 min    Activity Tolerance Patient tolerated treatment well    Behavior During Therapy WFL for tasks assessed/performed                 Past Medical History:  Diagnosis Date   Acquired left flat foot    Acquired leg length discrepancy    Allergic urticaria    Allergy    Anemia    took iron for a while   Arthritis    lower back, knees   Blood transfusion without reported diagnosis    Bronchitis    history only   Cataract    Chronic fatigue syndrome    Chronic pain    lower back   Contusion of left hand    Female bladder prolapse    Fracture of metatarsal bone    w nonunion    Gout    Hallux rigidus    History of measles    History of mumps    History of rubella    Hyperlipidemia    Hypertension    Hypothyroidism    Imbalance    Macular degeneration    wet- right eye  dry- left eye   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[place])    Metatarsalgia of left foot    Missed ab    x 6 - 2 surgery, 4 resolved on it's own   Neuromuscular disorder ([place])    , left foot has some numbeness- uses cane   Numbness    outer edge of heel left foot    Osteopenia    Pneumonia    PONV (postoperative nausea and vomiting)    states she had scop patch in past,states she sets off alarms when she is waking up, she "forgets to breathe"    Seasonal allergies    Shingles    Tinnitus    Trigger middle finger of left hand    Ulcer    hx   Urinary incontinence    Past Surgical History:  Procedure Laterality Date   ABDOMINAL HYSTERECTOMY Bilateral 10/07/2014   Procedure: TOTAL ABDOMINAL  HYSTERECTOMY WITH BILATERAL SALPINGO OOPHORECTOMY AND HALBANS CULDOELASTY ;  Surgeon: Lyman Speller, MD;  Location: [facility];  Service: Gynecology;  Laterality: Bilateral;  Miller primary/Silva assist  4 hours total   ANTERIOR AND POSTERIOR REPAIR N/A 10/07/2014   Procedure: ANTERIOR (CYSTOCELE) AND POSTERIOR REPAIR (RECTOCELE);  Surgeon: Lyman Speller, MD;  Location: [facility];  Service: Gynecology;  Laterality: N/A;   APPENDECTOMY     BACK SURGERY  09,00,10,99, 6/14,9/14   x6 surgeries on back -Spinal Fusin L4-L5, Lumbar Fusion L1-S1   BLADDER SUSPENSION N/A 10/07/2014   Procedure: TRANSVAGINAL TAPE (TVT) EXACT PROCEDURE;  Surgeon: Lyman Speller, MD;  Location: [facility];  Service: Gynecology;  Laterality: N/A;   BREAST EXCISIONAL BIOPSY Left 1998   benign   BREAST SURGERY     left cyst- benign   BUNIONECTOMY  03/02/11 and 11/04/11   left foot x2   CARPAL TUNNEL RELEASE  both   CATARACT EXTRACTION Left 08/03/2019   CATARACT EXTRACTION Right 08/03/2019   CYSTOSCOPY N/A 10/07/2014   Procedure: CYSTOSCOPY;  Surgeon: Lyman Speller, MD;  Location: [facility];  Service: Gynecology;  Laterality: N/A;   DILATION AND CURETTAGE OF UTERUS     x2   Eye injections Left    HAMMER TOE SURGERY  4/12   lt foot-2-3   I&D and spina abscess L4-5     JOINT REPLACEMENT     R- TKA- 2013   KNEE ARTHROSCOPY  2007   both   LUMBAR LAMINECTOMY  05/1999   L4-L5   METATARSAL OSTEOTOMY  3/12   lt   ORIF TOE FRACTURE  11/04/2011   Procedure: OPEN REDUCTION INTERNAL FIXATION (ORIF) METATARSAL (TOE) FRACTURE;  Surgeon: Wylene Simmer, MD;  Location: [facility];  Service: Orthopedics;  Laterality: Left;  left revision orif 1st metatarsal with autograft from left calcaneous and left 2nd-3rd metatarsal weil osteotomy   osteoma  12   forehead x2   OTHER SURGICAL HISTORY  05/08/13   hardware removed from left foot   removal of two osteomas     on forehead-2008   STERIOD INJECTION Left  11/04/2015   Procedure: STEROID INJECTION;  Surgeon: Gaynelle Arabian, MD;  Location: [facility];  Service: Orthopedics;  Laterality: Left;   THUMB FUSION  2008   rt   TOTAL KNEE ARTHROPLASTY  08/29/2012   Procedure: TOTAL KNEE ARTHROPLASTY;  Surgeon: Hessie Dibble, MD;  Location: [facility];  Service: Orthopedics;  Laterality: Right;   TOTAL KNEE ARTHROPLASTY Left 05/17/2016   Procedure: LEFT TOTAL KNEE ARTHROPLASTY;  Surgeon: Gaynelle Arabian, MD;  Location: [facility];  Service: Orthopedics;  Laterality: Left;   TOTAL KNEE ARTHROPLASTY Right    TOTAL KNEE REVISION Right 11/04/2015   Procedure: TOTAL KNEE ARTHROPLASTY REVISION;  Surgeon: Gaynelle Arabian, MD;  Location: [facility];  Service: Orthopedics;  Laterality: Right;   TOTAL SHOULDER ARTHROPLASTY Right 05/09/2014   Procedure: TOTAL SHOULDER ARTHROPLASTY;  Surgeon: Nita Sells, MD;  Location: [facility];  Service: Orthopedics;  Laterality: Right;  Right shoulder arthroplasty   TUBAL LIGATION     Patient Active Problem List   Diagnosis Date Noted   Chronic low back pain 03/04/2022   Chronic kidney disease, stage 3b ([place]) 02/03/2022   History of total knee replacement, right 03/05/2018   Hypokalemia 05/19/2016   Sinus tachycardia 05/19/2016   OA (osteoarthritis) of knee 05/17/2016   Failed total right knee replacement ([place]) 11/04/2015   Failed total knee, right ([place]) 11/04/2015   S/P TAH (total abdominal hysterectomy) 10/08/2014   Pelvic prolapse 10/07/2014   Cystocele 08/16/2014   Glenohumeral arthritis 05/09/2014   Right knee DJD 08/29/2012    Class: Chronic    PCP: Tonna Boehringer, MD  REFERRING PROVIDER: Lyndal Pulley, DO   REFERRING DIAG: M54.42,M54.41,G89.29 (ICD-10-CM) - Chronic bilateral low back pain with bilateral sciatica   Rationale for Evaluation and Treatment Rehabilitation  THERAPY DIAG:  Other low back pain  Muscle weakness (generalized)  Difficulty in walking, not elsewhere classified  ONSET DATE: Chronic pain  ; MD visit/order 05/04/2022   SUBJECTIVE:  SUBJECTIVE STATEMENT: Pt with increased pain after taking a walk with her husband. "I feel stronger but pain still there, a little less'  PAIN:  Are you having pain? Yes NPRS:  Current: 3/10 Location:  central lower lumbar ; (no radicular symptoms today) Type: constant; aching; can be sharp at times  PERTINENT HISTORY:  -LATEX ALLERGY -Chronic back pain with x ray findings of significant DDD at T12-L1 and mild degenerative changes at T10-T11 and T11-T12 -Chronic fatigue syndrome, osteopenia, Cervical spondylosis, and Macular degeneratio -PSHx:  6 lumbar surgeries with L1-S1 fusion in 2014, bilat TKA, R TSA    PRECAUTIONS: Other: L1-S1 fusion in 2014, degenerative thoracic changes, osteopenia, bilat TKA, R TSA.  Pt has a LATEX ALLERGY.  WEIGHT BEARING RESTRICTIONS No  FALLS:  Has patient fallen in last 6 months? Yes. Number of falls 1; tripped over a large rock in her yard  LIVING ENVIRONMENT: Lives with: lives with their spouse Lives in: 1 level home Stairs: 4 steps to enter home with 1 rail Has following equipment at home: Single point cane and 3 wheeled walker  OCCUPATION: Pt is retired  PLOF: Independent; Pt has a Hx of lumbar pain. She has been using cane for 5-6 years.  PATIENT GOALS improve strength and balance.  Wants to be more steady on feet. To be able to stand straighter with ambulation   OBJECTIVE:   DIAGNOSTIC FINDINGS:  Xrays  IMPRESSION: 1. Postsurgical changes of prior PLIF with interbody grafts at each level and an interbody cage at L4-L5. No evidence of hardware complication. 2. Interval development of significant adjacent level degenerative disc disease at T12-L1. 3. More mild adjacent level degenerative changes also visualized at T10-T11 and  T11-T12.  IMPRESSION: Exaggerated thoracic kyphosis with adjacent level degenerative disc disease at T12-T11.  Changes of degenerative disc disease noted throughout the majority of the thoracic spine. No evidence of acute fracture.      LUMBAR ROM:    Active  AROM  eval AROM  Flexion WFL   Extension     Right lateral flexion 75% with pain 85 with pain  Left lateral flexion WFL   Right rotation     Left rotation      (Blank rows = not tested)     LOWER EXTREMITY MMT:     MMT Right eval Left eval Right/Left  Hip flexion 4/5 4+/5 5-/5  Hip extension       Hip abduction WFL tested in sitting WFL tested in sitting   Hip adduction       Hip internal rotation       Hip external rotation       Knee flexion 5/5 tested in sitting 5/5 tested in sitting   Knee extension 5/5 5/5   Ankle dorsiflexion 5/5 5/5   Ankle plantarflexion WFL tested in sitting WFL tested in sitting   Ankle inversion       Ankle eversion        (Blank rows = not tested)   LUMBAR SPECIAL TESTS:  Supine SLR test: positive bilat   FUNCTIONAL TESTS:  5x STS:  16 sec without UE's.      06/17/22   5x STS: 13s   TUG: 15s   Berg:38/56  BERG Balance Test          Date: 06/17/22  Sit to Stand 4  Standing unsupported 4  Sitting with back unsupported but feet supported 4  Stand to sit  3  Transfers  3  Standing  unsupported with eyes closed 3  Standing unsupported feet together 3  From standing position, reach forward with outstretched arm 3  From standing position, pick up object from floor 3  From standing position, turn and look behind over each shoulder 2  Turn 360 2  Standing unsupported, alternately place foot on step 3  Standing unsupported, one foot in front 1  Standing on one leg 0  Total:  38/56      GAIT: Distance walked: approx 120 ft Assistive device utilized: Single point cane Level of assistance: Complete Independence Comments: Pt ambulates with increased Wb'ing thru cane and  has forward flexed posture the more she walks.  Pt had antalgic limp, increased fwd flexion, and lean to the L when ambulating without cane.      TODAY'S TREATMENT  Pt seen for aquatic therapy today.  Treatment took place in water 3.25-4.5 ft in depth at the [facility]. Temp of water was 91.  Pt entered/exited the pool via steps with 2 rails, indep.   Walking forward, Back, and side stepping   [place] x 2 laps, cues for relaxed knees when crossing Side stepping with arms abdct/add with yellow hand buoys Forward/backward walking with yellow buoys at side Squats pushing yellow buoy under water, controlling ascending motion; added with alternating hip abdct SLS without support x 10-20s x 2 reps each, skulling UE to steady Tandem stance 30s each leg forward  Alternating toe taps to 1st step without UE support Trunk rotation looking over shoulder x 3 each  Turning 360 x 2 reps each direction Tandem gait forward/ backward   blue squoodle pull down x15 Vertical suspension yellow noodle between legs: cycling; scissoring   Pt requires the buoyancy and hydrostatic pressure of water for support, and to offload joints by unweighting joint load by at least 50 % in naval deep water and by at least 75-80% in chest to neck deep water.  Viscosity of the water is needed for resistance of strengthening. Water current perturbations provides challenge to standing balance requiring increased core activation.  Objective testing completed  PATIENT EDUCATION:  Education details: Educated pt in exercise form, rationale of exercises.   Person educated: Patient Education method: Explanation Education comprehension: verbalized understanding   HOME EXERCISE PROGRAM: Access Code: [email or web address] URL: [email or web address] Date: 05/26/2022 Prepared by: Ronny Flurry  Exercises - Seated Scapular Retraction  - 1-2 x daily - 5-6 x weekly - 2 sets - 10 reps - 3-5 seconds hold - Seated  Shoulder Row with Anchored Resistance  - 1 x daily - 3-4 x weekly - 2-3 sets - 10 reps - Hooklying Clamshell with Resistance  - 1 x daily - 4 x weekly - 2 sets - 10 reps - Supine Transversus Abdominis Bracing - Hands on Stomach  - 2 x daily - 7 x weekly - 2 sets - 10 reps - Seated Hamstring Stretch  - 2 x daily - 7 x weekly - 2 reps - 20 seconds hold  ASSESSMENT:  CLINICAL IMPRESSION: Pt requires minor cues for upright posture and neutral head position (vs looking down). Trialed some of the balance items from [place].  SLS without support continues to be challenging. She reported reduction of tightness in bilat knee and reduction of back pain to 4/10 at end of session. She was able to sit easily on yellow noodle with good balance.  She will continue to benefit from skilled Physical Therapy to progress towards meeting goals and reaching her maximal potential decreasing fall  risk and improving quality of life.  OBJECTIVE IMPAIRMENTS Abnormal gait, decreased activity tolerance, decreased balance, decreased endurance, decreased mobility, difficulty walking, decreased ROM, decreased strength, impaired flexibility, postural dysfunction, and pain.   ACTIVITY LIMITATIONS bending, standing, and locomotion level  PARTICIPATION LIMITATIONS: community activity and yard work/gardening  PERSONAL FACTORS Time since onset of injury/illness/exacerbation and 3+ comorbidities: Chronic fatigue syndrome, thoracic degenerative changes, bilat TKA, and multiple lumbar surgeries  are also affecting patient's functional outcome.   REHAB POTENTIAL: Fair due to chronic pain and multiple lumbar surgeries  CLINICAL DECISION MAKING: Evolving/moderate complexity  EVALUATION COMPLEXITY: Moderate   GOALS:  SHORT TERM GOALS:   Pt will tolerate aquatic therapy without adverse effects for improved pain, function, strength, and tolerance to activity.  Baseline: Goal status: Achieved Target date:  05/21/2022  2.  Pt will  report at least a 25% improvement in her posture with ambulation. Baseline:  Goal status: Achieved Target Goal:  06/04/2022  3.  Pt will be independent with land based HEP for improved core strength, pain, and function.  Baseline:  Goal status: Achieved Target Goal:  06/04/2022  4.  Pt will progress with aquatic exercises for improved core strength in order for improved posture with ambulation and standing activities.  Baseline:  Goal status: Achieved Target goal:  06/04/2022   LONG TERM GOALS: Target date: 08/02/2022  Pt will report at least a 60% improvement in standing straighter with her daily activities, mobility, and ambulation.  Baseline: 7-13:Pt reports 40% Goal status: INITIAL  2.  Pt will report she is able to ambulate her normal community distance without significant pain.  Baseline:  Goal status: INITIAL  3.  Pt will report improved stability with her daily mobility and ambulation.  Baseline: stability is better but not PLOF Goal status: Achieved  4.  Pt will report at least a 50% improvement in pain with gardening and her normal yard work.  Baseline: 7-13:Pt reports 30% Goal status: Ongoing  5. Pt will improve on Berg balance test to 50/56 or > to demonstrate improved balance decreasing fall risk. Baseline: 38/56 Goal: new     PLAN: PT FREQUENCY: 1-2xw  PT DURATION: 6 weeks  PLANNED INTERVENTIONS: Therapeutic exercises, Therapeutic activity, Neuromuscular re-education, Balance training, Gait training, Patient/Family education, Stair training, Aquatic Therapy, Dry Needling, Electrical stimulation, Cryotherapy, Moist heat, Taping, Manual therapy, and Re-evaluation.  PLAN FOR NEXT SESSION: Cont with aquatic therapy.    Kerin Perna, PTA 06/21/22 11:59 AM

## 2022-06-25 ENCOUNTER — Encounter (HOSPITAL_BASED_OUTPATIENT_CLINIC_OR_DEPARTMENT_OTHER): Payer: Self-pay | Admitting: Physical Therapy

## 2022-06-25 ENCOUNTER — Ambulatory Visit (HOSPITAL_BASED_OUTPATIENT_CLINIC_OR_DEPARTMENT_OTHER): Payer: Medicare Other | Admitting: Physical Therapy

## 2022-06-25 DIAGNOSIS — R262 Difficulty in walking, not elsewhere classified: Secondary | ICD-10-CM

## 2022-06-25 DIAGNOSIS — M5459 Other low back pain: Secondary | ICD-10-CM

## 2022-06-25 DIAGNOSIS — M6281 Muscle weakness (generalized): Secondary | ICD-10-CM

## 2022-06-25 NOTE — Therapy (Signed)
OUTPATIENT PHYSICAL THERAPY THORACOLUMBAR TREATMENT   Patient Name: [name] [name] MRN: [id number] DOB:[birth year], 81 y.o., female Today's Date: 06/25/2022   PT End of Session - 06/25/22 1129     Visit Number 12    Number of Visits 22    Date for PT Re-Evaluation 07/29/22    Authorization Type Medicare A and B    PT Start Time 1120    PT Stop Time 1200    PT Time Calculation (min) 40 min    Activity Tolerance Patient tolerated treatment well    Behavior During Therapy WFL for tasks assessed/performed                 Past Medical History:  Diagnosis Date   Acquired left flat foot    Acquired leg length discrepancy    Allergic urticaria    Allergy    Anemia    took iron for a while   Arthritis    lower back, knees   Blood transfusion without reported diagnosis    Bronchitis    history only   Cataract    Chronic fatigue syndrome    Chronic pain    lower back   Contusion of left hand    Female bladder prolapse    Fracture of metatarsal bone    w nonunion    Gout    Hallux rigidus    History of measles    History of mumps    History of rubella    Hyperlipidemia    Hypertension    Hypothyroidism    Imbalance    Macular degeneration    wet- right eye  dry- left eye   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[place])    Metatarsalgia of left foot    Missed ab    x 6 - 2 surgery, 4 resolved on it's own   Neuromuscular disorder ([place])    , left foot has some numbeness- uses cane   Numbness    outer edge of heel left foot    Osteopenia    Pneumonia    PONV (postoperative nausea and vomiting)    states she had scop patch in past,states she sets off alarms when she is waking up, she "forgets to breathe"    Seasonal allergies    Shingles    Tinnitus    Trigger middle finger of left hand    Ulcer    hx   Urinary incontinence    Past Surgical History:  Procedure Laterality Date   ABDOMINAL HYSTERECTOMY Bilateral 10/07/2014   Procedure: TOTAL ABDOMINAL  HYSTERECTOMY WITH BILATERAL SALPINGO OOPHORECTOMY AND HALBANS CULDOELASTY ;  Surgeon: Lyman Speller, MD;  Location: [facility];  Service: Gynecology;  Laterality: Bilateral;  Miller primary/Silva assist  4 hours total   ANTERIOR AND POSTERIOR REPAIR N/A 10/07/2014   Procedure: ANTERIOR (CYSTOCELE) AND POSTERIOR REPAIR (RECTOCELE);  Surgeon: Lyman Speller, MD;  Location: [facility];  Service: Gynecology;  Laterality: N/A;   APPENDECTOMY     BACK SURGERY  09,00,10,99, 6/14,9/14   x6 surgeries on back -Spinal Fusin L4-L5, Lumbar Fusion L1-S1   BLADDER SUSPENSION N/A 10/07/2014   Procedure: TRANSVAGINAL TAPE (TVT) EXACT PROCEDURE;  Surgeon: Lyman Speller, MD;  Location: [facility];  Service: Gynecology;  Laterality: N/A;   BREAST EXCISIONAL BIOPSY Left 1998   benign   BREAST SURGERY     left cyst- benign   BUNIONECTOMY  03/02/11 and 11/04/11   left foot x2   CARPAL TUNNEL RELEASE  both   CATARACT EXTRACTION Left 08/03/2019   CATARACT EXTRACTION Right 08/03/2019   CYSTOSCOPY N/A 10/07/2014   Procedure: CYSTOSCOPY;  Surgeon: Lyman Speller, MD;  Location: [facility];  Service: Gynecology;  Laterality: N/A;   DILATION AND CURETTAGE OF UTERUS     x2   Eye injections Left    HAMMER TOE SURGERY  4/12   lt foot-2-3   I&D and spina abscess L4-5     JOINT REPLACEMENT     R- TKA- 2013   KNEE ARTHROSCOPY  2007   both   LUMBAR LAMINECTOMY  05/1999   L4-L5   METATARSAL OSTEOTOMY  3/12   lt   ORIF TOE FRACTURE  11/04/2011   Procedure: OPEN REDUCTION INTERNAL FIXATION (ORIF) METATARSAL (TOE) FRACTURE;  Surgeon: Wylene Simmer, MD;  Location: [facility];  Service: Orthopedics;  Laterality: Left;  left revision orif 1st metatarsal with autograft from left calcaneous and left 2nd-3rd metatarsal weil osteotomy   osteoma  12   forehead x2   OTHER SURGICAL HISTORY  05/08/13   hardware removed from left foot   removal of two osteomas     on forehead-2008   STERIOD INJECTION Left  11/04/2015   Procedure: STEROID INJECTION;  Surgeon: Gaynelle Arabian, MD;  Location: [facility];  Service: Orthopedics;  Laterality: Left;   THUMB FUSION  2008   rt   TOTAL KNEE ARTHROPLASTY  08/29/2012   Procedure: TOTAL KNEE ARTHROPLASTY;  Surgeon: Hessie Dibble, MD;  Location: [facility];  Service: Orthopedics;  Laterality: Right;   TOTAL KNEE ARTHROPLASTY Left 05/17/2016   Procedure: LEFT TOTAL KNEE ARTHROPLASTY;  Surgeon: Gaynelle Arabian, MD;  Location: [facility];  Service: Orthopedics;  Laterality: Left;   TOTAL KNEE ARTHROPLASTY Right    TOTAL KNEE REVISION Right 11/04/2015   Procedure: TOTAL KNEE ARTHROPLASTY REVISION;  Surgeon: Gaynelle Arabian, MD;  Location: [facility];  Service: Orthopedics;  Laterality: Right;   TOTAL SHOULDER ARTHROPLASTY Right 05/09/2014   Procedure: TOTAL SHOULDER ARTHROPLASTY;  Surgeon: Nita Sells, MD;  Location: [facility];  Service: Orthopedics;  Laterality: Right;  Right shoulder arthroplasty   TUBAL LIGATION     Patient Active Problem List   Diagnosis Date Noted   Chronic low back pain 03/04/2022   Chronic kidney disease, stage 3b ([place]) 02/03/2022   History of total knee replacement, right 03/05/2018   Hypokalemia 05/19/2016   Sinus tachycardia 05/19/2016   OA (osteoarthritis) of knee 05/17/2016   Failed total right knee replacement ([place]) 11/04/2015   Failed total knee, right ([place]) 11/04/2015   S/P TAH (total abdominal hysterectomy) 10/08/2014   Pelvic prolapse 10/07/2014   Cystocele 08/16/2014   Glenohumeral arthritis 05/09/2014   Right knee DJD 08/29/2012    Class: Chronic    PCP: Tonna Boehringer, MD  REFERRING PROVIDER: Lyndal Pulley, DO   REFERRING DIAG: M54.42,M54.41,G89.29 (ICD-10-CM) - Chronic bilateral low back pain with bilateral sciatica   Rationale for Evaluation and Treatment Rehabilitation  THERAPY DIAG:  Other low back pain  Muscle weakness (generalized)  Difficulty in walking, not elsewhere classified  ONSET DATE: Chronic pain  ; MD visit/order 05/04/2022   SUBJECTIVE:  SUBJECTIVE STATEMENT: Pt reports she had a lot of pain last night, but slept hard.  Today she is surprised that her back "doesn't feel too bad".   PAIN:  Are you having pain? Yes NPRS:  Current: 3/10 Location:  central lower lumbar ; (no radicular symptoms today) Type: constant; aching; can be sharp at times  PERTINENT HISTORY:  -LATEX ALLERGY -Chronic back pain with x ray findings of significant DDD at T12-L1 and mild degenerative changes at T10-T11 and T11-T12 -Chronic fatigue syndrome, osteopenia, Cervical spondylosis, and Macular degeneratio -PSHx:  6 lumbar surgeries with L1-S1 fusion in 2014, bilat TKA, R TSA    PRECAUTIONS: Other: L1-S1 fusion in 2014, degenerative thoracic changes, osteopenia, bilat TKA, R TSA.  Pt has a LATEX ALLERGY.  WEIGHT BEARING RESTRICTIONS No  FALLS:  Has patient fallen in last 6 months? Yes. Number of falls 1; tripped over a large rock in her yard  LIVING ENVIRONMENT: Lives with: lives with their spouse Lives in: 1 level home Stairs: 4 steps to enter home with 1 rail Has following equipment at home: Single point cane and 3 wheeled walker  OCCUPATION: Pt is retired  PLOF: Independent; Pt has a Hx of lumbar pain. She has been using cane for 5-6 years.  PATIENT GOALS improve strength and balance.  Wants to be more steady on feet. To be able to stand straighter with ambulation   OBJECTIVE:   DIAGNOSTIC FINDINGS:  Xrays  IMPRESSION: 1. Postsurgical changes of prior PLIF with interbody grafts at each level and an interbody cage at L4-L5. No evidence of hardware complication. 2. Interval development of significant adjacent level degenerative disc disease at T12-L1. 3. More mild adjacent level degenerative changes also visualized at T10-T11 and  T11-T12.  IMPRESSION: Exaggerated thoracic kyphosis with adjacent level degenerative disc disease at T12-T11.  Changes of degenerative disc disease noted throughout the majority of the thoracic spine. No evidence of acute fracture.      LUMBAR ROM:    Active  AROM  eval AROM  Flexion WFL   Extension     Right lateral flexion 75% with pain 85 with pain  Left lateral flexion WFL   Right rotation     Left rotation      (Blank rows = not tested)     LOWER EXTREMITY MMT:     MMT Right eval Left eval Right/Left  Hip flexion 4/5 4+/5 5-/5  Hip extension       Hip abduction WFL tested in sitting WFL tested in sitting   Hip adduction       Hip internal rotation       Hip external rotation       Knee flexion 5/5 tested in sitting 5/5 tested in sitting   Knee extension 5/5 5/5   Ankle dorsiflexion 5/5 5/5   Ankle plantarflexion WFL tested in sitting WFL tested in sitting   Ankle inversion       Ankle eversion        (Blank rows = not tested)   LUMBAR SPECIAL TESTS:  Supine SLR test: positive bilat   FUNCTIONAL TESTS:  5x STS:  16 sec without UE's.      06/17/22   5x STS: 13s   TUG: 15s   Berg:38/56  BERG Balance Test          Date: 06/17/22  Sit to Stand 4  Standing unsupported 4  Sitting with back unsupported but feet supported 4  Stand to sit  3  Transfers  3  Standing unsupported with eyes closed 3  Standing unsupported feet together 3  From standing position, reach forward with outstretched arm 3  From standing position, pick up object from floor 3  From standing position, turn and look behind over each shoulder 2  Turn 360 2  Standing unsupported, alternately place foot on step 3  Standing unsupported, one foot in front 1  Standing on one leg 0  Total:  38/56      GAIT: Distance walked: approx 120 ft Assistive device utilized: Single point cane Level of assistance: Complete Independence Comments: Pt ambulates with increased Wb'ing thru cane and  has forward flexed posture the more she walks.  Pt had antalgic limp, increased fwd flexion, and lean to the L when ambulating without cane.      TODAY'S TREATMENT  Pt seen for aquatic therapy today.  Treatment took place in water 3.25-4.5 ft in depth at the [facility]. Temp of water was 91.  Pt entered/exited the pool via steps with 2 rails, indep.   Walking forward, Back, and side stepping   [place] x 2 laps, cues for relaxed knees when crossing Vertical suspension yellow noodle between legs: cycling; scissoring  SLS forward leans holding yellow noodle x 5 reps each  (reported increased pain afterwards) Side stepping with arms abdct/add with yellow hand buoys Forward/backward walking with yellow buoys at side Tandem gait forwards/ backwards with yellow hand buoys at surface Trunk rotation looking over shoulder x 4 each  SLS without support x 10-20s x 2 reps each, skulling UE to steady Forward gait with increased speed with pivot and return to start x 3 blue squoodle pull down x15  Pt requires the buoyancy and hydrostatic pressure of water for support, and to offload joints by unweighting joint load by at least 50 % in naval deep water and by at least 75-80% in chest to neck deep water.  Viscosity of the water is needed for resistance of strengthening. Water current perturbations provides challenge to standing balance requiring increased core activation.  * after pt was dried off - Sensitive skin Rock tape was applied (with pt sitting in erect position) to lumbar spine (over incision and almost to bra line) with 15-20% stretch. 2- I strip pieces of tape placed overtop creating star pattern with center of incision as center of star. Pt instructed in safe removal.   PATIENT EDUCATION:  Education details: Educated pt in exercise form, rationale of exercises.   Person educated: Patient Education method: Explanation Education comprehension: verbalized understanding   HOME  EXERCISE PROGRAM: Access Code: [email or web address] URL: [email or web address] Date: 05/26/2022 Prepared by: Ronny Flurry  Exercises - Seated Scapular Retraction  - 1-2 x daily - 5-6 x weekly - 2 sets - 10 reps - 3-5 seconds hold - Seated Shoulder Row with Anchored Resistance  - 1 x daily - 3-4 x weekly - 2-3 sets - 10 reps - Hooklying Clamshell with Resistance  - 1 x daily - 4 x weekly - 2 sets - 10 reps - Supine Transversus Abdominis Bracing - Hands on Stomach  - 2 x daily - 7 x weekly - 2 sets - 10 reps - Seated Hamstring Stretch  - 2 x daily - 7 x weekly - 2 reps - 20 seconds hold  ASSESSMENT:  CLINICAL IMPRESSION: Pt requires minor cues for upright posture and neutral head position (vs looking down), but is able to stand with more erect posture without UE support, than on land.  SLS without support continues to be challenging, but is gradually improving. She reported slight increase in back pain with SLS forward leans; reduced with change in exercise. She prefers suspending from yellow noodle under arms instead of straddling noodle. Trial of sensitive skin Rock tape applied to lower back for increased proprioception/ postural awareness. She will continue to benefit from skilled Physical Therapy to progress towards meeting goals and reaching her maximal potential decreasing fall risk and improving quality of life.  OBJECTIVE IMPAIRMENTS Abnormal gait, decreased activity tolerance, decreased balance, decreased endurance, decreased mobility, difficulty walking, decreased ROM, decreased strength, impaired flexibility, postural dysfunction, and pain.   ACTIVITY LIMITATIONS bending, standing, and locomotion level  PARTICIPATION LIMITATIONS: community activity and yard work/gardening  PERSONAL FACTORS Time since onset of injury/illness/exacerbation and 3+ comorbidities: Chronic fatigue syndrome, thoracic degenerative changes, bilat TKA, and multiple lumbar surgeries  are also affecting  patient's functional outcome.   REHAB POTENTIAL: Fair due to chronic pain and multiple lumbar surgeries  CLINICAL DECISION MAKING: Evolving/moderate complexity  EVALUATION COMPLEXITY: Moderate   GOALS:  SHORT TERM GOALS:   Pt will tolerate aquatic therapy without adverse effects for improved pain, function, strength, and tolerance to activity.  Baseline: Goal status: Achieved Target date:  05/21/2022  2.  Pt will report at least a 25% improvement in her posture with ambulation. Baseline:  Goal status: Achieved Target Goal:  06/04/2022  3.  Pt will be independent with land based HEP for improved core strength, pain, and function.  Baseline:  Goal status: Achieved Target Goal:  06/04/2022  4.  Pt will progress with aquatic exercises for improved core strength in order for improved posture with ambulation and standing activities.  Baseline:  Goal status: Achieved Target goal:  06/04/2022   LONG TERM GOALS: Target date: 08/06/2022  Pt will report at least a 60% improvement in standing straighter with her daily activities, mobility, and ambulation.  Baseline: 7-13:Pt reports 40% Goal status: INITIAL  2.  Pt will report she is able to ambulate her normal community distance without significant pain.  Baseline:  Goal status: INITIAL  3.  Pt will report improved stability with her daily mobility and ambulation.  Baseline: stability is better but not PLOF Goal status: Achieved  4.  Pt will report at least a 50% improvement in pain with gardening and her normal yard work.  Baseline: 7-13:Pt reports 30% Goal status: Ongoing  5. Pt will improve on Berg balance test to 50/56 or > to demonstrate improved balance decreasing fall risk. Baseline: 38/56 Goal: new     PLAN: PT FREQUENCY: 1-2xw  PT DURATION: 6 weeks  PLANNED INTERVENTIONS: Therapeutic exercises, Therapeutic activity, Neuromuscular re-education, Balance training, Gait training, Patient/Family education, Stair  training, Aquatic Therapy, Dry Needling, Electrical stimulation, Cryotherapy, Moist heat, Taping, Manual therapy, and Re-evaluation.  PLAN FOR NEXT SESSION: Cont with aquatic therapy.  Assess response to [facility] tape application. [place] if helpful.   Kerin Perna, PTA 06/25/22 3:54 PM

## 2022-06-28 ENCOUNTER — Encounter (HOSPITAL_BASED_OUTPATIENT_CLINIC_OR_DEPARTMENT_OTHER): Payer: Self-pay | Admitting: Physical Therapy

## 2022-06-28 ENCOUNTER — Ambulatory Visit (HOSPITAL_BASED_OUTPATIENT_CLINIC_OR_DEPARTMENT_OTHER): Payer: Medicare Other | Admitting: Physical Therapy

## 2022-06-28 DIAGNOSIS — R262 Difficulty in walking, not elsewhere classified: Secondary | ICD-10-CM

## 2022-06-28 DIAGNOSIS — M5459 Other low back pain: Secondary | ICD-10-CM | POA: Diagnosis not present

## 2022-06-28 DIAGNOSIS — M6281 Muscle weakness (generalized): Secondary | ICD-10-CM

## 2022-06-28 NOTE — Therapy (Signed)
OUTPATIENT PHYSICAL THERAPY THORACOLUMBAR TREATMENT   Patient Name: [name] [name] MRN: [id number] DOB:03/16/1941, 81 y.o., female Today's Date: 06/28/2022   PT End of Session - 06/28/22 1315     Visit Number 13    Number of Visits 22    Date for PT Re-Evaluation 07/29/22    Authorization Type Medicare A and B    PT Start Time 1115    PT Stop Time 1200    PT Time Calculation (min) 45 min    Activity Tolerance Patient tolerated treatment well    Behavior During Therapy WFL for tasks assessed/performed                  Past Medical History:  Diagnosis Date   Acquired left flat foot    Acquired leg length discrepancy    Allergic urticaria    Allergy    Anemia    took iron for a while   Arthritis    lower back, knees   Blood transfusion without reported diagnosis    Bronchitis    history only   Cataract    Chronic fatigue syndrome    Chronic pain    lower back   Contusion of left hand    Female bladder prolapse    Fracture of metatarsal bone    w nonunion    Gout    Hallux rigidus    History of measles    History of mumps    History of rubella    Hyperlipidemia    Hypertension    Hypothyroidism    Imbalance    Macular degeneration    wet- right eye  dry- left eye   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[place])    Metatarsalgia of left foot    Missed ab    x 6 - 2 surgery, 4 resolved on it's own   Neuromuscular disorder ([place])    , left foot has some numbeness- uses cane   Numbness    outer edge of heel left foot    Osteopenia    Pneumonia    PONV (postoperative nausea and vomiting)    states she had scop patch in past,states she sets off alarms when she is waking up, she "forgets to breathe"    Seasonal allergies    Shingles    Tinnitus    Trigger middle finger of left hand    Ulcer    hx   Urinary incontinence    Past Surgical History:  Procedure Laterality Date   ABDOMINAL HYSTERECTOMY Bilateral 10/07/2014   Procedure: TOTAL ABDOMINAL  HYSTERECTOMY WITH BILATERAL SALPINGO OOPHORECTOMY AND HALBANS CULDOELASTY ;  Surgeon: Lyman Speller, MD;  Location: [facility];  Service: Gynecology;  Laterality: Bilateral;  Miller primary/Silva assist  4 hours total   ANTERIOR AND POSTERIOR REPAIR N/A 10/07/2014   Procedure: ANTERIOR (CYSTOCELE) AND POSTERIOR REPAIR (RECTOCELE);  Surgeon: Lyman Speller, MD;  Location: [facility];  Service: Gynecology;  Laterality: N/A;   APPENDECTOMY     BACK SURGERY  09,00,10,99, 6/14,9/14   x6 surgeries on back -Spinal Fusin L4-L5, Lumbar Fusion L1-S1   BLADDER SUSPENSION N/A 10/07/2014   Procedure: TRANSVAGINAL TAPE (TVT) EXACT PROCEDURE;  Surgeon: Lyman Speller, MD;  Location: [facility];  Service: Gynecology;  Laterality: N/A;   BREAST EXCISIONAL BIOPSY Left 1998   benign   BREAST SURGERY     left cyst- benign   BUNIONECTOMY  03/02/11 and 11/04/11   left foot x2   CARPAL TUNNEL RELEASE  both   CATARACT EXTRACTION Left 08/03/2019   CATARACT EXTRACTION Right 08/03/2019   CYSTOSCOPY N/A 10/07/2014   Procedure: CYSTOSCOPY;  Surgeon: Lyman Speller, MD;  Location: [facility];  Service: Gynecology;  Laterality: N/A;   DILATION AND CURETTAGE OF UTERUS     x2   Eye injections Left    HAMMER TOE SURGERY  4/12   lt foot-2-3   I&D and spina abscess L4-5     JOINT REPLACEMENT     R- TKA- 2013   KNEE ARTHROSCOPY  2007   both   LUMBAR LAMINECTOMY  05/1999   L4-L5   METATARSAL OSTEOTOMY  3/12   lt   ORIF TOE FRACTURE  11/04/2011   Procedure: OPEN REDUCTION INTERNAL FIXATION (ORIF) METATARSAL (TOE) FRACTURE;  Surgeon: Wylene Simmer, MD;  Location: [facility];  Service: Orthopedics;  Laterality: Left;  left revision orif 1st metatarsal with autograft from left calcaneous and left 2nd-3rd metatarsal weil osteotomy   osteoma  12   forehead x2   OTHER SURGICAL HISTORY  05/08/13   hardware removed from left foot   removal of two osteomas     on forehead-2008   STERIOD INJECTION Left  11/04/2015   Procedure: STEROID INJECTION;  Surgeon: Gaynelle Arabian, MD;  Location: [facility];  Service: Orthopedics;  Laterality: Left;   THUMB FUSION  2008   rt   TOTAL KNEE ARTHROPLASTY  08/29/2012   Procedure: TOTAL KNEE ARTHROPLASTY;  Surgeon: Hessie Dibble, MD;  Location: [facility];  Service: Orthopedics;  Laterality: Right;   TOTAL KNEE ARTHROPLASTY Left 05/17/2016   Procedure: LEFT TOTAL KNEE ARTHROPLASTY;  Surgeon: Gaynelle Arabian, MD;  Location: [facility];  Service: Orthopedics;  Laterality: Left;   TOTAL KNEE ARTHROPLASTY Right    TOTAL KNEE REVISION Right 11/04/2015   Procedure: TOTAL KNEE ARTHROPLASTY REVISION;  Surgeon: Gaynelle Arabian, MD;  Location: [facility];  Service: Orthopedics;  Laterality: Right;   TOTAL SHOULDER ARTHROPLASTY Right 05/09/2014   Procedure: TOTAL SHOULDER ARTHROPLASTY;  Surgeon: Nita Sells, MD;  Location: [facility];  Service: Orthopedics;  Laterality: Right;  Right shoulder arthroplasty   TUBAL LIGATION     Patient Active Problem List   Diagnosis Date Noted   Chronic low back pain 03/04/2022   Chronic kidney disease, stage 3b ([place]) 02/03/2022   History of total knee replacement, right 03/05/2018   Hypokalemia 05/19/2016   Sinus tachycardia 05/19/2016   OA (osteoarthritis) of knee 05/17/2016   Failed total right knee replacement ([place]) 11/04/2015   Failed total knee, right ([place]) 11/04/2015   S/P TAH (total abdominal hysterectomy) 10/08/2014   Pelvic prolapse 10/07/2014   Cystocele 08/16/2014   Glenohumeral arthritis 05/09/2014   Right knee DJD 08/29/2012    Class: Chronic    PCP: Tonna Boehringer, MD  REFERRING PROVIDER: Lyndal Pulley, DO   REFERRING DIAG: M54.42,M54.41,G89.29 (ICD-10-CM) - Chronic bilateral low back pain with bilateral sciatica   Rationale for Evaluation and Treatment Rehabilitation  THERAPY DIAG:  Other low back pain  Muscle weakness (generalized)  Difficulty in walking, not elsewhere classified  ONSET DATE: Chronic pain  ; MD visit/order 05/04/2022   SUBJECTIVE:  SUBJECTIVE STATEMENT: Pt reports increased activity over weekend increasing some pain.   PAIN:  Are you having pain? Yes NPRS:  Current: 5/10 Location:  central lower lumbar ; (no radicular symptoms today) Type: constant; aching; can be sharp at times  PERTINENT HISTORY:  -LATEX ALLERGY -Chronic back pain with x ray findings of significant DDD at T12-L1 and mild degenerative changes at T10-T11 and T11-T12 -Chronic fatigue syndrome, osteopenia, Cervical spondylosis, and Macular degeneratio -PSHx:  6 lumbar surgeries with L1-S1 fusion in 2014, bilat TKA, R TSA    PRECAUTIONS: Other: L1-S1 fusion in 2014, degenerative thoracic changes, osteopenia, bilat TKA, R TSA.  Pt has a LATEX ALLERGY.  WEIGHT BEARING RESTRICTIONS No  FALLS:  Has patient fallen in last 6 months? Yes. Number of falls 1; tripped over a large rock in her yard  LIVING ENVIRONMENT: Lives with: lives with their spouse Lives in: 1 level home Stairs: 4 steps to enter home with 1 rail Has following equipment at home: Single point cane and 3 wheeled walker  OCCUPATION: Pt is retired  PLOF: Independent; Pt has a Hx of lumbar pain. She has been using cane for 5-6 years.  PATIENT GOALS improve strength and balance.  Wants to be more steady on feet. To be able to stand straighter with ambulation   OBJECTIVE:   DIAGNOSTIC FINDINGS:  Xrays  IMPRESSION: 1. Postsurgical changes of prior PLIF with interbody grafts at each level and an interbody cage at L4-L5. No evidence of hardware complication. 2. Interval development of significant adjacent level degenerative disc disease at T12-L1. 3. More mild adjacent level degenerative changes also visualized at T10-T11 and T11-T12.  IMPRESSION: Exaggerated thoracic kyphosis with adjacent  level degenerative disc disease at T12-T11.  Changes of degenerative disc disease noted throughout the majority of the thoracic spine. No evidence of acute fracture.      LUMBAR ROM:    Active  AROM  eval AROM  Flexion WFL   Extension     Right lateral flexion 75% with pain 85 with pain  Left lateral flexion WFL   Right rotation     Left rotation      (Blank rows = not tested)     LOWER EXTREMITY MMT:     MMT Right eval Left eval Right/Left  Hip flexion 4/5 4+/5 5-/5  Hip extension       Hip abduction WFL tested in sitting WFL tested in sitting   Hip adduction       Hip internal rotation       Hip external rotation       Knee flexion 5/5 tested in sitting 5/5 tested in sitting   Knee extension 5/5 5/5   Ankle dorsiflexion 5/5 5/5   Ankle plantarflexion WFL tested in sitting WFL tested in sitting   Ankle inversion       Ankle eversion        (Blank rows = not tested)   LUMBAR SPECIAL TESTS:  Supine SLR test: positive bilat   FUNCTIONAL TESTS:  5x STS:  16 sec without UE's.      06/17/22   5x STS: 13s   TUG: 15s   Berg:38/56  BERG Balance Test          Date: 06/17/22  Sit to Stand 4  Standing unsupported 4  Sitting with back unsupported but feet supported 4  Stand to sit  3  Transfers  3  Standing unsupported with eyes closed 3  Standing unsupported feet together 3  From standing position, reach forward with outstretched arm 3  From standing position, pick up object from floor 3  From standing position, turn and look behind over each shoulder 2  Turn 360 2  Standing unsupported, alternately place foot on step 3  Standing unsupported, one foot in front 1  Standing on one leg 0  Total:  38/56      GAIT: Distance walked: approx 120 ft Assistive device utilized: Single point cane Level of assistance: Complete Independence Comments: Pt ambulates with increased Wb'ing thru cane and has forward flexed posture the more she walks.  Pt had antalgic limp,  increased fwd flexion, and lean to the L when ambulating without cane.      TODAY'S TREATMENT  Pt seen for aquatic therapy today.  Treatment took place in water 3.25-4.5 ft in depth at the [facility]. Temp of water was 92.  Pt entered/exited the pool via steps with 2 rails, indep.   Walking forward, Back, and side stepping   [place] x 2 laps, cues for relaxed knees when crossing Vertical suspension yellow noodle between legs: cycling; scissoring  Side stepping with arms abdct/add with yellow hand buoys Forward/backward walking with yellow buoys at side Tandem gait forwards/ backwards with yellow hand buoys at surface SLS without support x 30s+ x 2 reps each, Ue supported on 1 foam hand buoys; progressed to unsupported, RLE x 30s, R x 25s some sculling Standing DF/PF UE with 1 foam hand buoys x 20. Cues for weight shift/maintaining balance. blue squoodle pull down 2x15: Lumbar rotation x10 between     Pt requires the buoyancy and hydrostatic pressure of water for support, and to offload joints by unweighting joint load by at least 50 % in naval deep water and by at least 75-80% in chest to neck deep water.  Viscosity of the water is needed for resistance of strengthening. Water current perturbations provides challenge to standing balance requiring increased core activation.  * after pt was dried off - Sensitive skin Rock tape was applied (with pt sitting in erect position) to lumbar spine (over incision and almost to bra line) with 15-20% stretch. 2- I strip pieces of tape placed overtop creating star pattern with center of incision as center of star. Pt instructed in safe removal.   PATIENT EDUCATION:  Education details: Educated pt in exercise form, rationale of exercises.   Person educated: Patient Education method: Explanation Education comprehension: verbalized understanding   HOME EXERCISE PROGRAM: Access Code: [email or web address] URL:  [email or web address] Date: 05/26/2022 Prepared by: Ronny Flurry  Exercises - Seated Scapular Retraction  - 1-2 x daily - 5-6 x weekly - 2 sets - 10 reps - 3-5 seconds hold - Seated Shoulder Row with Anchored Resistance  - 1 x daily - 3-4 x weekly - 2-3 sets - 10 reps - Hooklying Clamshell with Resistance  - 1 x daily - 4 x weekly - 2 sets - 10 reps - Supine Transversus Abdominis Bracing - Hands on Stomach  - 2 x daily - 7 x weekly - 2 sets - 10 reps - Seated Hamstring Stretch  - 2 x daily - 7 x weekly - 2 reps - 20 seconds hold  ASSESSMENT:  CLINICAL IMPRESSION: Improved SLS unsupported increasing time in position. Minor cues for postioning/posture with execution of standing exercise and balance challenges.  Good response to session today without increased discomfort. She will continue to benefit from skilled Physical Therapy to progress towards meeting goals and reaching her maximal potential  decreasing fall risk and improving quality of life.   OBJECTIVE IMPAIRMENTS Abnormal gait, decreased activity tolerance, decreased balance, decreased endurance, decreased mobility, difficulty walking, decreased ROM, decreased strength, impaired flexibility, postural dysfunction, and pain.   ACTIVITY LIMITATIONS bending, standing, and locomotion level  PARTICIPATION LIMITATIONS: community activity and yard work/gardening  PERSONAL FACTORS Time since onset of injury/illness/exacerbation and 3+ comorbidities: Chronic fatigue syndrome, thoracic degenerative changes, bilat TKA, and multiple lumbar surgeries  are also affecting patient's functional outcome.   REHAB POTENTIAL: Fair due to chronic pain and multiple lumbar surgeries  CLINICAL DECISION MAKING: Evolving/moderate complexity  EVALUATION COMPLEXITY: Moderate   GOALS:  SHORT TERM GOALS:   Pt will tolerate aquatic therapy without adverse effects for improved pain, function, strength, and tolerance to activity.   Baseline: Goal status: Achieved Target date:  05/21/2022  2.  Pt will report at least a 25% improvement in her posture with ambulation. Baseline:  Goal status: Achieved Target Goal:  06/04/2022  3.  Pt will be independent with land based HEP for improved core strength, pain, and function.  Baseline:  Goal status: Achieved Target Goal:  06/04/2022  4.  Pt will progress with aquatic exercises for improved core strength in order for improved posture with ambulation and standing activities.  Baseline:  Goal status: Achieved Target goal:  06/04/2022   LONG TERM GOALS: Target date: 08/09/2022  Pt will report at least a 60% improvement in standing straighter with her daily activities, mobility, and ambulation.  Baseline: 7-13:Pt reports 40% Goal status: INITIAL  2.  Pt will report she is able to ambulate her normal community distance without significant pain.  Baseline:  Goal status: INITIAL  3.  Pt will report improved stability with her daily mobility and ambulation.  Baseline: stability is better but not PLOF Goal status: Achieved  4.  Pt will report at least a 50% improvement in pain with gardening and her normal yard work.  Baseline: 7-13:Pt reports 30% Goal status: Ongoing  5. Pt will improve on Berg balance test to 50/56 or > to demonstrate improved balance decreasing fall risk. Baseline: 38/56 Goal: new     PLAN: PT FREQUENCY: 1-2xw  PT DURATION: 6 weeks  PLANNED INTERVENTIONS: Therapeutic exercises, Therapeutic activity, Neuromuscular re-education, Balance training, Gait training, Patient/Family education, Stair training, Aquatic Therapy, Dry Needling, Electrical stimulation, Cryotherapy, Moist heat, Taping, Manual therapy, and Re-evaluation.  PLAN FOR NEXT SESSION: Cont with aquatic therapy.  Assess response to [facility] tape application. [place] if helpful.  Stanton Kidney Tharon Aquas) Julie Paolini MPT 06/28/22 1:17 PM

## 2022-06-29 ENCOUNTER — Ambulatory Visit: Payer: Medicare Other | Admitting: Family Medicine

## 2022-07-02 ENCOUNTER — Ambulatory Visit (HOSPITAL_BASED_OUTPATIENT_CLINIC_OR_DEPARTMENT_OTHER): Payer: Medicare Other | Admitting: Physical Therapy

## 2022-07-02 ENCOUNTER — Encounter (HOSPITAL_BASED_OUTPATIENT_CLINIC_OR_DEPARTMENT_OTHER): Payer: Self-pay | Admitting: Physical Therapy

## 2022-07-02 DIAGNOSIS — M5459 Other low back pain: Secondary | ICD-10-CM | POA: Diagnosis not present

## 2022-07-02 DIAGNOSIS — M6281 Muscle weakness (generalized): Secondary | ICD-10-CM

## 2022-07-02 DIAGNOSIS — R262 Difficulty in walking, not elsewhere classified: Secondary | ICD-10-CM

## 2022-07-02 NOTE — Therapy (Signed)
OUTPATIENT PHYSICAL THERAPY THORACOLUMBAR TREATMENT   Patient Name: [name] [name] MRN: [id number] DOB:[birth year], 81 y.o., female Today's Date: 07/02/2022   PT End of Session - 07/02/22 1119     Visit Number 14    Number of Visits 22    Date for PT Re-Evaluation 07/29/22    Authorization Type Medicare A and B    PT Start Time 1116    PT Stop Time 1200    PT Time Calculation (min) 44 min    Activity Tolerance Patient tolerated treatment well    Behavior During Therapy WFL for tasks assessed/performed                  Past Medical History:  Diagnosis Date   Acquired left flat foot    Acquired leg length discrepancy    Allergic urticaria    Allergy    Anemia    took iron for a while   Arthritis    lower back, knees   Blood transfusion without reported diagnosis    Bronchitis    history only   Cataract    Chronic fatigue syndrome    Chronic pain    lower back   Contusion of left hand    Female bladder prolapse    Fracture of metatarsal bone    w nonunion    Gout    Hallux rigidus    History of measles    History of mumps    History of rubella    Hyperlipidemia    Hypertension    Hypothyroidism    Imbalance    Macular degeneration    wet- right eye  dry- left eye   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[place])    Metatarsalgia of left foot    Missed ab    x 6 - 2 surgery, 4 resolved on it's own   Neuromuscular disorder ([place])    , left foot has some numbeness- uses cane   Numbness    outer edge of heel left foot    Osteopenia    Pneumonia    PONV (postoperative nausea and vomiting)    states she had scop patch in past,states she sets off alarms when she is waking up, she "forgets to breathe"    Seasonal allergies    Shingles    Tinnitus    Trigger middle finger of left hand    Ulcer    hx   Urinary incontinence    Past Surgical History:  Procedure Laterality Date   ABDOMINAL HYSTERECTOMY Bilateral 10/07/2014   Procedure: TOTAL ABDOMINAL  HYSTERECTOMY WITH BILATERAL SALPINGO OOPHORECTOMY AND HALBANS CULDOELASTY ;  Surgeon: Lyman Speller, MD;  Location: [facility];  Service: Gynecology;  Laterality: Bilateral;  Miller primary/Silva assist  4 hours total   ANTERIOR AND POSTERIOR REPAIR N/A 10/07/2014   Procedure: ANTERIOR (CYSTOCELE) AND POSTERIOR REPAIR (RECTOCELE);  Surgeon: Lyman Speller, MD;  Location: [facility];  Service: Gynecology;  Laterality: N/A;   APPENDECTOMY     BACK SURGERY  09,00,10,99, 6/14,9/14   x6 surgeries on back -Spinal Fusin L4-L5, Lumbar Fusion L1-S1   BLADDER SUSPENSION N/A 10/07/2014   Procedure: TRANSVAGINAL TAPE (TVT) EXACT PROCEDURE;  Surgeon: Lyman Speller, MD;  Location: [facility];  Service: Gynecology;  Laterality: N/A;   BREAST EXCISIONAL BIOPSY Left 1998   benign   BREAST SURGERY     left cyst- benign   BUNIONECTOMY  03/02/11 and 11/04/11   left foot x2   CARPAL TUNNEL RELEASE  both   CATARACT EXTRACTION Left 08/03/2019   CATARACT EXTRACTION Right 08/03/2019   CYSTOSCOPY N/A 10/07/2014   Procedure: CYSTOSCOPY;  Surgeon: Lyman Speller, MD;  Location: [facility];  Service: Gynecology;  Laterality: N/A;   DILATION AND CURETTAGE OF UTERUS     x2   Eye injections Left    HAMMER TOE SURGERY  4/12   lt foot-2-3   I&D and spina abscess L4-5     JOINT REPLACEMENT     R- TKA- 2013   KNEE ARTHROSCOPY  2007   both   LUMBAR LAMINECTOMY  05/1999   L4-L5   METATARSAL OSTEOTOMY  3/12   lt   ORIF TOE FRACTURE  11/04/2011   Procedure: OPEN REDUCTION INTERNAL FIXATION (ORIF) METATARSAL (TOE) FRACTURE;  Surgeon: Wylene Simmer, MD;  Location: [facility];  Service: Orthopedics;  Laterality: Left;  left revision orif 1st metatarsal with autograft from left calcaneous and left 2nd-3rd metatarsal weil osteotomy   osteoma  12   forehead x2   OTHER SURGICAL HISTORY  05/08/13   hardware removed from left foot   removal of two osteomas     on forehead-2008   STERIOD INJECTION Left  11/04/2015   Procedure: STEROID INJECTION;  Surgeon: Gaynelle Arabian, MD;  Location: [facility];  Service: Orthopedics;  Laterality: Left;   THUMB FUSION  2008   rt   TOTAL KNEE ARTHROPLASTY  08/29/2012   Procedure: TOTAL KNEE ARTHROPLASTY;  Surgeon: Hessie Dibble, MD;  Location: [facility];  Service: Orthopedics;  Laterality: Right;   TOTAL KNEE ARTHROPLASTY Left 05/17/2016   Procedure: LEFT TOTAL KNEE ARTHROPLASTY;  Surgeon: Gaynelle Arabian, MD;  Location: [facility];  Service: Orthopedics;  Laterality: Left;   TOTAL KNEE ARTHROPLASTY Right    TOTAL KNEE REVISION Right 11/04/2015   Procedure: TOTAL KNEE ARTHROPLASTY REVISION;  Surgeon: Gaynelle Arabian, MD;  Location: [facility];  Service: Orthopedics;  Laterality: Right;   TOTAL SHOULDER ARTHROPLASTY Right 05/09/2014   Procedure: TOTAL SHOULDER ARTHROPLASTY;  Surgeon: Nita Sells, MD;  Location: [facility];  Service: Orthopedics;  Laterality: Right;  Right shoulder arthroplasty   TUBAL LIGATION     Patient Active Problem List   Diagnosis Date Noted   Chronic low back pain 03/04/2022   Chronic kidney disease, stage 3b ([place]) 02/03/2022   History of total knee replacement, right 03/05/2018   Hypokalemia 05/19/2016   Sinus tachycardia 05/19/2016   OA (osteoarthritis) of knee 05/17/2016   Failed total right knee replacement ([place]) 11/04/2015   Failed total knee, right ([place]) 11/04/2015   S/P TAH (total abdominal hysterectomy) 10/08/2014   Pelvic prolapse 10/07/2014   Cystocele 08/16/2014   Glenohumeral arthritis 05/09/2014   Right knee DJD 08/29/2012    Class: Chronic    PCP: Tonna Boehringer, MD  REFERRING PROVIDER: Lyndal Pulley, DO   REFERRING DIAG: M54.42,M54.41,G89.29 (ICD-10-CM) - Chronic bilateral low back pain with bilateral sciatica   Rationale for Evaluation and Treatment Rehabilitation  THERAPY DIAG:  Other low back pain  Muscle weakness (generalized)  Difficulty in walking, not elsewhere classified  ONSET DATE: Chronic pain  ; MD visit/order 05/04/2022   SUBJECTIVE:  SUBJECTIVE STATEMENT: Pt reports personal stressors, slight decrease in knee pain, frustrated.   PAIN:  Are you having pain? Yes NPRS:  Current: 3/10 Location:  central lower lumbar ; (no radicular symptoms today) Type: constant; aching; can be sharp at times  PERTINENT HISTORY:  -LATEX ALLERGY -Chronic back pain with x ray findings of significant DDD at T12-L1 and mild degenerative changes at T10-T11 and T11-T12 -Chronic fatigue syndrome, osteopenia, Cervical spondylosis, and Macular degeneratio -PSHx:  6 lumbar surgeries with L1-S1 fusion in 2014, bilat TKA, R TSA    PRECAUTIONS: Other: L1-S1 fusion in 2014, degenerative thoracic changes, osteopenia, bilat TKA, R TSA.  Pt has a LATEX ALLERGY.  WEIGHT BEARING RESTRICTIONS No  FALLS:  Has patient fallen in last 6 months? Yes. Number of falls 1; tripped over a large rock in her yard  LIVING ENVIRONMENT: Lives with: lives with their spouse Lives in: 1 level home Stairs: 4 steps to enter home with 1 rail Has following equipment at home: Single point cane and 3 wheeled walker  OCCUPATION: Pt is retired  PLOF: Independent; Pt has a Hx of lumbar pain. She has been using cane for 5-6 years.  PATIENT GOALS improve strength and balance.  Wants to be more steady on feet. To be able to stand straighter with ambulation   OBJECTIVE:   DIAGNOSTIC FINDINGS:  Xrays  IMPRESSION: 1. Postsurgical changes of prior PLIF with interbody grafts at each level and an interbody cage at L4-L5. No evidence of hardware complication. 2. Interval development of significant adjacent level degenerative disc disease at T12-L1. 3. More mild adjacent level degenerative changes also visualized at T10-T11 and T11-T12.  IMPRESSION: Exaggerated thoracic kyphosis with  adjacent level degenerative disc disease at T12-T11.  Changes of degenerative disc disease noted throughout the majority of the thoracic spine. No evidence of acute fracture.      LUMBAR ROM:    Active  AROM  eval AROM  Flexion WFL   Extension     Right lateral flexion 75% with pain 85 with pain  Left lateral flexion WFL   Right rotation     Left rotation      (Blank rows = not tested)     LOWER EXTREMITY MMT:     MMT Right eval Left eval Right/Left  Hip flexion 4/5 4+/5 5-/5  Hip extension       Hip abduction WFL tested in sitting WFL tested in sitting   Hip adduction       Hip internal rotation       Hip external rotation       Knee flexion 5/5 tested in sitting 5/5 tested in sitting   Knee extension 5/5 5/5   Ankle dorsiflexion 5/5 5/5   Ankle plantarflexion WFL tested in sitting WFL tested in sitting   Ankle inversion       Ankle eversion        (Blank rows = not tested)   LUMBAR SPECIAL TESTS:  Supine SLR test: positive bilat   FUNCTIONAL TESTS:  5x STS:  16 sec without UE's.      06/17/22   5x STS: 13s   TUG: 15s   Berg:38/56  BERG Balance Test          Date: 06/17/22  Sit to Stand 4  Standing unsupported 4  Sitting with back unsupported but feet supported 4  Stand to sit  3  Transfers  3  Standing unsupported with eyes closed 3  Standing unsupported feet together  3  From standing position, reach forward with outstretched arm 3  From standing position, pick up object from floor 3  From standing position, turn and look behind over each shoulder 2  Turn 360 2  Standing unsupported, alternately place foot on step 3  Standing unsupported, one foot in front 1  Standing on one leg 0  Total:  38/56      GAIT: Distance walked: approx 120 ft Assistive device utilized: Single point cane Level of assistance: Complete Independence Comments: Pt ambulates with increased Wb'ing thru cane and has forward flexed posture the more she walks.  Pt had  antalgic limp, increased fwd flexion, and lean to the L when ambulating without cane.      TODAY'S TREATMENT  Pt seen for aquatic therapy today.  Treatment took place in water 3.25-4.5 ft in depth at the [facility]. Temp of water was 92.  Pt entered/exited the pool via steps with 2 rails, indep.   Walking forward, Back, and side stepping   [place] x 2 laps, cues for relaxed knees when crossing Hip hiking R (glut strengthening) on bottom step 2x10. Cues for level pelvis with eccentric and concentric movements Side stepping with arms abdct/add with yellow hand buoys Forward/backward walking with yellow buoys at side cues for increased speed Tandem gait forwards/ backwards unsupported forward and back x 3 widths ea SLS without support x 30s+ x 2 reps each, Ue supported on 1 foam hand buoys; progressed to unsupported, RLE x 30s, R x 25s some sculling Standing DF/PF UE with 1 foam hand buoys x 20. Cues for weight shift/maintaining balance. blue squoodle pull down 2x15: Lumbar rotation x10 between     Pt requires the buoyancy and hydrostatic pressure of water for support, and to offload joints by unweighting joint load by at least 50 % in naval deep water and by at least 75-80% in chest to neck deep water.  Viscosity of the water is needed for resistance of strengthening. Water current perturbations provides challenge to standing balance requiring increased core activation.   PATIENT EDUCATION:  Education details: Educated pt in exercise form, rationale of exercises.   Person educated: Patient Education method: Explanation Education comprehension: verbalized understanding   HOME EXERCISE PROGRAM: Access Code: [email or web address] URL: [email or web address] Date: 05/26/2022 Prepared by: Ronny Flurry  Exercises - Seated Scapular Retraction  - 1-2 x daily - 5-6 x weekly - 2 sets - 10 reps - 3-5 seconds hold - Seated Shoulder Row with Anchored Resistance  - 1 x daily -  3-4 x weekly - 2-3 sets - 10 reps - Hooklying Clamshell with Resistance  - 1 x daily - 4 x weekly - 2 sets - 10 reps - Supine Transversus Abdominis Bracing - Hands on Stomach  - 2 x daily - 7 x weekly - 2 sets - 10 reps - Seated Hamstring Stretch  - 2 x daily - 7 x weekly - 2 reps - 20 seconds hold  ASSESSMENT:  CLINICAL IMPRESSION: 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 Has fabricated orthotics x 5 yrs.  L trendelenburg/ R abductor weakness identified.  She will contact orthotist to see if insurance will reimburse for new orthotics to see if they can address the LL difference to improve gait decreasing fall risk. Will focus more going forward on R abductor strength.     OBJECTIVE IMPAIRMENTS Abnormal gait, decreased activity tolerance, decreased balance, decreased endurance, decreased mobility, difficulty walking, decreased ROM, decreased strength, impaired flexibility,  postural dysfunction, and pain.   ACTIVITY LIMITATIONS bending, standing, and locomotion level  PARTICIPATION LIMITATIONS: community activity and yard work/gardening  PERSONAL FACTORS Time since onset of injury/illness/exacerbation and 3+ comorbidities: Chronic fatigue syndrome, thoracic degenerative changes, bilat TKA, and multiple lumbar surgeries  are also affecting patient's functional outcome.   REHAB POTENTIAL: Fair due to chronic pain and multiple lumbar surgeries  CLINICAL DECISION MAKING: Evolving/moderate complexity  EVALUATION COMPLEXITY: Moderate   GOALS:  SHORT TERM GOALS:   Pt will tolerate aquatic therapy without adverse effects for improved pain, function, strength, and tolerance to activity.  Baseline: Goal status: Achieved Target date:  05/21/2022  2.  Pt will report at least a 25% improvement in her posture with ambulation. Baseline:  Goal status: Achieved Target Goal:  06/04/2022  3.  Pt will be independent with land based HEP for improved core strength, pain, and  function.  Baseline:  Goal status: Achieved Target Goal:  06/04/2022  4.  Pt will progress with aquatic exercises for improved core strength in order for improved posture with ambulation and standing activities.  Baseline:  Goal status: Achieved Target goal:  06/04/2022   LONG TERM GOALS: Target date: 08/13/2022  Pt will report at least a 60% improvement in standing straighter with her daily activities, mobility, and ambulation.  Baseline: 7-13:Pt reports 40% Goal status: INITIAL  2.  Pt will report she is able to ambulate her normal community distance without significant pain.  Baseline:  Goal status: INITIAL  3.  Pt will report improved stability with her daily mobility and ambulation.  Baseline: stability is better but not PLOF Goal status: Achieved  4.  Pt will report at least a 50% improvement in pain with gardening and her normal yard work.  Baseline: 7-13:Pt reports 30% Goal status: Ongoing  5. Pt will improve on Berg balance test to 50/56 or > to demonstrate improved balance decreasing fall risk. Baseline: 38/56 Goal: new     PLAN: PT FREQUENCY: 1-2xw  PT DURATION: 6 weeks  PLANNED INTERVENTIONS: Therapeutic exercises, Therapeutic activity, Neuromuscular re-education, Balance training, Gait training, Patient/Family education, Stair training, Aquatic Therapy, Dry Needling, Electrical stimulation, Cryotherapy, Moist heat, Taping, Manual therapy, and Re-evaluation.  PLAN FOR NEXT SESSION: Banded side stepping. Cont with aquatic therapy.  Assess response to [facility] tape application. Retape if helpful.  Stanton Kidney Tharon Aquas) Zada Haser MPT 07/02/22 1:45 PM

## 2022-07-05 NOTE — Progress Notes (Unsigned)
[place] Phone: [phone] Subjective:   [name], am serving as a scribe for Dr. Hulan Saas.  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  Discussed with patient about icing regimen and home exercises, discussed which activities to do and which ones to avoid, increase activity slowly otherwise.  Follow-up with me again in 6 to 8 weeks.  Updated 07/06/2022 [name] [name] is a 81 y.o. female coming in with complaint of back pain.  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  Patient was to continue conservative therapy.  Patient has been working hard and going to formal physical therapy at least twice a week. Patient's pain is the same as last visit. Physical therapy has been helpful with balance and stability.   Medications include gabapentin and Zanaflex.    Past Medical History:  Diagnosis Date   Acquired left flat foot    Acquired leg length discrepancy    Allergic urticaria    Allergy    Anemia    took iron for a while   Arthritis    lower back, knees   Blood transfusion without reported diagnosis    Bronchitis    history only   Cataract    Chronic fatigue syndrome    Chronic pain    lower back   Contusion of left hand    Female bladder prolapse    Fracture of metatarsal bone    w nonunion    Gout    Hallux rigidus    History of measles    History of mumps    History of rubella    Hyperlipidemia    Hypertension    Hypothyroidism    Imbalance    Macular degeneration    wet- right eye  dry- left eye   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[place])    Metatarsalgia of left foot    Missed ab    x 6 - 2  surgery, 4 resolved on it's own   Neuromuscular disorder ([place])    , left foot has some numbeness- uses cane   Numbness    outer edge of heel left foot    Osteopenia    Pneumonia    PONV (postoperative nausea and vomiting)    states she had scop patch in past,states she sets off alarms when she is waking up, she "forgets to breathe"    Seasonal allergies    Shingles    Tinnitus    Trigger middle finger of left hand    Ulcer    hx   Urinary incontinence    Past Surgical History:  Procedure Laterality Date   ABDOMINAL HYSTERECTOMY Bilateral 10/07/2014   Procedure: TOTAL ABDOMINAL HYSTERECTOMY WITH BILATERAL SALPINGO OOPHORECTOMY AND HALBANS CULDOELASTY ;  Surgeon: Lyman Speller, MD;  Location: [facility];  Service: Gynecology;  Laterality: Bilateral;  Miller primary/Silva assist  4 hours total   ANTERIOR AND POSTERIOR REPAIR N/A 10/07/2014   Procedure: ANTERIOR (CYSTOCELE) AND POSTERIOR REPAIR (RECTOCELE);  Surgeon: Lyman Speller, MD;  Location: [facility];  Service: Gynecology;  Laterality: N/A;   APPENDECTOMY     BACK SURGERY  09,00,10,99, 6/14,9/14  x6 surgeries on back -Spinal Fusin L4-L5, Lumbar Fusion L1-S1   BLADDER SUSPENSION N/A 10/07/2014   Procedure: TRANSVAGINAL TAPE (TVT) EXACT PROCEDURE;  Surgeon: Lyman Speller, MD;  Location: [facility];  Service: Gynecology;  Laterality: N/A;   BREAST EXCISIONAL BIOPSY Left 1998   benign   BREAST SURGERY     left cyst- benign   BUNIONECTOMY  03/02/11 and 11/04/11   left foot x2   CARPAL TUNNEL RELEASE     both   CATARACT EXTRACTION Left 08/03/2019   CATARACT EXTRACTION Right 08/03/2019   CYSTOSCOPY N/A 10/07/2014   Procedure: CYSTOSCOPY;  Surgeon: Lyman Speller, MD;  Location: [facility];  Service: Gynecology;  Laterality: N/A;   DILATION AND CURETTAGE OF UTERUS     x2   Eye injections Left    HAMMER TOE SURGERY  4/12   lt foot-2-3   I&D and spina abscess L4-5     JOINT REPLACEMENT     R- TKA- 2013   KNEE ARTHROSCOPY   2007   both   LUMBAR LAMINECTOMY  05/1999   L4-L5   METATARSAL OSTEOTOMY  3/12   lt   ORIF TOE FRACTURE  11/04/2011   Procedure: OPEN REDUCTION INTERNAL FIXATION (ORIF) METATARSAL (TOE) FRACTURE;  Surgeon: Wylene Simmer, MD;  Location: [facility];  Service: Orthopedics;  Laterality: Left;  left revision orif 1st metatarsal with autograft from left calcaneous and left 2nd-3rd metatarsal weil osteotomy   osteoma  12   forehead x2   OTHER SURGICAL HISTORY  05/08/13   hardware removed from left foot   removal of two osteomas     on forehead-2008   STERIOD INJECTION Left 11/04/2015   Procedure: STEROID INJECTION;  Surgeon: Gaynelle Arabian, MD;  Location: [facility];  Service: Orthopedics;  Laterality: Left;   THUMB FUSION  2008   rt   TOTAL KNEE ARTHROPLASTY  08/29/2012   Procedure: TOTAL KNEE ARTHROPLASTY;  Surgeon: Hessie Dibble, MD;  Location: [facility];  Service: Orthopedics;  Laterality: Right;   TOTAL KNEE ARTHROPLASTY Left 05/17/2016   Procedure: LEFT TOTAL KNEE ARTHROPLASTY;  Surgeon: Gaynelle Arabian, MD;  Location: [facility];  Service: Orthopedics;  Laterality: Left;   TOTAL KNEE ARTHROPLASTY Right    TOTAL KNEE REVISION Right 11/04/2015   Procedure: TOTAL KNEE ARTHROPLASTY REVISION;  Surgeon: Gaynelle Arabian, MD;  Location: [facility];  Service: Orthopedics;  Laterality: Right;   TOTAL SHOULDER ARTHROPLASTY Right 05/09/2014   Procedure: TOTAL SHOULDER ARTHROPLASTY;  Surgeon: Nita Sells, MD;  Location: [facility];  Service: Orthopedics;  Laterality: Right;  Right shoulder arthroplasty   TUBAL LIGATION     Social History   Socioeconomic History   Marital status: Married    Spouse name: Not on file   Number of children: Not on file   Years of education: Not on file   Highest education level: Not on file  Occupational History   Not on file  Tobacco Use   Smoking status: Never   Smokeless tobacco: Never  Vaping Use   Vaping Use: Never used  Substance and Sexual Activity    Alcohol use: Yes    Alcohol/week: 1.0 standard drink of alcohol    Types: 1 Standard drinks or equivalent per week    Comment: glass of wine occas   Drug use: No   Sexual activity: Yes    Partners: Male    Birth control/protection: Post-menopausal    Comment: TAH/BSO  Other Topics Concern   Not on  file  Social History Narrative   Not on file   Social Determinants of Health   Financial Resource Strain: Not on file  Food Insecurity: Not on file  Transportation Needs: Not on file  Physical Activity: Not on file  Stress: Not on file  Social Connections: Not on file   Allergies  Allergen Reactions   Oxycodone Hives    '10mg'$  pink pill, allergic only to pink pills, if not pink she can take   Flexeril [Cyclobenzaprine] Hives   Red Dye Hives and Swelling   Adhesive [Tape] Other (See Comments)    Blisters - Tolerates paper tape   Ciprofloxacin Hives   Crab [Shellfish Allergy] Swelling and Other (See Comments)    Lip swellig with artificial crab meat   Etodolac    Povidone Iodine Hives, Swelling and Dermatitis    Betadine   Pravastatin Hives and Other (See Comments)    Tolerates atorvastatin   Purell Instant Hand [Homeopathic Products]    Tylenol [Acetaminophen] Hives and Other (See Comments)    Makes her feel funny. Cannot take Tylenol in combination products (Percocet, Vicodin), but can take it alone Tolerates opiates individually except with red dye   Latex Hives and Other (See Comments)    Blisters   Lisinopril Hives   Methocarbamol Hives   Morphine And Related Other (See Comments)    Does not provide any pain relief   Sulfa Antibiotics Hives   Family History  Problem Relation Age of Onset   Hyperlipidemia Father    Hypertension Father    Heart attack Father 33       deceased   Heart attack Brother 54   Breast cancer Mother    Colon cancer Neg Hx    Esophageal cancer Neg Hx    Stomach cancer Neg Hx    Rectal cancer Neg Hx     Current Outpatient Medications  (Endocrine & Metabolic):    SYNTHROID 75 MCG tablet,   Current Outpatient Medications (Cardiovascular):    atorvastatin (LIPITOR) 20 MG tablet, Take 20 mg by mouth at bedtime.    carvedilol (COREG) 6.25 MG tablet, Take 6.25 mg by mouth 2 (two) times daily with a meal.    furosemide (LASIX) 40 MG tablet, Take 40 mg by mouth daily before supper.  Current Outpatient Medications (Respiratory):    loratadine (CLARITIN) 10 MG tablet, Take 10 mg by mouth daily as needed for allergies.  Current Outpatient Medications (Analgesics):    HYDROmorphone (DILAUDID) 2 MG tablet,    methadone (DOLOPHINE) 5 MG tablet, Take 5 mg by mouth every 8 (eight) hours.    ASPIRIN PO*, Take 81 mg by mouth daily. (Patient not taking: Reported on 05/07/2022)  Current Outpatient Medications (Hematological):    ASPIRIN PO*, Take 81 mg by mouth daily. (Patient not taking: Reported on 05/07/2022)  Current Outpatient Medications (Other):    Ascorbic Acid (VITAMIN C PO), Take 1,000 mg by mouth daily.   BIOTIN PO, Take 5,000 mg by mouth 2 (two) times a week.   Calcium-Magnesium-Vitamin D (CALCIUM 1200+D3 PO), Take 1 tablet by mouth 2 (two) times daily.   Flaxseed, Linseed, (FLAX SEEDS PO), Take 2,000 mg by mouth daily.   gabapentin (NEURONTIN) 300 MG capsule, Take 2 capsules by mouth 3 (three) times daily.   Multiple Vitamins-Minerals (MULTIVITAMIN PO), Take by mouth daily.   Multiple Vitamins-Minerals (PRESERVISION AREDS PO), Take by mouth.   nystatin-triamcinolone ointment (MYCOLOG), Apply topically twice daily for up to 5 days.   senna (SENOKOT)  8.6 MG tablet, Take 2-3 tablets by mouth 2 (two) times daily. Take two tablets in the morning and three tablets in the evening.   tiZANidine (ZANAFLEX) 4 MG tablet, Take 1 tablet by mouth 3 (three) times daily as needed. * These medications belong to multiple therapeutic classes and are listed under each applicable group.    Review of Systems:  No headache, visual changes,  nausea, vomiting, diarrhea, constipation, dizziness, abdominal pain, skin rash, fevers, chills, night sweats, weight loss, swollen lymph nodes,  chest pain, shortness of breath, mood changes. POSITIVE muscle aches, joint and body pains  Objective  Blood pressure (!) 108/58, pulse 72, height '5\' 1"'$  (1.549 m), weight 198 lb (89.8 kg), last menstrual period 12/06/2000, SpO2 95 %.   General: No apparent distress alert and oriented x3 mood and affect normal, dressed appropriately.  HEENT: Pupils equal, extraocular movements intact  Respiratory: Patient's speak in full sentences and does not appear short of breath  Cardiovascular: No lower extremity edema, non tender, no erythema  Patient does ambulate with the aid of a cane.  Patient does have significant loss of lordosis of the lumbar spine.  Patient has limited range of motion.  Is sitting up comfortably in the chair today.  Neurovascular intact in the extremities.    Impression and Recommendations:    The above documentation has been reviewed and is accurate and complete Lyndal Pulley, DO

## 2022-07-06 ENCOUNTER — Ambulatory Visit (HOSPITAL_BASED_OUTPATIENT_CLINIC_OR_DEPARTMENT_OTHER): Payer: Medicare Other | Admitting: Physical Therapy

## 2022-07-06 ENCOUNTER — Encounter: Payer: Self-pay | Admitting: Family Medicine

## 2022-07-06 ENCOUNTER — Ambulatory Visit (INDEPENDENT_AMBULATORY_CARE_PROVIDER_SITE_OTHER): Payer: Medicare Other | Admitting: Family Medicine

## 2022-07-06 DIAGNOSIS — H353221 Exudative age-related macular degeneration, left eye, with active choroidal neovascularization: Secondary | ICD-10-CM | POA: Diagnosis not present

## 2022-07-06 DIAGNOSIS — M5442 Lumbago with sciatica, left side: Secondary | ICD-10-CM | POA: Diagnosis not present

## 2022-07-06 DIAGNOSIS — H43813 Vitreous degeneration, bilateral: Secondary | ICD-10-CM | POA: Diagnosis not present

## 2022-07-06 DIAGNOSIS — G8929 Other chronic pain: Secondary | ICD-10-CM

## 2022-07-06 DIAGNOSIS — M5441 Lumbago with sciatica, right side: Secondary | ICD-10-CM | POA: Diagnosis not present

## 2022-07-06 DIAGNOSIS — H35433 Paving stone degeneration of retina, bilateral: Secondary | ICD-10-CM | POA: Diagnosis not present

## 2022-07-06 DIAGNOSIS — H353212 Exudative age-related macular degeneration, right eye, with inactive choroidal neovascularization: Secondary | ICD-10-CM | POA: Diagnosis not present

## 2022-07-06 NOTE — Assessment & Plan Note (Signed)
Known ASD.  Does have loss of lordosis.  Did well with aquatic therapy Patient continues to have some tightness and back pain.  Discussed the possibility of Cymbalta.  Given some information about it.  Patient will consider this as well.  Discussed icing regimen and home exercises otherwise.  Follow-up again in 3 to 4 months.

## 2022-07-06 NOTE — Patient Instructions (Addendum)
Good to see you Look up [name] [name] See me in 3-4 months for back pain

## 2022-07-16 ENCOUNTER — Encounter (HOSPITAL_BASED_OUTPATIENT_CLINIC_OR_DEPARTMENT_OTHER): Payer: Self-pay | Admitting: Physical Therapy

## 2022-07-16 ENCOUNTER — Ambulatory Visit (HOSPITAL_BASED_OUTPATIENT_CLINIC_OR_DEPARTMENT_OTHER): Payer: Medicare Other | Attending: Family Medicine | Admitting: Physical Therapy

## 2022-07-16 DIAGNOSIS — M5459 Other low back pain: Secondary | ICD-10-CM | POA: Diagnosis present

## 2022-07-16 DIAGNOSIS — R262 Difficulty in walking, not elsewhere classified: Secondary | ICD-10-CM | POA: Diagnosis present

## 2022-07-16 DIAGNOSIS — M6281 Muscle weakness (generalized): Secondary | ICD-10-CM | POA: Insufficient documentation

## 2022-07-16 NOTE — Therapy (Signed)
OUTPATIENT PHYSICAL THERAPY THORACOLUMBAR TREATMENT   Patient Name: [name] [name] MRN: [id number] DOB:[birth year], 81 y.o., female Today's Date: 07/16/2022   PT End of Session - 07/16/22 0907     Visit Number 15    Number of Visits 22    Date for PT Re-Evaluation 07/29/22    Authorization Type Medicare A and B    PT Start Time 0903    PT Stop Time 0941    PT Time Calculation (min) 38 min    Activity Tolerance Patient tolerated treatment well    Behavior During Therapy WFL for tasks assessed/performed                  Past Medical History:  Diagnosis Date   Acquired left flat foot    Acquired leg length discrepancy    Allergic urticaria    Allergy    Anemia    took iron for a while   Arthritis    lower back, knees   Blood transfusion without reported diagnosis    Bronchitis    history only   Cataract    Chronic fatigue syndrome    Chronic pain    lower back   Contusion of left hand    Female bladder prolapse    Fracture of metatarsal bone    w nonunion    Gout    Hallux rigidus    History of measles    History of mumps    History of rubella    Hyperlipidemia    Hypertension    Hypothyroidism    Imbalance    Macular degeneration    wet- right eye  dry- left eye   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[place])    Metatarsalgia of left foot    Missed ab    x 6 - 2 surgery, 4 resolved on it's own   Neuromuscular disorder ([place])    , left foot has some numbeness- uses cane   Numbness    outer edge of heel left foot    Osteopenia    Pneumonia    PONV (postoperative nausea and vomiting)    states she had scop patch in past,states she sets off alarms when she is waking up, she "forgets to breathe"    Seasonal allergies    Shingles    Tinnitus    Trigger middle finger of left hand    Ulcer    hx   Urinary incontinence    Past Surgical History:  Procedure Laterality Date   ABDOMINAL HYSTERECTOMY Bilateral 10/07/2014   Procedure: TOTAL ABDOMINAL  HYSTERECTOMY WITH BILATERAL SALPINGO OOPHORECTOMY AND HALBANS CULDOELASTY ;  Surgeon: Lyman Speller, MD;  Location: [facility];  Service: Gynecology;  Laterality: Bilateral;  Miller primary/Silva assist  4 hours total   ANTERIOR AND POSTERIOR REPAIR N/A 10/07/2014   Procedure: ANTERIOR (CYSTOCELE) AND POSTERIOR REPAIR (RECTOCELE);  Surgeon: Lyman Speller, MD;  Location: [facility];  Service: Gynecology;  Laterality: N/A;   APPENDECTOMY     BACK SURGERY  09,00,10,99, 6/14,9/14   x6 surgeries on back -Spinal Fusin L4-L5, Lumbar Fusion L1-S1   BLADDER SUSPENSION N/A 10/07/2014   Procedure: TRANSVAGINAL TAPE (TVT) EXACT PROCEDURE;  Surgeon: Lyman Speller, MD;  Location: [facility];  Service: Gynecology;  Laterality: N/A;   BREAST EXCISIONAL BIOPSY Left 1998   benign   BREAST SURGERY     left cyst- benign   BUNIONECTOMY  03/02/11 and 11/04/11   left foot x2   CARPAL TUNNEL RELEASE  both   CATARACT EXTRACTION Left 08/03/2019   CATARACT EXTRACTION Right 08/03/2019   CYSTOSCOPY N/A 10/07/2014   Procedure: CYSTOSCOPY;  Surgeon: Lyman Speller, MD;  Location: [facility];  Service: Gynecology;  Laterality: N/A;   DILATION AND CURETTAGE OF UTERUS     x2   Eye injections Left    HAMMER TOE SURGERY  4/12   lt foot-2-3   I&D and spina abscess L4-5     JOINT REPLACEMENT     R- TKA- 2013   KNEE ARTHROSCOPY  2007   both   LUMBAR LAMINECTOMY  05/1999   L4-L5   METATARSAL OSTEOTOMY  3/12   lt   ORIF TOE FRACTURE  11/04/2011   Procedure: OPEN REDUCTION INTERNAL FIXATION (ORIF) METATARSAL (TOE) FRACTURE;  Surgeon: Wylene Simmer, MD;  Location: [facility];  Service: Orthopedics;  Laterality: Left;  left revision orif 1st metatarsal with autograft from left calcaneous and left 2nd-3rd metatarsal weil osteotomy   osteoma  12   forehead x2   OTHER SURGICAL HISTORY  05/08/13   hardware removed from left foot   removal of two osteomas     on forehead-2008   STERIOD INJECTION Left  11/04/2015   Procedure: STEROID INJECTION;  Surgeon: Gaynelle Arabian, MD;  Location: [facility];  Service: Orthopedics;  Laterality: Left;   THUMB FUSION  2008   rt   TOTAL KNEE ARTHROPLASTY  08/29/2012   Procedure: TOTAL KNEE ARTHROPLASTY;  Surgeon: Hessie Dibble, MD;  Location: [facility];  Service: Orthopedics;  Laterality: Right;   TOTAL KNEE ARTHROPLASTY Left 05/17/2016   Procedure: LEFT TOTAL KNEE ARTHROPLASTY;  Surgeon: Gaynelle Arabian, MD;  Location: [facility];  Service: Orthopedics;  Laterality: Left;   TOTAL KNEE ARTHROPLASTY Right    TOTAL KNEE REVISION Right 11/04/2015   Procedure: TOTAL KNEE ARTHROPLASTY REVISION;  Surgeon: Gaynelle Arabian, MD;  Location: [facility];  Service: Orthopedics;  Laterality: Right;   TOTAL SHOULDER ARTHROPLASTY Right 05/09/2014   Procedure: TOTAL SHOULDER ARTHROPLASTY;  Surgeon: Nita Sells, MD;  Location: [facility];  Service: Orthopedics;  Laterality: Right;  Right shoulder arthroplasty   TUBAL LIGATION     Patient Active Problem List   Diagnosis Date Noted   Chronic low back pain 03/04/2022   Chronic kidney disease, stage 3b ([place]) 02/03/2022   History of total knee replacement, right 03/05/2018   Hypokalemia 05/19/2016   Sinus tachycardia 05/19/2016   OA (osteoarthritis) of knee 05/17/2016   Failed total right knee replacement ([place]) 11/04/2015   Failed total knee, right ([place]) 11/04/2015   S/P TAH (total abdominal hysterectomy) 10/08/2014   Pelvic prolapse 10/07/2014   Cystocele 08/16/2014   Glenohumeral arthritis 05/09/2014   Right knee DJD 08/29/2012    Class: Chronic    PCP: Tonna Boehringer, MD  REFERRING PROVIDER: Lyndal Pulley, DO   REFERRING DIAG: M54.42,M54.41,G89.29 (ICD-10-CM) - Chronic bilateral low back pain with bilateral sciatica   Rationale for Evaluation and Treatment Rehabilitation  THERAPY DIAG:  Other low back pain  Muscle weakness (generalized)  Difficulty in walking, not elsewhere classified  ONSET DATE: Chronic pain  ; MD visit/order 05/04/2022   SUBJECTIVE:  SUBJECTIVE STATEMENT: Pt reports she did some cleaning her in big shower and since then she has had more numbness in bilat feet. She has not been in pool since last visit. She'd like to revisit the hip hike exercise she did last visit with Tharon Aquas.  She left message with orthotist regarding new orthotics, but hasn't heard back.    PAIN:  Are you having pain? Yes NPRS:  Current: 4/10 Location:  central lower lumbar  Type: constant; aching; can be sharp at times  PERTINENT HISTORY:  -LATEX ALLERGY -Chronic back pain with x ray findings of significant DDD at T12-L1 and mild degenerative changes at T10-T11 and T11-T12 -Chronic fatigue syndrome, osteopenia, Cervical spondylosis, and Macular degeneratio -PSHx:  6 lumbar surgeries with L1-S1 fusion in 2014, bilat TKA, R TSA    PRECAUTIONS: Other: L1-S1 fusion in 2014, degenerative thoracic changes, osteopenia, bilat TKA, R TSA.  Pt has a LATEX ALLERGY.  WEIGHT BEARING RESTRICTIONS No  FALLS:  Has patient fallen in last 6 months? Yes. Number of falls 1; tripped over a large rock in her yard  LIVING ENVIRONMENT: Lives with: lives with their spouse Lives in: 1 level home Stairs: 4 steps to enter home with 1 rail Has following equipment at home: Single point cane and 3 wheeled walker  OCCUPATION: Pt is retired  PLOF: Independent; Pt has a Hx of lumbar pain. She has been using cane for 5-6 years.  PATIENT GOALS improve strength and balance.  Wants to be more steady on feet. To be able to stand straighter with ambulation   OBJECTIVE:   DIAGNOSTIC FINDINGS:  Xrays  IMPRESSION: 1. Postsurgical changes of prior PLIF with interbody grafts at each level and an interbody cage at L4-L5. No evidence of hardware complication. 2. Interval development of  significant adjacent level degenerative disc disease at T12-L1. 3. More mild adjacent level degenerative changes also visualized at T10-T11 and T11-T12.  IMPRESSION: Exaggerated thoracic kyphosis with adjacent level degenerative disc disease at T12-T11.  Changes of degenerative disc disease noted throughout the majority of the thoracic spine. No evidence of acute fracture.      LUMBAR ROM:    Active  AROM  eval AROM  Flexion WFL   Extension     Right lateral flexion 75% with pain 85 with pain  Left lateral flexion WFL   Right rotation     Left rotation      (Blank rows = not tested)     LOWER EXTREMITY MMT:     MMT Right eval Left eval Right/Left  Hip flexion 4/5 4+/5 5-/5  Hip extension       Hip abduction WFL tested in sitting WFL tested in sitting   Hip adduction       Hip internal rotation       Hip external rotation       Knee flexion 5/5 tested in sitting 5/5 tested in sitting   Knee extension 5/5 5/5   Ankle dorsiflexion 5/5 5/5   Ankle plantarflexion WFL tested in sitting WFL tested in sitting   Ankle inversion       Ankle eversion        (Blank rows = not tested)   LUMBAR SPECIAL TESTS:  Supine SLR test: positive bilat   FUNCTIONAL TESTS:  5x STS:  16 sec without UE's.      06/17/22   5x STS: 13s   TUG: 15s   Berg:38/56  BERG Balance Test  Date: 06/17/22  Sit to Stand 4  Standing unsupported 4  Sitting with back unsupported but feet supported 4  Stand to sit  3  Transfers  3  Standing unsupported with eyes closed 3  Standing unsupported feet together 3  From standing position, reach forward with outstretched arm 3  From standing position, pick up object from floor 3  From standing position, turn and look behind over each shoulder 2  Turn 360 2  Standing unsupported, alternately place foot on step 3  Standing unsupported, one foot in front 1  Standing on one leg 0  Total:  38/56      GAIT: Distance walked: approx 120  ft Assistive device utilized: Single point cane Level of assistance: Complete Independence Comments: Pt ambulates with increased Wb'ing thru cane and has forward flexed posture the more she walks.  Pt had antalgic limp, increased fwd flexion, and lean to the L when ambulating without cane.      TODAY'S TREATMENT  Pt seen for aquatic therapy today.  Treatment took place in water 3.25-4.5 ft in depth at the [facility]. Temp of water was 92.  Pt entered/exited the pool via steps with 2 rails, indep.   Walking forward, Back, and side stepping   [place] x 2 laps, cues for relaxed knees when crossing Tandem gait forwards/ backwards with yellow noodle Hip hiking R/L (glut strengthening) on bottom step, with UE support x10. Cues for level pelvis with eccentric and concentric movements Side stepping with arms abdct/add with yellow hand buoys SLS  with UE supported on 1 foam hand buoys x 25 sec;  progressed to unsupported, each LE Standing DF/PF UE with 1 foam hand buoys x 20. Cues for weight shift/maintaining balance. blue squoodle pull down 2x15: Lumbar rotation x10 between Side to side lunge Return to walking in 4+ ft of water to decompress   Pt requires the buoyancy and hydrostatic pressure of water for support, and to offload joints by unweighting joint load by at least 50 % in navel deep water and by at least 75-80% in chest to neck deep water.  Viscosity of the water is needed for resistance of strengthening. Water current perturbations provides challenge to standing balance requiring increased core activation.   PATIENT EDUCATION:  Education details: Educated pt in exercise form, rationale of exercises.   Person educated: Patient Education method: Explanation Education comprehension: verbalized understanding   HOME EXERCISE PROGRAM: Access Code: [email or web address] URL: [email or web address] Date: 05/26/2022 Prepared by: Ronny Flurry  Exercises - Seated  Scapular Retraction  - 1-2 x daily - 5-6 x weekly - 2 sets - 10 reps - 3-5 seconds hold - Seated Shoulder Row with Anchored Resistance  - 1 x daily - 3-4 x weekly - 2-3 sets - 10 reps - Hooklying Clamshell with Resistance  - 1 x daily - 4 x weekly - 2 sets - 10 reps - Supine Transversus Abdominis Bracing - Hands on Stomach  - 2 x daily - 7 x weekly - 2 sets - 10 reps - Seated Hamstring Stretch  - 2 x daily - 7 x weekly - 2 reps - 20 seconds hold  ASSESSMENT:  CLINICAL IMPRESSION: Pt required cues for more upright posture when pushing hand buoys under water.  When she stopped exercising, she reported pain in low back was a little worse.   Pain decreases when returning to upright walking in chest deep water.  SLS in water is much improved; able to perform with good balance  without use of UE on float/ or skulling. Will plan to teach daughter how to apply ktape to pt's back at next visit as she did report some benefit with in on.  Progressing towards LTGs.  Will continue D/C planning.      OBJECTIVE IMPAIRMENTS Abnormal gait, decreased activity tolerance, decreased balance, decreased endurance, decreased mobility, difficulty walking, decreased ROM, decreased strength, impaired flexibility, postural dysfunction, and pain.   ACTIVITY LIMITATIONS bending, standing, and locomotion level  PARTICIPATION LIMITATIONS: community activity and yard work/gardening  PERSONAL FACTORS Time since onset of injury/illness/exacerbation and 3+ comorbidities: Chronic fatigue syndrome, thoracic degenerative changes, bilat TKA, and multiple lumbar surgeries  are also affecting patient's functional outcome.   REHAB POTENTIAL: Fair due to chronic pain and multiple lumbar surgeries  CLINICAL DECISION MAKING: Evolving/moderate complexity  EVALUATION COMPLEXITY: Moderate   GOALS:  SHORT TERM GOALS:   Pt will tolerate aquatic therapy without adverse effects for improved pain, function, strength, and tolerance to  activity.  Baseline: Goal status: Achieved Target date:  05/21/2022  2.  Pt will report at least a 25% improvement in her posture with ambulation. Baseline:  Goal status: Achieved Target Goal:  06/04/2022  3.  Pt will be independent with land based HEP for improved core strength, pain, and function.  Baseline:  Goal status: Achieved Target Goal:  06/04/2022  4.  Pt will progress with aquatic exercises for improved core strength in order for improved posture with ambulation and standing activities.  Baseline:  Goal status: Achieved Target goal:  06/04/2022   LONG TERM GOALS: Target date:   Pt will report at least a 60% improvement in standing straighter with her daily activities, mobility, and ambulation.  Baseline: 7-13:Pt reports 40% Goal status: INITIAL  2.  Pt will report she is able to ambulate her normal community distance without significant pain.  Baseline:  Goal status: INITIAL  3.  Pt will report improved stability with her daily mobility and ambulation.  Baseline: stability is better but not PLOF Goal status: Achieved  4.  Pt will report at least a 50% improvement in pain with gardening and her normal yard work.  Baseline: 7-13:Pt reports 30% Goal status: Ongoing  5. Pt will improve on Berg balance test to 50/56 or > to demonstrate improved balance decreasing fall risk. Baseline: 38/56 Goal: new     PLAN: PT FREQUENCY: 1-2xw  PT DURATION: 6 weeks  PLANNED INTERVENTIONS: Therapeutic exercises, Therapeutic activity, Neuromuscular re-education, Balance training, Gait training, Patient/Family education, Stair training, Aquatic Therapy, Dry Needling, Electrical stimulation, Cryotherapy, Moist heat, Taping, Manual therapy, and Re-evaluation.  PLAN FOR NEXT SESSION:  Cont with aquatic therapy.  Berg.  Retape and teach daughter how to apply tape.   Kerin Perna, PTA 07/16/22 12:09 PM

## 2022-07-23 ENCOUNTER — Ambulatory Visit (HOSPITAL_BASED_OUTPATIENT_CLINIC_OR_DEPARTMENT_OTHER): Payer: Medicare Other | Admitting: Physical Therapy

## 2022-07-23 ENCOUNTER — Encounter (HOSPITAL_BASED_OUTPATIENT_CLINIC_OR_DEPARTMENT_OTHER): Payer: Self-pay | Admitting: Physical Therapy

## 2022-07-23 DIAGNOSIS — M5459 Other low back pain: Secondary | ICD-10-CM | POA: Diagnosis not present

## 2022-07-23 DIAGNOSIS — R262 Difficulty in walking, not elsewhere classified: Secondary | ICD-10-CM

## 2022-07-23 DIAGNOSIS — M6281 Muscle weakness (generalized): Secondary | ICD-10-CM

## 2022-07-23 NOTE — Therapy (Signed)
OUTPATIENT PHYSICAL THERAPY THORACOLUMBAR TREATMENT   Patient Name: [name] [name] MRN: [id number] DOB:1941/02/04, 81 y.o., female Today's Date: 07/23/2022   PT End of Session - 07/23/22 1041     Visit Number 16    Number of Visits 22    Date for PT Re-Evaluation 07/29/22    Authorization Type Medicare A and B    PT Start Time 6433    PT Stop Time 1115    PT Time Calculation (min) 40 min                  Past Medical History:  Diagnosis Date   Acquired left flat foot    Acquired leg length discrepancy    Allergic urticaria    Allergy    Anemia    took iron for a while   Arthritis    lower back, knees   Blood transfusion without reported diagnosis    Bronchitis    history only   Cataract    Chronic fatigue syndrome    Chronic pain    lower back   Contusion of left hand    Female bladder prolapse    Fracture of metatarsal bone    w nonunion    Gout    Hallux rigidus    History of measles    History of mumps    History of rubella    Hyperlipidemia    Hypertension    Hypothyroidism    Imbalance    Macular degeneration    wet- right eye  dry- left eye   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[place])    Metatarsalgia of left foot    Missed ab    x 6 - 2 surgery, 4 resolved on it's own   Neuromuscular disorder ([place])    , left foot has some numbeness- uses cane   Numbness    outer edge of heel left foot    Osteopenia    Pneumonia    PONV (postoperative nausea and vomiting)    states she had scop patch in past,states she sets off alarms when she is waking up, she "forgets to breathe"    Seasonal allergies    Shingles    Tinnitus    Trigger middle finger of left hand    Ulcer    hx   Urinary incontinence    Past Surgical History:  Procedure Laterality Date   ABDOMINAL HYSTERECTOMY Bilateral 10/07/2014   Procedure: TOTAL ABDOMINAL HYSTERECTOMY WITH BILATERAL SALPINGO OOPHORECTOMY AND HALBANS CULDOELASTY ;  Surgeon: Lyman Speller, MD;  Location:  [facility];  Service: Gynecology;  Laterality: Bilateral;  Miller primary/Silva assist  4 hours total   ANTERIOR AND POSTERIOR REPAIR N/A 10/07/2014   Procedure: ANTERIOR (CYSTOCELE) AND POSTERIOR REPAIR (RECTOCELE);  Surgeon: Lyman Speller, MD;  Location: [facility];  Service: Gynecology;  Laterality: N/A;   APPENDECTOMY     BACK SURGERY  09,00,10,99, 6/14,9/14   x6 surgeries on back -Spinal Fusin L4-L5, Lumbar Fusion L1-S1   BLADDER SUSPENSION N/A 10/07/2014   Procedure: TRANSVAGINAL TAPE (TVT) EXACT PROCEDURE;  Surgeon: Lyman Speller, MD;  Location: [facility];  Service: Gynecology;  Laterality: N/A;   BREAST EXCISIONAL BIOPSY Left 1998   benign   BREAST SURGERY     left cyst- benign   BUNIONECTOMY  03/02/11 and 11/04/11   left foot x2   CARPAL TUNNEL RELEASE     both   CATARACT EXTRACTION Left 08/03/2019   CATARACT EXTRACTION Right 08/03/2019  CYSTOSCOPY N/A 10/07/2014   Procedure: CYSTOSCOPY;  Surgeon: Lyman Speller, MD;  Location: [facility];  Service: Gynecology;  Laterality: N/A;   DILATION AND CURETTAGE OF UTERUS     x2   Eye injections Left    HAMMER TOE SURGERY  4/12   lt foot-2-3   I&D and spina abscess L4-5     JOINT REPLACEMENT     R- TKA- 2013   KNEE ARTHROSCOPY  2007   both   LUMBAR LAMINECTOMY  05/1999   L4-L5   METATARSAL OSTEOTOMY  3/12   lt   ORIF TOE FRACTURE  11/04/2011   Procedure: OPEN REDUCTION INTERNAL FIXATION (ORIF) METATARSAL (TOE) FRACTURE;  Surgeon: Wylene Simmer, MD;  Location: [facility];  Service: Orthopedics;  Laterality: Left;  left revision orif 1st metatarsal with autograft from left calcaneous and left 2nd-3rd metatarsal weil osteotomy   osteoma  12   forehead x2   OTHER SURGICAL HISTORY  05/08/13   hardware removed from left foot   removal of two osteomas     on forehead-2008   STERIOD INJECTION Left 11/04/2015   Procedure: STEROID INJECTION;  Surgeon: Gaynelle Arabian, MD;  Location: [facility];  Service: Orthopedics;  Laterality:  Left;   THUMB FUSION  2008   rt   TOTAL KNEE ARTHROPLASTY  08/29/2012   Procedure: TOTAL KNEE ARTHROPLASTY;  Surgeon: Hessie Dibble, MD;  Location: [place] [facility];  Service: Orthopedics;  Laterality: Right;   TOTAL KNEE ARTHROPLASTY Left 05/17/2016   Procedure: LEFT TOTAL KNEE ARTHROPLASTY;  Surgeon: Gaynelle Arabian, MD;  Location: [facility];  Service: Orthopedics;  Laterality: Left;   TOTAL KNEE ARTHROPLASTY Right    TOTAL KNEE REVISION Right 11/04/2015   Procedure: TOTAL KNEE ARTHROPLASTY REVISION;  Surgeon: Gaynelle Arabian, MD;  Location: [facility];  Service: Orthopedics;  Laterality: Right;   TOTAL SHOULDER ARTHROPLASTY Right 05/09/2014   Procedure: TOTAL SHOULDER ARTHROPLASTY;  Surgeon: Nita Sells, MD;  Location: [facility];  Service: Orthopedics;  Laterality: Right;  Right shoulder arthroplasty   TUBAL LIGATION     Patient Active Problem List   Diagnosis Date Noted   Chronic low back pain 03/04/2022   Chronic kidney disease, stage 3b ([place]) 02/03/2022   History of total knee replacement, right 03/05/2018   Hypokalemia 05/19/2016   Sinus tachycardia 05/19/2016   OA (osteoarthritis) of knee 05/17/2016   Failed total right knee replacement ([place]) 11/04/2015   Failed total knee, right ([place]) 11/04/2015   S/P TAH (total abdominal hysterectomy) 10/08/2014   Pelvic prolapse 10/07/2014   Cystocele 08/16/2014   Glenohumeral arthritis 05/09/2014   Right knee DJD 08/29/2012    Class: Chronic    PCP: Tonna Boehringer, MD  REFERRING PROVIDER: Lyndal Pulley, DO   REFERRING DIAG: M54.42,M54.41,G89.29 (ICD-10-CM) - Chronic bilateral low back pain with bilateral sciatica   Rationale for Evaluation and Treatment Rehabilitation  THERAPY DIAG:  Other low back pain  Muscle weakness (generalized)  Difficulty in walking, not elsewhere classified  ONSET DATE: Chronic pain ; MD visit/order 05/04/2022   SUBJECTIVE:  SUBJECTIVE STATEMENT: Pt reports she went to [facility] and didn't wear her supportive shoes (they were in car, outside of her reach).  She also fell last night in her bathroom, twisting her back and landing on her knee.    PAIN:  Are you having pain? Yes NPRS:  Current: 5/10 Location:  central lower lumbar  Type: constant; aching; can be sharp at times  PERTINENT HISTORY:  -LATEX ALLERGY -Chronic back pain with x ray findings of significant DDD at T12-L1 and mild degenerative changes at T10-T11 and T11-T12 -Chronic fatigue syndrome, osteopenia, Cervical spondylosis, and Macular degeneratio -PSHx:  6 lumbar surgeries with L1-S1 fusion in 2014, bilat TKA, R TSA    PRECAUTIONS: Other: L1-S1 fusion in 2014, degenerative thoracic changes, osteopenia, bilat TKA, R TSA.  Pt has a LATEX ALLERGY.  WEIGHT BEARING RESTRICTIONS No  FALLS:  Has patient fallen in last 6 months? Yes. Number of falls 1; tripped over a large rock in her yard  LIVING ENVIRONMENT: Lives with: lives with their spouse Lives in: 1 level home Stairs: 4 steps to enter home with 1 rail Has following equipment at home: Single point cane and 3 wheeled walker  OCCUPATION: Pt is retired  PLOF: Independent; Pt has a Hx of lumbar pain. She has been using cane for 5-6 years.  PATIENT GOALS improve strength and balance.  Wants to be more steady on feet. To be able to stand straighter with ambulation   OBJECTIVE:   DIAGNOSTIC FINDINGS:  Xrays  IMPRESSION: 1. Postsurgical changes of prior PLIF with interbody grafts at each level and an interbody cage at L4-L5. No evidence of hardware complication. 2. Interval development of significant adjacent level degenerative disc disease at T12-L1. 3. More mild adjacent level degenerative changes also visualized at T10-T11 and T11-T12.  IMPRESSION: Exaggerated thoracic kyphosis with adjacent level degenerative  disc disease at T12-T11.  Changes of degenerative disc disease noted throughout the majority of the thoracic spine. No evidence of acute fracture.      LUMBAR ROM:    Active  AROM  eval AROM  Flexion WFL   Extension     Right lateral flexion 75% with pain 85 with pain  Left lateral flexion WFL   Right rotation     Left rotation      (Blank rows = not tested)     LOWER EXTREMITY MMT:     MMT Right eval Left eval Right/Left  Hip flexion 4/5 4+/5 5-/5  Hip extension       Hip abduction WFL tested in sitting WFL tested in sitting   Hip adduction       Hip internal rotation       Hip external rotation       Knee flexion 5/5 tested in sitting 5/5 tested in sitting   Knee extension 5/5 5/5   Ankle dorsiflexion 5/5 5/5   Ankle plantarflexion WFL tested in sitting WFL tested in sitting   Ankle inversion       Ankle eversion        (Blank rows = not tested)   LUMBAR SPECIAL TESTS:  Supine SLR test: positive bilat   FUNCTIONAL TESTS:  5x STS:  16 sec without UE's.      06/17/22   5x STS: 13s   TUG: 15s   Berg:38/56  BERG Balance Test          Date: 06/17/22  Sit to Stand 4  Standing unsupported 4  Sitting with back unsupported but feet  supported 4  Stand to sit  3  Transfers  3  Standing unsupported with eyes closed 3  Standing unsupported feet together 3  From standing position, reach forward with outstretched arm 3  From standing position, pick up object from floor 3  From standing position, turn and look behind over each shoulder 2  Turn 360 2  Standing unsupported, alternately place foot on step 3  Standing unsupported, one foot in front 1  Standing on one leg 0  Total:  38/56      GAIT: Distance walked: approx 120 ft Assistive device utilized: Single point cane Level of assistance: Complete Independence Comments: Pt ambulates with increased Wb'ing thru cane and has forward flexed posture the more she walks.  Pt had antalgic limp, increased fwd  flexion, and lean to the L when ambulating without cane.      TODAY'S TREATMENT  Pt seen for aquatic therapy today.  Treatment took place in water 3.25-4.5 ft in depth at the [place]. Temp of water was 92.  Pt entered/exited the pool via steps with 2 rails, indep.   Walking forward, Back, and side stepping   [place] x 2 laps, cues for relaxed knees when crossing Tandem gait forwards/ backwards without support Side stepping with arms abdct/add with rainbow hand buoys SLS without support, skulling hands to steady (challenging on LLE) 3 reps LLE, 2 reps RLE  Tandem stance with hands resting on shoulders (left foot in back more challenging) blue squoodle pull down x15: Lumbar rotation x10  Lateral step ups x 10 for hip strengthening, with light support on rail Return to walking in 4+ ft of water to decompress Warrior 1 lifting noodle off surface towards ceiling x 5 each leg forward R/L hamstring stretch, calf stretch, L stretch x 2 of each   Pt requires the buoyancy and hydrostatic pressure of water for support, and to offload joints by unweighting joint load by at least 50 % in navel deep water and by at least 75-80% in chest to neck deep water.  Viscosity of the water is needed for resistance of strengthening. Water current perturbations provides challenge to standing balance requiring increased core activation.   PATIENT EDUCATION:  Education details: Educated pt in exercise form, rationale of exercises.   Person educated: Patient Education method: Explanation Education comprehension: verbalized understanding   HOME EXERCISE PROGRAM: Access Code: [email or web address] URL: [email or web address] Date: 05/26/2022 Prepared by: Ronny Flurry  Exercises - Seated Scapular Retraction  - 1-2 x daily - 5-6 x weekly - 2 sets - 10 reps - 3-5 seconds hold - Seated Shoulder Row with Anchored Resistance  - 1 x daily - 3-4 x weekly - 2-3 sets - 10 reps - Hooklying  Clamshell with Resistance  - 1 x daily - 4 x weekly - 2 sets - 10 reps - Supine Transversus Abdominis Bracing - Hands on Stomach  - 2 x daily - 7 x weekly - 2 sets - 10 reps - Seated Hamstring Stretch  - 2 x daily - 7 x weekly - 2 reps - 20 seconds hold  ASSESSMENT:  CLINICAL IMPRESSION: Despite fall, pt's pain level only slightly elevated from last session.  No radicular symptoms today and gait pattern at baseline.  Pt reported slight reduction in pain during session. Pt' daughter taught how to apply sensitive skin Rock tape to pt's back; took a video with her phone as therapist applied tape.  Progressing towards remaining LTGs.  Pt verbalized readiness to d/c after  next visit.   OBJECTIVE IMPAIRMENTS Abnormal gait, decreased activity tolerance, decreased balance, decreased endurance, decreased mobility, difficulty walking, decreased ROM, decreased strength, impaired flexibility, postural dysfunction, and pain.   ACTIVITY LIMITATIONS bending, standing, and locomotion level  PARTICIPATION LIMITATIONS: community activity and yard work/gardening  PERSONAL FACTORS Time since onset of injury/illness/exacerbation and 3+ comorbidities: Chronic fatigue syndrome, thoracic degenerative changes, bilat TKA, and multiple lumbar surgeries  are also affecting patient's functional outcome.   REHAB POTENTIAL: Fair due to chronic pain and multiple lumbar surgeries  CLINICAL DECISION MAKING: Evolving/moderate complexity  EVALUATION COMPLEXITY: Moderate   GOALS:  SHORT TERM GOALS:   Pt will tolerate aquatic therapy without adverse effects for improved pain, function, strength, and tolerance to activity.  Baseline: Goal status: Achieved Target date:  05/21/2022  2.  Pt will report at least a 25% improvement in her posture with ambulation. Baseline:  Goal status: Achieved Target Goal:  06/04/2022  3.  Pt will be independent with land based HEP for improved core strength, pain, and function.   Baseline:  Goal status: Achieved Target Goal:  06/04/2022  4.  Pt will progress with aquatic exercises for improved core strength in order for improved posture with ambulation and standing activities.  Baseline:  Goal status: Achieved Target goal:  06/04/2022   LONG TERM GOALS: Target date:   Pt will report at least a 60% improvement in standing straighter with her daily activities, mobility, and ambulation.  Baseline: 7-13:Pt reports 40% Goal status: INITIAL  2.  Pt will report she is able to ambulate her normal community distance without significant pain.  Baseline:  Goal status: INITIAL  3.  Pt will report improved stability with her daily mobility and ambulation.  Baseline: stability is better but not PLOF Goal status: Achieved  4.  Pt will report at least a 50% improvement in pain with gardening and her normal yard work.  Baseline: 7-13:Pt reports 30% Goal status: Ongoing  5. Pt will improve on Berg balance test to 50/56 or > to demonstrate improved balance decreasing fall risk. Baseline: 38/56 Goal: new     PLAN: PT FREQUENCY: 1-2xw  PT DURATION: 6 weeks  PLANNED INTERVENTIONS: Therapeutic exercises, Therapeutic activity, Neuromuscular re-education, Balance training, Gait training, Patient/Family education, Stair training, Aquatic Therapy, Dry Needling, Electrical stimulation, Cryotherapy, Moist heat, Taping, Manual therapy, and Re-evaluation.  PLAN FOR NEXT SESSION:  Assess goals including BERG.  D/C.  Issue HEP.    Kerin Perna, PTA 07/23/22 12:46 PM

## 2022-07-30 ENCOUNTER — Encounter (HOSPITAL_BASED_OUTPATIENT_CLINIC_OR_DEPARTMENT_OTHER): Payer: Self-pay | Admitting: Physical Therapy

## 2022-07-30 ENCOUNTER — Ambulatory Visit (HOSPITAL_BASED_OUTPATIENT_CLINIC_OR_DEPARTMENT_OTHER): Payer: Medicare Other | Admitting: Physical Therapy

## 2022-07-30 DIAGNOSIS — M5459 Other low back pain: Secondary | ICD-10-CM

## 2022-07-30 DIAGNOSIS — M6281 Muscle weakness (generalized): Secondary | ICD-10-CM

## 2022-07-30 DIAGNOSIS — G894 Chronic pain syndrome: Secondary | ICD-10-CM | POA: Diagnosis not present

## 2022-07-30 DIAGNOSIS — Z79891 Long term (current) use of opiate analgesic: Secondary | ICD-10-CM | POA: Diagnosis not present

## 2022-07-30 DIAGNOSIS — M47818 Spondylosis without myelopathy or radiculopathy, sacral and sacrococcygeal region: Secondary | ICD-10-CM | POA: Diagnosis not present

## 2022-07-30 DIAGNOSIS — M961 Postlaminectomy syndrome, not elsewhere classified: Secondary | ICD-10-CM | POA: Diagnosis not present

## 2022-07-30 DIAGNOSIS — R262 Difficulty in walking, not elsewhere classified: Secondary | ICD-10-CM

## 2022-07-30 NOTE — Therapy (Addendum)
OUTPATIENT PHYSICAL THERAPY THORACOLUMBAR TREATMENT   Patient Name: [name] [name] MRN: [id number] DOB:January 03, 1941, 81 y.o., female Today's Date: 07/30/2022   PT End of Session - 07/30/22 1035     Visit Number 17    Number of Visits 22    Date for PT Re-Evaluation 07/29/22    Authorization Type Medicare A and B    PT Start Time 1033    PT Stop Time 1112    PT Time Calculation (min) 39 min                  Past Medical History:  Diagnosis Date   Acquired left flat foot    Acquired leg length discrepancy    Allergic urticaria    Allergy    Anemia    took iron for a while   Arthritis    lower back, knees   Blood transfusion without reported diagnosis    Bronchitis    history only   Cataract    Chronic fatigue syndrome    Chronic pain    lower back   Contusion of left hand    Female bladder prolapse    Fracture of metatarsal bone    w nonunion    Gout    Hallux rigidus    History of measles    History of mumps    History of rubella    Hyperlipidemia    Hypertension    Hypothyroidism    Imbalance    Macular degeneration    wet- right eye  dry- left eye   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[place])    Metatarsalgia of left foot    Missed ab    x 6 - 2 surgery, 4 resolved on it's own   Neuromuscular disorder ([place])    , left foot has some numbeness- uses cane   Numbness    outer edge of heel left foot    Osteopenia    Pneumonia    PONV (postoperative nausea and vomiting)    states she had scop patch in past,states she sets off alarms when she is waking up, she "forgets to breathe"    Seasonal allergies    Shingles    Tinnitus    Trigger middle finger of left hand    Ulcer    hx   Urinary incontinence    Past Surgical History:  Procedure Laterality Date   ABDOMINAL HYSTERECTOMY Bilateral 10/07/2014   Procedure: TOTAL ABDOMINAL HYSTERECTOMY WITH BILATERAL SALPINGO OOPHORECTOMY AND HALBANS CULDOELASTY ;  Surgeon: Lyman Speller, MD;  Location:  [facility];  Service: Gynecology;  Laterality: Bilateral;  Miller primary/Silva assist  4 hours total   ANTERIOR AND POSTERIOR REPAIR N/A 10/07/2014   Procedure: ANTERIOR (CYSTOCELE) AND POSTERIOR REPAIR (RECTOCELE);  Surgeon: Lyman Speller, MD;  Location: [facility];  Service: Gynecology;  Laterality: N/A;   APPENDECTOMY     BACK SURGERY  09,00,10,99, 6/14,9/14   x6 surgeries on back -Spinal Fusin L4-L5, Lumbar Fusion L1-S1   BLADDER SUSPENSION N/A 10/07/2014   Procedure: TRANSVAGINAL TAPE (TVT) EXACT PROCEDURE;  Surgeon: Lyman Speller, MD;  Location: [facility];  Service: Gynecology;  Laterality: N/A;   BREAST EXCISIONAL BIOPSY Left 1998   benign   BREAST SURGERY     left cyst- benign   BUNIONECTOMY  03/02/11 and 11/04/11   left foot x2   CARPAL TUNNEL RELEASE     both   CATARACT EXTRACTION Left 08/03/2019   CATARACT EXTRACTION Right 08/03/2019  CYSTOSCOPY N/A 10/07/2014   Procedure: CYSTOSCOPY;  Surgeon: Lyman Speller, MD;  Location: [facility];  Service: Gynecology;  Laterality: N/A;   DILATION AND CURETTAGE OF UTERUS     x2   Eye injections Left    HAMMER TOE SURGERY  4/12   lt foot-2-3   I&D and spina abscess L4-5     JOINT REPLACEMENT     R- TKA- 2013   KNEE ARTHROSCOPY  2007   both   LUMBAR LAMINECTOMY  05/1999   L4-L5   METATARSAL OSTEOTOMY  3/12   lt   ORIF TOE FRACTURE  11/04/2011   Procedure: OPEN REDUCTION INTERNAL FIXATION (ORIF) METATARSAL (TOE) FRACTURE;  Surgeon: Wylene Simmer, MD;  Location: [facility];  Service: Orthopedics;  Laterality: Left;  left revision orif 1st metatarsal with autograft from left calcaneous and left 2nd-3rd metatarsal weil osteotomy   osteoma  12   forehead x2   OTHER SURGICAL HISTORY  05/08/13   hardware removed from left foot   removal of two osteomas     on forehead-2008   STERIOD INJECTION Left 11/04/2015   Procedure: STEROID INJECTION;  Surgeon: Gaynelle Arabian, MD;  Location: [facility];  Service: Orthopedics;  Laterality:  Left;   THUMB FUSION  2008   rt   TOTAL KNEE ARTHROPLASTY  08/29/2012   Procedure: TOTAL KNEE ARTHROPLASTY;  Surgeon: Hessie Dibble, MD;  Location: [facility];  Service: Orthopedics;  Laterality: Right;   TOTAL KNEE ARTHROPLASTY Left 05/17/2016   Procedure: LEFT TOTAL KNEE ARTHROPLASTY;  Surgeon: Gaynelle Arabian, MD;  Location: [facility];  Service: Orthopedics;  Laterality: Left;   TOTAL KNEE ARTHROPLASTY Right    TOTAL KNEE REVISION Right 11/04/2015   Procedure: TOTAL KNEE ARTHROPLASTY REVISION;  Surgeon: Gaynelle Arabian, MD;  Location: [facility];  Service: Orthopedics;  Laterality: Right;   TOTAL SHOULDER ARTHROPLASTY Right 05/09/2014   Procedure: TOTAL SHOULDER ARTHROPLASTY;  Surgeon: Nita Sells, MD;  Location: [facility];  Service: Orthopedics;  Laterality: Right;  Right shoulder arthroplasty   TUBAL LIGATION     Patient Active Problem List   Diagnosis Date Noted   Chronic low back pain 03/04/2022   Chronic kidney disease, stage 3b ([place]) 02/03/2022   History of total knee replacement, right 03/05/2018   Hypokalemia 05/19/2016   Sinus tachycardia 05/19/2016   OA (osteoarthritis) of knee 05/17/2016   Failed total right knee replacement ([place]) 11/04/2015   Failed total knee, right ([place]) 11/04/2015   S/P TAH (total abdominal hysterectomy) 10/08/2014   Pelvic prolapse 10/07/2014   Cystocele 08/16/2014   Glenohumeral arthritis 05/09/2014   Right knee DJD 08/29/2012    Class: Chronic    PCP: Tonna Boehringer, MD  REFERRING PROVIDER: Lyndal Pulley, DO   REFERRING DIAG: M54.42,M54.41,G89.29 (ICD-10-CM) - Chronic bilateral low back pain with bilateral sciatica   Rationale for Evaluation and Treatment Rehabilitation  THERAPY DIAG:  Other low back pain  Muscle weakness (generalized)  Difficulty in walking, not elsewhere classified  ONSET DATE: Chronic pain ; MD visit/order 05/04/2022   SUBJECTIVE:  SUBJECTIVE STATEMENT: Pt reports she has had a stressful week; pain level is higher today.    PAIN:  Are you having pain? Yes NPRS:  Current: 7/10 Location:  central lower lumbar  Type: constant; aching; can be sharp at times  PERTINENT HISTORY:  -LATEX ALLERGY -Chronic back pain with x ray findings of significant DDD at T12-L1 and mild degenerative changes at T10-T11 and T11-T12 -Chronic fatigue syndrome, osteopenia, Cervical spondylosis, and Macular degeneratio -PSHx:  6 lumbar surgeries with L1-S1 fusion in 2014, bilat TKA, R TSA    PRECAUTIONS: Other: L1-S1 fusion in 2014, degenerative thoracic changes, osteopenia, bilat TKA, R TSA.  Pt has a LATEX ALLERGY.  WEIGHT BEARING RESTRICTIONS No  FALLS:  Has patient fallen in last 6 months? Yes. Number of falls 1; tripped over a large rock in her yard  LIVING ENVIRONMENT: Lives with: lives with their spouse Lives in: 1 level home Stairs: 4 steps to enter home with 1 rail Has following equipment at home: Single point cane and 3 wheeled walker  OCCUPATION: Pt is retired  PLOF: Independent; Pt has a Hx of lumbar pain. She has been using cane for 5-6 years.  PATIENT GOALS improve strength and balance.  Wants to be more steady on feet. To be able to stand straighter with ambulation   OBJECTIVE:   DIAGNOSTIC FINDINGS:  Xrays  IMPRESSION: 1. Postsurgical changes of prior PLIF with interbody grafts at each level and an interbody cage at L4-L5. No evidence of hardware complication. 2. Interval development of significant adjacent level degenerative disc disease at T12-L1. 3. More mild adjacent level degenerative changes also visualized at T10-T11 and T11-T12.  IMPRESSION: Exaggerated thoracic kyphosis with adjacent level degenerative disc disease at T12-T11.  Changes of degenerative disc disease noted throughout the majority of the thoracic spine. No evidence of  acute fracture.      LUMBAR ROM:    Active  AROM  eval AROM  Flexion WFL   Extension     Right lateral flexion 75% with pain 85 with pain  Left lateral flexion WFL   Right rotation     Left rotation      (Blank rows = not tested)     LOWER EXTREMITY MMT:     MMT Right eval Left eval Right/Left  Hip flexion 4/5 4+/5 5-/5  Hip extension       Hip abduction WFL tested in sitting WFL tested in sitting   Hip adduction       Hip internal rotation       Hip external rotation       Knee flexion 5/5 tested in sitting 5/5 tested in sitting   Knee extension 5/5 5/5   Ankle dorsiflexion 5/5 5/5   Ankle plantarflexion WFL tested in sitting WFL tested in sitting   Ankle inversion       Ankle eversion        (Blank rows = not tested)   LUMBAR SPECIAL TESTS:  Supine SLR test: positive bilat   FUNCTIONAL TESTS:  5x STS:  16 sec without UE's.      06/17/22   5x STS: 13s   TUG: 15s   Berg:38/56  BERG Balance Test          Date: 06/17/22  Sit to Stand 4 4  Standing unsupported 4 4  Sitting with back unsupported but feet supported 4 4  Stand to sit  3 4  Transfers  3 4  Standing unsupported with eyes closed 3  4  Standing unsupported feet together 3 4  From standing position, reach forward with outstretched arm 3 3  From standing position, pick up object from floor 3 3  From standing position, turn and look behind over each shoulder 2 2  Turn 360 2 4  Standing unsupported, alternately place foot on step 3 4  Standing unsupported, one foot in front 1 1  Standing on one leg 0 0  Total:  38/56 45/56      GAIT: Distance walked: approx 120 ft Assistive device utilized: Single point cane Level of assistance: Complete Independence Comments: Pt ambulates with increased Wb'ing thru cane and has forward flexed posture the more she walks.  Pt had antalgic limp, increased fwd flexion, and lean to the L when ambulating without cane.      TODAY'S TREATMENT  Pt seen for aquatic  therapy today.  Treatment took place in water 3.25-4.5 ft in depth at the [facility]. Temp of water was 92.  Pt entered/exited the pool via steps with 2 rails, indep.   Walking forward, Back, and side stepping without support Side stepping with arm abdct/add with rainbow hand buoys Plank at wall with hip ext L stretch at rails Lateral step ups with rail x 10 each LE Calf stretch with heels off of step R/L hamstring stretch with foot on 2nd step Warrior 1 lifting noodle off surface towards ceiling x 5 each leg forward SLS with UE on blue noodle Tandem gait forwards/ backwards without support Holding yellow hand buoys- SLS hip circles    Pt requires the buoyancy and hydrostatic pressure of water for support, and to offload joints by unweighting joint load by at least 50 % in navel deep water and by at least 75-80% in chest to neck deep water.  Viscosity of the water is needed for resistance of strengthening. Water current perturbations provides challenge to standing balance requiring increased core activation.   PATIENT EDUCATION:  Education details: Educated pt in exercise form, rationale of exercises.   Person educated: Patient Education method: Explanation Education comprehension: verbalized understanding   HOME EXERCISE PROGRAM: Access Code: [email or web address] URL: [email or web address] Date: 07/30/2022 Prepared by: [place]  Exercises - Seated Scapular Retraction  - 1-2 x daily - 5-6 x weekly - 2 sets - 10 reps - 3-5 seconds hold - Seated Shoulder Row with Anchored Resistance  - 1 x daily - 3-4 x weekly - 2-3 sets - 10 reps - Hooklying Clamshell with Resistance  - 1 x daily - 4 x weekly - 2 sets - 10 reps - Supine Transversus Abdominis Bracing - Hands on Stomach  - 2 x daily - 7 x weekly - 2 sets - 10 reps - Seated Hamstring Stretch  - 2 x daily - 7 x weekly - 2 reps - 20 seconds hold Aquatic HEP - Forward Backward Tandem  Walking with Hand Floats  - 1 x daily - 3 x weekly - 3 sets - Single Leg Stance  - 1 x daily - 3 x weekly - 1 sets - 2-3 reps - 15-20 seconds hold - Standing Single Leg Hip Circles  - 1 x daily - 3 x weekly - 1 sets - 10 reps - Warrior I in [employer] with [employer]  - 1 x daily - 3 x weekly - 1 sets - 5 reps - Lateral Step Up  - 1 x daily - 3 x weekly - 3 sets - 10 reps -  Side Stepping with Hand Floats  - 1 x daily - 3 x weekly - 3 sets - Plank with Hip Extension at [employer]  - 1 x daily - 3 x weekly - 1 sets - 10 reps - Standing Hamstring Stretch on Chair  - 1 x daily - 3 x weekly - 1 sets - 2 reps - 20 seconds hold - Low Back Stretch with railing   - 1 x daily - 3 x weekly - 3 sets - 2 reps - 20 seconds hold - Gastroc Stretch on Wall  - 1 x daily - 3 x weekly - 3 sets - 2 reps - 20 seconds hold  ASSESSMENT:  CLINICAL IMPRESSION: Pt had difficulty with Rt lateral step ups; she attributes to soreness from recent fall.  She improved her Berg score from 38 to 45.  Session focused on working through established aquatic HEP; tolerated all exercises well with reduction of pain level to 3/10. She has partially met her goals but is satisfied with her current level of function.  Pt verbalized readiness to d/c after today's visit.   OBJECTIVE IMPAIRMENTS Abnormal gait, decreased activity tolerance, decreased balance, decreased endurance, decreased mobility, difficulty walking, decreased ROM, decreased strength, impaired flexibility, postural dysfunction, and pain.   ACTIVITY LIMITATIONS bending, standing, and locomotion level  PARTICIPATION LIMITATIONS: community activity and yard work/gardening  PERSONAL FACTORS Time since onset of injury/illness/exacerbation and 3+ comorbidities: Chronic fatigue syndrome, thoracic degenerative changes, bilat TKA, and multiple lumbar surgeries  are also affecting patient's functional outcome.   REHAB POTENTIAL: Fair due to chronic pain and multiple lumbar  surgeries  CLINICAL DECISION MAKING: Evolving/moderate complexity  EVALUATION COMPLEXITY: Moderate   GOALS:  SHORT TERM GOALS:   Pt will tolerate aquatic therapy without adverse effects for improved pain, function, strength, and tolerance to activity.  Baseline: Goal status: Achieved Target date:  05/21/2022  2.  Pt will report at least a 25% improvement in her posture with ambulation. Baseline:  Goal status: Achieved Target Goal:  06/04/2022  3.  Pt will be independent with land based HEP for improved core strength, pain, and function.  Baseline:  Goal status: Achieved Target Goal:  06/04/2022  4.  Pt will progress with aquatic exercises for improved core strength in order for improved posture with ambulation and standing activities.  Baseline:  Goal status: Achieved Target goal:  06/04/2022   LONG TERM GOALS: Target date:   Pt will report at least a 60% improvement in standing straighter with her daily activities, mobility, and ambulation.  Baseline: 50% improvement - 07/30/22 Goal status: PARTIALLY MET   2.  Pt will report she is able to ambulate her normal community distance without significant pain.  Baseline:  Goal status: ACHIEVED- 07/30/22  3.  Pt will report improved stability with her daily mobility and ambulation.  Baseline: stability is better but not PLOF Goal status: ACHIEVED  4.  Pt will report at least a 50% improvement in pain with gardening and her normal yard work.  Baseline: 40% improvement - 07/30/22 Goal status: PARTIALLY MET   5. Pt will improve on Berg balance test to 50/56 or > to demonstrate improved balance decreasing fall risk. Baseline: 45/56 - 07/30/22 Goal: NOT MET      PLAN: PT FREQUENCY: 1-2xw  PT DURATION: 6 weeks  PLANNED INTERVENTIONS: Therapeutic exercises, Therapeutic activity, Neuromuscular re-education, Balance training, Gait training, Patient/Family education, Stair training, Aquatic Therapy, Dry Needling, Electrical  stimulation, Cryotherapy, Moist heat, Taping, Manual therapy, and Re-evaluation.  PLAN FOR NEXT SESSION:   D/C today.   Kerin Perna, PTA 07/30/22 11:12 AM  PHYSICAL THERAPY DISCHARGE SUMMARY  Visits from Start of Care: 17  Current functional level related to goals / functional outcomes: See above   Remaining deficits: See above   Education / Equipment: Pt has a HEP.   Patient agrees to discharge. Patient was seen in PT from 05/07/22 - 07/30/22.  Notes indicate patient has partially met goals and is pleased with level of function.  Pt will be considered discharged from skilled PT services.  She will cont with HEP.    Selinda Michaels III PT, DPT 02/04/23 9:22 AM

## 2022-08-10 DIAGNOSIS — M40294 Other kyphosis, thoracic region: Secondary | ICD-10-CM | POA: Diagnosis not present

## 2022-08-10 DIAGNOSIS — M545 Low back pain, unspecified: Secondary | ICD-10-CM | POA: Diagnosis not present

## 2022-08-10 DIAGNOSIS — G8929 Other chronic pain: Secondary | ICD-10-CM | POA: Diagnosis not present

## 2022-08-10 DIAGNOSIS — Z6834 Body mass index (BMI) 34.0-34.9, adult: Secondary | ICD-10-CM | POA: Diagnosis not present

## 2022-08-31 DIAGNOSIS — H353212 Exudative age-related macular degeneration, right eye, with inactive choroidal neovascularization: Secondary | ICD-10-CM | POA: Diagnosis not present

## 2022-08-31 DIAGNOSIS — H353221 Exudative age-related macular degeneration, left eye, with active choroidal neovascularization: Secondary | ICD-10-CM | POA: Diagnosis not present

## 2022-08-31 DIAGNOSIS — H43813 Vitreous degeneration, bilateral: Secondary | ICD-10-CM | POA: Diagnosis not present

## 2022-08-31 DIAGNOSIS — H35433 Paving stone degeneration of retina, bilateral: Secondary | ICD-10-CM | POA: Diagnosis not present

## 2022-09-02 ENCOUNTER — Other Ambulatory Visit: Payer: Self-pay | Admitting: Obstetrics & Gynecology

## 2022-09-17 DIAGNOSIS — Z23 Encounter for immunization: Secondary | ICD-10-CM | POA: Diagnosis not present

## 2022-09-30 DIAGNOSIS — Z23 Encounter for immunization: Secondary | ICD-10-CM | POA: Diagnosis not present

## 2022-10-05 DIAGNOSIS — M961 Postlaminectomy syndrome, not elsewhere classified: Secondary | ICD-10-CM | POA: Diagnosis not present

## 2022-10-05 DIAGNOSIS — Z79891 Long term (current) use of opiate analgesic: Secondary | ICD-10-CM | POA: Diagnosis not present

## 2022-10-05 DIAGNOSIS — M47818 Spondylosis without myelopathy or radiculopathy, sacral and sacrococcygeal region: Secondary | ICD-10-CM | POA: Diagnosis not present

## 2022-10-05 DIAGNOSIS — G894 Chronic pain syndrome: Secondary | ICD-10-CM | POA: Diagnosis not present

## 2022-10-06 NOTE — Progress Notes (Unsigned)
[place] Phone: [phone] Subjective:   [name], am serving as a scribe for Dr. Hulan Saas.  I'm seeing this patient by the request  of:  [name], MD  CC: Chronic low back pain  OIT:[id number]  07/06/2022 Known ASD.  Does have loss of lordosis.  Did well with aquatic therapy Patient continues to have some tightness and back pain.  Discussed the possibility of Cymbalta.  Given some information about it.  Patient will consider this as well.  Discussed icing regimen and home exercises otherwise.  Follow-up again in 3 to 4 months.  Update 10/11/2022 Taffy H Martensen is a 81 y.o. female coming in with complaint of lower back pain.  Patient has been significantly compliant and did do physical therapy all the way through August.  Patient states that her back pain has increased recently. Pain does radiate down the back of L leg to her heel and toes. Moved clothes this weekend and brought out winter clothes and feels this increased her pain. Pain is in scapula and into lumbar spine. Has been doing some exercises at home.   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  Medications patient is taking at the moment is gabapentin 600 mg 3 times a day, Zanaflex 4 mg 3 times a day as needed.     Past Medical History:  Diagnosis Date   Acquired left flat foot    Acquired leg length discrepancy    Allergic urticaria    Allergy    Anemia    took iron for a while   Arthritis    lower back, knees   Blood transfusion without reported diagnosis    Bronchitis    history only   Cataract    Chronic fatigue syndrome    Chronic pain    lower back   Contusion of left hand    Female bladder prolapse    Fracture of metatarsal bone    w  nonunion    Gout    Hallux rigidus    History of measles    History of mumps    History of rubella    Hyperlipidemia    Hypertension    Hypothyroidism    Imbalance    Macular degeneration    wet- right eye  dry- left eye   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[place])    Metatarsalgia of left foot    Missed ab    x 6 - 2 surgery, 4 resolved on it's own   Neuromuscular disorder ([place])    , left foot has some numbeness- uses cane   Numbness    outer edge of heel left foot    Osteopenia    Pneumonia    PONV (postoperative nausea and vomiting)    states she had scop patch in past,states she sets off alarms when she is waking up, she "forgets to breathe"    Seasonal allergies    Shingles    Tinnitus    Trigger middle finger of left hand    Ulcer    hx   Urinary incontinence    Past Surgical History:  Procedure Laterality Date   ABDOMINAL HYSTERECTOMY Bilateral 10/07/2014   Procedure: TOTAL ABDOMINAL HYSTERECTOMY WITH BILATERAL SALPINGO OOPHORECTOMY AND HALBANS CULDOELASTY ;  Surgeon: Lyman Speller, MD;  Location: [facility];  Service: Gynecology;  Laterality: Bilateral;  Miller primary/Silva assist  4 hours total   ANTERIOR AND POSTERIOR REPAIR N/A 10/07/2014   Procedure: ANTERIOR (CYSTOCELE) AND POSTERIOR REPAIR (RECTOCELE);  Surgeon: Lyman Speller, MD;  Location: [facility];  Service: Gynecology;  Laterality: N/A;   APPENDECTOMY     BACK SURGERY  09,00,10,99, 6/14,9/14   x6 surgeries on back -Spinal Fusin L4-L5, Lumbar Fusion L1-S1   BLADDER SUSPENSION N/A 10/07/2014   Procedure: TRANSVAGINAL TAPE (TVT) EXACT PROCEDURE;  Surgeon: Lyman Speller, MD;  Location: [facility];  Service: Gynecology;  Laterality: N/A;   BREAST EXCISIONAL BIOPSY Left 1998   benign   BREAST SURGERY     left cyst- benign   BUNIONECTOMY  03/02/11 and 11/04/11   left foot x2   CARPAL TUNNEL RELEASE     both   CATARACT EXTRACTION Left 08/03/2019   CATARACT EXTRACTION Right 08/03/2019   CYSTOSCOPY  N/A 10/07/2014   Procedure: CYSTOSCOPY;  Surgeon: Lyman Speller, MD;  Location: [facility];  Service: Gynecology;  Laterality: N/A;   DILATION AND CURETTAGE OF UTERUS     x2   Eye injections Left    HAMMER TOE SURGERY  4/12   lt foot-2-3   I&D and spina abscess L4-5     JOINT REPLACEMENT     R- TKA- 2013   KNEE ARTHROSCOPY  2007   both   LUMBAR LAMINECTOMY  05/1999   L4-L5   METATARSAL OSTEOTOMY  3/12   lt   ORIF TOE FRACTURE  11/04/2011   Procedure: OPEN REDUCTION INTERNAL FIXATION (ORIF) METATARSAL (TOE) FRACTURE;  Surgeon: Wylene Simmer, MD;  Location: [facility];  Service: Orthopedics;  Laterality: Left;  left revision orif 1st metatarsal with autograft from left calcaneous and left 2nd-3rd metatarsal weil osteotomy   osteoma  12   forehead x2   OTHER SURGICAL HISTORY  05/08/13   hardware removed from left foot   removal of two osteomas     on forehead-2008   STERIOD INJECTION Left 11/04/2015   Procedure: STEROID INJECTION;  Surgeon: Gaynelle Arabian, MD;  Location: [facility];  Service: Orthopedics;  Laterality: Left;   THUMB FUSION  2008   rt   TOTAL KNEE ARTHROPLASTY  08/29/2012   Procedure: TOTAL KNEE ARTHROPLASTY;  Surgeon: Hessie Dibble, MD;  Location: [facility];  Service: Orthopedics;  Laterality: Right;   TOTAL KNEE ARTHROPLASTY Left 05/17/2016   Procedure: LEFT TOTAL KNEE ARTHROPLASTY;  Surgeon: Gaynelle Arabian, MD;  Location: [facility];  Service: Orthopedics;  Laterality: Left;   TOTAL KNEE ARTHROPLASTY Right    TOTAL KNEE REVISION Right 11/04/2015   Procedure: TOTAL KNEE ARTHROPLASTY REVISION;  Surgeon: Gaynelle Arabian, MD;  Location: [facility];  Service: Orthopedics;  Laterality: Right;   TOTAL SHOULDER ARTHROPLASTY Right 05/09/2014   Procedure: TOTAL SHOULDER ARTHROPLASTY;  Surgeon: Nita Sells, MD;  Location: [facility];  Service: Orthopedics;  Laterality: Right;  Right shoulder arthroplasty   TUBAL LIGATION     Social History   Socioeconomic History   Marital  status: Married    Spouse name: Not on file   Number of children: Not on file   Years of education: Not on file   Highest education level: Not on file  Occupational History   Not on file  Tobacco Use   Smoking status: Never   Smokeless tobacco: Never  Vaping Use   Vaping Use: Never used  Substance  and Sexual Activity   Alcohol use: Yes    Alcohol/week: 1.0 standard drink of alcohol    Types: 1 Standard drinks or equivalent per week    Comment: glass of wine occas   Drug use: No   Sexual activity: Yes    Partners: Male    Birth control/protection: Post-menopausal    Comment: TAH/BSO  Other Topics Concern   Not on file  Social History Narrative   Not on file   Social Determinants of Health   Financial Resource Strain: Not on file  Food Insecurity: Not on file  Transportation Needs: Not on file  Physical Activity: Not on file  Stress: Not on file  Social Connections: Not on file   Allergies  Allergen Reactions   Oxycodone Hives    '10mg'$  pink pill, allergic only to pink pills, if not pink she can take   Flexeril [Cyclobenzaprine] Hives   Red Dye Hives and Swelling   Adhesive [Tape] Other (See Comments)    Blisters - Tolerates paper tape   Ciprofloxacin Hives   Crab [Shellfish Allergy] Swelling and Other (See Comments)    Lip swellig with artificial crab meat   Etodolac    Povidone Iodine Hives, Swelling and Dermatitis    Betadine   Pravastatin Hives and Other (See Comments)    Tolerates atorvastatin   Purell Instant Hand [Homeopathic Products]    Tylenol [Acetaminophen] Hives and Other (See Comments)    Makes her feel funny. Cannot take Tylenol in combination products (Percocet, Vicodin), but can take it alone Tolerates opiates individually except with red dye   Latex Hives and Other (See Comments)    Blisters   Lisinopril Hives   Methocarbamol Hives   Morphine And Related Other (See Comments)    Does not provide any pain relief   Sulfa Antibiotics Hives    Family History  Problem Relation Age of Onset   Hyperlipidemia Father    Hypertension Father    Heart attack Father 41       deceased   Heart attack Brother 37   Breast cancer Mother    Colon cancer Neg Hx    Esophageal cancer Neg Hx    Stomach cancer Neg Hx    Rectal cancer Neg Hx     Current Outpatient Medications (Endocrine & Metabolic):    SYNTHROID 75 MCG tablet,   Current Outpatient Medications (Cardiovascular):    atorvastatin (LIPITOR) 20 MG tablet, Take 20 mg by mouth at bedtime.    carvedilol (COREG) 6.25 MG tablet, Take 6.25 mg by mouth 2 (two) times daily with a meal.    furosemide (LASIX) 40 MG tablet, Take 40 mg by mouth daily before supper.  Current Outpatient Medications (Respiratory):    loratadine (CLARITIN) 10 MG tablet, Take 10 mg by mouth daily as needed for allergies.  Current Outpatient Medications (Analgesics):    HYDROmorphone (DILAUDID) 2 MG tablet,    methadone (DOLOPHINE) 5 MG tablet, Take 5 mg by mouth every 8 (eight) hours.    ASPIRIN PO*, Take 81 mg by mouth daily. (Patient not taking: Reported on 05/07/2022)  Current Outpatient Medications (Hematological):    ASPIRIN PO*, Take 81 mg by mouth daily. (Patient not taking: Reported on 05/07/2022)  Current Outpatient Medications (Other):    Ascorbic Acid (VITAMIN C PO), Take 1,000 mg by mouth daily.   BIOTIN PO, Take 5,000 mg by mouth 2 (two) times a week.   Calcium-Magnesium-Vitamin D (CALCIUM 1200+D3 PO), Take 1 tablet by mouth 2 (  two) times daily.   Flaxseed, Linseed, (FLAX SEEDS PO), Take 2,000 mg by mouth daily.   gabapentin (NEURONTIN) 300 MG capsule, Take 2 capsules by mouth 3 (three) times daily.   Multiple Vitamins-Minerals (MULTIVITAMIN PO), Take by mouth daily.   Multiple Vitamins-Minerals (PRESERVISION AREDS PO), Take by mouth.   nystatin-triamcinolone ointment (MYCOLOG), APPLY TO THE AFFECTED AREA(S) TWICE DAILY FOR UP TO FIVE DAYS   senna (SENOKOT) 8.6 MG tablet, Take 2-3 tablets by  mouth 2 (two) times daily. Take two tablets in the morning and three tablets in the evening.   tiZANidine (ZANAFLEX) 4 MG tablet, Take 1 tablet by mouth 3 (three) times daily as needed. * These medications belong to multiple therapeutic classes and are listed under each applicable group.   Reviewed prior external information including notes and imaging from  primary care provider As well as notes that were available from care everywhere and other healthcare systems.  Past medical history, social, surgical and family history all reviewed in electronic medical record.  No pertanent information unless stated regarding to the chief complaint.   Review of Systems:  No headache, visual changes, nausea, vomiting, diarrhea, constipation, dizziness, abdominal pain, skin rash, fevers, chills, night sweats, weight loss, swollen lymph nodes,, chest pain, shortness of breath, mood changes. POSITIVE muscle aches, joint swelling, body aches  Objective  Blood pressure 120/62, pulse 79, height '5\' 1"'$  (1.549 m), weight 197 lb (89.4 kg), last menstrual period 12/06/2000, SpO2 93 %.   General: No apparent distress alert and oriented x3 mood and affect normal, dressed appropriately.  HEENT: Pupils equal, extraocular movements intact  Respiratory: Patient's speak in full sentences and does not appear short of breath  Cardiovascular: No lower extremity edema, non tender, no erythema  Very slow ambulation noted.  Arthritic changes of multiple joints.  Limited range of motion in all of the back.  Neurovascularly intact distally.    Impression and Recommendations:    The above documentation has been reviewed and is accurate and complete Lyndal Pulley, DO

## 2022-10-11 ENCOUNTER — Ambulatory Visit (INDEPENDENT_AMBULATORY_CARE_PROVIDER_SITE_OTHER): Payer: Medicare Other | Admitting: Family Medicine

## 2022-10-11 DIAGNOSIS — G8929 Other chronic pain: Secondary | ICD-10-CM

## 2022-10-11 DIAGNOSIS — M5441 Lumbago with sciatica, right side: Secondary | ICD-10-CM | POA: Diagnosis not present

## 2022-10-11 DIAGNOSIS — R790 Abnormal level of blood mineral: Secondary | ICD-10-CM | POA: Diagnosis not present

## 2022-10-11 DIAGNOSIS — M5442 Lumbago with sciatica, left side: Secondary | ICD-10-CM | POA: Diagnosis not present

## 2022-10-11 LAB — COMPREHENSIVE METABOLIC PANEL
ALT: 20 U/L (ref 0–35)
AST: 24 U/L (ref 0–37)
Albumin: 4.2 g/dL (ref 3.5–5.2)
Alkaline Phosphatase: 83 U/L (ref 39–117)
BUN: 22 mg/dL (ref 6–23)
CO2: 30 mEq/L (ref 19–32)
Calcium: 9.4 mg/dL (ref 8.4–10.5)
Chloride: 99 mEq/L (ref 96–112)
Creatinine, Ser: 1 mg/dL (ref 0.40–1.20)
GFR: 53.15 mL/min — ABNORMAL LOW (ref >60.00)
Glucose, Bld: 119 mg/dL — ABNORMAL HIGH (ref 70–99)
Potassium: 3.6 mEq/L (ref 3.5–5.1)
Sodium: 139 mEq/L (ref 135–145)
Total Bilirubin: 0.4 mg/dL (ref 0.2–1.2)
Total Protein: 7 g/dL (ref 6.0–8.3)

## 2022-10-11 LAB — CBC WITH DIFFERENTIAL/PLATELET
Basophils Absolute: 0.1 10*3/uL (ref 0.0–0.1)
Basophils Relative: 1 % (ref 0.0–3.0)
Eosinophils Absolute: 0.2 10*3/uL (ref 0.0–0.7)
Eosinophils Relative: 3.3 % (ref 0.0–5.0)
HCT: 34.7 % — ABNORMAL LOW (ref 36.0–46.0)
Hemoglobin: 11.9 g/dL — ABNORMAL LOW (ref 12.0–15.0)
Lymphocytes Relative: 30 % (ref 12.0–46.0)
Lymphs Abs: 1.6 10*3/uL (ref 0.7–4.0)
MCHC: 34.3 g/dL (ref 30.0–36.0)
MCV: 94.7 fl (ref 78.0–100.0)
Monocytes Absolute: 0.5 10*3/uL (ref 0.1–1.0)
Monocytes Relative: 9.2 % (ref 3.0–12.0)
Neutro Abs: 2.9 10*3/uL (ref 1.4–7.7)
Neutrophils Relative %: 56.5 % (ref 43.0–77.0)
Platelets: 193 10*3/uL (ref 150.0–400.0)
RBC: 3.67 Mil/uL — ABNORMAL LOW (ref 3.87–5.11)
RDW: 13.2 % (ref 11.5–15.5)
WBC: 5.2 10*3/uL (ref 4.0–10.5)

## 2022-10-11 LAB — SEDIMENTATION RATE: Sed Rate: 13 mm/hr (ref 0–30)

## 2022-10-11 LAB — FERRITIN: Ferritin: 34.6 ng/mL (ref 10.0–291.0)

## 2022-10-11 LAB — URIC ACID: Uric Acid, Serum: 8 mg/dL — ABNORMAL HIGH (ref 2.4–7.0)

## 2022-10-11 NOTE — Assessment & Plan Note (Signed)
Patient does have some excessive spasms from time to time.  Could be more secondary to the hypocalcemia that has not been potentially worked up.  We will get laboratory work-up to see if that is potentially contributing.  We discussed other labs that could be helpful as well.  Patient is on furosemide that could be contributing.  Also does take some other pain medications for her other things that can potentially cause the difference in some of the calcium levels.  Depending on the findings this could change management but hopefully not.  Follow-up with me again in 3 to 4 months otherwise.  Will refer appropriately if necessary.

## 2022-10-11 NOTE — Assessment & Plan Note (Signed)
Has had chronic low back pain for quite some time.  Has had multiple surgeries on the back.  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  We discussed the potential for formal physical therapy.  Discussed different medications but secondary to patient's pain medications as well as her multiple allergies we will hold at this time.  If necessary we could consider the Cymbalta again.  Follow-up again in 3 to 4 months

## 2022-10-11 NOTE — Patient Instructions (Addendum)
Good to see you I do think it is worth [place] checking some labs We will be in touch about labs See me again in 3-4 months

## 2022-10-12 LAB — PTH, INTACT AND CALCIUM
Calcium: 9.4 mg/dL (ref 8.6–10.4)
PTH: 34 pg/mL (ref 16–77)

## 2022-10-12 LAB — VITAMIN B12: Vitamin B-12: 672 pg/mL (ref 211–911)

## 2022-12-01 DIAGNOSIS — M47818 Spondylosis without myelopathy or radiculopathy, sacral and sacrococcygeal region: Secondary | ICD-10-CM | POA: Diagnosis not present

## 2022-12-01 DIAGNOSIS — M961 Postlaminectomy syndrome, not elsewhere classified: Secondary | ICD-10-CM | POA: Diagnosis not present

## 2022-12-01 DIAGNOSIS — Z79891 Long term (current) use of opiate analgesic: Secondary | ICD-10-CM | POA: Diagnosis not present

## 2022-12-01 DIAGNOSIS — G894 Chronic pain syndrome: Secondary | ICD-10-CM | POA: Diagnosis not present

## 2022-12-10 DIAGNOSIS — H35033 Hypertensive retinopathy, bilateral: Secondary | ICD-10-CM | POA: Diagnosis not present

## 2022-12-10 DIAGNOSIS — H35432 Paving stone degeneration of retina, left eye: Secondary | ICD-10-CM | POA: Diagnosis not present

## 2022-12-10 DIAGNOSIS — H353211 Exudative age-related macular degeneration, right eye, with active choroidal neovascularization: Secondary | ICD-10-CM | POA: Diagnosis not present

## 2022-12-10 DIAGNOSIS — H353212 Exudative age-related macular degeneration, right eye, with inactive choroidal neovascularization: Secondary | ICD-10-CM | POA: Diagnosis not present

## 2022-12-10 DIAGNOSIS — H43813 Vitreous degeneration, bilateral: Secondary | ICD-10-CM | POA: Diagnosis not present

## 2023-01-13 NOTE — Progress Notes (Unsigned)
Zach K[facility] [place] [address] [place] Phone: [phone] Subjective:   IVilma Meckel, am serving as a scribe for Dr. Hulan Saas.  I'm seeing this patient by the request  of:  [name], MD  CC: Pain overall  [id number]  10/11/2022 Has had chronic low back pain for quite some time.  Has had multiple surgeries on the back.  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  We discussed the potential for formal physical therapy.  Discussed different medications but secondary to patient's pain medications as well as her multiple allergies we will hold at this time.  If necessary we could consider the Cymbalta again.  Follow-up again in 3 to 4 months total time reviewing patient's previous surgical notes, imaging and discussing with patient today 32 minute     Patient does have some excessive spasms from time to time. Could be more secondary to the hypocalcemia that has not been potentially worked up. We will get laboratory work-up to see if that is potentially contributing. We discussed other labs that could be helpful as well. Patient is on furosemide that could be contributing. Also does take some other pain medications for her other things that can potentially cause the difference in some of the calcium levels. Depending on the findings this could change management but hopefully not. Follow-up with me again in 3 to 4 months otherwise. Will refer appropriately if necessar   Updated 01/17/2023 Tashawnda Gmerek Keimig is a 82 y.o. female coming in with complaint of LBP. Pain is about the same. Right knee is irritated. She has had it replaced twice.  Patient continues this continues more back pain noted as well.  Does affect daily activities and has pain every day.       Past Medical History:  Diagnosis Date   Acquired left flat foot     Acquired leg length discrepancy    Allergic urticaria    Allergy    Anemia    took iron for a while   Arthritis    lower back, knees   Blood transfusion without reported diagnosis    Bronchitis    history only   Cataract    Chronic fatigue syndrome    Chronic pain    lower back   Contusion of left hand    Female bladder prolapse    Fracture of metatarsal bone    w nonunion    Gout    Hallux rigidus    History of measles    History of mumps    History of rubella    Hyperlipidemia    Hypertension    Hypothyroidism    Imbalance    Macular degeneration    wet- right eye  dry- left eye   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[place])    Metatarsalgia of left foot    Missed ab    x 6 - 2 surgery, 4 resolved on it's own   Neuromuscular disorder ([place])    , left foot has some numbeness- uses cane   Numbness    outer edge of heel left foot    Osteopenia    Pneumonia    PONV (postoperative nausea and vomiting)    states she had scop patch in past,states she sets off alarms when she is waking up, she "forgets to breathe"    Seasonal allergies  Shingles    Tinnitus    Trigger middle finger of left hand    Ulcer    hx   Urinary incontinence    Past Surgical History:  Procedure Laterality Date   ABDOMINAL HYSTERECTOMY Bilateral 10/07/2014   Procedure: TOTAL ABDOMINAL HYSTERECTOMY WITH BILATERAL SALPINGO OOPHORECTOMY AND HALBANS CULDOELASTY ;  Surgeon: Lyman Speller, MD;  Location: [facility];  Service: Gynecology;  Laterality: Bilateral;  Miller primary/Silva assist  4 hours total   ANTERIOR AND POSTERIOR REPAIR N/A 10/07/2014   Procedure: ANTERIOR (CYSTOCELE) AND POSTERIOR REPAIR (RECTOCELE);  Surgeon: Lyman Speller, MD;  Location: [facility];  Service: Gynecology;  Laterality: N/A;   APPENDECTOMY     BACK SURGERY  09,00,10,99, 6/14,9/14   x6 surgeries on back -Spinal Fusin L4-L5, Lumbar Fusion L1-S1   BLADDER SUSPENSION N/A 10/07/2014   Procedure: TRANSVAGINAL TAPE  (TVT) EXACT PROCEDURE;  Surgeon: Lyman Speller, MD;  Location: [facility];  Service: Gynecology;  Laterality: N/A;   BREAST EXCISIONAL BIOPSY Left 1998   benign   BREAST SURGERY     left cyst- benign   BUNIONECTOMY  03/02/11 and 11/04/11   left foot x2   CARPAL TUNNEL RELEASE     both   CATARACT EXTRACTION Left 08/03/2019   CATARACT EXTRACTION Right 08/03/2019   CYSTOSCOPY N/A 10/07/2014   Procedure: CYSTOSCOPY;  Surgeon: Lyman Speller, MD;  Location: [facility];  Service: Gynecology;  Laterality: N/A;   DILATION AND CURETTAGE OF UTERUS     x2   Eye injections Left    HAMMER TOE SURGERY  4/12   lt foot-2-3   I&D and spina abscess L4-5     JOINT REPLACEMENT     R- TKA- 2013   KNEE ARTHROSCOPY  2007   both   LUMBAR LAMINECTOMY  05/1999   L4-L5   METATARSAL OSTEOTOMY  3/12   lt   ORIF TOE FRACTURE  11/04/2011   Procedure: OPEN REDUCTION INTERNAL FIXATION (ORIF) METATARSAL (TOE) FRACTURE;  Surgeon: Wylene Simmer, MD;  Location: [facility];  Service: Orthopedics;  Laterality: Left;  left revision orif 1st metatarsal with autograft from left calcaneous and left 2nd-3rd metatarsal weil osteotomy   osteoma  12   forehead x2   OTHER SURGICAL HISTORY  05/08/13   hardware removed from left foot   removal of two osteomas     on forehead-2008   STERIOD INJECTION Left 11/04/2015   Procedure: STEROID INJECTION;  Surgeon: Gaynelle Arabian, MD;  Location: [facility];  Service: Orthopedics;  Laterality: Left;   THUMB FUSION  2008   rt   TOTAL KNEE ARTHROPLASTY  08/29/2012   Procedure: TOTAL KNEE ARTHROPLASTY;  Surgeon: Hessie Dibble, MD;  Location: [facility];  Service: Orthopedics;  Laterality: Right;   TOTAL KNEE ARTHROPLASTY Left 05/17/2016   Procedure: LEFT TOTAL KNEE ARTHROPLASTY;  Surgeon: Gaynelle Arabian, MD;  Location: [facility];  Service: Orthopedics;  Laterality: Left;   TOTAL KNEE ARTHROPLASTY Right    TOTAL KNEE REVISION Right 11/04/2015   Procedure: TOTAL KNEE ARTHROPLASTY  REVISION;  Surgeon: Gaynelle Arabian, MD;  Location: [facility];  Service: Orthopedics;  Laterality: Right;   TOTAL SHOULDER ARTHROPLASTY Right 05/09/2014   Procedure: TOTAL SHOULDER ARTHROPLASTY;  Surgeon: Nita Sells, MD;  Location: [facility];  Service: Orthopedics;  Laterality: Right;  Right shoulder arthroplasty   TUBAL LIGATION     Social History   Socioeconomic History   Marital status: Married    Spouse name: Not on  file   Number of children: Not on file   Years of education: Not on file   Highest education level: Not on file  Occupational History   Not on file  Tobacco Use   Smoking status: Never   Smokeless tobacco: Never  Vaping Use   Vaping Use: Never used  Substance and Sexual Activity   Alcohol use: Yes    Alcohol/week: 1.0 standard drink of alcohol    Types: 1 Standard drinks or equivalent per week    Comment: glass of wine occas   Drug use: No   Sexual activity: Yes    Partners: Male    Birth control/protection: Post-menopausal    Comment: TAH/BSO  Other Topics Concern   Not on file  Social History Narrative   Not on file   Social Determinants of Health   Financial Resource Strain: Not on file  Food Insecurity: Not on file  Transportation Needs: Not on file  Physical Activity: Not on file  Stress: Not on file  Social Connections: Not on file   Allergies  Allergen Reactions   Oxycodone Hives    103m pink pill, allergic only to pink pills, if not pink she can take   Flexeril [Cyclobenzaprine] Hives   Red Dye Hives and Swelling   Adhesive [Tape] Other (See Comments)    Blisters - Tolerates paper tape   Ciprofloxacin Hives   Crab [Shellfish Allergy] Swelling and Other (See Comments)    Lip swellig with artificial crab meat   Etodolac    Povidone Iodine Hives, Swelling and Dermatitis    Betadine   Pravastatin Hives and Other (See Comments)    Tolerates atorvastatin   Purell Instant Hand [Homeopathic Products]    Tylenol [Acetaminophen] Hives and  Other (See Comments)    Makes her feel funny. Cannot take Tylenol in combination products (Percocet, Vicodin), but can take it alone Tolerates opiates individually except with red dye   Latex Hives and Other (See Comments)    Blisters   Lisinopril Hives   Methocarbamol Hives   Morphine And Related Other (See Comments)    Does not provide any pain relief   Sulfa Antibiotics Hives   Family History  Problem Relation Age of Onset   Hyperlipidemia Father    Hypertension Father    Heart attack Father 580      deceased   Heart attack Brother 668  Breast cancer Mother    Colon cancer Neg Hx    Esophageal cancer Neg Hx    Stomach cancer Neg Hx    Rectal cancer Neg Hx     Current Outpatient Medications (Endocrine & Metabolic):    SYNTHROID 75 MCG tablet,   Current Outpatient Medications (Cardiovascular):    atorvastatin (LIPITOR) 20 MG tablet, Take 20 mg by mouth at bedtime.    carvedilol (COREG) 6.25 MG tablet, Take 6.25 mg by mouth 2 (two) times daily with a meal.    furosemide (LASIX) 40 MG tablet, Take 40 mg by mouth daily before supper.  Current Outpatient Medications (Respiratory):    loratadine (CLARITIN) 10 MG tablet, Take 10 mg by mouth daily as needed for allergies.  Current Outpatient Medications (Analgesics):    allopurinol (ZYLOPRIM) 100 MG tablet, Take 1 tablet (100 mg total) by mouth daily.   ASPIRIN PO*, Take 81 mg by mouth daily. (Patient not taking: Reported on 05/07/2022)   HYDROmorphone (DILAUDID) 2 MG tablet,    methadone (DOLOPHINE) 5 MG tablet, Take 5 mg by mouth  every 8 (eight) hours.   Current Outpatient Medications (Hematological):    ASPIRIN PO*, Take 81 mg by mouth daily. (Patient not taking: Reported on 05/07/2022)  Current Outpatient Medications (Other):    Ascorbic Acid (VITAMIN C PO), Take 1,000 mg by mouth daily.   BIOTIN PO, Take 5,000 mg by mouth 2 (two) times a week.   Calcium-Magnesium-Vitamin D (CALCIUM 1200+D3 PO), Take 1 tablet by mouth 2  (two) times daily.   Flaxseed, Linseed, (FLAX SEEDS PO), Take 2,000 mg by mouth daily.   gabapentin (NEURONTIN) 300 MG capsule, Take 2 capsules by mouth 3 (three) times daily.   Multiple Vitamins-Minerals (MULTIVITAMIN PO), Take by mouth daily.   Multiple Vitamins-Minerals (PRESERVISION AREDS PO), Take by mouth.   nystatin-triamcinolone ointment (MYCOLOG), APPLY TO THE AFFECTED AREA(S) TWICE DAILY FOR UP TO FIVE DAYS   senna (SENOKOT) 8.6 MG tablet, Take 2-3 tablets by mouth 2 (two) times daily. Take two tablets in the morning and three tablets in the evening.   tiZANidine (ZANAFLEX) 4 MG tablet, Take 1 tablet by mouth 3 (three) times daily as needed. * These medications belong to multiple therapeutic classes and are listed under each applicable group.   Reviewed prior external information including notes and imaging from  primary care provider As well as notes that were available from care everywhere and other healthcare systems.  Past medical history, social, surgical and family history all reviewed in electronic medical record.  No pertanent information unless stated regarding to the chief complaint.   Review of Systems:  No headache, visual changes, nausea, vomiting, diarrhea, constipation, dizziness, abdominal pain, skin rash, fevers, chills, night sweats, weight loss, swollen lymph nodes,  joint swelling, chest pain, shortness of breath, mood changes. POSITIVE muscle aches, body aches  Objective  Blood pressure 126/66, pulse 75, height 5' 1"$  (1.549 m), weight 198 lb (89.8 kg), last menstrual period 12/06/2000, SpO2 90 %.   General: No apparent distress alert and oriented x3 mood and affect normal, dressed appropriately.  HEENT: Pupils equal, extraocular movements intact  Respiratory: Patient's speak in full sentences and does not appear short of breath  Patient is sitting relatively comfortable.  Patient's right hip is tender to palpation over the greater trochanteric area.  Low back  does have some loss of lordosis with some degenerative scoliosis.  Neurovascular intact distally.    Impression and Recommendations:     The above documentation has been reviewed and is accurate and complete Lyndal Pulley, DO

## 2023-01-17 ENCOUNTER — Ambulatory Visit (INDEPENDENT_AMBULATORY_CARE_PROVIDER_SITE_OTHER): Payer: Medicare Other | Admitting: Family Medicine

## 2023-01-17 VITALS — BP 126/66 | HR 75 | Ht 61.0 in | Wt 198.0 lb

## 2023-01-17 DIAGNOSIS — M5441 Lumbago with sciatica, right side: Secondary | ICD-10-CM

## 2023-01-17 DIAGNOSIS — G8929 Other chronic pain: Secondary | ICD-10-CM | POA: Diagnosis not present

## 2023-01-17 DIAGNOSIS — M5442 Lumbago with sciatica, left side: Secondary | ICD-10-CM | POA: Diagnosis not present

## 2023-01-17 DIAGNOSIS — E790 Hyperuricemia without signs of inflammatory arthritis and tophaceous disease: Secondary | ICD-10-CM

## 2023-01-17 MED ORDER — ALLOPURINOL 100 MG PO TABS: 100.0000 mg | ORAL_TABLET | Freq: Every day | ORAL | 6 refills | Status: AC

## 2023-01-17 NOTE — Patient Instructions (Signed)
Take allopuirnoal daily Stop if you get a rash See me in 2-3 months

## 2023-01-18 ENCOUNTER — Encounter: Payer: Self-pay | Admitting: Family Medicine

## 2023-01-18 DIAGNOSIS — E790 Hyperuricemia without signs of inflammatory arthritis and tophaceous disease: Secondary | ICD-10-CM | POA: Insufficient documentation

## 2023-01-18 NOTE — Assessment & Plan Note (Signed)
Arthritic changes in multiple areas.  Discussed with patient again at great length of different treatment options.  Patient last time was found to have hypocalcemia that has resolved at the moment and likely secondary to the furosemide.  Patient though does have elevated uric acid which I do think could be contributing to some of the pain and started on allopurinol.  Warned of potential side effects including rash and to discontinue if this does occur.  We discussed with patient that I do think that this will be helpful.  Patient will try the 100 mg but we do have room to increase if necessary.  Follow-up with me again in 2 months.  Total time with patient 32 minutes with review of laboratory results and outside records.

## 2023-01-26 DIAGNOSIS — G894 Chronic pain syndrome: Secondary | ICD-10-CM | POA: Diagnosis not present

## 2023-01-26 DIAGNOSIS — Z79891 Long term (current) use of opiate analgesic: Secondary | ICD-10-CM | POA: Diagnosis not present

## 2023-01-26 DIAGNOSIS — M961 Postlaminectomy syndrome, not elsewhere classified: Secondary | ICD-10-CM | POA: Diagnosis not present

## 2023-01-26 DIAGNOSIS — M47818 Spondylosis without myelopathy or radiculopathy, sacral and sacrococcygeal region: Secondary | ICD-10-CM | POA: Diagnosis not present

## 2023-02-10 ENCOUNTER — Encounter (HOSPITAL_BASED_OUTPATIENT_CLINIC_OR_DEPARTMENT_OTHER): Payer: Self-pay | Admitting: Obstetrics & Gynecology

## 2023-02-10 ENCOUNTER — Ambulatory Visit (INDEPENDENT_AMBULATORY_CARE_PROVIDER_SITE_OTHER): Payer: Medicare Other | Admitting: Obstetrics & Gynecology

## 2023-02-10 VITALS — BP 122/58 | HR 99 | Ht 61.0 in | Wt 197.6 lb

## 2023-02-10 DIAGNOSIS — Z01411 Encounter for gynecological examination (general) (routine) with abnormal findings: Secondary | ICD-10-CM

## 2023-02-10 DIAGNOSIS — Z90710 Acquired absence of both cervix and uterus: Secondary | ICD-10-CM

## 2023-02-10 DIAGNOSIS — N816 Rectocele: Secondary | ICD-10-CM

## 2023-02-10 DIAGNOSIS — Z8679 Personal history of other diseases of the circulatory system: Secondary | ICD-10-CM | POA: Insufficient documentation

## 2023-02-10 DIAGNOSIS — Z803 Family history of malignant neoplasm of breast: Secondary | ICD-10-CM

## 2023-02-10 NOTE — Progress Notes (Signed)
82 y.o. [name] [name]:[id number] Married White or Caucasian female here for breast and pelvic exam.  I am also following her for pelvic relaxation and cystocele.  If careful about voiding, doesn't have to get up night.  Doesn't leak if tried to hold her urine too long.    Denies vaginal bleeding.  Patient's last menstrual period was 12/06/2000.          Sexually active: Yes.    H/O STD:  no  Health Maintenance: PCP:  Dr. Sharlett Iles .  Last wellness appt was last spring.  Did blood work at that appt:  yes Vaccines are up to date:  yes Colonoscopy:  04/30/2015 MMG:  12/09/2021 Negative.  She is going to schedule this.  BMD:  03/08/2022 Osteopenia Last pap smear:  not indicated.   H/o abnormal pap smear:  yes    reports that she has never smoked. She has never used smokeless tobacco. She reports current alcohol use of about 1.0 standard drink of alcohol per week. She reports that she does not use drugs.  Past Medical History:  Diagnosis Date   Acquired left flat foot    Acquired leg length discrepancy    Allergic urticaria    Allergy    Anemia    took iron for a while   Arthritis    lower back, knees   Blood transfusion without reported diagnosis    Cataract    Chronic fatigue syndrome    Chronic pain    lower back   Female bladder prolapse    Fracture of metatarsal bone    w nonunion    Gout    Hallux rigidus    History of measles    History of mumps    History of rubella    Hyperlipidemia    Hypertension    Hypothyroidism    Imbalance    Macular degeneration    wet- right eye  dry- left eye   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[facility])    Metatarsalgia of left foot    Missed ab    x 6 - 2 surgery, 4 resolved on it's own   Neuromuscular disorder ([place])    , left foot has some numbeness- uses cane   Numbness    outer edge of heel left foot    Osteopenia    Pneumonia    PONV (postoperative nausea and vomiting)    states she had scop patch in past,states she sets off alarms when  she is waking up, she "forgets to breathe"    Seasonal allergies    Shingles    Tinnitus    Trigger middle finger of left hand    Ulcer    hx   Urinary incontinence     Past Surgical History:  Procedure Laterality Date   ABDOMINAL HYSTERECTOMY Bilateral 10/07/2014   Procedure: TOTAL ABDOMINAL HYSTERECTOMY WITH BILATERAL SALPINGO OOPHORECTOMY AND HALBANS CULDOELASTY ;  Surgeon: Lyman Speller, MD;  Location: [facility];  Service: Gynecology;  Laterality: Bilateral;  Javonda Suh primary/Silva assist  4 hours total   ANTERIOR AND POSTERIOR REPAIR N/A 10/07/2014   Procedure: ANTERIOR (CYSTOCELE) AND POSTERIOR REPAIR (RECTOCELE);  Surgeon: Lyman Speller, MD;  Location: [facility];  Service: Gynecology;  Laterality: N/A;   APPENDECTOMY     BACK SURGERY  09,00,10,99, 6/14,9/14   x6 surgeries on back -Spinal Fusin L4-L5, Lumbar Fusion L1-S1   BLADDER SUSPENSION N/A 10/07/2014   Procedure: TRANSVAGINAL TAPE (TVT) EXACT PROCEDURE;  Surgeon: Lyman Speller, MD;  Location: [facility];  Service: Gynecology;  Laterality: N/A;   BREAST EXCISIONAL BIOPSY Left 1998   benign   BREAST SURGERY     left cyst- benign   BUNIONECTOMY  03/02/11 and 11/04/11   left foot x2   CARPAL TUNNEL RELEASE     both   CATARACT EXTRACTION Left 08/03/2019   CATARACT EXTRACTION Right 08/03/2019   CYSTOSCOPY N/A 10/07/2014   Procedure: CYSTOSCOPY;  Surgeon: Lyman Speller, MD;  Location: [facility];  Service: Gynecology;  Laterality: N/A;   DILATION AND CURETTAGE OF UTERUS     x2   Eye injections Left    HAMMER TOE SURGERY  4/12   lt foot-2-3   I&D and spina abscess L4-5     JOINT REPLACEMENT     R- TKA- 2013   KNEE ARTHROSCOPY  2007   both   LUMBAR LAMINECTOMY  05/1999   L4-L5   METATARSAL OSTEOTOMY  3/12   lt   ORIF TOE FRACTURE  11/04/2011   Procedure: OPEN REDUCTION INTERNAL FIXATION (ORIF) METATARSAL (TOE) FRACTURE;  Surgeon: Wylene Simmer, MD;  Location: [facility];  Service: Orthopedics;   Laterality: Left;  left revision orif 1st metatarsal with autograft from left calcaneous and left 2nd-3rd metatarsal weil osteotomy   osteoma  12   forehead x2   OTHER SURGICAL HISTORY  05/08/13   hardware removed from left foot   removal of two osteomas     on forehead-2008   STERIOD INJECTION Left 11/04/2015   Procedure: STEROID INJECTION;  Surgeon: Gaynelle Arabian, MD;  Location: [facility];  Service: Orthopedics;  Laterality: Left;   THUMB FUSION  2008   rt   TOTAL KNEE ARTHROPLASTY  08/29/2012   Procedure: TOTAL KNEE ARTHROPLASTY;  Surgeon: Hessie Dibble, MD;  Location: [facility];  Service: Orthopedics;  Laterality: Right;   TOTAL KNEE ARTHROPLASTY Left 05/17/2016   Procedure: LEFT TOTAL KNEE ARTHROPLASTY;  Surgeon: Gaynelle Arabian, MD;  Location: [facility];  Service: Orthopedics;  Laterality: Left;   TOTAL KNEE ARTHROPLASTY Right    TOTAL KNEE REVISION Right 11/04/2015   Procedure: TOTAL KNEE ARTHROPLASTY REVISION;  Surgeon: Gaynelle Arabian, MD;  Location: [facility];  Service: Orthopedics;  Laterality: Right;   TOTAL SHOULDER ARTHROPLASTY Right 05/09/2014   Procedure: TOTAL SHOULDER ARTHROPLASTY;  Surgeon: Nita Sells, MD;  Location: [facility];  Service: Orthopedics;  Laterality: Right;  Right shoulder arthroplasty   TUBAL LIGATION      Current Outpatient Medications  Medication Sig Dispense Refill   allopurinol (ZYLOPRIM) 100 MG tablet Take 1 tablet (100 mg total) by mouth daily. 30 tablet 6   Ascorbic Acid (VITAMIN C PO) Take 1,000 mg by mouth daily.     atorvastatin (LIPITOR) 20 MG tablet Take 20 mg by mouth at bedtime.      BIOTIN PO Take 5,000 mg by mouth 2 (two) times a week.     Calcium-Magnesium-Vitamin D (CALCIUM 1200+D3 PO) Take 1 tablet by mouth 2 (two) times daily.     carvedilol (COREG) 6.25 MG tablet Take 6.25 mg by mouth 2 (two) times daily with a meal.      Flaxseed, Linseed, (FLAX SEEDS PO) Take 2,000 mg by mouth daily.     furosemide (LASIX) 40 MG tablet Take 40 mg by mouth  daily before supper.     gabapentin (NEURONTIN) 300 MG capsule Take 2 capsules by mouth 3 (three) times daily.     HYDROmorphone (DILAUDID) 2 MG tablet   0  loratadine (CLARITIN) 10 MG tablet Take 10 mg by mouth daily as needed for allergies.     methadone (DOLOPHINE) 5 MG tablet Take 5 mg by mouth every 8 (eight) hours.      Multiple Vitamins-Minerals (MULTIVITAMIN PO) Take by mouth daily.     Multiple Vitamins-Minerals (PRESERVISION AREDS PO) Take by mouth.     nystatin-triamcinolone ointment (MYCOLOG) APPLY TO THE AFFECTED AREA(S) TWICE DAILY FOR UP TO FIVE DAYS 30 g 0   senna (SENOKOT) 8.6 MG tablet Take 2-3 tablets by mouth 2 (two) times daily. Take two tablets in the morning and three tablets in the evening.     SYNTHROID 75 MCG tablet   4   tiZANidine (ZANAFLEX) 4 MG tablet Take 1 tablet by mouth 3 (three) times daily as needed.     No current facility-administered medications for this visit.    Family History  Problem Relation Age of Onset   Hyperlipidemia Father    Hypertension Father    Heart attack Father 18       deceased   Heart attack Brother 68   Breast cancer Mother    Colon cancer Neg Hx    Esophageal cancer Neg Hx    Stomach cancer Neg Hx    Rectal cancer Neg Hx     Review of Systems  Constitutional: Negative.   Genitourinary: Negative.     Exam:   BP (!) 122/58 (BP Location: Left Arm, Patient Position: Sitting, Cuff Size: Large)   Pulse 99   Ht '5\' 1"'$  (1.549 m) Comment: Reported  Wt 197 lb 9.6 oz (89.6 kg)   LMP 12/06/2000   BMI 37.34 kg/m   Height: '5\' 1"'$  (154.9 cm) (Reported)  General appearance: alert, cooperative and appears stated age Breasts: normal appearance, no masses or tenderness Abdomen: soft, non-tender; bowel sounds normal; no masses,  no organomegaly Lymph nodes: Cervical, supraclavicular, and axillary nodes normal.  No abnormal inguinal nodes palpated Neurologic: Grossly normal  Pelvic: External genitalia:  no lesions               Urethra:  normal appearing urethra with no masses, tenderness or lesions              Bartholins and Skenes: normal                 Vagina: normal appearing vagina with atrophic changes and no discharge, no lesions              Cervix: absent              Pap taken: No. Bimanual Exam:  Uterus:  uterus absent              Adnexa: no mass, fullness, tenderness               Rectovaginal: Confirms               Anus:  normal sphincter tone, no lesions  Chaperone, Octaviano Batty, CMA, was present for exam.  Assessment/Plan: 1. Encntr for gyn exam (general) (routine) w abnormal findings - Pap smear not indicated - Mammogram 12/2021.  Pt states she is going to schedule it this year. - Colonoscopy 2016 - Bone mineral density 03/2022 - lab work done with PCP, Dr. Sharlett Iles - vaccines reviewed/updated  2. S/P TAH (total abdominal hysterectomy)  3. Family history of breast cancer in first degree relative  4. Rectocele/enterocele - stable on exam

## 2023-02-14 ENCOUNTER — Other Ambulatory Visit: Payer: Self-pay | Admitting: Obstetrics & Gynecology

## 2023-02-14 DIAGNOSIS — Z1231 Encounter for screening mammogram for malignant neoplasm of breast: Secondary | ICD-10-CM

## 2023-02-18 DIAGNOSIS — H35433 Paving stone degeneration of retina, bilateral: Secondary | ICD-10-CM | POA: Diagnosis not present

## 2023-02-18 DIAGNOSIS — H353221 Exudative age-related macular degeneration, left eye, with active choroidal neovascularization: Secondary | ICD-10-CM | POA: Diagnosis not present

## 2023-02-18 DIAGNOSIS — H43813 Vitreous degeneration, bilateral: Secondary | ICD-10-CM | POA: Diagnosis not present

## 2023-02-18 DIAGNOSIS — H35033 Hypertensive retinopathy, bilateral: Secondary | ICD-10-CM | POA: Diagnosis not present

## 2023-02-18 DIAGNOSIS — H353212 Exudative age-related macular degeneration, right eye, with inactive choroidal neovascularization: Secondary | ICD-10-CM | POA: Diagnosis not present

## 2023-03-14 DIAGNOSIS — E039 Hypothyroidism, unspecified: Secondary | ICD-10-CM | POA: Diagnosis not present

## 2023-03-14 DIAGNOSIS — E785 Hyperlipidemia, unspecified: Secondary | ICD-10-CM | POA: Diagnosis not present

## 2023-03-14 DIAGNOSIS — I10 Essential (primary) hypertension: Secondary | ICD-10-CM | POA: Diagnosis not present

## 2023-03-22 ENCOUNTER — Ambulatory Visit: Payer: Medicare Other | Admitting: Family Medicine

## 2023-03-22 DIAGNOSIS — Z0000 Encounter for general adult medical examination without abnormal findings: Secondary | ICD-10-CM | POA: Diagnosis not present

## 2023-03-22 DIAGNOSIS — G4733 Obstructive sleep apnea (adult) (pediatric): Secondary | ICD-10-CM | POA: Diagnosis not present

## 2023-03-22 DIAGNOSIS — M545 Low back pain, unspecified: Secondary | ICD-10-CM | POA: Diagnosis not present

## 2023-03-22 DIAGNOSIS — N1832 Chronic kidney disease, stage 3b: Secondary | ICD-10-CM | POA: Diagnosis not present

## 2023-03-22 DIAGNOSIS — E669 Obesity, unspecified: Secondary | ICD-10-CM | POA: Diagnosis not present

## 2023-03-22 DIAGNOSIS — I10 Essential (primary) hypertension: Secondary | ICD-10-CM | POA: Diagnosis not present

## 2023-03-22 DIAGNOSIS — I129 Hypertensive chronic kidney disease with stage 1 through stage 4 chronic kidney disease, or unspecified chronic kidney disease: Secondary | ICD-10-CM | POA: Diagnosis not present

## 2023-03-22 DIAGNOSIS — E785 Hyperlipidemia, unspecified: Secondary | ICD-10-CM | POA: Diagnosis not present

## 2023-03-22 DIAGNOSIS — E039 Hypothyroidism, unspecified: Secondary | ICD-10-CM | POA: Diagnosis not present

## 2023-03-23 ENCOUNTER — Ambulatory Visit: Payer: Medicare Other | Admitting: Family Medicine

## 2023-03-23 DIAGNOSIS — G894 Chronic pain syndrome: Secondary | ICD-10-CM | POA: Diagnosis not present

## 2023-03-23 DIAGNOSIS — Z79891 Long term (current) use of opiate analgesic: Secondary | ICD-10-CM | POA: Diagnosis not present

## 2023-03-23 DIAGNOSIS — M961 Postlaminectomy syndrome, not elsewhere classified: Secondary | ICD-10-CM | POA: Diagnosis not present

## 2023-03-23 DIAGNOSIS — M47818 Spondylosis without myelopathy or radiculopathy, sacral and sacrococcygeal region: Secondary | ICD-10-CM | POA: Diagnosis not present

## 2023-03-29 NOTE — Progress Notes (Unsigned)
[name] [address] [place] [address] Phone: [phone] Subjective:   [name] [name], am serving as a scribe for Dr. Antoine Primas.  I'm seeing this patient by the request  of:  Garlan Fillers, MD  CC: back pain follow up   [id number]  01/17/2023 Arthritic changes in multiple areas. Discussed with patient again at great length of different treatment options. Patient last time was found to have hypocalcemia that has resolved at the moment and likely secondary to the furosemide. Patient though does have elevated uric acid which I do think could be contributing to some of the pain and started on allopurinol. Warned of potential side effects including rash and to discontinue if this does occur. We discussed with patient that I do think that this will be helpful. Patient will try the 100 mg but we do have room to increase if necessary. Follow-up with me again in 2 months. Total time with patient 32 minutes with review of laboratory results and outside records.   Updated 03/30/2023 [name] [name] is a 82 y.o. female coming in with complaint of back pain. When doing certain activities aggravates back a lot. Can always feel the pain, but not getting worse.  Last exam started allopurinol     Past Medical History:  Diagnosis Date   Acquired left flat foot    Acquired leg length discrepancy    Allergic urticaria    Allergy    Anemia    took iron for a while   Arthritis    lower back, knees   Blood transfusion without reported diagnosis    Cataract    Chronic fatigue syndrome    Chronic pain    lower back   Female bladder prolapse    Fracture of metatarsal bone    w nonunion    Gout    Hallux rigidus    History of measles    History of mumps    History of rubella    Hyperlipidemia    Hypertension    Hypothyroidism    Imbalance    Macular degeneration    wet- right eye  dry- left eye   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HCC)     Metatarsalgia of left foot    Missed ab    x 6 - 2 surgery, 4 resolved on it's own   Neuromuscular disorder (HCC)    , left foot has some numbeness- uses cane   Numbness    outer edge of heel left foot    Osteopenia    Pneumonia    PONV (postoperative nausea and vomiting)    states she had scop patch in past,states she sets off alarms when she is waking up, she "forgets to breathe"    Seasonal allergies    Shingles    Tinnitus    Trigger middle finger of left hand    Ulcer    hx   Urinary incontinence    Past Surgical History:  Procedure Laterality Date   ABDOMINAL HYSTERECTOMY Bilateral 10/07/2014   Procedure: TOTAL ABDOMINAL HYSTERECTOMY WITH BILATERAL SALPINGO OOPHORECTOMY AND HALBANS CULDOELASTY ;  Surgeon: Annamaria Boots, MD;  Location: [facility];  Service: Gynecology;  Laterality: Bilateral;  Miller primary/Silva assist  4 hours total   ANTERIOR AND POSTERIOR REPAIR N/A 10/07/2014   Procedure: ANTERIOR (CYSTOCELE) AND POSTERIOR REPAIR (RECTOCELE);  Surgeon: Annamaria Boots, MD;  Location: [facility];  Service: Gynecology;  Laterality: N/A;   APPENDECTOMY  BACK SURGERY  09,00,10,99, 6/14,9/14   x6 surgeries on back -Spinal Fusin L4-L5, Lumbar Fusion L1-S1   BLADDER SUSPENSION N/A 10/07/2014   Procedure: TRANSVAGINAL TAPE (TVT) EXACT PROCEDURE;  Surgeon: Annamaria Boots, MD;  Location: [facility];  Service: Gynecology;  Laterality: N/A;   BREAST EXCISIONAL BIOPSY Left 1998   benign   BREAST SURGERY     left cyst- benign   BUNIONECTOMY  03/02/11 and 11/04/11   left foot x2   CARPAL TUNNEL RELEASE     both   CATARACT EXTRACTION Left 08/03/2019   CATARACT EXTRACTION Right 08/03/2019   CYSTOSCOPY N/A 10/07/2014   Procedure: CYSTOSCOPY;  Surgeon: Annamaria Boots, MD;  Location: [facility];  Service: Gynecology;  Laterality: N/A;   DILATION AND CURETTAGE OF UTERUS     x2   Eye injections Left    HAMMER TOE SURGERY  4/12   lt foot-2-3   I&D and spina abscess L4-5      JOINT REPLACEMENT     R- TKA- 2013   KNEE ARTHROSCOPY  2007   both   LUMBAR LAMINECTOMY  05/1999   L4-L5   METATARSAL OSTEOTOMY  3/12   lt   ORIF TOE FRACTURE  11/04/2011   Procedure: OPEN REDUCTION INTERNAL FIXATION (ORIF) METATARSAL (TOE) FRACTURE;  Surgeon: Toni Arthurs, MD;  Location: [facility];  Service: Orthopedics;  Laterality: Left;  left revision orif 1st metatarsal with autograft from left calcaneous and left 2nd-3rd metatarsal weil osteotomy   osteoma  12   forehead x2   OTHER SURGICAL HISTORY  05/08/13   hardware removed from left foot   removal of two osteomas     on forehead-2008   STERIOD INJECTION Left 11/04/2015   Procedure: STEROID INJECTION;  Surgeon: Ollen Gross, MD;  Location: [facility];  Service: Orthopedics;  Laterality: Left;   THUMB FUSION  2008   rt   TOTAL KNEE ARTHROPLASTY  08/29/2012   Procedure: TOTAL KNEE ARTHROPLASTY;  Surgeon: Velna Ochs, MD;  Location: [facility];  Service: Orthopedics;  Laterality: Right;   TOTAL KNEE ARTHROPLASTY Left 05/17/2016   Procedure: LEFT TOTAL KNEE ARTHROPLASTY;  Surgeon: Ollen Gross, MD;  Location: [facility];  Service: Orthopedics;  Laterality: Left;   TOTAL KNEE ARTHROPLASTY Right    TOTAL KNEE REVISION Right 11/04/2015   Procedure: TOTAL KNEE ARTHROPLASTY REVISION;  Surgeon: Ollen Gross, MD;  Location: [facility];  Service: Orthopedics;  Laterality: Right;   TOTAL SHOULDER ARTHROPLASTY Right 05/09/2014   Procedure: TOTAL SHOULDER ARTHROPLASTY;  Surgeon: Mable Paris, MD;  Location: [facility];  Service: Orthopedics;  Laterality: Right;  Right shoulder arthroplasty   TUBAL LIGATION     Social History   Socioeconomic History   Marital status: Married    Spouse name: Not on file   Number of children: Not on file   Years of education: Not on file   Highest education level: Not on file  Occupational History   Not on file  Tobacco Use   Smoking status: Never   Smokeless tobacco: Never  Vaping Use    Vaping Use: Never used  Substance and Sexual Activity   Alcohol use: Yes    Alcohol/week: 1.0 standard drink of alcohol    Types: 1 Standard drinks or equivalent per week    Comment: glass of wine occas   Drug use: No   Sexual activity: Yes    Partners: Male    Birth control/protection: Post-menopausal    Comment: TAH/BSO  Other Topics Concern   Not on file  Social History Narrative   Not on file   Social Determinants of Health   Financial Resource Strain: Not on file  Food Insecurity: Not on file  Transportation Needs: Not on file  Physical Activity: Not on file  Stress: Not on file  Social Connections: Not on file   Allergies  Allergen Reactions   Oxycodone Hives     pink pill, allergic only to pink pills, if not pink she can take   Flexeril [Cyclobenzaprine] Hives   Red Dye Hives and Swelling   Adhesive [Tape] Other (See Comments)    Blisters - Tolerates paper tape   Ciprofloxacin Hives   Crab [Shellfish Allergy] Swelling and Other (See Comments)    Lip swellig with artificial crab meat   Etodolac    Povidone Iodine Hives, Swelling and Dermatitis    Betadine   Pravastatin Hives and Other (See Comments)    Tolerates atorvastatin   Purell Instant Hand [Homeopathic Products]    Tylenol [Acetaminophen] Hives and Other (See Comments)    Makes her feel funny. Cannot take Tylenol in combination products (Percocet, Vicodin), but can take it alone Tolerates opiates individually except with red dye   Latex Hives and Other (See Comments)    Blisters   Lisinopril Hives   Methocarbamol Hives   Morphine And Related Other (See Comments)    Does not provide any pain relief   Sulfa Antibiotics Hives   Family History  Problem Relation Age of Onset   Hyperlipidemia Father    Hypertension Father    Heart attack Father 47       deceased   Heart attack Brother 27   Breast cancer Mother    Colon cancer Neg Hx    Esophageal cancer Neg Hx    Stomach cancer Neg Hx     Rectal cancer Neg Hx     Current Outpatient Medications (Endocrine & Metabolic):    SYNTHROID 75 MCG tablet,   Current Outpatient Medications (Cardiovascular):    atorvastatin (LIPITOR) 20 MG tablet, Take 20 mg by mouth at bedtime.    carvedilol (COREG) 6.25 MG tablet, Take 6.25 mg by mouth 2 (two) times daily with a meal.    furosemide (LASIX) 40 MG tablet, Take 40 mg by mouth daily before supper.  Current Outpatient Medications (Respiratory):    loratadine (CLARITIN) 10 MG tablet, Take 10 mg by mouth daily as needed for allergies.  Current Outpatient Medications (Analgesics):    allopurinol (ZYLOPRIM) 100 MG tablet, Take 1 tablet (100 mg total) by mouth daily.   HYDROmorphone (DILAUDID) 2 MG tablet,    methadone (DOLOPHINE) 5 MG tablet, Take 5 mg by mouth every 8 (eight) hours.    Current Outpatient Medications (Other):    Ascorbic Acid (VITAMIN C PO), Take 1,000 mg by mouth daily.   BIOTIN PO, Take 5,000 mg by mouth 2 (two) times a week.   Calcium-Magnesium-Vitamin D (CALCIUM 1200+D3 PO), Take 1 tablet by mouth 2 (two) times daily.   Flaxseed, Linseed, (FLAX SEEDS PO), Take 2,000 mg by mouth daily.   gabapentin (NEURONTIN) 300 MG capsule, Take 2 capsules by mouth 3 (three) times daily.   Multiple Vitamins-Minerals (MULTIVITAMIN PO), Take by mouth daily.   Multiple Vitamins-Minerals (PRESERVISION AREDS PO), Take by mouth.   nystatin-triamcinolone ointment (MYCOLOG), APPLY TO THE AFFECTED AREA(S) TWICE DAILY FOR UP TO FIVE DAYS   senna (SENOKOT) 8.6 MG tablet, Take 2-3 tablets by mouth 2 (two) times  daily. Take two tablets in the morning and three tablets in the evening.   tiZANidine (ZANAFLEX) 4 MG tablet, Take 1 tablet by mouth 3 (three) times daily as needed.   Reviewed prior external information including notes and imaging from  primary care provider As well as notes that were available from care everywhere and other healthcare systems.  Past medical history, social,  surgical and family history all reviewed in electronic medical record.  No pertanent information unless stated regarding to the chief complaint.   Review of Systems:  No headache, visual changes, nausea, vomiting, diarrhea, constipation, dizziness, abdominal pain, skin rash, fevers, chills, night sweats, weight loss, swollen lymph nodes joint swelling, chest pain, shortness of breath, mood changes. POSITIVE muscle aches, body aches  Objective  Blood pressure 122/64, pulse 72, height  (1.549 m), weight 200 lb (90.7 kg), last menstrual period 12/06/2000, SpO2 95 %.   General: No apparent distress alert and oriented x3 mood and affect normal, dressed appropriately.  HEENT: Pupils equal, extraocular movements intact  Respiratory: Patient's speak in full sentences and does not appear short of breath  Antalgic gait noted Patient does ambulate with the aid of a cane. Patient does have some arthritic changes noted in the lower spine    Impression and Recommendations:    The above documentation has been reviewed and is accurate and complete Judi Saa, DO

## 2023-03-30 ENCOUNTER — Ambulatory Visit (INDEPENDENT_AMBULATORY_CARE_PROVIDER_SITE_OTHER): Payer: Medicare Other | Admitting: Family Medicine

## 2023-03-30 VITALS — BP 122/64 | HR 72 | Ht 61.0 in | Wt 200.0 lb

## 2023-03-30 DIAGNOSIS — M5441 Lumbago with sciatica, right side: Secondary | ICD-10-CM

## 2023-03-30 DIAGNOSIS — G8929 Other chronic pain: Secondary | ICD-10-CM | POA: Diagnosis not present

## 2023-03-30 DIAGNOSIS — M5442 Lumbago with sciatica, left side: Secondary | ICD-10-CM

## 2023-03-30 NOTE — Assessment & Plan Note (Signed)
Chronic low back pain.  Discussed icing regimen at home exercises, which activities to do motions to avoid still.  We discussed with patient that there is the possibility of other injections if needed.  Patient is going to continue to take the allopurinol.  Has noticed that nothing has seemed to be getting worse.  Patient is able to still do certain activities like working in the yard intermittently.  Follow-up with me again in 3 to 4 months

## 2023-03-30 NOTE — Patient Instructions (Signed)
Good to see you! So glad you're doing well Lets not make big changes See you again before [place] trip Look for durable foldable walker

## 2023-04-04 ENCOUNTER — Ambulatory Visit
Admission: RE | Admit: 2023-04-04 | Discharge: 2023-04-04 | Disposition: A | Discharge status: Home / Self Care | Payer: Medicare Other | Source: Ambulatory Visit | Attending: Obstetrics & Gynecology | Admitting: Obstetrics & Gynecology

## 2023-04-04 DIAGNOSIS — Z1231 Encounter for screening mammogram for malignant neoplasm of breast: Secondary | ICD-10-CM

## 2023-04-11 DIAGNOSIS — H353132 Nonexudative age-related macular degeneration, bilateral, intermediate dry stage: Secondary | ICD-10-CM | POA: Diagnosis not present

## 2023-04-11 DIAGNOSIS — Z961 Presence of intraocular lens: Secondary | ICD-10-CM | POA: Diagnosis not present

## 2023-04-11 DIAGNOSIS — H01029 Squamous blepharitis unspecified eye, unspecified eyelid: Secondary | ICD-10-CM | POA: Diagnosis not present

## 2023-04-29 DIAGNOSIS — H353221 Exudative age-related macular degeneration, left eye, with active choroidal neovascularization: Secondary | ICD-10-CM | POA: Diagnosis not present

## 2023-04-29 DIAGNOSIS — H43813 Vitreous degeneration, bilateral: Secondary | ICD-10-CM | POA: Diagnosis not present

## 2023-04-29 DIAGNOSIS — H35433 Paving stone degeneration of retina, bilateral: Secondary | ICD-10-CM | POA: Diagnosis not present

## 2023-04-29 DIAGNOSIS — H35033 Hypertensive retinopathy, bilateral: Secondary | ICD-10-CM | POA: Diagnosis not present

## 2023-04-29 DIAGNOSIS — H353212 Exudative age-related macular degeneration, right eye, with inactive choroidal neovascularization: Secondary | ICD-10-CM | POA: Diagnosis not present

## 2023-05-18 DIAGNOSIS — M961 Postlaminectomy syndrome, not elsewhere classified: Secondary | ICD-10-CM | POA: Diagnosis not present

## 2023-05-18 DIAGNOSIS — M47818 Spondylosis without myelopathy or radiculopathy, sacral and sacrococcygeal region: Secondary | ICD-10-CM | POA: Diagnosis not present

## 2023-05-18 DIAGNOSIS — G894 Chronic pain syndrome: Secondary | ICD-10-CM | POA: Diagnosis not present

## 2023-05-18 DIAGNOSIS — Z79891 Long term (current) use of opiate analgesic: Secondary | ICD-10-CM | POA: Diagnosis not present

## 2023-05-27 ENCOUNTER — Other Ambulatory Visit: Payer: Self-pay | Admitting: Obstetrics & Gynecology

## 2023-05-30 DIAGNOSIS — H16223 Keratoconjunctivitis sicca, not specified as Sjogren's, bilateral: Secondary | ICD-10-CM | POA: Diagnosis not present

## 2023-07-15 DIAGNOSIS — H35433 Paving stone degeneration of retina, bilateral: Secondary | ICD-10-CM | POA: Diagnosis not present

## 2023-07-15 DIAGNOSIS — H43813 Vitreous degeneration, bilateral: Secondary | ICD-10-CM | POA: Diagnosis not present

## 2023-07-15 DIAGNOSIS — H35033 Hypertensive retinopathy, bilateral: Secondary | ICD-10-CM | POA: Diagnosis not present

## 2023-07-15 DIAGNOSIS — H353212 Exudative age-related macular degeneration, right eye, with inactive choroidal neovascularization: Secondary | ICD-10-CM | POA: Diagnosis not present

## 2023-07-15 DIAGNOSIS — H353221 Exudative age-related macular degeneration, left eye, with active choroidal neovascularization: Secondary | ICD-10-CM | POA: Diagnosis not present

## 2023-07-18 DIAGNOSIS — G894 Chronic pain syndrome: Secondary | ICD-10-CM | POA: Diagnosis not present

## 2023-07-18 DIAGNOSIS — M961 Postlaminectomy syndrome, not elsewhere classified: Secondary | ICD-10-CM | POA: Diagnosis not present

## 2023-07-18 DIAGNOSIS — Z79891 Long term (current) use of opiate analgesic: Secondary | ICD-10-CM | POA: Diagnosis not present

## 2023-07-18 DIAGNOSIS — M47818 Spondylosis without myelopathy or radiculopathy, sacral and sacrococcygeal region: Secondary | ICD-10-CM | POA: Diagnosis not present

## 2023-08-03 ENCOUNTER — Ambulatory Visit: Payer: Medicare Other | Admitting: Family Medicine

## 2023-08-06 DIAGNOSIS — Z23 Encounter for immunization: Secondary | ICD-10-CM | POA: Diagnosis not present

## 2023-08-16 DIAGNOSIS — Z981 Arthrodesis status: Secondary | ICD-10-CM | POA: Diagnosis not present

## 2023-08-16 DIAGNOSIS — M40294 Other kyphosis, thoracic region: Secondary | ICD-10-CM | POA: Diagnosis not present

## 2023-09-12 DIAGNOSIS — M47818 Spondylosis without myelopathy or radiculopathy, sacral and sacrococcygeal region: Secondary | ICD-10-CM | POA: Diagnosis not present

## 2023-09-12 DIAGNOSIS — G894 Chronic pain syndrome: Secondary | ICD-10-CM | POA: Diagnosis not present

## 2023-09-12 DIAGNOSIS — M961 Postlaminectomy syndrome, not elsewhere classified: Secondary | ICD-10-CM | POA: Diagnosis not present

## 2023-09-12 DIAGNOSIS — Z79891 Long term (current) use of opiate analgesic: Secondary | ICD-10-CM | POA: Diagnosis not present

## 2023-09-22 DIAGNOSIS — U071 COVID-19: Secondary | ICD-10-CM | POA: Diagnosis not present

## 2023-09-22 DIAGNOSIS — R0981 Nasal congestion: Secondary | ICD-10-CM | POA: Diagnosis not present

## 2023-09-22 DIAGNOSIS — R6883 Chills (without fever): Secondary | ICD-10-CM | POA: Diagnosis not present

## 2023-09-22 DIAGNOSIS — Z1152 Encounter for screening for COVID-19: Secondary | ICD-10-CM | POA: Diagnosis not present

## 2023-09-22 DIAGNOSIS — R5383 Other fatigue: Secondary | ICD-10-CM | POA: Diagnosis not present

## 2023-09-22 DIAGNOSIS — R059 Cough, unspecified: Secondary | ICD-10-CM | POA: Diagnosis not present

## 2023-09-22 DIAGNOSIS — R52 Pain, unspecified: Secondary | ICD-10-CM | POA: Diagnosis not present

## 2023-10-14 DIAGNOSIS — H35033 Hypertensive retinopathy, bilateral: Secondary | ICD-10-CM | POA: Diagnosis not present

## 2023-10-14 DIAGNOSIS — H353212 Exudative age-related macular degeneration, right eye, with inactive choroidal neovascularization: Secondary | ICD-10-CM | POA: Diagnosis not present

## 2023-10-14 DIAGNOSIS — H353221 Exudative age-related macular degeneration, left eye, with active choroidal neovascularization: Secondary | ICD-10-CM | POA: Diagnosis not present

## 2023-10-14 DIAGNOSIS — H35433 Paving stone degeneration of retina, bilateral: Secondary | ICD-10-CM | POA: Diagnosis not present

## 2023-10-14 DIAGNOSIS — H43813 Vitreous degeneration, bilateral: Secondary | ICD-10-CM | POA: Diagnosis not present

## 2023-11-09 DIAGNOSIS — M961 Postlaminectomy syndrome, not elsewhere classified: Secondary | ICD-10-CM | POA: Diagnosis not present

## 2023-11-09 DIAGNOSIS — Z79891 Long term (current) use of opiate analgesic: Secondary | ICD-10-CM | POA: Diagnosis not present

## 2023-11-09 DIAGNOSIS — G894 Chronic pain syndrome: Secondary | ICD-10-CM | POA: Diagnosis not present

## 2023-11-09 DIAGNOSIS — M47818 Spondylosis without myelopathy or radiculopathy, sacral and sacrococcygeal region: Secondary | ICD-10-CM | POA: Diagnosis not present

## 2023-12-08 DIAGNOSIS — Z23 Encounter for immunization: Secondary | ICD-10-CM | POA: Diagnosis not present

## 2024-01-05 DIAGNOSIS — G894 Chronic pain syndrome: Secondary | ICD-10-CM | POA: Diagnosis not present

## 2024-01-05 DIAGNOSIS — M961 Postlaminectomy syndrome, not elsewhere classified: Secondary | ICD-10-CM | POA: Diagnosis not present

## 2024-01-05 DIAGNOSIS — M47818 Spondylosis without myelopathy or radiculopathy, sacral and sacrococcygeal region: Secondary | ICD-10-CM | POA: Diagnosis not present

## 2024-01-05 DIAGNOSIS — Z79891 Long term (current) use of opiate analgesic: Secondary | ICD-10-CM | POA: Diagnosis not present

## 2024-01-12 DIAGNOSIS — Z23 Encounter for immunization: Secondary | ICD-10-CM | POA: Diagnosis not present

## 2024-01-18 DIAGNOSIS — H35433 Paving stone degeneration of retina, bilateral: Secondary | ICD-10-CM | POA: Diagnosis not present

## 2024-01-18 DIAGNOSIS — H43813 Vitreous degeneration, bilateral: Secondary | ICD-10-CM | POA: Diagnosis not present

## 2024-01-18 DIAGNOSIS — H353211 Exudative age-related macular degeneration, right eye, with active choroidal neovascularization: Secondary | ICD-10-CM | POA: Diagnosis not present

## 2024-01-18 DIAGNOSIS — H353222 Exudative age-related macular degeneration, left eye, with inactive choroidal neovascularization: Secondary | ICD-10-CM | POA: Diagnosis not present

## 2024-01-18 DIAGNOSIS — H35033 Hypertensive retinopathy, bilateral: Secondary | ICD-10-CM | POA: Diagnosis not present

## 2024-02-15 DIAGNOSIS — H353211 Exudative age-related macular degeneration, right eye, with active choroidal neovascularization: Secondary | ICD-10-CM | POA: Diagnosis not present

## 2024-02-15 DIAGNOSIS — H43813 Vitreous degeneration, bilateral: Secondary | ICD-10-CM | POA: Diagnosis not present

## 2024-02-15 DIAGNOSIS — H35033 Hypertensive retinopathy, bilateral: Secondary | ICD-10-CM | POA: Diagnosis not present

## 2024-02-15 DIAGNOSIS — H35433 Paving stone degeneration of retina, bilateral: Secondary | ICD-10-CM | POA: Diagnosis not present

## 2024-02-15 DIAGNOSIS — H353222 Exudative age-related macular degeneration, left eye, with inactive choroidal neovascularization: Secondary | ICD-10-CM | POA: Diagnosis not present

## 2024-02-27 ENCOUNTER — Ambulatory Visit (HOSPITAL_BASED_OUTPATIENT_CLINIC_OR_DEPARTMENT_OTHER): Payer: Medicare Other | Admitting: Obstetrics & Gynecology

## 2024-02-27 ENCOUNTER — Encounter (HOSPITAL_BASED_OUTPATIENT_CLINIC_OR_DEPARTMENT_OTHER): Payer: Self-pay | Admitting: Obstetrics & Gynecology

## 2024-02-27 VITALS — BP 115/61 | HR 84 | Ht 61.0 in | Wt 197.2 lb

## 2024-02-27 DIAGNOSIS — Z23 Encounter for immunization: Secondary | ICD-10-CM

## 2024-02-27 DIAGNOSIS — Z803 Family history of malignant neoplasm of breast: Secondary | ICD-10-CM

## 2024-02-27 DIAGNOSIS — B372 Candidiasis of skin and nail: Secondary | ICD-10-CM | POA: Diagnosis not present

## 2024-02-27 DIAGNOSIS — N8111 Cystocele, midline: Secondary | ICD-10-CM

## 2024-02-27 DIAGNOSIS — Z01419 Encounter for gynecological examination (general) (routine) without abnormal findings: Secondary | ICD-10-CM | POA: Diagnosis not present

## 2024-02-27 DIAGNOSIS — Z01411 Encounter for gynecological examination (general) (routine) with abnormal findings: Secondary | ICD-10-CM

## 2024-02-27 DIAGNOSIS — Z90710 Acquired absence of both cervix and uterus: Secondary | ICD-10-CM | POA: Diagnosis not present

## 2024-02-27 MED ORDER — NYSTATIN-TRIAMCINOLONE 100000-0.1 UNIT/GM-% EX OINT: TOPICAL_OINTMENT | CUTANEOUS | 1 refills | Status: AC

## 2024-02-27 NOTE — Progress Notes (Addendum)
 Breast and Pelvic Exam Patient name: [name] [name] MRN [id number]  Date of birth: [birth year] Chief Complaint:   Breast and pelvic exam.  Needs Tdap today.    History of Present Illness:   [name] [name] is a 83 y.o. [phone] Caucasian female being seen today for a routine annual exam.  Denies vaginal bleeding.  Urinary function is pretty stable right now.  Doesn't have great control if waits to long.   59th wedding anniversary is coming up.     Patient's last menstrual period was 12/06/2000.   Last pap 07/03/20214. Results were: NILM w/ HRHPV not done. H/O abnormal pap: no Last mammogram: 04/04/2023. Results were: normal. Family h/o breast cancer: yes : mother  Last colonoscopy: 04/30/2015. Results were: normal. Family h/o colorectal cancer: no     02/27/2024    9:59 AM 02/10/2023    2:40 PM 02/03/2022    3:22 PM 02/02/2021    2:07 PM  Depression screen PHQ 2/9  Decreased Interest 0 0 0 0  Down, Depressed, Hopeless 0 0 0 0  PHQ - 2 Score 0 0 0 0    Review of Systems:   Pertinent items are noted in HPI  Denies any vaginal bleeding, vaginal discharge, urinary or bowel changes.  Pertinent History Reviewed:  Reviewed past medical,surgical, social and family history.  Reviewed problem list, medications and allergies. Physical Assessment:   Vitals:   02/27/24 0953  BP: 115/61  Pulse: 84  Weight: 197 lb 3.2 oz (89.4 kg)  Height: 5\' 1"  (1.549 m)  Body mass index is 37.26 kg/m.        Physical Examination:   General appearance - well appearing, and in no distress  Mental status - alert, oriented to person, place, and time  Psych:  She has a normal mood and affect  Skin - warm and dry, normal color, no suspicious lesions noted  Chest - effort normal, all lung fields clear to auscultation bilaterally  Heart - normal rate and regular rhythm  Neck:  midline trachea, no thyromegaly or nodules  Breasts - breasts appear normal, no suspicious masses, no skin or nipple changes or  axillary  nodes  Abdomen - soft, nontender, nondistended, no masses or organomegaly  Pelvic - VULVA: normal appearing vulva with no masses, tenderness or lesions    VAGINA: cystocele with vaginal vault prolapse  CERVIX: surgically absent  Thin prep pap is not indicated  UTERUS: surgically absent  ADNEXA: No adnexal masses or tenderness noted.  Rectal - normal rectal, good sphincter tone, no masses felt.   Extremities:  No swelling or varicosities noted  Chaperone present for exam Assessment & Plan:  1. Encntr for gyn exam (general) (routine) w abnormal findings (Primary) - Pap smear not indicated - Mammogram 03/2023 - Colonoscopy 2016.  Follow up not indicated.     - Bone mineral density with osteopenia in 2022.  Taking calcium and Vit D.  Dosing discussed. - lab work done with PCP, Dr. Eloise Harman  2. Need for Tdap vaccination - Tdap vaccine greater than or equal to 7yo IM  3. Family history of breast cancer in first degree relative - in her mother  51. Hx of hysterectomy  5. Cystocele, midline - stable on exam  6. Candidal skin infection - nystatin-triamcinolone ointment (MYCOLOG); APPLY TO THE AFFECTED AREA(S) TWICE DAILY FOR UP TO FIVE DAYS  Dispense: 30 g; Refill: 1     Orders Placed This Encounter  Procedures   Tdap vaccine greater  than or equal to 7yo IM    Meds: No orders of the defined types were placed in this encounter.   Follow-up: Return in about 2 years (around 02/26/2026) for f/u 1-2 years.  Jerene Bears, MD 02/27/2024 10:38 AM

## 2024-02-27 NOTE — Addendum Note (Signed)
 Addended by: Jerene Bears on: 02/27/2024 11:02 AM   Modules accepted: Orders

## 2024-03-01 DIAGNOSIS — Z79891 Long term (current) use of opiate analgesic: Secondary | ICD-10-CM | POA: Diagnosis not present

## 2024-03-01 DIAGNOSIS — G894 Chronic pain syndrome: Secondary | ICD-10-CM | POA: Diagnosis not present

## 2024-03-01 DIAGNOSIS — R519 Headache, unspecified: Secondary | ICD-10-CM | POA: Diagnosis not present

## 2024-03-01 DIAGNOSIS — M961 Postlaminectomy syndrome, not elsewhere classified: Secondary | ICD-10-CM | POA: Diagnosis not present

## 2024-03-14 DIAGNOSIS — H353211 Exudative age-related macular degeneration, right eye, with active choroidal neovascularization: Secondary | ICD-10-CM | POA: Diagnosis not present

## 2024-03-14 DIAGNOSIS — H353222 Exudative age-related macular degeneration, left eye, with inactive choroidal neovascularization: Secondary | ICD-10-CM | POA: Diagnosis not present

## 2024-03-14 DIAGNOSIS — H35033 Hypertensive retinopathy, bilateral: Secondary | ICD-10-CM | POA: Diagnosis not present

## 2024-03-14 DIAGNOSIS — H43813 Vitreous degeneration, bilateral: Secondary | ICD-10-CM | POA: Diagnosis not present

## 2024-03-14 DIAGNOSIS — H35433 Paving stone degeneration of retina, bilateral: Secondary | ICD-10-CM | POA: Diagnosis not present

## 2024-03-29 DIAGNOSIS — E039 Hypothyroidism, unspecified: Secondary | ICD-10-CM | POA: Diagnosis not present

## 2024-03-29 DIAGNOSIS — E785 Hyperlipidemia, unspecified: Secondary | ICD-10-CM | POA: Diagnosis not present

## 2024-03-29 DIAGNOSIS — I10 Essential (primary) hypertension: Secondary | ICD-10-CM | POA: Diagnosis not present

## 2024-04-05 DIAGNOSIS — Z1339 Encounter for screening examination for other mental health and behavioral disorders: Secondary | ICD-10-CM | POA: Diagnosis not present

## 2024-04-05 DIAGNOSIS — M545 Low back pain, unspecified: Secondary | ICD-10-CM | POA: Diagnosis not present

## 2024-04-05 DIAGNOSIS — E785 Hyperlipidemia, unspecified: Secondary | ICD-10-CM | POA: Diagnosis not present

## 2024-04-05 DIAGNOSIS — I129 Hypertensive chronic kidney disease with stage 1 through stage 4 chronic kidney disease, or unspecified chronic kidney disease: Secondary | ICD-10-CM | POA: Diagnosis not present

## 2024-04-05 DIAGNOSIS — G4733 Obstructive sleep apnea (adult) (pediatric): Secondary | ICD-10-CM | POA: Diagnosis not present

## 2024-04-05 DIAGNOSIS — I872 Venous insufficiency (chronic) (peripheral): Secondary | ICD-10-CM | POA: Diagnosis not present

## 2024-04-05 DIAGNOSIS — E039 Hypothyroidism, unspecified: Secondary | ICD-10-CM | POA: Diagnosis not present

## 2024-04-05 DIAGNOSIS — R82998 Other abnormal findings in urine: Secondary | ICD-10-CM | POA: Diagnosis not present

## 2024-04-05 DIAGNOSIS — M25561 Pain in right knee: Secondary | ICD-10-CM | POA: Diagnosis not present

## 2024-04-05 DIAGNOSIS — Z0000 Encounter for general adult medical examination without abnormal findings: Secondary | ICD-10-CM | POA: Diagnosis not present

## 2024-04-05 DIAGNOSIS — N1831 Chronic kidney disease, stage 3a: Secondary | ICD-10-CM | POA: Diagnosis not present

## 2024-04-05 DIAGNOSIS — J301 Allergic rhinitis due to pollen: Secondary | ICD-10-CM | POA: Diagnosis not present

## 2024-04-05 DIAGNOSIS — Z1331 Encounter for screening for depression: Secondary | ICD-10-CM | POA: Diagnosis not present

## 2024-05-09 DIAGNOSIS — H43813 Vitreous degeneration, bilateral: Secondary | ICD-10-CM | POA: Diagnosis not present

## 2024-05-09 DIAGNOSIS — H35433 Paving stone degeneration of retina, bilateral: Secondary | ICD-10-CM | POA: Diagnosis not present

## 2024-05-09 DIAGNOSIS — H35033 Hypertensive retinopathy, bilateral: Secondary | ICD-10-CM | POA: Diagnosis not present

## 2024-05-09 DIAGNOSIS — H353211 Exudative age-related macular degeneration, right eye, with active choroidal neovascularization: Secondary | ICD-10-CM | POA: Diagnosis not present

## 2024-05-09 DIAGNOSIS — H353222 Exudative age-related macular degeneration, left eye, with inactive choroidal neovascularization: Secondary | ICD-10-CM | POA: Diagnosis not present

## 2024-05-16 ENCOUNTER — Other Ambulatory Visit: Payer: Self-pay | Admitting: Obstetrics & Gynecology

## 2024-05-16 DIAGNOSIS — Z1231 Encounter for screening mammogram for malignant neoplasm of breast: Secondary | ICD-10-CM

## 2024-05-23 DIAGNOSIS — M961 Postlaminectomy syndrome, not elsewhere classified: Secondary | ICD-10-CM | POA: Diagnosis not present

## 2024-05-23 DIAGNOSIS — G894 Chronic pain syndrome: Secondary | ICD-10-CM | POA: Diagnosis not present

## 2024-05-23 DIAGNOSIS — R519 Headache, unspecified: Secondary | ICD-10-CM | POA: Diagnosis not present

## 2024-05-23 DIAGNOSIS — Z79891 Long term (current) use of opiate analgesic: Secondary | ICD-10-CM | POA: Diagnosis not present

## 2024-05-24 ENCOUNTER — Ambulatory Visit

## 2024-05-31 ENCOUNTER — Ambulatory Visit
Admission: RE | Admit: 2024-05-31 | Discharge: 2024-05-31 | Disposition: A | Discharge status: Home / Self Care | Source: Ambulatory Visit | Attending: Obstetrics & Gynecology | Admitting: Obstetrics & Gynecology

## 2024-05-31 DIAGNOSIS — Z1231 Encounter for screening mammogram for malignant neoplasm of breast: Secondary | ICD-10-CM

## 2024-06-04 DIAGNOSIS — Z961 Presence of intraocular lens: Secondary | ICD-10-CM | POA: Diagnosis not present

## 2024-06-04 DIAGNOSIS — H16223 Keratoconjunctivitis sicca, not specified as Sjogren's, bilateral: Secondary | ICD-10-CM | POA: Diagnosis not present

## 2024-06-27 DIAGNOSIS — H353222 Exudative age-related macular degeneration, left eye, with inactive choroidal neovascularization: Secondary | ICD-10-CM | POA: Diagnosis not present

## 2024-06-27 DIAGNOSIS — H353211 Exudative age-related macular degeneration, right eye, with active choroidal neovascularization: Secondary | ICD-10-CM | POA: Diagnosis not present

## 2024-06-27 DIAGNOSIS — H35433 Paving stone degeneration of retina, bilateral: Secondary | ICD-10-CM | POA: Diagnosis not present

## 2024-06-27 DIAGNOSIS — H35033 Hypertensive retinopathy, bilateral: Secondary | ICD-10-CM | POA: Diagnosis not present

## 2024-06-27 DIAGNOSIS — H43813 Vitreous degeneration, bilateral: Secondary | ICD-10-CM | POA: Diagnosis not present

## 2024-08-13 DIAGNOSIS — M47818 Spondylosis without myelopathy or radiculopathy, sacral and sacrococcygeal region: Secondary | ICD-10-CM | POA: Diagnosis not present

## 2024-08-13 DIAGNOSIS — G894 Chronic pain syndrome: Secondary | ICD-10-CM | POA: Diagnosis not present

## 2024-08-13 DIAGNOSIS — Z79891 Long term (current) use of opiate analgesic: Secondary | ICD-10-CM | POA: Diagnosis not present

## 2024-08-13 DIAGNOSIS — M961 Postlaminectomy syndrome, not elsewhere classified: Secondary | ICD-10-CM | POA: Diagnosis not present

## 2024-08-14 DIAGNOSIS — Z23 Encounter for immunization: Secondary | ICD-10-CM | POA: Diagnosis not present

## 2024-08-21 DIAGNOSIS — H35433 Paving stone degeneration of retina, bilateral: Secondary | ICD-10-CM | POA: Diagnosis not present

## 2024-08-21 DIAGNOSIS — H43813 Vitreous degeneration, bilateral: Secondary | ICD-10-CM | POA: Diagnosis not present

## 2024-08-21 DIAGNOSIS — M47816 Spondylosis without myelopathy or radiculopathy, lumbar region: Secondary | ICD-10-CM | POA: Diagnosis not present

## 2024-08-21 DIAGNOSIS — H353222 Exudative age-related macular degeneration, left eye, with inactive choroidal neovascularization: Secondary | ICD-10-CM | POA: Diagnosis not present

## 2024-08-21 DIAGNOSIS — H353211 Exudative age-related macular degeneration, right eye, with active choroidal neovascularization: Secondary | ICD-10-CM | POA: Diagnosis not present

## 2024-08-21 DIAGNOSIS — H35033 Hypertensive retinopathy, bilateral: Secondary | ICD-10-CM | POA: Diagnosis not present

## 2024-08-21 DIAGNOSIS — Z981 Arthrodesis status: Secondary | ICD-10-CM | POA: Diagnosis not present

## 2024-10-18 DIAGNOSIS — Z79891 Long term (current) use of opiate analgesic: Secondary | ICD-10-CM | POA: Diagnosis not present

## 2024-10-18 DIAGNOSIS — M961 Postlaminectomy syndrome, not elsewhere classified: Secondary | ICD-10-CM | POA: Diagnosis not present

## 2024-10-18 DIAGNOSIS — M47818 Spondylosis without myelopathy or radiculopathy, sacral and sacrococcygeal region: Secondary | ICD-10-CM | POA: Diagnosis not present

## 2024-10-18 DIAGNOSIS — G894 Chronic pain syndrome: Secondary | ICD-10-CM | POA: Diagnosis not present

## 2024-10-21 DIAGNOSIS — Z23 Encounter for immunization: Secondary | ICD-10-CM | POA: Diagnosis not present

## 2024-10-23 DIAGNOSIS — H35033 Hypertensive retinopathy, bilateral: Secondary | ICD-10-CM | POA: Diagnosis not present

## 2024-10-23 DIAGNOSIS — H353211 Exudative age-related macular degeneration, right eye, with active choroidal neovascularization: Secondary | ICD-10-CM | POA: Diagnosis not present

## 2024-10-23 DIAGNOSIS — H43813 Vitreous degeneration, bilateral: Secondary | ICD-10-CM | POA: Diagnosis not present

## 2024-10-23 DIAGNOSIS — H35433 Paving stone degeneration of retina, bilateral: Secondary | ICD-10-CM | POA: Diagnosis not present

## 2024-10-23 DIAGNOSIS — H353222 Exudative age-related macular degeneration, left eye, with inactive choroidal neovascularization: Secondary | ICD-10-CM | POA: Diagnosis not present

## 2026-02-28 ENCOUNTER — Ambulatory Visit (HOSPITAL_BASED_OUTPATIENT_CLINIC_OR_DEPARTMENT_OTHER): Admitting: Obstetrics & Gynecology
